# Patient Record
Sex: Male | Born: 1940 | Hispanic: No | Marital: Single | Smoking: Never smoker
Health system: Southern US, Community
[De-identification: ages and names within clinical notes are randomized; demographics above are authoritative.]

## PROBLEM LIST (undated history)

## (undated) DIAGNOSIS — K219 Gastro-esophageal reflux disease without esophagitis: Secondary | ICD-10-CM

## (undated) DIAGNOSIS — D649 Anemia, unspecified: Secondary | ICD-10-CM

## (undated) DIAGNOSIS — N289 Disorder of kidney and ureter, unspecified: Secondary | ICD-10-CM

## (undated) DIAGNOSIS — I499 Cardiac arrhythmia, unspecified: Secondary | ICD-10-CM

## (undated) DIAGNOSIS — C801 Malignant (primary) neoplasm, unspecified: Secondary | ICD-10-CM

## (undated) DIAGNOSIS — N319 Neuromuscular dysfunction of bladder, unspecified: Secondary | ICD-10-CM

## (undated) DIAGNOSIS — N189 Chronic kidney disease, unspecified: Secondary | ICD-10-CM

## (undated) HISTORY — PX: SUPRAPUBIC CATHETER PLACEMENT: SHX2473

## (undated) HISTORY — PX: CYSTOURETHROSCOPY: SHX476

---

## 2008-08-20 ENCOUNTER — Ambulatory Visit (HOSPITAL_BASED_OUTPATIENT_CLINIC_OR_DEPARTMENT_OTHER): Admission: RE | Admit: 2008-08-20 | Discharge: 2008-08-20 | Payer: Self-pay | Admitting: Urology

## 2010-03-28 ENCOUNTER — Ambulatory Visit (INDEPENDENT_AMBULATORY_CARE_PROVIDER_SITE_OTHER): Payer: Medicare Other | Admitting: Urology

## 2010-03-28 DIAGNOSIS — N319 Neuromuscular dysfunction of bladder, unspecified: Secondary | ICD-10-CM

## 2010-04-28 ENCOUNTER — Ambulatory Visit (INDEPENDENT_AMBULATORY_CARE_PROVIDER_SITE_OTHER): Payer: Medicare Other | Admitting: Urology

## 2010-04-28 DIAGNOSIS — N319 Neuromuscular dysfunction of bladder, unspecified: Secondary | ICD-10-CM

## 2010-05-07 LAB — POCT I-STAT, CHEM 8
BUN: 44 mg/dL — ABNORMAL HIGH (ref 6–23)
Calcium, Ion: 1.21 mmol/L (ref 1.12–1.32)
Chloride: 116 mEq/L — ABNORMAL HIGH (ref 96–112)
Creatinine, Ser: 2.8 mg/dL — ABNORMAL HIGH (ref 0.4–1.5)
TCO2: 19 mmol/L (ref 0–100)

## 2010-06-13 NOTE — Op Note (Signed)
NAMESUNAY, CHOCK              ACCOUNT NO.:  [id number]   MEDICAL RECORD NO.:  [id number]          PATIENT TYPE:  AMB   LOCATION:  [facility]                         FACILITY:  [facility]   PHYSICIAN:  Ronald L. Rosana Hoes, M.D.  DATE OF BIRTH:  11/01/1940   DATE OF PROCEDURE:  08/17/2008  DATE OF DISCHARGE:                               OPERATIVE REPORT   PREOPERATIVE DIAGNOSIS:  Neurogenic bladder, chronic renal  insufficiency.   POSTOPERATIVE DIAGNOSIS:  Neurogenic bladder, chronic renal  insufficiency.   PROCEDURE PERFORMED:  1. Cystourethroscopy.  2. Suprapubic tube placement.   SURGEON:  Dr. Tresa Endo.   RESIDENT:  Lia Hopping.   ANESTHESIA:  General endotracheal anesthesia.   FINDINGS:  The patient had evidence of neurogenic bladder with a large  capacity and heavy trabeculations.   SPECIMENS:  None.   DRAINS:  A 22-French suprapubic tube to gravity.   ESTIMATED BLOOD LOSS:  Minimal.   COMPLICATIONS:  None.   INDICATIONS FOR PROCEDURE:  Glenn Olson is a very pleasant 70 year old  Caucasian male who had a history of neurogenic bladder and has been on  intermittent self-catheterization in the past; however, he was found to  have hydronephrosis and a history of renal insufficiency.  His  creatinine was 3.39 and decreased to 2.9 after having a Foley catheter  in place.  He was counseled on all of his different options and elected  to undergo suprapubic tube placement for long-term management of his  neurogenic bladder.   DESCRIPTION OF PROCEDURE IN DETAIL:  Mr. Ringstaff was brought to the  operating room and correctly identified via his wristband.  A  preoperative time-out was called to confirm correct patient, procedure,  and site.  After successful induction of general anesthesia, he was  positioned in the dorsolithotomy position and great efforts were made to  reduce pressure on bony prominences.  IV antibiotics were administered.  He was placed in steep  Trendelenburg position.   A 22-French cystoscope cystoscopic sheath was used to insert a 12-  degrees lens transurethrally into the bladder, and pan cystoscopy was  performed.  The antrum and posterior urethra were normal in appearance.  The posterior urethra did show evidence of trilobar prostatic  enlargement.  The bladder was heavily trabeculated.  There were no  lesions, stones, or foreign bodies.  There were several scattered  cellules and small diverticula noted.  Bilateral ureteral orifices were  noted in the usual anatomic position and seen to be effluxing clear  yellow urine.  There was some debris flowing within the bladder, which  was irrigated to clear.  Patient's bladder was then filled to a good  capacity.  Cystoscope was removed.   The Lowsley retractor was inserted transurethrally into the patient's  bladder.  Then palpated the suprapubic region and cut down directly onto  the tip of the Lowsley retractor.  The Lowsley retractor was then  advanced through the bladder, subcutaneous tissue, and out.  Using 0  silk, a catheter was tied to the Lowsley retractor, which was then  retracted out through the patient's urethra.  The catheter was then  guided back into the bladder under cystoscopic guidance and confirmed  accurate placement within the bladder.  The balloon was then inflated  with 10 mL of sterile water.  The patient's bladder was emptied. A 0  silk was used to secure the catheter to the abdominal wall.  Sterile  dressing was applied, and the procedure was terminated.   Please note Dr. Lawerance Bach was present and available and participated in  all aspects of this patient's care.   DISPOSITION:  Patient tolerated the procedure well.  He was extubated  and transported to the PACU in stable condition.      Magnus Ivan, MD      Duane Lope. Rosana Hoes, M.D.  Electronically Signed    DW/MEDQ  D:  08/20/2008  T:  08/20/2008  Job:  [id number]

## 2011-01-11 ENCOUNTER — Other Ambulatory Visit (HOSPITAL_COMMUNITY): Payer: Self-pay | Admitting: Family Medicine

## 2011-01-11 ENCOUNTER — Ambulatory Visit (HOSPITAL_COMMUNITY)
Admission: RE | Admit: 2011-01-11 | Discharge: 2011-01-11 | Disposition: A | Discharge status: Home / Self Care | Payer: Medicare Other | Source: Ambulatory Visit | Attending: Family Medicine | Admitting: Family Medicine

## 2011-01-11 DIAGNOSIS — M549 Dorsalgia, unspecified: Secondary | ICD-10-CM

## 2011-01-11 DIAGNOSIS — R05 Cough: Secondary | ICD-10-CM

## 2011-01-11 DIAGNOSIS — R059 Cough, unspecified: Secondary | ICD-10-CM | POA: Insufficient documentation

## 2011-01-11 DIAGNOSIS — Z85038 Personal history of other malignant neoplasm of large intestine: Secondary | ICD-10-CM | POA: Insufficient documentation

## 2011-02-09 DIAGNOSIS — N323 Diverticulum of bladder: Secondary | ICD-10-CM | POA: Diagnosis not present

## 2011-02-09 DIAGNOSIS — N319 Neuromuscular dysfunction of bladder, unspecified: Secondary | ICD-10-CM | POA: Diagnosis not present

## 2011-03-28 DIAGNOSIS — N3020 Other chronic cystitis without hematuria: Secondary | ICD-10-CM | POA: Diagnosis not present

## 2011-05-29 DIAGNOSIS — N319 Neuromuscular dysfunction of bladder, unspecified: Secondary | ICD-10-CM | POA: Diagnosis not present

## 2011-06-08 DIAGNOSIS — N183 Chronic kidney disease, stage 3 unspecified: Secondary | ICD-10-CM | POA: Diagnosis not present

## 2011-06-08 DIAGNOSIS — I10 Essential (primary) hypertension: Secondary | ICD-10-CM | POA: Diagnosis not present

## 2011-06-08 DIAGNOSIS — N2581 Secondary hyperparathyroidism of renal origin: Secondary | ICD-10-CM | POA: Diagnosis not present

## 2011-06-08 DIAGNOSIS — D649 Anemia, unspecified: Secondary | ICD-10-CM | POA: Diagnosis not present

## 2011-06-08 DIAGNOSIS — I129 Hypertensive chronic kidney disease with stage 1 through stage 4 chronic kidney disease, or unspecified chronic kidney disease: Secondary | ICD-10-CM | POA: Diagnosis not present

## 2011-07-27 DIAGNOSIS — N319 Neuromuscular dysfunction of bladder, unspecified: Secondary | ICD-10-CM | POA: Diagnosis not present

## 2011-09-28 DIAGNOSIS — N319 Neuromuscular dysfunction of bladder, unspecified: Secondary | ICD-10-CM | POA: Diagnosis not present

## 2011-11-22 DIAGNOSIS — I10 Essential (primary) hypertension: Secondary | ICD-10-CM | POA: Diagnosis not present

## 2011-11-22 DIAGNOSIS — N401 Enlarged prostate with lower urinary tract symptoms: Secondary | ICD-10-CM | POA: Diagnosis not present

## 2011-11-22 DIAGNOSIS — E785 Hyperlipidemia, unspecified: Secondary | ICD-10-CM | POA: Diagnosis not present

## 2011-11-22 DIAGNOSIS — E782 Mixed hyperlipidemia: Secondary | ICD-10-CM | POA: Diagnosis not present

## 2011-11-22 DIAGNOSIS — R7989 Other specified abnormal findings of blood chemistry: Secondary | ICD-10-CM | POA: Diagnosis not present

## 2011-11-28 DIAGNOSIS — N319 Neuromuscular dysfunction of bladder, unspecified: Secondary | ICD-10-CM | POA: Diagnosis not present

## 2011-11-29 ENCOUNTER — Encounter (INDEPENDENT_AMBULATORY_CARE_PROVIDER_SITE_OTHER): Payer: Self-pay | Admitting: *Deleted

## 2011-12-20 DIAGNOSIS — Z23 Encounter for immunization: Secondary | ICD-10-CM | POA: Diagnosis not present

## 2012-01-09 ENCOUNTER — Encounter (INDEPENDENT_AMBULATORY_CARE_PROVIDER_SITE_OTHER): Payer: Self-pay | Admitting: *Deleted

## 2012-01-09 ENCOUNTER — Telehealth (INDEPENDENT_AMBULATORY_CARE_PROVIDER_SITE_OTHER): Payer: Self-pay | Admitting: *Deleted

## 2012-01-09 ENCOUNTER — Other Ambulatory Visit (INDEPENDENT_AMBULATORY_CARE_PROVIDER_SITE_OTHER): Payer: Self-pay | Admitting: *Deleted

## 2012-01-09 DIAGNOSIS — Z1211 Encounter for screening for malignant neoplasm of colon: Secondary | ICD-10-CM

## 2012-01-09 DIAGNOSIS — Z85038 Personal history of other malignant neoplasm of large intestine: Secondary | ICD-10-CM

## 2012-01-09 MED ORDER — PEG-KCL-NACL-NASULF-NA ASC-C 100 G PO SOLR: 1.0000 | Freq: Once | ORAL | Status: DC

## 2012-01-09 NOTE — Telephone Encounter (Signed)
Patient needs movi prep 

## 2012-02-01 DIAGNOSIS — N319 Neuromuscular dysfunction of bladder, unspecified: Secondary | ICD-10-CM | POA: Diagnosis not present

## 2012-02-01 DIAGNOSIS — N323 Diverticulum of bladder: Secondary | ICD-10-CM | POA: Diagnosis not present

## 2012-02-01 DIAGNOSIS — R972 Elevated prostate specific antigen [PSA]: Secondary | ICD-10-CM | POA: Diagnosis not present

## 2012-02-27 DIAGNOSIS — M109 Gout, unspecified: Secondary | ICD-10-CM | POA: Diagnosis not present

## 2012-02-27 DIAGNOSIS — M25569 Pain in unspecified knee: Secondary | ICD-10-CM | POA: Diagnosis not present

## 2012-03-06 ENCOUNTER — Ambulatory Visit: Admit: 2012-03-06 | Payer: Self-pay | Admitting: Internal Medicine

## 2012-03-06 SURGERY — COLONOSCOPY
Anesthesia: Moderate Sedation

## 2012-05-22 DIAGNOSIS — R7989 Other specified abnormal findings of blood chemistry: Secondary | ICD-10-CM | POA: Diagnosis not present

## 2012-05-22 DIAGNOSIS — N401 Enlarged prostate with lower urinary tract symptoms: Secondary | ICD-10-CM | POA: Diagnosis not present

## 2012-05-22 DIAGNOSIS — I10 Essential (primary) hypertension: Secondary | ICD-10-CM | POA: Diagnosis not present

## 2012-05-22 DIAGNOSIS — E785 Hyperlipidemia, unspecified: Secondary | ICD-10-CM | POA: Diagnosis not present

## 2012-05-22 DIAGNOSIS — E782 Mixed hyperlipidemia: Secondary | ICD-10-CM | POA: Diagnosis not present

## 2012-05-23 DIAGNOSIS — N319 Neuromuscular dysfunction of bladder, unspecified: Secondary | ICD-10-CM | POA: Diagnosis not present

## 2012-06-10 DIAGNOSIS — N184 Chronic kidney disease, stage 4 (severe): Secondary | ICD-10-CM | POA: Diagnosis not present

## 2012-06-10 DIAGNOSIS — N2581 Secondary hyperparathyroidism of renal origin: Secondary | ICD-10-CM | POA: Diagnosis not present

## 2012-06-10 DIAGNOSIS — D649 Anemia, unspecified: Secondary | ICD-10-CM | POA: Diagnosis not present

## 2012-07-25 DIAGNOSIS — N319 Neuromuscular dysfunction of bladder, unspecified: Secondary | ICD-10-CM | POA: Diagnosis not present

## 2012-09-26 DIAGNOSIS — N319 Neuromuscular dysfunction of bladder, unspecified: Secondary | ICD-10-CM | POA: Diagnosis not present

## 2012-10-16 DIAGNOSIS — D235 Other benign neoplasm of skin of trunk: Secondary | ICD-10-CM | POA: Diagnosis not present

## 2012-10-16 DIAGNOSIS — D485 Neoplasm of uncertain behavior of skin: Secondary | ICD-10-CM | POA: Diagnosis not present

## 2012-10-16 DIAGNOSIS — C44519 Basal cell carcinoma of skin of other part of trunk: Secondary | ICD-10-CM | POA: Diagnosis not present

## 2012-10-16 DIAGNOSIS — L98499 Non-pressure chronic ulcer of skin of other sites with unspecified severity: Secondary | ICD-10-CM | POA: Diagnosis not present

## 2012-10-16 DIAGNOSIS — L570 Actinic keratosis: Secondary | ICD-10-CM | POA: Diagnosis not present

## 2012-11-12 DIAGNOSIS — Z23 Encounter for immunization: Secondary | ICD-10-CM | POA: Diagnosis not present

## 2012-11-28 DIAGNOSIS — N319 Neuromuscular dysfunction of bladder, unspecified: Secondary | ICD-10-CM | POA: Diagnosis not present

## 2012-12-22 DIAGNOSIS — Z0000 Encounter for general adult medical examination without abnormal findings: Secondary | ICD-10-CM | POA: Diagnosis not present

## 2012-12-22 DIAGNOSIS — I10 Essential (primary) hypertension: Secondary | ICD-10-CM | POA: Diagnosis not present

## 2012-12-22 DIAGNOSIS — M109 Gout, unspecified: Secondary | ICD-10-CM | POA: Diagnosis not present

## 2012-12-22 DIAGNOSIS — E782 Mixed hyperlipidemia: Secondary | ICD-10-CM | POA: Diagnosis not present

## 2012-12-22 DIAGNOSIS — E785 Hyperlipidemia, unspecified: Secondary | ICD-10-CM | POA: Diagnosis not present

## 2013-01-27 DIAGNOSIS — N319 Neuromuscular dysfunction of bladder, unspecified: Secondary | ICD-10-CM | POA: Diagnosis not present

## 2013-03-25 DIAGNOSIS — N319 Neuromuscular dysfunction of bladder, unspecified: Secondary | ICD-10-CM | POA: Diagnosis not present

## 2013-03-25 DIAGNOSIS — R972 Elevated prostate specific antigen [PSA]: Secondary | ICD-10-CM | POA: Diagnosis not present

## 2013-03-25 DIAGNOSIS — N323 Diverticulum of bladder: Secondary | ICD-10-CM | POA: Diagnosis not present

## 2013-05-26 DIAGNOSIS — N319 Neuromuscular dysfunction of bladder, unspecified: Secondary | ICD-10-CM | POA: Diagnosis not present

## 2013-06-04 DIAGNOSIS — D485 Neoplasm of uncertain behavior of skin: Secondary | ICD-10-CM | POA: Diagnosis not present

## 2013-06-04 DIAGNOSIS — L988 Other specified disorders of the skin and subcutaneous tissue: Secondary | ICD-10-CM | POA: Diagnosis not present

## 2013-06-04 DIAGNOSIS — Z85828 Personal history of other malignant neoplasm of skin: Secondary | ICD-10-CM | POA: Diagnosis not present

## 2013-06-04 DIAGNOSIS — C44519 Basal cell carcinoma of skin of other part of trunk: Secondary | ICD-10-CM | POA: Diagnosis not present

## 2013-06-04 DIAGNOSIS — L570 Actinic keratosis: Secondary | ICD-10-CM | POA: Diagnosis not present

## 2013-06-04 DIAGNOSIS — L723 Sebaceous cyst: Secondary | ICD-10-CM | POA: Diagnosis not present

## 2013-06-18 DIAGNOSIS — I10 Essential (primary) hypertension: Secondary | ICD-10-CM | POA: Diagnosis not present

## 2013-06-18 DIAGNOSIS — E782 Mixed hyperlipidemia: Secondary | ICD-10-CM | POA: Diagnosis not present

## 2013-06-18 DIAGNOSIS — R7301 Impaired fasting glucose: Secondary | ICD-10-CM | POA: Diagnosis not present

## 2013-06-23 DIAGNOSIS — E782 Mixed hyperlipidemia: Secondary | ICD-10-CM | POA: Diagnosis not present

## 2013-06-23 DIAGNOSIS — M109 Gout, unspecified: Secondary | ICD-10-CM | POA: Diagnosis not present

## 2013-06-23 DIAGNOSIS — N138 Other obstructive and reflux uropathy: Secondary | ICD-10-CM | POA: Diagnosis not present

## 2013-06-23 DIAGNOSIS — N401 Enlarged prostate with lower urinary tract symptoms: Secondary | ICD-10-CM | POA: Diagnosis not present

## 2013-06-23 DIAGNOSIS — I10 Essential (primary) hypertension: Secondary | ICD-10-CM | POA: Diagnosis not present

## 2013-07-06 DIAGNOSIS — Z85828 Personal history of other malignant neoplasm of skin: Secondary | ICD-10-CM | POA: Diagnosis not present

## 2013-07-06 DIAGNOSIS — L723 Sebaceous cyst: Secondary | ICD-10-CM | POA: Diagnosis not present

## 2013-07-07 DIAGNOSIS — N039 Chronic nephritic syndrome with unspecified morphologic changes: Secondary | ICD-10-CM | POA: Diagnosis not present

## 2013-07-07 DIAGNOSIS — N2581 Secondary hyperparathyroidism of renal origin: Secondary | ICD-10-CM | POA: Diagnosis not present

## 2013-07-07 DIAGNOSIS — D631 Anemia in chronic kidney disease: Secondary | ICD-10-CM | POA: Diagnosis not present

## 2013-07-07 DIAGNOSIS — N183 Chronic kidney disease, stage 3 unspecified: Secondary | ICD-10-CM | POA: Diagnosis not present

## 2013-07-21 DIAGNOSIS — N319 Neuromuscular dysfunction of bladder, unspecified: Secondary | ICD-10-CM | POA: Diagnosis not present

## 2013-09-22 DIAGNOSIS — N319 Neuromuscular dysfunction of bladder, unspecified: Secondary | ICD-10-CM | POA: Diagnosis not present

## 2013-11-03 DIAGNOSIS — Z23 Encounter for immunization: Secondary | ICD-10-CM | POA: Diagnosis not present

## 2013-11-27 DIAGNOSIS — N319 Neuromuscular dysfunction of bladder, unspecified: Secondary | ICD-10-CM | POA: Diagnosis not present

## 2013-12-18 DIAGNOSIS — E785 Hyperlipidemia, unspecified: Secondary | ICD-10-CM | POA: Diagnosis not present

## 2013-12-18 DIAGNOSIS — I10 Essential (primary) hypertension: Secondary | ICD-10-CM | POA: Diagnosis not present

## 2013-12-22 DIAGNOSIS — I10 Essential (primary) hypertension: Secondary | ICD-10-CM | POA: Diagnosis not present

## 2013-12-22 DIAGNOSIS — N184 Chronic kidney disease, stage 4 (severe): Secondary | ICD-10-CM | POA: Diagnosis not present

## 2013-12-22 DIAGNOSIS — E782 Mixed hyperlipidemia: Secondary | ICD-10-CM | POA: Diagnosis not present

## 2014-01-26 DIAGNOSIS — N319 Neuromuscular dysfunction of bladder, unspecified: Secondary | ICD-10-CM | POA: Diagnosis not present

## 2014-02-18 DIAGNOSIS — K5730 Diverticulosis of large intestine without perforation or abscess without bleeding: Secondary | ICD-10-CM | POA: Diagnosis not present

## 2014-02-18 DIAGNOSIS — D124 Benign neoplasm of descending colon: Secondary | ICD-10-CM | POA: Diagnosis not present

## 2014-02-18 DIAGNOSIS — Z85038 Personal history of other malignant neoplasm of large intestine: Secondary | ICD-10-CM | POA: Diagnosis not present

## 2014-04-02 DIAGNOSIS — N319 Neuromuscular dysfunction of bladder, unspecified: Secondary | ICD-10-CM | POA: Diagnosis not present

## 2014-04-02 DIAGNOSIS — N323 Diverticulum of bladder: Secondary | ICD-10-CM | POA: Diagnosis not present

## 2014-05-25 ENCOUNTER — Ambulatory Visit (INDEPENDENT_AMBULATORY_CARE_PROVIDER_SITE_OTHER): Payer: Medicare Other | Admitting: Urology

## 2014-05-25 DIAGNOSIS — N323 Diverticulum of bladder: Secondary | ICD-10-CM

## 2014-05-25 DIAGNOSIS — N319 Neuromuscular dysfunction of bladder, unspecified: Secondary | ICD-10-CM | POA: Diagnosis not present

## 2014-06-24 DIAGNOSIS — E782 Mixed hyperlipidemia: Secondary | ICD-10-CM | POA: Diagnosis not present

## 2014-06-24 DIAGNOSIS — Z125 Encounter for screening for malignant neoplasm of prostate: Secondary | ICD-10-CM | POA: Diagnosis not present

## 2014-07-01 DIAGNOSIS — E782 Mixed hyperlipidemia: Secondary | ICD-10-CM | POA: Diagnosis not present

## 2014-07-01 DIAGNOSIS — N184 Chronic kidney disease, stage 4 (severe): Secondary | ICD-10-CM | POA: Diagnosis not present

## 2014-07-01 DIAGNOSIS — D631 Anemia in chronic kidney disease: Secondary | ICD-10-CM | POA: Diagnosis not present

## 2014-07-01 DIAGNOSIS — M10062 Idiopathic gout, left knee: Secondary | ICD-10-CM | POA: Diagnosis not present

## 2014-07-27 ENCOUNTER — Ambulatory Visit (INDEPENDENT_AMBULATORY_CARE_PROVIDER_SITE_OTHER): Payer: Medicare Other | Admitting: Urology

## 2014-07-27 DIAGNOSIS — N319 Neuromuscular dysfunction of bladder, unspecified: Secondary | ICD-10-CM

## 2014-07-27 DIAGNOSIS — N323 Diverticulum of bladder: Secondary | ICD-10-CM | POA: Diagnosis not present

## 2014-07-30 DIAGNOSIS — N189 Chronic kidney disease, unspecified: Secondary | ICD-10-CM | POA: Diagnosis not present

## 2014-07-30 DIAGNOSIS — D631 Anemia in chronic kidney disease: Secondary | ICD-10-CM | POA: Diagnosis not present

## 2014-07-30 DIAGNOSIS — N183 Chronic kidney disease, stage 3 (moderate): Secondary | ICD-10-CM | POA: Diagnosis not present

## 2014-07-30 DIAGNOSIS — N2581 Secondary hyperparathyroidism of renal origin: Secondary | ICD-10-CM | POA: Diagnosis not present

## 2014-08-11 DIAGNOSIS — D631 Anemia in chronic kidney disease: Secondary | ICD-10-CM | POA: Diagnosis not present

## 2014-08-19 DIAGNOSIS — N183 Chronic kidney disease, stage 3 (moderate): Secondary | ICD-10-CM | POA: Diagnosis not present

## 2014-09-28 ENCOUNTER — Ambulatory Visit (INDEPENDENT_AMBULATORY_CARE_PROVIDER_SITE_OTHER): Payer: Medicare Other | Admitting: Urology

## 2014-09-28 DIAGNOSIS — N323 Diverticulum of bladder: Secondary | ICD-10-CM

## 2014-09-28 DIAGNOSIS — N319 Neuromuscular dysfunction of bladder, unspecified: Secondary | ICD-10-CM

## 2014-09-30 DIAGNOSIS — M1000 Idiopathic gout, unspecified site: Secondary | ICD-10-CM | POA: Diagnosis not present

## 2014-09-30 DIAGNOSIS — R339 Retention of urine, unspecified: Secondary | ICD-10-CM | POA: Diagnosis not present

## 2014-09-30 DIAGNOSIS — D631 Anemia in chronic kidney disease: Secondary | ICD-10-CM | POA: Diagnosis not present

## 2014-09-30 DIAGNOSIS — D649 Anemia, unspecified: Secondary | ICD-10-CM | POA: Diagnosis not present

## 2014-09-30 DIAGNOSIS — N184 Chronic kidney disease, stage 4 (severe): Secondary | ICD-10-CM | POA: Diagnosis not present

## 2014-11-23 ENCOUNTER — Ambulatory Visit (INDEPENDENT_AMBULATORY_CARE_PROVIDER_SITE_OTHER): Payer: Medicare Other | Admitting: Urology

## 2014-11-23 DIAGNOSIS — Z23 Encounter for immunization: Secondary | ICD-10-CM | POA: Diagnosis not present

## 2014-11-23 DIAGNOSIS — R339 Retention of urine, unspecified: Secondary | ICD-10-CM

## 2014-12-07 DIAGNOSIS — D631 Anemia in chronic kidney disease: Secondary | ICD-10-CM | POA: Diagnosis not present

## 2014-12-07 DIAGNOSIS — E782 Mixed hyperlipidemia: Secondary | ICD-10-CM | POA: Diagnosis not present

## 2014-12-07 DIAGNOSIS — N184 Chronic kidney disease, stage 4 (severe): Secondary | ICD-10-CM | POA: Diagnosis not present

## 2014-12-07 DIAGNOSIS — I10 Essential (primary) hypertension: Secondary | ICD-10-CM | POA: Diagnosis not present

## 2014-12-09 DIAGNOSIS — N184 Chronic kidney disease, stage 4 (severe): Secondary | ICD-10-CM | POA: Diagnosis not present

## 2014-12-09 DIAGNOSIS — K219 Gastro-esophageal reflux disease without esophagitis: Secondary | ICD-10-CM | POA: Diagnosis not present

## 2014-12-09 DIAGNOSIS — E790 Hyperuricemia without signs of inflammatory arthritis and tophaceous disease: Secondary | ICD-10-CM | POA: Diagnosis not present

## 2014-12-09 DIAGNOSIS — R339 Retention of urine, unspecified: Secondary | ICD-10-CM | POA: Diagnosis not present

## 2014-12-09 DIAGNOSIS — D509 Iron deficiency anemia, unspecified: Secondary | ICD-10-CM | POA: Diagnosis not present

## 2014-12-09 DIAGNOSIS — D010 Carcinoma in situ of colon: Secondary | ICD-10-CM | POA: Diagnosis not present

## 2014-12-09 DIAGNOSIS — E782 Mixed hyperlipidemia: Secondary | ICD-10-CM | POA: Diagnosis not present

## 2015-01-18 ENCOUNTER — Ambulatory Visit (INDEPENDENT_AMBULATORY_CARE_PROVIDER_SITE_OTHER): Payer: Medicare Other | Admitting: Urology

## 2015-01-18 DIAGNOSIS — N319 Neuromuscular dysfunction of bladder, unspecified: Secondary | ICD-10-CM | POA: Diagnosis not present

## 2015-01-18 DIAGNOSIS — N323 Diverticulum of bladder: Secondary | ICD-10-CM

## 2015-03-15 ENCOUNTER — Ambulatory Visit (INDEPENDENT_AMBULATORY_CARE_PROVIDER_SITE_OTHER): Payer: Medicare Other | Admitting: Urology

## 2015-03-15 DIAGNOSIS — N323 Diverticulum of bladder: Secondary | ICD-10-CM | POA: Diagnosis not present

## 2015-03-15 DIAGNOSIS — N312 Flaccid neuropathic bladder, not elsewhere classified: Secondary | ICD-10-CM | POA: Diagnosis not present

## 2015-05-24 ENCOUNTER — Ambulatory Visit (INDEPENDENT_AMBULATORY_CARE_PROVIDER_SITE_OTHER): Payer: Medicare Other | Admitting: Urology

## 2015-05-24 ENCOUNTER — Ambulatory Visit: Payer: Medicare Other | Admitting: Urology

## 2015-05-24 DIAGNOSIS — N312 Flaccid neuropathic bladder, not elsewhere classified: Secondary | ICD-10-CM | POA: Diagnosis not present

## 2015-05-24 DIAGNOSIS — N323 Diverticulum of bladder: Secondary | ICD-10-CM

## 2015-05-31 DIAGNOSIS — R7301 Impaired fasting glucose: Secondary | ICD-10-CM | POA: Diagnosis not present

## 2015-05-31 DIAGNOSIS — I10 Essential (primary) hypertension: Secondary | ICD-10-CM | POA: Diagnosis not present

## 2015-05-31 DIAGNOSIS — E782 Mixed hyperlipidemia: Secondary | ICD-10-CM | POA: Diagnosis not present

## 2015-06-02 DIAGNOSIS — N184 Chronic kidney disease, stage 4 (severe): Secondary | ICD-10-CM | POA: Diagnosis not present

## 2015-06-02 DIAGNOSIS — E782 Mixed hyperlipidemia: Secondary | ICD-10-CM | POA: Diagnosis not present

## 2015-06-02 DIAGNOSIS — K219 Gastro-esophageal reflux disease without esophagitis: Secondary | ICD-10-CM | POA: Diagnosis not present

## 2015-06-02 DIAGNOSIS — I10 Essential (primary) hypertension: Secondary | ICD-10-CM | POA: Diagnosis not present

## 2015-06-13 DIAGNOSIS — Z1283 Encounter for screening for malignant neoplasm of skin: Secondary | ICD-10-CM | POA: Diagnosis not present

## 2015-06-13 DIAGNOSIS — D225 Melanocytic nevi of trunk: Secondary | ICD-10-CM | POA: Diagnosis not present

## 2015-06-13 DIAGNOSIS — X32XXXD Exposure to sunlight, subsequent encounter: Secondary | ICD-10-CM | POA: Diagnosis not present

## 2015-06-13 DIAGNOSIS — L570 Actinic keratosis: Secondary | ICD-10-CM | POA: Diagnosis not present

## 2015-06-13 DIAGNOSIS — C44212 Basal cell carcinoma of skin of right ear and external auricular canal: Secondary | ICD-10-CM | POA: Diagnosis not present

## 2015-06-13 DIAGNOSIS — C44519 Basal cell carcinoma of skin of other part of trunk: Secondary | ICD-10-CM | POA: Diagnosis not present

## 2015-07-25 DIAGNOSIS — Z08 Encounter for follow-up examination after completed treatment for malignant neoplasm: Secondary | ICD-10-CM | POA: Diagnosis not present

## 2015-07-25 DIAGNOSIS — Z85828 Personal history of other malignant neoplasm of skin: Secondary | ICD-10-CM | POA: Diagnosis not present

## 2015-07-29 ENCOUNTER — Ambulatory Visit (INDEPENDENT_AMBULATORY_CARE_PROVIDER_SITE_OTHER): Payer: Medicare Other | Admitting: Urology

## 2015-07-29 DIAGNOSIS — N323 Diverticulum of bladder: Secondary | ICD-10-CM

## 2015-07-29 DIAGNOSIS — N312 Flaccid neuropathic bladder, not elsewhere classified: Secondary | ICD-10-CM

## 2015-09-23 ENCOUNTER — Ambulatory Visit (INDEPENDENT_AMBULATORY_CARE_PROVIDER_SITE_OTHER): Payer: Medicare Other | Admitting: Urology

## 2015-09-23 ENCOUNTER — Other Ambulatory Visit: Payer: Self-pay

## 2015-09-23 DIAGNOSIS — N323 Diverticulum of bladder: Secondary | ICD-10-CM

## 2015-09-23 DIAGNOSIS — N312 Flaccid neuropathic bladder, not elsewhere classified: Secondary | ICD-10-CM

## 2015-10-17 DIAGNOSIS — E669 Obesity, unspecified: Secondary | ICD-10-CM | POA: Diagnosis not present

## 2015-10-17 DIAGNOSIS — N189 Chronic kidney disease, unspecified: Secondary | ICD-10-CM | POA: Diagnosis not present

## 2015-10-17 DIAGNOSIS — N2581 Secondary hyperparathyroidism of renal origin: Secondary | ICD-10-CM | POA: Diagnosis not present

## 2015-10-17 DIAGNOSIS — N183 Chronic kidney disease, stage 3 (moderate): Secondary | ICD-10-CM | POA: Diagnosis not present

## 2015-10-17 DIAGNOSIS — D631 Anemia in chronic kidney disease: Secondary | ICD-10-CM | POA: Diagnosis not present

## 2015-11-18 ENCOUNTER — Ambulatory Visit (INDEPENDENT_AMBULATORY_CARE_PROVIDER_SITE_OTHER): Payer: Medicare Other | Admitting: Urology

## 2015-11-18 DIAGNOSIS — N312 Flaccid neuropathic bladder, not elsewhere classified: Secondary | ICD-10-CM

## 2015-11-18 DIAGNOSIS — N323 Diverticulum of bladder: Secondary | ICD-10-CM

## 2015-12-19 DIAGNOSIS — E119 Type 2 diabetes mellitus without complications: Secondary | ICD-10-CM | POA: Diagnosis not present

## 2015-12-19 DIAGNOSIS — E039 Hypothyroidism, unspecified: Secondary | ICD-10-CM | POA: Diagnosis not present

## 2015-12-19 DIAGNOSIS — I482 Chronic atrial fibrillation: Secondary | ICD-10-CM | POA: Diagnosis not present

## 2015-12-19 DIAGNOSIS — R7301 Impaired fasting glucose: Secondary | ICD-10-CM | POA: Diagnosis not present

## 2015-12-19 DIAGNOSIS — I10 Essential (primary) hypertension: Secondary | ICD-10-CM | POA: Diagnosis not present

## 2015-12-19 DIAGNOSIS — E782 Mixed hyperlipidemia: Secondary | ICD-10-CM | POA: Diagnosis not present

## 2015-12-19 DIAGNOSIS — E785 Hyperlipidemia, unspecified: Secondary | ICD-10-CM | POA: Diagnosis not present

## 2015-12-19 DIAGNOSIS — N529 Male erectile dysfunction, unspecified: Secondary | ICD-10-CM | POA: Diagnosis not present

## 2015-12-29 DIAGNOSIS — E782 Mixed hyperlipidemia: Secondary | ICD-10-CM | POA: Diagnosis not present

## 2015-12-29 DIAGNOSIS — I10 Essential (primary) hypertension: Secondary | ICD-10-CM | POA: Diagnosis not present

## 2015-12-29 DIAGNOSIS — R7301 Impaired fasting glucose: Secondary | ICD-10-CM | POA: Diagnosis not present

## 2015-12-29 DIAGNOSIS — N184 Chronic kidney disease, stage 4 (severe): Secondary | ICD-10-CM | POA: Diagnosis not present

## 2016-01-13 DIAGNOSIS — Z23 Encounter for immunization: Secondary | ICD-10-CM | POA: Diagnosis not present

## 2016-01-27 ENCOUNTER — Ambulatory Visit (INDEPENDENT_AMBULATORY_CARE_PROVIDER_SITE_OTHER): Payer: Medicare Other | Admitting: Urology

## 2016-01-27 DIAGNOSIS — N323 Diverticulum of bladder: Secondary | ICD-10-CM

## 2016-01-27 DIAGNOSIS — N312 Flaccid neuropathic bladder, not elsewhere classified: Secondary | ICD-10-CM

## 2016-02-20 DIAGNOSIS — D225 Melanocytic nevi of trunk: Secondary | ICD-10-CM | POA: Diagnosis not present

## 2016-02-20 DIAGNOSIS — Z08 Encounter for follow-up examination after completed treatment for malignant neoplasm: Secondary | ICD-10-CM | POA: Diagnosis not present

## 2016-02-20 DIAGNOSIS — X32XXXD Exposure to sunlight, subsequent encounter: Secondary | ICD-10-CM | POA: Diagnosis not present

## 2016-02-20 DIAGNOSIS — Z1283 Encounter for screening for malignant neoplasm of skin: Secondary | ICD-10-CM | POA: Diagnosis not present

## 2016-02-20 DIAGNOSIS — L858 Other specified epidermal thickening: Secondary | ICD-10-CM | POA: Diagnosis not present

## 2016-02-20 DIAGNOSIS — Z85828 Personal history of other malignant neoplasm of skin: Secondary | ICD-10-CM | POA: Diagnosis not present

## 2016-02-20 DIAGNOSIS — D485 Neoplasm of uncertain behavior of skin: Secondary | ICD-10-CM | POA: Diagnosis not present

## 2016-02-20 DIAGNOSIS — L570 Actinic keratosis: Secondary | ICD-10-CM | POA: Diagnosis not present

## 2016-04-03 ENCOUNTER — Ambulatory Visit (INDEPENDENT_AMBULATORY_CARE_PROVIDER_SITE_OTHER): Payer: Medicare Other | Admitting: Urology

## 2016-04-03 DIAGNOSIS — N323 Diverticulum of bladder: Secondary | ICD-10-CM | POA: Diagnosis not present

## 2016-04-03 DIAGNOSIS — N311 Reflex neuropathic bladder, not elsewhere classified: Secondary | ICD-10-CM

## 2016-06-26 DIAGNOSIS — I10 Essential (primary) hypertension: Secondary | ICD-10-CM | POA: Diagnosis not present

## 2016-06-26 DIAGNOSIS — D509 Iron deficiency anemia, unspecified: Secondary | ICD-10-CM | POA: Diagnosis not present

## 2016-06-27 ENCOUNTER — Ambulatory Visit (INDEPENDENT_AMBULATORY_CARE_PROVIDER_SITE_OTHER): Payer: Medicare Other | Admitting: Urology

## 2016-06-27 DIAGNOSIS — N3020 Other chronic cystitis without hematuria: Secondary | ICD-10-CM

## 2016-06-28 DIAGNOSIS — R339 Retention of urine, unspecified: Secondary | ICD-10-CM | POA: Diagnosis not present

## 2016-06-28 DIAGNOSIS — E782 Mixed hyperlipidemia: Secondary | ICD-10-CM | POA: Diagnosis not present

## 2016-06-28 DIAGNOSIS — N184 Chronic kidney disease, stage 4 (severe): Secondary | ICD-10-CM | POA: Diagnosis not present

## 2016-06-28 DIAGNOSIS — R7301 Impaired fasting glucose: Secondary | ICD-10-CM | POA: Diagnosis not present

## 2016-06-28 DIAGNOSIS — D509 Iron deficiency anemia, unspecified: Secondary | ICD-10-CM | POA: Diagnosis not present

## 2016-07-13 DIAGNOSIS — N183 Chronic kidney disease, stage 3 (moderate): Secondary | ICD-10-CM | POA: Diagnosis not present

## 2016-08-28 ENCOUNTER — Ambulatory Visit (INDEPENDENT_AMBULATORY_CARE_PROVIDER_SITE_OTHER): Payer: Medicare Other | Admitting: Urology

## 2016-08-28 DIAGNOSIS — N3020 Other chronic cystitis without hematuria: Secondary | ICD-10-CM

## 2016-10-23 ENCOUNTER — Ambulatory Visit (INDEPENDENT_AMBULATORY_CARE_PROVIDER_SITE_OTHER): Payer: Medicare Other | Admitting: Urology

## 2016-10-23 DIAGNOSIS — N3020 Other chronic cystitis without hematuria: Secondary | ICD-10-CM

## 2016-10-24 DIAGNOSIS — Z23 Encounter for immunization: Secondary | ICD-10-CM | POA: Diagnosis not present

## 2016-10-24 DIAGNOSIS — N183 Chronic kidney disease, stage 3 (moderate): Secondary | ICD-10-CM | POA: Diagnosis not present

## 2016-10-24 DIAGNOSIS — N2581 Secondary hyperparathyroidism of renal origin: Secondary | ICD-10-CM | POA: Diagnosis not present

## 2016-10-24 DIAGNOSIS — D631 Anemia in chronic kidney disease: Secondary | ICD-10-CM | POA: Diagnosis not present

## 2016-10-24 DIAGNOSIS — E669 Obesity, unspecified: Secondary | ICD-10-CM | POA: Diagnosis not present

## 2016-11-27 DIAGNOSIS — E782 Mixed hyperlipidemia: Secondary | ICD-10-CM | POA: Diagnosis not present

## 2016-11-27 DIAGNOSIS — I10 Essential (primary) hypertension: Secondary | ICD-10-CM | POA: Diagnosis not present

## 2016-11-27 DIAGNOSIS — R7301 Impaired fasting glucose: Secondary | ICD-10-CM | POA: Diagnosis not present

## 2016-11-29 ENCOUNTER — Ambulatory Visit (HOSPITAL_COMMUNITY): Payer: Medicare Other

## 2016-11-29 ENCOUNTER — Other Ambulatory Visit (HOSPITAL_COMMUNITY): Payer: Medicare Other

## 2016-11-29 DIAGNOSIS — R7301 Impaired fasting glucose: Secondary | ICD-10-CM | POA: Diagnosis not present

## 2016-11-29 DIAGNOSIS — E782 Mixed hyperlipidemia: Secondary | ICD-10-CM | POA: Diagnosis not present

## 2016-11-29 DIAGNOSIS — D631 Anemia in chronic kidney disease: Secondary | ICD-10-CM | POA: Diagnosis not present

## 2016-11-29 DIAGNOSIS — R3981 Functional urinary incontinence: Secondary | ICD-10-CM | POA: Diagnosis not present

## 2016-11-29 DIAGNOSIS — N184 Chronic kidney disease, stage 4 (severe): Secondary | ICD-10-CM | POA: Diagnosis not present

## 2016-11-29 DIAGNOSIS — K219 Gastro-esophageal reflux disease without esophagitis: Secondary | ICD-10-CM | POA: Diagnosis not present

## 2016-11-30 ENCOUNTER — Encounter (HOSPITAL_COMMUNITY)
Admission: RE | Admit: 2016-11-30 | Discharge: 2016-11-30 | Disposition: A | Discharge status: Home / Self Care | Payer: Medicare Other | Source: Ambulatory Visit | Attending: Nephrology | Admitting: Nephrology

## 2016-11-30 DIAGNOSIS — N183 Chronic kidney disease, stage 3 (moderate): Secondary | ICD-10-CM | POA: Insufficient documentation

## 2016-11-30 DIAGNOSIS — D631 Anemia in chronic kidney disease: Secondary | ICD-10-CM | POA: Diagnosis not present

## 2016-11-30 LAB — IRON AND TIBC
Iron: 36 ug/dL — ABNORMAL LOW (ref 45–182)
Saturation Ratios: 8 % — ABNORMAL LOW (ref 17.9–39.5)
TIBC: 430 ug/dL (ref 250–450)
UIBC: 394 ug/dL

## 2016-11-30 LAB — FERRITIN: Ferritin: 18 ng/mL — ABNORMAL LOW (ref 24–336)

## 2016-11-30 LAB — POCT HEMOGLOBIN-HEMACUE: HEMOGLOBIN: 8.2 g/dL — AB (ref 13.0–17.0)

## 2016-11-30 MED ORDER — EPOETIN ALFA 2000 UNIT/ML IJ SOLN
INTRAMUSCULAR | Status: AC
  Filled 2016-11-30: qty 1

## 2016-11-30 MED ORDER — EPOETIN ALFA 2000 UNIT/ML IJ SOLN
2000.0000 [IU] | Freq: Once | INTRAMUSCULAR | Status: AC
  Administered 2016-11-30: 2000 [IU] via SUBCUTANEOUS

## 2016-11-30 MED ORDER — EPOETIN ALFA 3000 UNIT/ML IJ SOLN
INTRAMUSCULAR | Status: AC
  Filled 2016-11-30: qty 1

## 2016-11-30 MED ORDER — EPOETIN ALFA 3000 UNIT/ML IJ SOLN
3000.0000 [IU] | Freq: Once | INTRAMUSCULAR | Status: AC
  Administered 2016-11-30: 3000 [IU] via SUBCUTANEOUS

## 2016-12-18 ENCOUNTER — Encounter (HOSPITAL_COMMUNITY)
Admission: RE | Admit: 2016-12-18 | Discharge: 2016-12-18 | Disposition: A | Discharge status: Home / Self Care | Payer: Medicare Other | Source: Ambulatory Visit | Attending: Nephrology | Admitting: Nephrology

## 2016-12-18 ENCOUNTER — Ambulatory Visit (INDEPENDENT_AMBULATORY_CARE_PROVIDER_SITE_OTHER): Payer: Medicare Other | Admitting: Urology

## 2016-12-18 DIAGNOSIS — N183 Chronic kidney disease, stage 3 (moderate): Secondary | ICD-10-CM | POA: Diagnosis not present

## 2016-12-18 DIAGNOSIS — D631 Anemia in chronic kidney disease: Secondary | ICD-10-CM | POA: Diagnosis not present

## 2016-12-18 DIAGNOSIS — N3020 Other chronic cystitis without hematuria: Secondary | ICD-10-CM

## 2016-12-18 LAB — POCT HEMOGLOBIN-HEMACUE: Hemoglobin: 8.5 g/dL — ABNORMAL LOW (ref 13.0–17.0)

## 2016-12-18 MED ORDER — EPOETIN ALFA 3000 UNIT/ML IJ SOLN
3000.0000 [IU] | Freq: Once | INTRAMUSCULAR | Status: AC
  Administered 2016-12-18: 3000 [IU] via SUBCUTANEOUS

## 2016-12-18 MED ORDER — EPOETIN ALFA 2000 UNIT/ML IJ SOLN
INTRAMUSCULAR | Status: AC
  Filled 2016-12-18: qty 1

## 2016-12-18 MED ORDER — SODIUM CHLORIDE 0.9 % IV SOLN
INTRAVENOUS | Status: DC
  Administered 2016-12-18: 250 mL via INTRAVENOUS

## 2016-12-18 MED ORDER — EPOETIN ALFA 2000 UNIT/ML IJ SOLN
2000.0000 [IU] | Freq: Once | INTRAMUSCULAR | Status: AC
  Administered 2016-12-18: 2000 [IU] via SUBCUTANEOUS

## 2016-12-18 MED ORDER — EPOETIN ALFA 3000 UNIT/ML IJ SOLN
INTRAMUSCULAR | Status: AC
  Filled 2016-12-18: qty 1

## 2016-12-18 MED ORDER — SODIUM CHLORIDE 0.9 % IV SOLN
510.0000 mg | Freq: Once | INTRAVENOUS | Status: AC
  Administered 2016-12-18: 510 mg via INTRAVENOUS
  Filled 2016-12-18: qty 17

## 2016-12-25 ENCOUNTER — Encounter (HOSPITAL_COMMUNITY): Payer: Self-pay

## 2016-12-25 ENCOUNTER — Encounter (HOSPITAL_COMMUNITY)
Admission: RE | Admit: 2016-12-25 | Discharge: 2016-12-25 | Disposition: A | Discharge status: Home / Self Care | Payer: Medicare Other | Source: Ambulatory Visit | Attending: Nephrology | Admitting: Nephrology

## 2016-12-25 DIAGNOSIS — N183 Chronic kidney disease, stage 3 (moderate): Secondary | ICD-10-CM | POA: Diagnosis not present

## 2016-12-25 DIAGNOSIS — D631 Anemia in chronic kidney disease: Secondary | ICD-10-CM | POA: Diagnosis not present

## 2016-12-25 MED ORDER — SODIUM CHLORIDE 0.9 % IV SOLN
510.0000 mg | Freq: Once | INTRAVENOUS | Status: AC
  Administered 2016-12-25: 510 mg via INTRAVENOUS
  Filled 2016-12-25: qty 17

## 2016-12-25 MED ORDER — SODIUM CHLORIDE 0.9 % IV SOLN
INTRAVENOUS | Status: DC
  Administered 2016-12-25: 13:00:00 via INTRAVENOUS

## 2016-12-31 ENCOUNTER — Ambulatory Visit (HOSPITAL_COMMUNITY): Payer: Medicare Other

## 2016-12-31 ENCOUNTER — Other Ambulatory Visit (HOSPITAL_COMMUNITY): Payer: Medicare Other

## 2017-01-01 ENCOUNTER — Encounter (HOSPITAL_COMMUNITY): Payer: Self-pay

## 2017-01-01 ENCOUNTER — Encounter (HOSPITAL_COMMUNITY)
Admission: RE | Admit: 2017-01-01 | Discharge: 2017-01-01 | Disposition: A | Discharge status: Home / Self Care | Payer: Medicare Other | Source: Ambulatory Visit | Attending: Nephrology | Admitting: Nephrology

## 2017-01-01 DIAGNOSIS — D631 Anemia in chronic kidney disease: Secondary | ICD-10-CM | POA: Insufficient documentation

## 2017-01-01 DIAGNOSIS — N183 Chronic kidney disease, stage 3 (moderate): Secondary | ICD-10-CM | POA: Diagnosis not present

## 2017-01-01 LAB — IRON AND TIBC
IRON: 62 ug/dL (ref 45–182)
Saturation Ratios: 18 % (ref 17.9–39.5)
TIBC: 340 ug/dL (ref 250–450)
UIBC: 278 ug/dL

## 2017-01-01 LAB — FERRITIN: Ferritin: 153 ng/mL (ref 24–336)

## 2017-01-01 LAB — POCT HEMOGLOBIN-HEMACUE: HEMOGLOBIN: 11.3 g/dL — AB (ref 13.0–17.0)

## 2017-01-01 MED ORDER — EPOETIN ALFA 3000 UNIT/ML IJ SOLN
INTRAMUSCULAR | Status: AC
  Filled 2017-01-01: qty 1

## 2017-01-01 MED ORDER — EPOETIN ALFA 2000 UNIT/ML IJ SOLN
5000.0000 [IU] | Freq: Once | INTRAMUSCULAR | Status: AC
  Administered 2017-01-01: 5000 [IU] via SUBCUTANEOUS

## 2017-01-01 MED ORDER — EPOETIN ALFA 2000 UNIT/ML IJ SOLN
INTRAMUSCULAR | Status: AC
  Filled 2017-01-01: qty 1

## 2017-01-15 ENCOUNTER — Encounter (HOSPITAL_COMMUNITY)
Admission: RE | Admit: 2017-01-15 | Discharge: 2017-01-15 | Disposition: A | Discharge status: Home / Self Care | Payer: Medicare Other | Source: Ambulatory Visit | Attending: Nephrology | Admitting: Nephrology

## 2017-01-15 ENCOUNTER — Encounter (HOSPITAL_COMMUNITY): Payer: Self-pay

## 2017-01-15 DIAGNOSIS — D631 Anemia in chronic kidney disease: Secondary | ICD-10-CM | POA: Diagnosis not present

## 2017-01-15 DIAGNOSIS — N183 Chronic kidney disease, stage 3 (moderate): Secondary | ICD-10-CM | POA: Diagnosis not present

## 2017-01-15 LAB — POCT HEMOGLOBIN-HEMACUE: HEMOGLOBIN: 12.5 g/dL — AB (ref 13.0–17.0)

## 2017-01-15 MED ORDER — EPOETIN ALFA 3000 UNIT/ML IJ SOLN: 3000.0000 [IU] | Freq: Once | INTRAMUSCULAR | Status: DC

## 2017-01-15 MED ORDER — EPOETIN ALFA 2000 UNIT/ML IJ SOLN
2000.0000 [IU] | Freq: Once | INTRAMUSCULAR | Status: DC
Start: 2017-01-15 — End: 2017-01-16

## 2017-01-30 ENCOUNTER — Encounter (HOSPITAL_COMMUNITY): Payer: Self-pay

## 2017-01-30 ENCOUNTER — Encounter (HOSPITAL_COMMUNITY)
Admission: RE | Admit: 2017-01-30 | Discharge: 2017-01-30 | Disposition: A | Discharge status: Home / Self Care | Payer: Medicare Other | Source: Ambulatory Visit | Attending: Nephrology | Admitting: Nephrology

## 2017-01-30 DIAGNOSIS — D631 Anemia in chronic kidney disease: Secondary | ICD-10-CM | POA: Insufficient documentation

## 2017-01-30 DIAGNOSIS — N183 Chronic kidney disease, stage 3 (moderate): Secondary | ICD-10-CM | POA: Insufficient documentation

## 2017-01-30 HISTORY — DX: Anemia, unspecified: D64.9

## 2017-01-30 HISTORY — DX: Chronic kidney disease, unspecified: N18.9

## 2017-01-30 HISTORY — DX: Neuromuscular dysfunction of bladder, unspecified: N31.9

## 2017-01-30 HISTORY — DX: Cardiac arrhythmia, unspecified: I49.9

## 2017-01-30 HISTORY — DX: Gastro-esophageal reflux disease without esophagitis: K21.9

## 2017-01-30 LAB — POCT HEMOGLOBIN-HEMACUE: HEMOGLOBIN: 12.6 g/dL — AB (ref 13.0–17.0)

## 2017-01-30 LAB — FERRITIN: Ferritin: 19 ng/mL — ABNORMAL LOW (ref 24–336)

## 2017-01-30 LAB — IRON AND TIBC
Iron: 242 ug/dL — ABNORMAL HIGH (ref 45–182)
SATURATION RATIOS: 69 % — AB (ref 17.9–39.5)
TIBC: 350 ug/dL (ref 250–450)
UIBC: 108 ug/dL

## 2017-02-12 MED ORDER — EPOETIN ALFA 2000 UNIT/ML IJ SOLN: 2000.0000 [IU] | Freq: Once | INTRAMUSCULAR | Status: DC

## 2017-02-12 MED ORDER — EPOETIN ALFA 20000 UNIT/ML IJ SOLN: 3000.0000 [IU] | INTRAMUSCULAR | Status: DC

## 2017-02-13 ENCOUNTER — Encounter (HOSPITAL_COMMUNITY)
Admission: RE | Admit: 2017-02-13 | Discharge: 2017-02-13 | Disposition: A | Discharge status: Home / Self Care | Payer: Medicare Other | Source: Ambulatory Visit | Attending: Nephrology | Admitting: Nephrology

## 2017-02-13 DIAGNOSIS — D631 Anemia in chronic kidney disease: Secondary | ICD-10-CM | POA: Diagnosis not present

## 2017-02-13 DIAGNOSIS — N183 Chronic kidney disease, stage 3 (moderate): Secondary | ICD-10-CM | POA: Diagnosis not present

## 2017-02-13 LAB — POCT HEMOGLOBIN-HEMACUE: Hemoglobin: 12.8 g/dL — ABNORMAL LOW (ref 13.0–17.0)

## 2017-02-13 NOTE — Progress Notes (Signed)
Results for AASHIR, UMHOLTZ (MRN [id number]) as of 02/13/2017 13:16  Ref. Range 02/13/2017 13:03  Hemoglobin Latest Ref Range: 13.0 - 17.0 g/dL 12.8 (L)

## 2017-02-19 ENCOUNTER — Ambulatory Visit (INDEPENDENT_AMBULATORY_CARE_PROVIDER_SITE_OTHER): Payer: Medicare Other | Admitting: Urology

## 2017-02-19 DIAGNOSIS — N3020 Other chronic cystitis without hematuria: Secondary | ICD-10-CM | POA: Diagnosis not present

## 2017-02-21 DIAGNOSIS — Z08 Encounter for follow-up examination after completed treatment for malignant neoplasm: Secondary | ICD-10-CM | POA: Diagnosis not present

## 2017-02-21 DIAGNOSIS — L570 Actinic keratosis: Secondary | ICD-10-CM | POA: Diagnosis not present

## 2017-02-21 DIAGNOSIS — Z85828 Personal history of other malignant neoplasm of skin: Secondary | ICD-10-CM | POA: Diagnosis not present

## 2017-02-21 DIAGNOSIS — X32XXXD Exposure to sunlight, subsequent encounter: Secondary | ICD-10-CM | POA: Diagnosis not present

## 2017-02-21 DIAGNOSIS — D225 Melanocytic nevi of trunk: Secondary | ICD-10-CM | POA: Diagnosis not present

## 2017-02-21 DIAGNOSIS — Z1283 Encounter for screening for malignant neoplasm of skin: Secondary | ICD-10-CM | POA: Diagnosis not present

## 2017-02-27 ENCOUNTER — Encounter (HOSPITAL_COMMUNITY)
Admission: RE | Admit: 2017-02-27 | Discharge: 2017-02-27 | Disposition: A | Discharge status: Home / Self Care | Payer: Medicare Other | Source: Ambulatory Visit | Attending: Nephrology | Admitting: Nephrology

## 2017-02-27 DIAGNOSIS — D631 Anemia in chronic kidney disease: Secondary | ICD-10-CM | POA: Diagnosis not present

## 2017-02-27 DIAGNOSIS — N183 Chronic kidney disease, stage 3 (moderate): Secondary | ICD-10-CM | POA: Diagnosis not present

## 2017-02-27 NOTE — Progress Notes (Signed)
Arrived for hgb and procrit check. Hgb 12.0. No procrit required. Pt states that he does not need an appt. He is done with procrit. Stated he had called and left a message at Dr Beverly Gust office. I called [facility] and left a message with Marzetta Board to make sure that they are aware of this. Pt stated that he was going to [place] and that he would call and make an appt with Dr Justin Mend when he returns.

## 2017-02-28 LAB — POCT HEMOGLOBIN-HEMACUE: HEMOGLOBIN: 12 g/dL — AB (ref 13.0–17.0)

## 2017-03-13 DIAGNOSIS — D122 Benign neoplasm of ascending colon: Secondary | ICD-10-CM | POA: Diagnosis not present

## 2017-03-13 DIAGNOSIS — Z86010 Personal history of colonic polyps: Secondary | ICD-10-CM | POA: Diagnosis not present

## 2017-03-13 DIAGNOSIS — K5730 Diverticulosis of large intestine without perforation or abscess without bleeding: Secondary | ICD-10-CM | POA: Diagnosis not present

## 2017-03-13 DIAGNOSIS — K649 Unspecified hemorrhoids: Secondary | ICD-10-CM | POA: Diagnosis not present

## 2017-04-02 ENCOUNTER — Ambulatory Visit (INDEPENDENT_AMBULATORY_CARE_PROVIDER_SITE_OTHER): Payer: Medicare Other | Admitting: Urology

## 2017-04-02 DIAGNOSIS — N312 Flaccid neuropathic bladder, not elsewhere classified: Secondary | ICD-10-CM | POA: Diagnosis not present

## 2017-04-22 DIAGNOSIS — N183 Chronic kidney disease, stage 3 (moderate): Secondary | ICD-10-CM | POA: Diagnosis not present

## 2017-05-29 DIAGNOSIS — K219 Gastro-esophageal reflux disease without esophagitis: Secondary | ICD-10-CM | POA: Diagnosis not present

## 2017-05-29 DIAGNOSIS — R7301 Impaired fasting glucose: Secondary | ICD-10-CM | POA: Diagnosis not present

## 2017-05-29 DIAGNOSIS — I10 Essential (primary) hypertension: Secondary | ICD-10-CM | POA: Diagnosis not present

## 2017-05-29 DIAGNOSIS — N184 Chronic kidney disease, stage 4 (severe): Secondary | ICD-10-CM | POA: Diagnosis not present

## 2017-05-29 DIAGNOSIS — D631 Anemia in chronic kidney disease: Secondary | ICD-10-CM | POA: Diagnosis not present

## 2017-05-29 DIAGNOSIS — E782 Mixed hyperlipidemia: Secondary | ICD-10-CM | POA: Diagnosis not present

## 2017-05-31 DIAGNOSIS — N184 Chronic kidney disease, stage 4 (severe): Secondary | ICD-10-CM | POA: Diagnosis not present

## 2017-05-31 DIAGNOSIS — D649 Anemia, unspecified: Secondary | ICD-10-CM | POA: Diagnosis not present

## 2017-05-31 DIAGNOSIS — K219 Gastro-esophageal reflux disease without esophagitis: Secondary | ICD-10-CM | POA: Diagnosis not present

## 2017-05-31 DIAGNOSIS — E782 Mixed hyperlipidemia: Secondary | ICD-10-CM | POA: Diagnosis not present

## 2017-05-31 DIAGNOSIS — R7301 Impaired fasting glucose: Secondary | ICD-10-CM | POA: Diagnosis not present

## 2017-05-31 DIAGNOSIS — Z6826 Body mass index (BMI) 26.0-26.9, adult: Secondary | ICD-10-CM | POA: Diagnosis not present

## 2017-05-31 DIAGNOSIS — R339 Retention of urine, unspecified: Secondary | ICD-10-CM | POA: Diagnosis not present

## 2017-06-27 DIAGNOSIS — N2581 Secondary hyperparathyroidism of renal origin: Secondary | ICD-10-CM | POA: Diagnosis not present

## 2017-06-27 DIAGNOSIS — D631 Anemia in chronic kidney disease: Secondary | ICD-10-CM | POA: Diagnosis not present

## 2017-06-27 DIAGNOSIS — C189 Malignant neoplasm of colon, unspecified: Secondary | ICD-10-CM | POA: Diagnosis not present

## 2017-06-27 DIAGNOSIS — N183 Chronic kidney disease, stage 3 (moderate): Secondary | ICD-10-CM | POA: Diagnosis not present

## 2017-06-27 DIAGNOSIS — K219 Gastro-esophageal reflux disease without esophagitis: Secondary | ICD-10-CM | POA: Diagnosis not present

## 2017-06-27 DIAGNOSIS — N319 Neuromuscular dysfunction of bladder, unspecified: Secondary | ICD-10-CM | POA: Diagnosis not present

## 2017-06-28 ENCOUNTER — Ambulatory Visit (INDEPENDENT_AMBULATORY_CARE_PROVIDER_SITE_OTHER): Payer: Medicare Other | Admitting: Urology

## 2017-06-28 DIAGNOSIS — N3020 Other chronic cystitis without hematuria: Secondary | ICD-10-CM | POA: Diagnosis not present

## 2017-08-28 ENCOUNTER — Ambulatory Visit (INDEPENDENT_AMBULATORY_CARE_PROVIDER_SITE_OTHER): Payer: Medicare Other | Admitting: Urology

## 2017-08-28 DIAGNOSIS — N3020 Other chronic cystitis without hematuria: Secondary | ICD-10-CM | POA: Diagnosis not present

## 2017-09-27 DIAGNOSIS — C189 Malignant neoplasm of colon, unspecified: Secondary | ICD-10-CM | POA: Diagnosis not present

## 2017-09-27 DIAGNOSIS — N319 Neuromuscular dysfunction of bladder, unspecified: Secondary | ICD-10-CM | POA: Diagnosis not present

## 2017-09-27 DIAGNOSIS — D631 Anemia in chronic kidney disease: Secondary | ICD-10-CM | POA: Diagnosis not present

## 2017-09-27 DIAGNOSIS — N2581 Secondary hyperparathyroidism of renal origin: Secondary | ICD-10-CM | POA: Diagnosis not present

## 2017-09-27 DIAGNOSIS — N183 Chronic kidney disease, stage 3 (moderate): Secondary | ICD-10-CM | POA: Diagnosis not present

## 2017-09-27 DIAGNOSIS — K219 Gastro-esophageal reflux disease without esophagitis: Secondary | ICD-10-CM | POA: Diagnosis not present

## 2017-10-23 ENCOUNTER — Ambulatory Visit (INDEPENDENT_AMBULATORY_CARE_PROVIDER_SITE_OTHER): Payer: Medicare Other | Admitting: Urology

## 2017-10-23 DIAGNOSIS — N312 Flaccid neuropathic bladder, not elsewhere classified: Secondary | ICD-10-CM | POA: Diagnosis not present

## 2017-10-23 DIAGNOSIS — N3020 Other chronic cystitis without hematuria: Secondary | ICD-10-CM

## 2017-11-25 DIAGNOSIS — Z23 Encounter for immunization: Secondary | ICD-10-CM | POA: Diagnosis not present

## 2017-12-02 DIAGNOSIS — E782 Mixed hyperlipidemia: Secondary | ICD-10-CM | POA: Diagnosis not present

## 2017-12-02 DIAGNOSIS — D649 Anemia, unspecified: Secondary | ICD-10-CM | POA: Diagnosis not present

## 2017-12-02 DIAGNOSIS — K219 Gastro-esophageal reflux disease without esophagitis: Secondary | ICD-10-CM | POA: Diagnosis not present

## 2017-12-02 DIAGNOSIS — D631 Anemia in chronic kidney disease: Secondary | ICD-10-CM | POA: Diagnosis not present

## 2017-12-02 DIAGNOSIS — N184 Chronic kidney disease, stage 4 (severe): Secondary | ICD-10-CM | POA: Diagnosis not present

## 2017-12-02 DIAGNOSIS — R339 Retention of urine, unspecified: Secondary | ICD-10-CM | POA: Diagnosis not present

## 2017-12-02 DIAGNOSIS — Z6826 Body mass index (BMI) 26.0-26.9, adult: Secondary | ICD-10-CM | POA: Diagnosis not present

## 2017-12-02 DIAGNOSIS — R7301 Impaired fasting glucose: Secondary | ICD-10-CM | POA: Diagnosis not present

## 2017-12-05 DIAGNOSIS — R339 Retention of urine, unspecified: Secondary | ICD-10-CM | POA: Diagnosis not present

## 2017-12-05 DIAGNOSIS — Z0000 Encounter for general adult medical examination without abnormal findings: Secondary | ICD-10-CM | POA: Diagnosis not present

## 2017-12-05 DIAGNOSIS — D631 Anemia in chronic kidney disease: Secondary | ICD-10-CM | POA: Diagnosis not present

## 2017-12-05 DIAGNOSIS — N184 Chronic kidney disease, stage 4 (severe): Secondary | ICD-10-CM | POA: Diagnosis not present

## 2017-12-05 DIAGNOSIS — R7301 Impaired fasting glucose: Secondary | ICD-10-CM | POA: Diagnosis not present

## 2017-12-05 DIAGNOSIS — K219 Gastro-esophageal reflux disease without esophagitis: Secondary | ICD-10-CM | POA: Diagnosis not present

## 2017-12-05 DIAGNOSIS — E782 Mixed hyperlipidemia: Secondary | ICD-10-CM | POA: Diagnosis not present

## 2017-12-13 ENCOUNTER — Other Ambulatory Visit: Payer: Self-pay

## 2017-12-25 ENCOUNTER — Ambulatory Visit (INDEPENDENT_AMBULATORY_CARE_PROVIDER_SITE_OTHER): Payer: Medicare Other | Admitting: Urology

## 2017-12-25 DIAGNOSIS — N312 Flaccid neuropathic bladder, not elsewhere classified: Secondary | ICD-10-CM

## 2017-12-25 DIAGNOSIS — N3020 Other chronic cystitis without hematuria: Secondary | ICD-10-CM | POA: Diagnosis not present

## 2018-02-24 DIAGNOSIS — X32XXXD Exposure to sunlight, subsequent encounter: Secondary | ICD-10-CM | POA: Diagnosis not present

## 2018-02-24 DIAGNOSIS — D044 Carcinoma in situ of skin of scalp and neck: Secondary | ICD-10-CM | POA: Diagnosis not present

## 2018-02-24 DIAGNOSIS — Z1283 Encounter for screening for malignant neoplasm of skin: Secondary | ICD-10-CM | POA: Diagnosis not present

## 2018-02-24 DIAGNOSIS — L570 Actinic keratosis: Secondary | ICD-10-CM | POA: Diagnosis not present

## 2018-02-24 DIAGNOSIS — D225 Melanocytic nevi of trunk: Secondary | ICD-10-CM | POA: Diagnosis not present

## 2018-02-26 DIAGNOSIS — N2581 Secondary hyperparathyroidism of renal origin: Secondary | ICD-10-CM | POA: Diagnosis not present

## 2018-02-26 DIAGNOSIS — D631 Anemia in chronic kidney disease: Secondary | ICD-10-CM | POA: Diagnosis not present

## 2018-02-26 DIAGNOSIS — C189 Malignant neoplasm of colon, unspecified: Secondary | ICD-10-CM | POA: Diagnosis not present

## 2018-02-26 DIAGNOSIS — N184 Chronic kidney disease, stage 4 (severe): Secondary | ICD-10-CM | POA: Diagnosis not present

## 2018-02-26 DIAGNOSIS — K219 Gastro-esophageal reflux disease without esophagitis: Secondary | ICD-10-CM | POA: Diagnosis not present

## 2018-02-26 DIAGNOSIS — N319 Neuromuscular dysfunction of bladder, unspecified: Secondary | ICD-10-CM | POA: Diagnosis not present

## 2018-02-28 ENCOUNTER — Ambulatory Visit (INDEPENDENT_AMBULATORY_CARE_PROVIDER_SITE_OTHER): Payer: Medicare Other | Admitting: Urology

## 2018-02-28 DIAGNOSIS — N312 Flaccid neuropathic bladder, not elsewhere classified: Secondary | ICD-10-CM | POA: Diagnosis not present

## 2018-05-27 ENCOUNTER — Ambulatory Visit (INDEPENDENT_AMBULATORY_CARE_PROVIDER_SITE_OTHER): Payer: Medicare Other | Admitting: Urology

## 2018-05-27 DIAGNOSIS — N312 Flaccid neuropathic bladder, not elsewhere classified: Secondary | ICD-10-CM | POA: Diagnosis not present

## 2018-06-13 DIAGNOSIS — R7301 Impaired fasting glucose: Secondary | ICD-10-CM | POA: Diagnosis not present

## 2018-06-13 DIAGNOSIS — D631 Anemia in chronic kidney disease: Secondary | ICD-10-CM | POA: Diagnosis not present

## 2018-06-13 DIAGNOSIS — D649 Anemia, unspecified: Secondary | ICD-10-CM | POA: Diagnosis not present

## 2018-06-13 DIAGNOSIS — N184 Chronic kidney disease, stage 4 (severe): Secondary | ICD-10-CM | POA: Diagnosis not present

## 2018-06-13 DIAGNOSIS — E782 Mixed hyperlipidemia: Secondary | ICD-10-CM | POA: Diagnosis not present

## 2018-06-19 DIAGNOSIS — R7301 Impaired fasting glucose: Secondary | ICD-10-CM | POA: Diagnosis not present

## 2018-06-19 DIAGNOSIS — N184 Chronic kidney disease, stage 4 (severe): Secondary | ICD-10-CM | POA: Diagnosis not present

## 2018-06-19 DIAGNOSIS — E782 Mixed hyperlipidemia: Secondary | ICD-10-CM | POA: Diagnosis not present

## 2018-06-19 DIAGNOSIS — K219 Gastro-esophageal reflux disease without esophagitis: Secondary | ICD-10-CM | POA: Diagnosis not present

## 2018-06-19 DIAGNOSIS — N2581 Secondary hyperparathyroidism of renal origin: Secondary | ICD-10-CM | POA: Diagnosis not present

## 2018-06-19 DIAGNOSIS — D631 Anemia in chronic kidney disease: Secondary | ICD-10-CM | POA: Diagnosis not present

## 2018-06-19 DIAGNOSIS — N319 Neuromuscular dysfunction of bladder, unspecified: Secondary | ICD-10-CM | POA: Diagnosis not present

## 2018-07-09 DIAGNOSIS — C189 Malignant neoplasm of colon, unspecified: Secondary | ICD-10-CM | POA: Diagnosis not present

## 2018-07-09 DIAGNOSIS — N184 Chronic kidney disease, stage 4 (severe): Secondary | ICD-10-CM | POA: Diagnosis not present

## 2018-07-09 DIAGNOSIS — D631 Anemia in chronic kidney disease: Secondary | ICD-10-CM | POA: Diagnosis not present

## 2018-07-09 DIAGNOSIS — K219 Gastro-esophageal reflux disease without esophagitis: Secondary | ICD-10-CM | POA: Diagnosis not present

## 2018-07-09 DIAGNOSIS — N2581 Secondary hyperparathyroidism of renal origin: Secondary | ICD-10-CM | POA: Diagnosis not present

## 2018-07-09 DIAGNOSIS — N319 Neuromuscular dysfunction of bladder, unspecified: Secondary | ICD-10-CM | POA: Diagnosis not present

## 2018-07-25 ENCOUNTER — Ambulatory Visit: Payer: Medicare Other | Admitting: Urology

## 2018-07-29 ENCOUNTER — Ambulatory Visit (INDEPENDENT_AMBULATORY_CARE_PROVIDER_SITE_OTHER): Payer: Medicare Other | Admitting: Urology

## 2018-07-29 DIAGNOSIS — N312 Flaccid neuropathic bladder, not elsewhere classified: Secondary | ICD-10-CM

## 2018-10-03 ENCOUNTER — Ambulatory Visit: Payer: Medicare Other | Admitting: Urology

## 2018-10-03 ENCOUNTER — Ambulatory Visit (INDEPENDENT_AMBULATORY_CARE_PROVIDER_SITE_OTHER): Payer: Medicare Other | Admitting: Urology

## 2018-10-03 DIAGNOSIS — N312 Flaccid neuropathic bladder, not elsewhere classified: Secondary | ICD-10-CM | POA: Diagnosis not present

## 2018-11-28 ENCOUNTER — Ambulatory Visit (INDEPENDENT_AMBULATORY_CARE_PROVIDER_SITE_OTHER): Payer: Medicare Other | Admitting: Urology

## 2018-11-28 ENCOUNTER — Other Ambulatory Visit: Payer: Self-pay

## 2018-11-28 DIAGNOSIS — N312 Flaccid neuropathic bladder, not elsewhere classified: Secondary | ICD-10-CM

## 2018-12-19 DIAGNOSIS — E782 Mixed hyperlipidemia: Secondary | ICD-10-CM | POA: Diagnosis not present

## 2018-12-19 DIAGNOSIS — D649 Anemia, unspecified: Secondary | ICD-10-CM | POA: Diagnosis not present

## 2018-12-19 DIAGNOSIS — D631 Anemia in chronic kidney disease: Secondary | ICD-10-CM | POA: Diagnosis not present

## 2018-12-19 DIAGNOSIS — R7301 Impaired fasting glucose: Secondary | ICD-10-CM | POA: Diagnosis not present

## 2018-12-19 DIAGNOSIS — N184 Chronic kidney disease, stage 4 (severe): Secondary | ICD-10-CM | POA: Diagnosis not present

## 2018-12-24 DIAGNOSIS — N184 Chronic kidney disease, stage 4 (severe): Secondary | ICD-10-CM | POA: Diagnosis not present

## 2018-12-24 DIAGNOSIS — Z0001 Encounter for general adult medical examination with abnormal findings: Secondary | ICD-10-CM | POA: Diagnosis not present

## 2018-12-24 DIAGNOSIS — N319 Neuromuscular dysfunction of bladder, unspecified: Secondary | ICD-10-CM | POA: Diagnosis not present

## 2018-12-24 DIAGNOSIS — R7301 Impaired fasting glucose: Secondary | ICD-10-CM | POA: Diagnosis not present

## 2018-12-24 DIAGNOSIS — D631 Anemia in chronic kidney disease: Secondary | ICD-10-CM | POA: Diagnosis not present

## 2018-12-24 DIAGNOSIS — E782 Mixed hyperlipidemia: Secondary | ICD-10-CM | POA: Diagnosis not present

## 2018-12-24 DIAGNOSIS — N2581 Secondary hyperparathyroidism of renal origin: Secondary | ICD-10-CM | POA: Diagnosis not present

## 2018-12-24 DIAGNOSIS — Z23 Encounter for immunization: Secondary | ICD-10-CM | POA: Diagnosis not present

## 2018-12-24 DIAGNOSIS — K219 Gastro-esophageal reflux disease without esophagitis: Secondary | ICD-10-CM | POA: Diagnosis not present

## 2019-01-20 ENCOUNTER — Ambulatory Visit: Payer: Medicare Other

## 2019-01-20 ENCOUNTER — Ambulatory Visit (INDEPENDENT_AMBULATORY_CARE_PROVIDER_SITE_OTHER): Payer: Medicare Other

## 2019-01-20 DIAGNOSIS — N312 Flaccid neuropathic bladder, not elsewhere classified: Secondary | ICD-10-CM | POA: Diagnosis not present

## 2019-01-20 NOTE — Progress Notes (Signed)
Suprapubic Cath Change  Patient is present today for a suprapubic catheter change due to urinary retention.  30ml of water was drained from the balloon, a 22FR foley cath was removed from the tract with out difficulty.  Site was cleaned and prepped in a sterile fashion with betadine.  A 22FR foley cath was replaced into the tract no complications were noted. Urine return was noted, 10 ml of sterile water was inflated into the balloon.  Patient tolerated well. A night bag was given to patient and proper instruction was given on how to switch bags.    Preformed by: Adleigh Mcmasters LPN  Follow up: 8 wk NV cath change.

## 2019-02-10 DIAGNOSIS — Z23 Encounter for immunization: Secondary | ICD-10-CM | POA: Diagnosis not present

## 2019-03-11 DIAGNOSIS — D631 Anemia in chronic kidney disease: Secondary | ICD-10-CM | POA: Diagnosis not present

## 2019-03-11 DIAGNOSIS — I120 Hypertensive chronic kidney disease with stage 5 chronic kidney disease or end stage renal disease: Secondary | ICD-10-CM | POA: Diagnosis not present

## 2019-03-11 DIAGNOSIS — N185 Chronic kidney disease, stage 5: Secondary | ICD-10-CM | POA: Diagnosis not present

## 2019-03-17 ENCOUNTER — Other Ambulatory Visit: Payer: Self-pay

## 2019-03-17 ENCOUNTER — Ambulatory Visit (INDEPENDENT_AMBULATORY_CARE_PROVIDER_SITE_OTHER): Payer: Medicare Other

## 2019-03-17 DIAGNOSIS — N312 Flaccid neuropathic bladder, not elsewhere classified: Secondary | ICD-10-CM

## 2019-03-17 NOTE — Progress Notes (Signed)
Suprapubic Cath Change  Patient is present today for a suprapubic catheter change due to urinary retention.  10 ml of water was drained from the balloon, a 22 FR foley cath was removed from the tract with out difficulty.  Site was cleaned and prepped in a sterile fashion with betadine.  A 22 FR foley cath was replaced into the tract no complications were noted. Urine return was noted, 10 ml of sterile water was inflated into the balloon and a plug attached.  Patient tolerated well.  Preformed by: H. Hanalei Glace LPN   Follow up: 8 wks NV

## 2019-03-23 DIAGNOSIS — Z23 Encounter for immunization: Secondary | ICD-10-CM | POA: Diagnosis not present

## 2019-05-26 ENCOUNTER — Ambulatory Visit (INDEPENDENT_AMBULATORY_CARE_PROVIDER_SITE_OTHER): Payer: Medicare Other

## 2019-05-26 ENCOUNTER — Other Ambulatory Visit: Payer: Self-pay

## 2019-05-26 DIAGNOSIS — N312 Flaccid neuropathic bladder, not elsewhere classified: Secondary | ICD-10-CM

## 2019-05-26 NOTE — Progress Notes (Signed)
Suprapubic Cath Change  Patient is present today for a suprapubic catheter change due to urinary retention.  43ml of water was drained from the balloon, a 22FR foley cath was removed from the tract with out difficulty.  Site was cleaned and prepped in a sterile fashion with betadine.  A 22FR foley cath was replaced into the tract no complications were noted. Urine return was noted, 10 ml of sterile water was inflated into the balloon and a cap was attached. Patient tolerated well. A night bag was given to patient and proper instruction was given on how to switch bags.    Preformed by: Kemari Narez LPN  Follow up: 8 weeks NV sp cath change

## 2019-06-05 DIAGNOSIS — N2581 Secondary hyperparathyroidism of renal origin: Secondary | ICD-10-CM | POA: Diagnosis not present

## 2019-06-05 DIAGNOSIS — N185 Chronic kidney disease, stage 5: Secondary | ICD-10-CM | POA: Diagnosis not present

## 2019-06-05 DIAGNOSIS — I120 Hypertensive chronic kidney disease with stage 5 chronic kidney disease or end stage renal disease: Secondary | ICD-10-CM | POA: Diagnosis not present

## 2019-06-05 DIAGNOSIS — D631 Anemia in chronic kidney disease: Secondary | ICD-10-CM | POA: Diagnosis not present

## 2019-06-05 DIAGNOSIS — N189 Chronic kidney disease, unspecified: Secondary | ICD-10-CM | POA: Diagnosis not present

## 2019-06-05 DIAGNOSIS — N319 Neuromuscular dysfunction of bladder, unspecified: Secondary | ICD-10-CM | POA: Diagnosis not present

## 2019-07-03 DIAGNOSIS — Z0001 Encounter for general adult medical examination with abnormal findings: Secondary | ICD-10-CM | POA: Diagnosis not present

## 2019-07-03 DIAGNOSIS — Z6826 Body mass index (BMI) 26.0-26.9, adult: Secondary | ICD-10-CM | POA: Diagnosis not present

## 2019-07-03 DIAGNOSIS — R339 Retention of urine, unspecified: Secondary | ICD-10-CM | POA: Diagnosis not present

## 2019-07-03 DIAGNOSIS — E782 Mixed hyperlipidemia: Secondary | ICD-10-CM | POA: Diagnosis not present

## 2019-07-03 DIAGNOSIS — N2581 Secondary hyperparathyroidism of renal origin: Secondary | ICD-10-CM | POA: Diagnosis not present

## 2019-07-03 DIAGNOSIS — D631 Anemia in chronic kidney disease: Secondary | ICD-10-CM | POA: Diagnosis not present

## 2019-07-03 DIAGNOSIS — R7301 Impaired fasting glucose: Secondary | ICD-10-CM | POA: Diagnosis not present

## 2019-07-03 DIAGNOSIS — N184 Chronic kidney disease, stage 4 (severe): Secondary | ICD-10-CM | POA: Diagnosis not present

## 2019-07-03 DIAGNOSIS — Z0000 Encounter for general adult medical examination without abnormal findings: Secondary | ICD-10-CM | POA: Diagnosis not present

## 2019-07-03 DIAGNOSIS — K219 Gastro-esophageal reflux disease without esophagitis: Secondary | ICD-10-CM | POA: Diagnosis not present

## 2019-07-03 DIAGNOSIS — N319 Neuromuscular dysfunction of bladder, unspecified: Secondary | ICD-10-CM | POA: Diagnosis not present

## 2019-07-03 DIAGNOSIS — D649 Anemia, unspecified: Secondary | ICD-10-CM | POA: Diagnosis not present

## 2019-07-09 DIAGNOSIS — N319 Neuromuscular dysfunction of bladder, unspecified: Secondary | ICD-10-CM | POA: Diagnosis not present

## 2019-07-09 DIAGNOSIS — Z23 Encounter for immunization: Secondary | ICD-10-CM | POA: Diagnosis not present

## 2019-07-09 DIAGNOSIS — D631 Anemia in chronic kidney disease: Secondary | ICD-10-CM | POA: Diagnosis not present

## 2019-07-09 DIAGNOSIS — R7301 Impaired fasting glucose: Secondary | ICD-10-CM | POA: Diagnosis not present

## 2019-07-09 DIAGNOSIS — E782 Mixed hyperlipidemia: Secondary | ICD-10-CM | POA: Diagnosis not present

## 2019-07-09 DIAGNOSIS — Z936 Other artificial openings of urinary tract status: Secondary | ICD-10-CM | POA: Diagnosis not present

## 2019-07-09 DIAGNOSIS — N184 Chronic kidney disease, stage 4 (severe): Secondary | ICD-10-CM | POA: Diagnosis not present

## 2019-07-09 DIAGNOSIS — K219 Gastro-esophageal reflux disease without esophagitis: Secondary | ICD-10-CM | POA: Diagnosis not present

## 2019-07-09 DIAGNOSIS — N2581 Secondary hyperparathyroidism of renal origin: Secondary | ICD-10-CM | POA: Diagnosis not present

## 2019-07-09 DIAGNOSIS — E876 Hypokalemia: Secondary | ICD-10-CM | POA: Diagnosis not present

## 2019-08-04 ENCOUNTER — Ambulatory Visit (INDEPENDENT_AMBULATORY_CARE_PROVIDER_SITE_OTHER): Payer: Medicare Other

## 2019-08-04 ENCOUNTER — Other Ambulatory Visit: Payer: Self-pay

## 2019-08-04 DIAGNOSIS — N312 Flaccid neuropathic bladder, not elsewhere classified: Secondary | ICD-10-CM

## 2019-08-04 NOTE — Progress Notes (Signed)
Cath Change/ Replacement  Patient is present today for a catheter change due to urinary retention.  85ml of water was removed from the balloon, a 22FR foley cath was removed with out difficulty.  Patient was cleaned and prepped in a sterile fashion with betadine. A 22 FR foley cath was replaced into the bladder no complications were noted Urine return was noted 76ml and urine was yellow in color. The balloon was filled with 23ml of sterile water.  Patient was given proper instruction on catheter care.    Performed by: Valentina Lucks, LPN  Follow up: 8 wk ov/cath change

## 2019-09-29 ENCOUNTER — Other Ambulatory Visit: Payer: Medicare Other | Admitting: Urology

## 2019-09-29 ENCOUNTER — Ambulatory Visit (INDEPENDENT_AMBULATORY_CARE_PROVIDER_SITE_OTHER): Payer: Medicare Other | Admitting: Urology

## 2019-09-29 ENCOUNTER — Other Ambulatory Visit: Payer: Self-pay

## 2019-09-29 VITALS — BP 160/89 | HR 89 | Temp 98.0°F | Ht 72.0 in | Wt 182.0 lb

## 2019-09-29 DIAGNOSIS — N312 Flaccid neuropathic bladder, not elsewhere classified: Secondary | ICD-10-CM

## 2019-09-29 MED ORDER — CIPROFLOXACIN HCL 500 MG PO TABS
500.0000 mg | ORAL_TABLET | Freq: Once | ORAL | Status: AC
  Administered 2019-09-29: 13:00:00 500 mg via ORAL

## 2019-09-29 NOTE — Progress Notes (Signed)

## 2019-09-29 NOTE — Progress Notes (Signed)
H&P  Chief Complaint: Flaccid Neuropathic Bladder, not elsewhere classified.  History of Present Illness:  8.31.2021: He gets his catheter changed every other month.  He has had no issues with infections with his catheter in place and denies any gross hematuria.  (below copied from [facility] records):  He had previously seen Dr. Nelma Rothman, as well as Alvin Critchley and Jimmey Ralph . He has a suprapubic tube which was placed for a neurogenic bladder in July of 2010. Prior to presentation in 2010, he was seen by urologist in [place]. When he presented here, he did have hydronephrosis of the right kidney, as well as chronic UTIs and bladder diverticulae. His hydronephrosis resolved with adequate bladder drainage.      Past Medical History:  Diagnosis Date  . Anemia   . Chronic kidney disease   . Dysrhythmia   . GERD (gastroesophageal reflux disease)   . Neurogenic bladder     Past Surgical History:  Procedure Laterality Date  . CYSTOURETHROSCOPY    . SUPRAPUBIC CATHETER PLACEMENT      Home Medications:  Allergies as of 09/29/2019   No Known Allergies     Medication List       Accurate as of September 29, 2019 10:44 AM. If you have any questions, ask your nurse or doctor.        alfuzosin 10 MG 24 hr tablet Commonly known as: UROXATRAL Take 10 mg by mouth once.   aspirin EC 81 MG tablet Take 81 mg by mouth daily.   colchicine 0.6 MG tablet Take 0.6 mg by mouth daily.   NIFEdipine 60 MG 24 hr tablet Commonly known as: ADALAT CC Take 60 mg by mouth daily.   omeprazole 20 MG capsule Commonly known as: PRILOSEC Take 20 mg by mouth daily.   peg 3350 powder 100 g Solr Commonly known as: MoviPrep Take 1 kit (100 g total) by mouth once.   simvastatin 40 MG tablet Commonly known as: ZOCOR Take 40 mg by mouth daily.       Allergies: No Known Allergies  No family history on file.  Social History:  reports that he has never smoked. He has never used  smokeless tobacco. He reports that he does not drink alcohol and does not use drugs.  Urological Symptom Review  Patient is experiencing the following symptoms: none   Review of Systems  Gastrointestinal (upper)  : Negative for upper GI symptoms  Gastrointestinal (lower) : Negative for lower GI symptoms  Constitutional : Negative for symptoms  Skin: Negative for skin symptoms  Eyes: Negative for eye symptoms  Ear/Nose/Throat : Negative for Ear/Nose/Throat symptoms  Hematologic/Lymphatic: Negative for Hematologic/Lymphatic symptoms  Cardiovascular : Negative for cardiovascular symptoms  Respiratory : Negative for respiratory symptoms  Endocrine: Negative for endocrine symptoms  Musculoskeletal: Negative for musculoskeletal symptoms  Neurological: Negative for neurological symptoms  Psychologic: Negative for psychiatric symptoms   Physical Exam:  Vital signs in last 24 hours: There were no vitals taken for this visit. Constitutional:  Alert and oriented, No acute distress Cardiovascular: Regular rate  Respiratory: Normal respiratory effort GI: Abdomen is soft, nontender, nondistended, no abdominal masses. No CVAT.  Genitourinary: Normal male phallus, testes are descended bilaterally and non-tender and without masses, scrotum is normal in appearance without lesions or masses, perineum is normal on inspection. Lymphatic: No lymphadenopathy Neurologic: Grossly intact, no focal deficits Psychiatric: Normal mood and affect  Cystoscopy Procedure Note:  Indication: Follow-up of chronic suprapubic tube  After informed consent  and discussion of the procedure and its risks, Glenn Olson was positioned and prepped in the standard fashion.  Cystoscopy was performed with a flexible cystoscope.    Bladder neck: Normal Ureteral orifices: Somewhat difficult to see but normally placed and of normal configuration Bladder: Multiple trabeculations and cellules.  No  urothelial abnormalities  The patient tolerated the procedure well.     Impression/Assessment:  Chronic suprapubic tube, normal cystoscopy today  Plan:  I will see back in 1 year for cystoscopy

## 2019-11-11 DIAGNOSIS — H6123 Impacted cerumen, bilateral: Secondary | ICD-10-CM | POA: Diagnosis not present

## 2019-11-11 DIAGNOSIS — H9193 Unspecified hearing loss, bilateral: Secondary | ICD-10-CM | POA: Diagnosis not present

## 2019-11-16 DIAGNOSIS — L281 Prurigo nodularis: Secondary | ICD-10-CM | POA: Diagnosis not present

## 2019-11-16 DIAGNOSIS — D485 Neoplasm of uncertain behavior of skin: Secondary | ICD-10-CM | POA: Diagnosis not present

## 2019-11-16 DIAGNOSIS — C44629 Squamous cell carcinoma of skin of left upper limb, including shoulder: Secondary | ICD-10-CM | POA: Diagnosis not present

## 2019-11-24 ENCOUNTER — Other Ambulatory Visit: Payer: Self-pay

## 2019-11-24 ENCOUNTER — Ambulatory Visit (INDEPENDENT_AMBULATORY_CARE_PROVIDER_SITE_OTHER): Payer: Medicare Other

## 2019-11-24 DIAGNOSIS — N312 Flaccid neuropathic bladder, not elsewhere classified: Secondary | ICD-10-CM

## 2019-11-24 NOTE — Addendum Note (Signed)
Addended by: Iris Pert on: 11/24/2019 11:01 AM   Modules accepted: Level of Service

## 2019-11-24 NOTE — Progress Notes (Signed)
Suprapubic Cath Change  Patient is present today for a suprapubic catheter change due to urinary retention.  78ml of water was drained from the balloon, a 22 FR foley cath was removed from the tract with out difficulty.  Site was cleaned and prepped in a sterile fashion with betadine.  A 22 FR foley cath was replaced into the tract no complications were noted. Urine return was noted, 10 ml of sterile water was inflated into the balloon and a cath plug was attached.  Patient tolerated well.  Performed by: Kavita Bartl, LPN  Follow up: Keep next scheduled OV

## 2020-01-07 DIAGNOSIS — Z6826 Body mass index (BMI) 26.0-26.9, adult: Secondary | ICD-10-CM | POA: Diagnosis not present

## 2020-01-07 DIAGNOSIS — R339 Retention of urine, unspecified: Secondary | ICD-10-CM | POA: Diagnosis not present

## 2020-01-07 DIAGNOSIS — N319 Neuromuscular dysfunction of bladder, unspecified: Secondary | ICD-10-CM | POA: Diagnosis not present

## 2020-01-07 DIAGNOSIS — D649 Anemia, unspecified: Secondary | ICD-10-CM | POA: Diagnosis not present

## 2020-01-07 DIAGNOSIS — N2581 Secondary hyperparathyroidism of renal origin: Secondary | ICD-10-CM | POA: Diagnosis not present

## 2020-01-07 DIAGNOSIS — N184 Chronic kidney disease, stage 4 (severe): Secondary | ICD-10-CM | POA: Diagnosis not present

## 2020-01-07 DIAGNOSIS — K219 Gastro-esophageal reflux disease without esophagitis: Secondary | ICD-10-CM | POA: Diagnosis not present

## 2020-01-07 DIAGNOSIS — D631 Anemia in chronic kidney disease: Secondary | ICD-10-CM | POA: Diagnosis not present

## 2020-01-07 DIAGNOSIS — E876 Hypokalemia: Secondary | ICD-10-CM | POA: Diagnosis not present

## 2020-01-07 DIAGNOSIS — H6123 Impacted cerumen, bilateral: Secondary | ICD-10-CM | POA: Diagnosis not present

## 2020-01-07 DIAGNOSIS — R7301 Impaired fasting glucose: Secondary | ICD-10-CM | POA: Diagnosis not present

## 2020-01-07 DIAGNOSIS — E782 Mixed hyperlipidemia: Secondary | ICD-10-CM | POA: Diagnosis not present

## 2020-01-14 DIAGNOSIS — E782 Mixed hyperlipidemia: Secondary | ICD-10-CM | POA: Diagnosis not present

## 2020-01-14 DIAGNOSIS — N184 Chronic kidney disease, stage 4 (severe): Secondary | ICD-10-CM | POA: Diagnosis not present

## 2020-01-14 DIAGNOSIS — Z23 Encounter for immunization: Secondary | ICD-10-CM | POA: Diagnosis not present

## 2020-01-14 DIAGNOSIS — N2581 Secondary hyperparathyroidism of renal origin: Secondary | ICD-10-CM | POA: Diagnosis not present

## 2020-01-14 DIAGNOSIS — E876 Hypokalemia: Secondary | ICD-10-CM | POA: Diagnosis not present

## 2020-01-14 DIAGNOSIS — K219 Gastro-esophageal reflux disease without esophagitis: Secondary | ICD-10-CM | POA: Diagnosis not present

## 2020-01-14 DIAGNOSIS — N319 Neuromuscular dysfunction of bladder, unspecified: Secondary | ICD-10-CM | POA: Diagnosis not present

## 2020-01-14 DIAGNOSIS — D631 Anemia in chronic kidney disease: Secondary | ICD-10-CM | POA: Diagnosis not present

## 2020-01-14 DIAGNOSIS — R7301 Impaired fasting glucose: Secondary | ICD-10-CM | POA: Diagnosis not present

## 2020-01-14 DIAGNOSIS — Z936 Other artificial openings of urinary tract status: Secondary | ICD-10-CM | POA: Diagnosis not present

## 2020-01-19 ENCOUNTER — Ambulatory Visit: Payer: Medicare Other

## 2020-01-20 ENCOUNTER — Ambulatory Visit: Payer: Medicare Other

## 2020-01-20 ENCOUNTER — Other Ambulatory Visit: Payer: Self-pay

## 2020-01-20 DIAGNOSIS — N312 Flaccid neuropathic bladder, not elsewhere classified: Secondary | ICD-10-CM

## 2020-02-03 DIAGNOSIS — Z23 Encounter for immunization: Secondary | ICD-10-CM | POA: Diagnosis not present

## 2020-02-25 ENCOUNTER — Ambulatory Visit: Payer: Medicare Other

## 2020-03-24 ENCOUNTER — Ambulatory Visit (INDEPENDENT_AMBULATORY_CARE_PROVIDER_SITE_OTHER): Payer: Medicare Other

## 2020-03-24 ENCOUNTER — Other Ambulatory Visit: Payer: Self-pay

## 2020-03-24 DIAGNOSIS — N312 Flaccid neuropathic bladder, not elsewhere classified: Secondary | ICD-10-CM

## 2020-03-24 NOTE — Progress Notes (Signed)
Suprapubic Cath Change  Patient is present today for a suprapubic catheter change due to urinary retention.  61ml of water was drained from the balloon, a 22FR foley cath was removed from the tract with out difficulty.  Site was cleaned and prepped in a sterile fashion with betadine.  A 22FR foley cath was replaced into the tract no complications were noted. Urine return was noted, 10 ml of sterile water was inflated into the balloon and a cath plug applied.     Performed by: Koty Anctil, lpn  Follow up: Keep next scheduled NV

## 2020-03-24 NOTE — Patient Instructions (Signed)
Suprapubic Catheter Replacement  Suprapubic catheter replacement is a procedure to remove an old catheter and insert a new, clean catheter. A suprapubic catheter is a rubber tube that drains urine from the bladder into a collection bag outside the body. The catheter is inserted into the bladder through a small opening in the lower abdomen, near the center of the body, above the pubic bone (suprapubicarea). There is a tiny balloon filled with germ-free (sterile) water on the end of the catheter that is in the bladder. The balloon helps to keep the catheter in place. If you need to wear a catheter for a long period of time, you may be instructed to replace the catheter yourself. Usually, suprapubic catheters need to be replaced every 4-6 weeks, or as often as told by your health care provider. What are the risks? Generally, this is a safe procedure. However, problems may occur, including failure to get the catheter into the bladder. What happens before the procedure?  You may have an exam or testing, including a blood or urine sample.  Ask your health care provider what steps will be taken to help prevent infection. What happens during the procedure?  You will lie on your back.  The water from the balloon will be removed using a syringe.  The catheter will be slowly removed.  Lubricant will be applied to the end of the new catheter that will go into your bladder.  The new catheter will be inserted through the opening in your abdomen. Your health care provider will slide the catheter into your bladder.  Your health care provider will wait for some urine to start flowing through the catheter. When this happens, a syringe will be used to fill the balloon with sterile water.  A collection bag will be attached to the end of the catheter. The procedure may vary among health care providers and hospitals. What can I expect after procedure? After the procedure, it is common to have:  Some  discomfort around the opening in your abdomen. Follow these instructions at home: Caring for the skin around the catheter Use a clean washcloth and soapy water to clean the skin around your catheter every day. Pat the area dry with a clean towel.  Do not pull on the catheter.  Do not use ointment or lotion on this area unless told by your health care provider.  Check the skin around the catheter every day for signs of infection. Check for: ? Redness, swelling, or pain. ? Fluid or blood. ? Warmth. ? Pus or a bad smell. Caring for the catheter  Clean the catheter with soap and water as often as told by your health care provider.  Always make sure there are no twists or curls (kinks) in the catheter.  As soon as you are able to move, you may use a leg bag to collect the urine. ? Make sure that the tubing is straight and without kinks. ? Wrap an ace bandage gently over the tubing and around your leg to minimize the risk of the bag getting pulled out. Emptying the collection bag Empty the large collection bag every 8 hours. Empty the small collection bag when it is about ? full. To empty your large or small collection bag, take the following steps:  Always keep the bag below the level of the catheter. This keeps urine from flowing backward into the catheter.  Hold the bag over the toilet or another container. Turn the valve (spigot) at the bottom of   the bag to empty the urine. ? Do not touch the opening of the spigot. ? Do not let the opening touch the toilet or container.  Close the spigot tightly when the bag is empty. Cleaning the collection bag  Wash your hands with soap and water. If soap and water are not available, use hand sanitizer.  Disconnect the bag from the catheter and immediately attach a new bag to the catheter.  Empty the used bag completely.  Clean the used bag according to the manufacturer's instructions, or as told by your health care provider.  Let the bag  dry completely, and put it in a clean plastic bag before storing it.   General instructions  Always wash your hands before and after caring for your catheter and collection bag. Use soap and water. If soap and water are not available, use hand sanitizer.  Always make sure there are no leaks in the catheter or collection bag.  Drink enough fluid to keep your urine pale yellow.  If you were prescribed an antibiotic medicine, take it as told by your health care provider. Do not stop taking the antibiotic even if you start to feel better.  Do not take baths, swim, or use a hot tub.  Keep all follow-up visits as told by your health care provider. This is important.   Contact a health care provider if:  You leak urine.  You have redness, swelling, or pain around your catheter opening.  You have fluid or blood coming from your catheter opening.  Your catheter opening feels warm to the touch.  You have pus or a bad smell coming from your catheter opening.  You have a fever or chills.  Your urine flow slows down.  Your urine becomes cloudy or smelly. Get help right away if:  Your catheter comes out.  You feel nauseous.  You have back pain.  You have difficulty changing your catheter.  You have blood in your urine.  You have no urine flow for 1 hour. Summary  Suprapubic catheter replacement is a procedure to remove an old catheter and insert a new, clean catheter.  Make sure that you understand how to care for your catheter, your collection bag, and the opening in your abdomen.  Always wash your hands before and after caring for your catheter and collection bag.  Contact a health care provider if you leak urine or have a fever or any signs of infection around the catheter opening, or if your urine becomes cloudy or smelly.  Get help right away if your catheter comes out or you have nausea, back pain, blood in your urine, no urine flow for 1 hour, or difficulty changing  your catheter. This information is not intended to replace advice given to you by your health care provider. Make sure you discuss any questions you have with your health care provider. Document Revised: 09/04/2017 Document Reviewed: 09/04/2017 Elsevier Patient Education  2021 [employer]  

## 2020-03-29 NOTE — Progress Notes (Signed)
Cath Change/ Replacement  Patient is present today for a catheter change due to urinary retention.  84ml of water was removed from the balloon, a 20FR foley cath was removed with out difficulty.  Patient was cleaned and prepped in a sterile fashion with betadine. A 20 FR foley cath was replaced into the bladder no complications were noted Urine return was noted 55ml and urine was yellow in color. The balloon was filled with 28ml of sterile water and the cath was plugged. Patient was given proper instruction on catheter care.    Performed by: Antionette Char, Debra,Lpn  Follow up: 8 wks

## 2020-05-17 ENCOUNTER — Other Ambulatory Visit: Payer: Self-pay

## 2020-05-17 ENCOUNTER — Ambulatory Visit (INDEPENDENT_AMBULATORY_CARE_PROVIDER_SITE_OTHER): Payer: Medicare Other

## 2020-05-17 DIAGNOSIS — N312 Flaccid neuropathic bladder, not elsewhere classified: Secondary | ICD-10-CM

## 2020-05-17 NOTE — Patient Instructions (Signed)

## 2020-05-17 NOTE — Progress Notes (Signed)
Suprapubic Cath Change  Patient is present today for a suprapubic catheter change due to urinary retention.  50ml of water was drained from the balloon, a 22FR foley cath was removed from the tract with out difficulty.  Site was cleaned and prepped in a sterile fashion with betadine.  A 22FR foley cath was replaced into the tract no complications were noted. Urine return was noted, 10 ml of sterile water was inflated into the balloon and a plug  was attached for drainage.  Patient tolerated well. A night bag was given to patient and proper instruction was given on how to switch bags.    Performed by: Shandelle Borrelli, Lpn   Follow up: Keep next scheduled NV

## 2020-05-19 ENCOUNTER — Ambulatory Visit: Payer: Medicare Other

## 2020-07-04 ENCOUNTER — Ambulatory Visit: Payer: Medicare Other

## 2020-07-07 DIAGNOSIS — R7301 Impaired fasting glucose: Secondary | ICD-10-CM | POA: Diagnosis not present

## 2020-07-07 DIAGNOSIS — E782 Mixed hyperlipidemia: Secondary | ICD-10-CM | POA: Diagnosis not present

## 2020-07-11 ENCOUNTER — Other Ambulatory Visit: Payer: Self-pay

## 2020-07-11 ENCOUNTER — Ambulatory Visit (INDEPENDENT_AMBULATORY_CARE_PROVIDER_SITE_OTHER): Payer: Medicare Other

## 2020-07-11 DIAGNOSIS — N312 Flaccid neuropathic bladder, not elsewhere classified: Secondary | ICD-10-CM | POA: Diagnosis not present

## 2020-07-11 NOTE — Progress Notes (Signed)
Cath Change/ Replacement  Patient is present today for a catheter change due to urinary retention.  75ml of water was removed from the balloon, a 22FR foley cath was removed with out difficulty.  Patient was cleaned and prepped in a sterile fashion with betadine. A 22 FR foley cath was replaced into the bladder no complications were noted Urine return was noted 51ml and urine was yellow in color. The balloon was filled with 60ml of sterile water. A catheter plug was attached.  A extra catheter plug was also given to the patient.  Patient was given proper instruction on catheter care.    Performed by: Damaya Channing, LPN  Follow up: Keep next scheduled OV

## 2020-07-11 NOTE — Patient Instructions (Signed)
Foley Catheter Care and Patient Education  Perform catheter care every day.  You can do this while in the shower, but NOT while taking a tub bath.  You will need the following supplies: -mild soap, such as Dove -water -a clean washcloth (not one already used for bathing) or a 4x4 piece of gauze -1 Cath-Secure -night drainage bag -2 alcohol swabs  Was you hands thoroughly with soap and water Using mild soap and water, clan your genital area Men should retract the foreskin, if needed, and clean the area, including the penis Women should separate the labia, and clean the area from front to back  3. Clean your urinary opening, which is where the catheter enters your body. 4. Clean the catheter from where it enters your body and then down, away from your body.  Hold the catheter at the point it enters your body so that you don't put tension on it. 5. Rinse the area well and dry it gently.  Changing the drainage bag You will change your drainage bag twice a day -in the morning after you shower, change he night bag to the leg bag -at night before you go to bed, change the leg bag to the night bag  Wash your hands thoroughly with soap and water Empty the urine from the drainage bag into the toilet before you change it Pinch off the catheter with your fingers and disconnect the used bag Wipe the end of the catheter using an alcohol pad Wipe the connector on the bag using the second alcohol pad Connect the clean bag to the catheter and release your finger pinch Check all connections.  Straighten any kinks or twits in the tubing  Caring for the Leg bag -always wear the leg bag below your knee.  This will help it drain. -keep the leg bag secure with the velcro straps.  If the straps leave a mark on your leg they are to tight and should be loosened.  Leaving the straps too tight can decrease you circulation and lead to blood clots. -empty the leg bag through the spout at the bottom every  2-4 hours as needed.  Do not let the bag become completely full. -do not lie down for longer than 2 hours while you are wearing the leg bag.  Caring for the Night Bag -always keep the night bag below the level of your bladder . -to hang your night bag while you sleep, place a clean plastic bag inside of a wastebasket.  Hang the night bag on the inside of the wastebasket.  Cleaning the Drainage bag Wash you hands thoroughly with soap and water. Rinse the equipment with cool water.  Do not use hot water it can damage the plastic equipment. Was the equipment with a mild liquid detergent (ivory) and rinse with cool water. To decrease odor, fill the bag halfway with a mixture of 1 part vinegar and 3 parts water. Shake the bag and let it sit for 15 minutes. Rinse the bag with cool water and hang it up to dry.  Preventing infection -keep the drainage bag below the level of your bladder and off the floor at all times. -keep the catheter secured to your thigh to prevent it from moving. -do not lie on or block the flow of urine in the tubing. -shower daily to keep the catheter clean. -clean your hands before and after touching the catheter or bag. -the spout of the drainage bag should never touch the side of   the toilet or any emptying container.  Special Points -You may see some blood or urine around where the catheter enters your body, especially when walking or having a bowel movement.  This is normal, as long as there is urine draining into the drainage bag.  If you experience significant leakage around  catheter tube where it enters your urethra possibly associated with lower abdominal cramping you could be having a bladder spasm.  Please notify your doctor and we can prescribe you a medication to improve your symptoms. -drink 1-2 glasses of liquids every 2 hours while you're awake.  Call your doctor immediately if -your catheter comes out, do not try to replace it yourself -you have  temperature of 101F (38.8C) or higher -you have decrease in the amount of urine you are making -you have foul-smelling urine -you have bright red blood or large blood clots in your urine -you have abdominal pain and no urine in your catheter bag   

## 2020-07-14 DIAGNOSIS — E782 Mixed hyperlipidemia: Secondary | ICD-10-CM | POA: Diagnosis not present

## 2020-07-14 DIAGNOSIS — F5221 Male erectile disorder: Secondary | ICD-10-CM | POA: Diagnosis not present

## 2020-07-14 DIAGNOSIS — N319 Neuromuscular dysfunction of bladder, unspecified: Secondary | ICD-10-CM | POA: Diagnosis not present

## 2020-07-14 DIAGNOSIS — I10 Essential (primary) hypertension: Secondary | ICD-10-CM | POA: Diagnosis not present

## 2020-07-14 DIAGNOSIS — K219 Gastro-esophageal reflux disease without esophagitis: Secondary | ICD-10-CM | POA: Diagnosis not present

## 2020-07-14 DIAGNOSIS — E208 Other hypoparathyroidism: Secondary | ICD-10-CM | POA: Diagnosis not present

## 2020-07-14 DIAGNOSIS — D638 Anemia in other chronic diseases classified elsewhere: Secondary | ICD-10-CM | POA: Diagnosis not present

## 2020-07-14 DIAGNOSIS — R7301 Impaired fasting glucose: Secondary | ICD-10-CM | POA: Diagnosis not present

## 2020-07-14 DIAGNOSIS — N185 Chronic kidney disease, stage 5: Secondary | ICD-10-CM | POA: Diagnosis not present

## 2020-07-14 DIAGNOSIS — E876 Hypokalemia: Secondary | ICD-10-CM | POA: Diagnosis not present

## 2020-08-18 DIAGNOSIS — Z1283 Encounter for screening for malignant neoplasm of skin: Secondary | ICD-10-CM | POA: Diagnosis not present

## 2020-08-18 DIAGNOSIS — Z08 Encounter for follow-up examination after completed treatment for malignant neoplasm: Secondary | ICD-10-CM | POA: Diagnosis not present

## 2020-08-18 DIAGNOSIS — L859 Epidermal thickening, unspecified: Secondary | ICD-10-CM | POA: Diagnosis not present

## 2020-08-18 DIAGNOSIS — X32XXXD Exposure to sunlight, subsequent encounter: Secondary | ICD-10-CM | POA: Diagnosis not present

## 2020-08-18 DIAGNOSIS — Z85828 Personal history of other malignant neoplasm of skin: Secondary | ICD-10-CM | POA: Diagnosis not present

## 2020-08-18 DIAGNOSIS — L570 Actinic keratosis: Secondary | ICD-10-CM | POA: Diagnosis not present

## 2020-08-18 DIAGNOSIS — B078 Other viral warts: Secondary | ICD-10-CM | POA: Diagnosis not present

## 2020-08-25 DIAGNOSIS — D631 Anemia in chronic kidney disease: Secondary | ICD-10-CM | POA: Diagnosis not present

## 2020-08-25 DIAGNOSIS — N2581 Secondary hyperparathyroidism of renal origin: Secondary | ICD-10-CM | POA: Diagnosis not present

## 2020-08-25 DIAGNOSIS — N189 Chronic kidney disease, unspecified: Secondary | ICD-10-CM | POA: Diagnosis not present

## 2020-08-25 DIAGNOSIS — N319 Neuromuscular dysfunction of bladder, unspecified: Secondary | ICD-10-CM | POA: Diagnosis not present

## 2020-08-25 DIAGNOSIS — N185 Chronic kidney disease, stage 5: Secondary | ICD-10-CM | POA: Diagnosis not present

## 2020-08-25 DIAGNOSIS — I120 Hypertensive chronic kidney disease with stage 5 chronic kidney disease or end stage renal disease: Secondary | ICD-10-CM | POA: Diagnosis not present

## 2020-09-06 ENCOUNTER — Ambulatory Visit (INDEPENDENT_AMBULATORY_CARE_PROVIDER_SITE_OTHER): Payer: Medicare Other | Admitting: Urology

## 2020-09-06 ENCOUNTER — Other Ambulatory Visit: Payer: Self-pay

## 2020-09-06 ENCOUNTER — Encounter: Payer: Self-pay | Admitting: Urology

## 2020-09-06 DIAGNOSIS — N312 Flaccid neuropathic bladder, not elsewhere classified: Secondary | ICD-10-CM

## 2020-09-06 NOTE — Progress Notes (Signed)

## 2020-09-06 NOTE — Progress Notes (Signed)
History of Present Illness:    He had previously seen Dr. Nelma Rothman, as well as Alvin Critchley and Jimmey Ralph . He has a suprapubic tube which was placed for a neurogenic bladder in July of 2010. Prior to presentation in 2010, he was seen by urologist in [place]. When he presented here, he did have hydronephrosis of the right kidney, as well as chronic UTIs and bladder diverticulae. His hydronephrosis resolved with adequate bladder drainage.   He has an indwelling suprapubic tube.  He has been managing this with just unplugging to drain.  He has this changed every couple of months.  He will be getting vascular access in the near future.        Past Medical History:  Diagnosis Date   Anemia    Chronic kidney disease    Dysrhythmia    GERD (gastroesophageal reflux disease)    Neurogenic bladder     Past Surgical History:  Procedure Laterality Date   CYSTOURETHROSCOPY     SUPRAPUBIC CATHETER PLACEMENT      Home Medications:  Allergies as of 09/06/2020   No Known Allergies      Medication List        Accurate as of September 06, 2020  8:19 AM. If you have any questions, ask your nurse or doctor.          alfuzosin 10 MG 24 hr tablet Commonly known as: UROXATRAL Take 10 mg by mouth once.   aspirin EC 81 MG tablet Take 81 mg by mouth daily.   colchicine 0.6 MG tablet Take 0.6 mg by mouth daily.   NIFEdipine 60 MG 24 hr tablet Commonly known as: ADALAT CC Take 60 mg by mouth daily.   omeprazole 20 MG capsule Commonly known as: PRILOSEC Take 20 mg by mouth daily.   peg 3350 powder 100 g Solr Commonly known as: MoviPrep Take 1 kit (100 g total) by mouth once.   simvastatin 40 MG tablet Commonly known as: ZOCOR Take 40 mg by mouth daily.        Allergies: No Known Allergies  No family history on file.  Social History:  reports that he has never smoked. He has never used smokeless tobacco. He reports that he does not drink alcohol and does  not use drugs.  ROS: A complete review of systems was performed.  All systems are negative except for pertinent findings as noted.  Physical Exam:  Vital signs in last 24 hours: There were no vitals taken for this visit. Constitutional:  Alert and oriented, No acute distress Cardiovascular: Regular rate  Respiratory: Normal respiratory effort GI: Abdomen is soft, nontender, nondistended, no abdominal masses. No CVAT.  Suprapubic site clean, no granulation tissue. Lymphatic: No lymphadenopathy Neurologic: Grossly intact, no focal deficits Psychiatric: Normal mood and affect  I have reviewed prior pt notes  I have reviewed notes from referring/previous physicians  Cystoscopy: This is done through suprapubic site.  Bladder revealed normal urothelium.  Significant cellule/saccules throughout.  Mild amount of sediment but no stones.  Ureteral orifice ease were not easily seen.  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  Patient has indwelling suprapubic tube which he by draining intermittently.  This is changed every couple of months.  Today cystoscopy basically normal.  Plan:  He will continue every other month catheter changes here  I will see back in 1 year for cystoscopy

## 2020-09-07 ENCOUNTER — Ambulatory Visit (INDEPENDENT_AMBULATORY_CARE_PROVIDER_SITE_OTHER): Payer: Medicare Other | Admitting: Vascular Surgery

## 2020-09-07 ENCOUNTER — Other Ambulatory Visit: Payer: Self-pay

## 2020-09-07 ENCOUNTER — Telehealth: Payer: Self-pay

## 2020-09-07 ENCOUNTER — Encounter: Payer: Self-pay | Admitting: Vascular Surgery

## 2020-09-07 VITALS — BP 107/63 | HR 96 | Temp 97.7°F | Ht 72.0 in | Wt 178.0 lb

## 2020-09-07 DIAGNOSIS — N186 End stage renal disease: Secondary | ICD-10-CM

## 2020-09-07 NOTE — Telephone Encounter (Signed)
Spoke with [facility] at [facility] to confirm timing and surgery for patient. Per Dr. Justin Mend, patient does not need a TDC and is okay to wait until 8/30 to have surgery with TFE in [place]. Will get patient on the schedule for surgery.

## 2020-09-07 NOTE — Progress Notes (Signed)
[facility] [place]  Patient name: Glenn Olson MRN: [id number] DOB: 28-Jun-1940 Sex: male  REASON FOR CONSULT: Discuss access for hemodialysis  Nephrologist: Justin Mend  HPI: Glenn Olson is a 80 y.o. male, who is referred for tunneled catheter placement for immediate hemodialysis and arm access for chronic dialysis.  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  Has a long history of suprapubic tube.  He otherwise is healthy.  He has not on any anticoagulation.  No history of coronary artery disease.  He has not had a pacemaker.  He is right-handed.  He is retired and lives next door to the house he grew up in in [place].  Past Medical History:  Diagnosis Date   Anemia    Chronic kidney disease    Dysrhythmia    GERD (gastroesophageal reflux disease)    Neurogenic bladder     History reviewed. No pertinent family history.  SOCIAL HISTORY: Social History   Socioeconomic History   Marital status: Divorced    Spouse name: Not on file   Number of children: Not on file   Years of education: Not on file   Highest education level: Not on file  Occupational History   Not on file  Tobacco Use   Smoking status: Never   Smokeless tobacco: Never  Vaping Use   Vaping Use: Never used  Substance and Sexual Activity   Alcohol use: No   Drug use: No   Sexual activity: Not on file  Other Topics Concern   Not on file  Social History Narrative   Not on file   Social Determinants of Health   Financial Resource Strain: Not on file  Food Insecurity: Not on file  Transportation Needs: Not on file  Physical Activity: Not on file  Stress: Not on file  Social Connections: Not on file  Intimate Partner Violence: Not on file    No Known Allergies  Current Outpatient Medications  Medication Sig Dispense Refill   alfuzosin (UROXATRAL) 10 MG 24 hr tablet Take 10 mg by mouth once.     aspirin  EC 81 MG tablet Take 81 mg by mouth daily.     colchicine 0.6 MG tablet Take 0.6 mg by mouth daily.     NIFEdipine (PROCARDIA-XL/ADALAT CC) 60 MG 24 hr tablet Take 60 mg by mouth daily.     omeprazole (PRILOSEC) 20 MG capsule Take 20 mg by mouth daily.     peg 3350 powder (MOVIPREP) 100 G SOLR Take 1 kit (100 g total) by mouth once. 1 kit 0   simvastatin (ZOCOR) 40 MG tablet Take 40 mg by mouth daily.     No current facility-administered medications for this visit.    REVIEW OF SYSTEMS:  $RemoveB'[X]'ElbYoKrM$  denotes positive finding, $RemoveBeforeDEI'[ ]'agoRkBWxvZGVGwIz$  denotes negative finding Cardiac  Comments:  Chest pain or chest pressure:    Shortness of breath upon exertion:    Short of breath when lying flat:    Irregular heart rhythm:        Vascular    Pain in calf, thigh, or hip brought on by ambulation:    Pain in feet at night that wakes you up from your sleep:     Blood clot in your veins:    Leg swelling:         Pulmonary    Oxygen at home:    Productive cough:     Wheezing:  Neurologic    Sudden weakness in arms or legs:     Sudden numbness in arms or legs:     Sudden onset of difficulty speaking or slurred speech:    Temporary loss of vision in one eye:     Problems with dizziness:         Gastrointestinal    Blood in stool:     Vomited blood:         Genitourinary    Burning when urinating:     Blood in urine:        Psychiatric    Major depression:         Hematologic    Bleeding problems:    Problems with blood clotting too easily:        Skin    Rashes or ulcers:        Constitutional    Fever or chills:      PHYSICAL EXAM: Vitals:   09/07/20 0852  BP: 107/63  Pulse: 96  Temp: 97.7 F (36.5 C)  TempSrc: Temporal  SpO2: 98%  Weight: 178 lb (80.7 kg)  Height: 6' (1.829 m)    GENERAL: The patient is a well-nourished male, in no acute distress. The vital signs are documented above. CARDIOVASCULAR: 2+ radial pulses bilaterally.  Small surface veins by physical  exam PULMONARY: There is good air exchange  MUSCULOSKELETAL: There are no major deformities or cyanosis. NEUROLOGIC: No focal weakness or paresthesias are detected. SKIN: There are no ulcers or rashes noted. PSYCHIATRIC: The patient has a normal affect.  DATA:  I imaged his surface veins in his right and left arm with SonoSite ultrasound.  He has extremely small cephalic and basilic veins bilaterally  MEDICAL ISSUES: Had a long discussion with the patient regarding access for hemodialysis and options.  I explained the placement of a tunneled hemodialysis catheter for immediate access and also options for arm access.  I discussed AV fistula and AV graft placement.  Discussed the advantages disadvantages of these and also discussed the potential for steal phenomenon.  His veins are very small and I do not feel that he will be a fistula candidate.  I discussed placement of prosthetic Gore-Tex graft and the expectation of thrombosis and maintenance required.  We will check timing with Dr. Justin Mend.  I am not available to do this surgery in [place] until August 30.  If he needs placement before this we will schedule this in [place].  Discussed this with the patient as well.   Rosetta Posner, MD FACS [facility] [phone] Pager [phone]  Note: Portions of this report may have been transcribed using voice recognition software.  Every effort has been made to ensure accuracy; however, inadvertent computerized transcription errors may still be present.

## 2020-09-07 NOTE — H&P (View-Only) (Signed)
[facility] [place]  Patient name: Glenn Olson MRN: [id number] DOB: 1940/01/30 Sex: male  REASON FOR CONSULT: Discuss access for hemodialysis  Nephrologist: Justin Mend  HPI: Glenn Olson is a 80 y.o. male, who is referred for tunneled catheter placement for immediate hemodialysis and arm access for chronic dialysis.  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  Has a long history of suprapubic tube.  He otherwise is healthy.  He has not on any anticoagulation.  No history of coronary artery disease.  He has not had a pacemaker.  He is right-handed.  He is retired and lives next door to the house he grew up in in [place].  Past Medical History:  Diagnosis Date   Anemia    Chronic kidney disease    Dysrhythmia    GERD (gastroesophageal reflux disease)    Neurogenic bladder     History reviewed. No pertinent family history.  SOCIAL HISTORY: Social History   Socioeconomic History   Marital status: Divorced    Spouse name: Not on file   Number of children: Not on file   Years of education: Not on file   Highest education level: Not on file  Occupational History   Not on file  Tobacco Use   Smoking status: Never   Smokeless tobacco: Never  Vaping Use   Vaping Use: Never used  Substance and Sexual Activity   Alcohol use: No   Drug use: No   Sexual activity: Not on file  Other Topics Concern   Not on file  Social History Narrative   Not on file   Social Determinants of Health   Financial Resource Strain: Not on file  Food Insecurity: Not on file  Transportation Needs: Not on file  Physical Activity: Not on file  Stress: Not on file  Social Connections: Not on file  Intimate Partner Violence: Not on file    No Known Allergies  Current Outpatient Medications  Medication Sig Dispense Refill   alfuzosin (UROXATRAL) 10 MG 24 hr tablet Take 10 mg by mouth once.     aspirin  EC 81 MG tablet Take 81 mg by mouth daily.     colchicine 0.6 MG tablet Take 0.6 mg by mouth daily.     NIFEdipine (PROCARDIA-XL/ADALAT CC) 60 MG 24 hr tablet Take 60 mg by mouth daily.     omeprazole (PRILOSEC) 20 MG capsule Take 20 mg by mouth daily.     peg 3350 powder (MOVIPREP) 100 G SOLR Take 1 kit (100 g total) by mouth once. 1 kit 0   simvastatin (ZOCOR) 40 MG tablet Take 40 mg by mouth daily.     No current facility-administered medications for this visit.    REVIEW OF SYSTEMS:  $RemoveB'[X]'vKVSrxCd$  denotes positive finding, $RemoveBeforeDEI'[ ]'[id number]$  denotes negative finding Cardiac  Comments:  Chest pain or chest pressure:    Shortness of breath upon exertion:    Short of breath when lying flat:    Irregular heart rhythm:        Vascular    Pain in calf, thigh, or hip brought on by ambulation:    Pain in feet at night that wakes you up from your sleep:     Blood clot in your veins:    Leg swelling:         Pulmonary    Oxygen at home:    Productive cough:     Wheezing:  Neurologic    Sudden weakness in arms or legs:     Sudden numbness in arms or legs:     Sudden onset of difficulty speaking or slurred speech:    Temporary loss of vision in one eye:     Problems with dizziness:         Gastrointestinal    Blood in stool:     Vomited blood:         Genitourinary    Burning when urinating:     Blood in urine:        Psychiatric    Major depression:         Hematologic    Bleeding problems:    Problems with blood clotting too easily:        Skin    Rashes or ulcers:        Constitutional    Fever or chills:      PHYSICAL EXAM: Vitals:   09/07/20 0852  BP: 107/63  Pulse: 96  Temp: 97.7 F (36.5 C)  TempSrc: Temporal  SpO2: 98%  Weight: 178 lb (80.7 kg)  Height: 6' (1.829 m)    GENERAL: The patient is a well-nourished male, in no acute distress. The vital signs are documented above. CARDIOVASCULAR: 2+ radial pulses bilaterally.  Small surface veins by physical  exam PULMONARY: There is good air exchange  MUSCULOSKELETAL: There are no major deformities or cyanosis. NEUROLOGIC: No focal weakness or paresthesias are detected. SKIN: There are no ulcers or rashes noted. PSYCHIATRIC: The patient has a normal affect.  DATA:  I imaged his surface veins in his right and left arm with SonoSite ultrasound.  He has extremely small cephalic and basilic veins bilaterally  MEDICAL ISSUES: Had a long discussion with the patient regarding access for hemodialysis and options.  I explained the placement of a tunneled hemodialysis catheter for immediate access and also options for arm access.  I discussed AV fistula and AV graft placement.  Discussed the advantages disadvantages of these and also discussed the potential for steal phenomenon.  His veins are very small and I do not feel that he will be a fistula candidate.  I discussed placement of prosthetic Gore-Tex graft and the expectation of thrombosis and maintenance required.  We will check timing with Dr. Justin Mend.  I am not available to do this surgery in [place] until August 30.  If he needs placement before this we will schedule this in [place].  Discussed this with the patient as well.   Rosetta Posner, MD FACS [facility] [phone] Pager [phone]  Note: Portions of this report may have been transcribed using voice recognition software.  Every effort has been made to ensure accuracy; however, inadvertent computerized transcription errors may still be present.

## 2020-09-08 ENCOUNTER — Other Ambulatory Visit: Payer: Self-pay

## 2020-09-23 ENCOUNTER — Other Ambulatory Visit: Payer: Self-pay

## 2020-09-23 ENCOUNTER — Encounter (HOSPITAL_COMMUNITY)
Admission: RE | Admit: 2020-09-23 | Discharge: 2020-09-23 | Disposition: A | Discharge status: Home / Self Care | Payer: Medicare Other | Source: Ambulatory Visit | Attending: Vascular Surgery | Admitting: Vascular Surgery

## 2020-09-26 NOTE — Pre-Procedure Instructions (Signed)
Multiple attempts have been made to reach patient via phone to do preop information. His home number either rings with no voice mail or states party is unavailable. His son's number a woman answered and told us it was a wrong number, Allyne Gee, RN at Dr Luther Parody office messaged to see what we need to do next.

## 2020-09-26 NOTE — Pre-Procedure Instructions (Signed)
RE: Unable to contact Received: Today Al[place], [place], RN  Steffani Dionisio, Antionette Fairy, RN Looks like I spoke to [place] at [employer] on 8/10. From my note - I think I scheduled it with her and not the patient. I can't remember. But I did call the kidney center and left a detailed VM on her extension to see if she's confirmed information with patient or she has a better way to contact him.  Thanks,  Art therapist        Previous Messages   ----- Message -----  From: Encarnacion Chu, RN  Sent: 09/26/2020  10:33 AM EDT  To: Allyne Gee, RN  Subject: RE: Unable to contact                           Thank you!  ----- Message -----  From: Allyne Gee, RN  Sent: 09/26/2020  10:16 AM EDT  To: Encarnacion Chu, RN  Subject: RE: Unable to contact                           I'll see if I can get him and I'll let you know   ----- Message -----  From: Encarnacion Chu, RN  Sent: 09/26/2020   9:35 AM EDT  To: Allyne Gee, RN  Subject: Unable to contact                               Good morning Alex. My name is Delphia Grates and I am one of the Preop testing nurses at [facility]. We have been trying to contact Mr Vanderzee since Friday for his procedure tomorrow. His home number [phone] has been either ringing and has no voicemail or just now states "This party cannot be reached at this time". His son's number when called, a woman answers and says the number we are calling is a "wrong number". Help! How can I get in touch with him to make sure he is aware of arrival time, NPO and meds to take tomorrow before his procedure?

## 2020-09-27 ENCOUNTER — Ambulatory Visit (HOSPITAL_COMMUNITY)
Admission: RE | Admit: 2020-09-27 | Discharge: 2020-09-27 | Disposition: A | Discharge status: Home / Self Care | Payer: Medicare Other | Attending: Vascular Surgery | Admitting: Vascular Surgery

## 2020-09-27 ENCOUNTER — Ambulatory Visit (HOSPITAL_COMMUNITY): Payer: Medicare Other | Admitting: Anesthesiology

## 2020-09-27 ENCOUNTER — Other Ambulatory Visit: Payer: Self-pay

## 2020-09-27 ENCOUNTER — Other Ambulatory Visit: Payer: Medicare Other | Admitting: Urology

## 2020-09-27 ENCOUNTER — Encounter (HOSPITAL_COMMUNITY)
Admission: RE | Disposition: A | Discharge status: Home / Self Care | Payer: Self-pay | Source: Home / Self Care | Attending: Vascular Surgery

## 2020-09-27 ENCOUNTER — Ambulatory Visit (HOSPITAL_COMMUNITY)
Admission: RE | Admit: 2020-09-27 | Discharge: 2020-09-27 | Disposition: A | Discharge status: Home / Self Care | Payer: Medicare Other | Source: Home / Self Care | Attending: Vascular Surgery | Admitting: Vascular Surgery

## 2020-09-27 ENCOUNTER — Ambulatory Visit (HOSPITAL_COMMUNITY): Payer: Medicare Other

## 2020-09-27 DIAGNOSIS — N186 End stage renal disease: Secondary | ICD-10-CM | POA: Insufficient documentation

## 2020-09-27 DIAGNOSIS — Z7982 Long term (current) use of aspirin: Secondary | ICD-10-CM | POA: Diagnosis not present

## 2020-09-27 DIAGNOSIS — Z79899 Other long term (current) drug therapy: Secondary | ICD-10-CM | POA: Diagnosis not present

## 2020-09-27 DIAGNOSIS — J90 Pleural effusion, not elsewhere classified: Secondary | ICD-10-CM | POA: Diagnosis not present

## 2020-09-27 DIAGNOSIS — I4510 Unspecified right bundle-branch block: Secondary | ICD-10-CM | POA: Diagnosis not present

## 2020-09-27 DIAGNOSIS — N319 Neuromuscular dysfunction of bladder, unspecified: Secondary | ICD-10-CM | POA: Insufficient documentation

## 2020-09-27 DIAGNOSIS — Z992 Dependence on renal dialysis: Secondary | ICD-10-CM | POA: Diagnosis not present

## 2020-09-27 HISTORY — PX: INSERTION OF DIALYSIS CATHETER: SHX1324

## 2020-09-27 HISTORY — PX: AV FISTULA PLACEMENT: SHX1204

## 2020-09-27 LAB — POCT I-STAT, CHEM 8
BUN: 47 mg/dL — ABNORMAL HIGH (ref 8–23)
Calcium, Ion: 1.16 mmol/L (ref 1.15–1.40)
Chloride: 113 mmol/L — ABNORMAL HIGH (ref 98–111)
Creatinine, Ser: 5.9 mg/dL — ABNORMAL HIGH (ref 0.61–1.24)
Glucose, Bld: 86 mg/dL (ref 70–99)
HCT: 36 % — ABNORMAL LOW (ref 39.0–52.0)
Hemoglobin: 12.2 g/dL — ABNORMAL LOW (ref 13.0–17.0)
Potassium: 3.2 mmol/L — ABNORMAL LOW (ref 3.5–5.1)
Sodium: 142 mmol/L (ref 135–145)
TCO2: 17 mmol/L — ABNORMAL LOW (ref 22–32)

## 2020-09-27 SURGERY — INSERTION OF ARTERIOVENOUS (AV) GORE-TEX GRAFT ARM
Anesthesia: General | Site: Chest | Laterality: Right

## 2020-09-27 MED ORDER — PROPOFOL 10 MG/ML IV BOLUS
INTRAVENOUS | Status: AC
  Filled 2020-09-27: qty 40

## 2020-09-27 MED ORDER — HEPARIN 6000 UNIT IRRIGATION SOLUTION
Status: DC | PRN
  Administered 2020-09-27: 1

## 2020-09-27 MED ORDER — MIDAZOLAM HCL 5 MG/5ML IJ SOLN
INTRAMUSCULAR | Status: DC | PRN
  Administered 2020-09-27 (×2): 1 mg via INTRAVENOUS

## 2020-09-27 MED ORDER — ORAL CARE MOUTH RINSE: 15.0000 mL | Freq: Once | OROMUCOSAL | Status: AC

## 2020-09-27 MED ORDER — MIDAZOLAM HCL 2 MG/2ML IJ SOLN
INTRAMUSCULAR | Status: AC
  Filled 2020-09-27: qty 2

## 2020-09-27 MED ORDER — HEPARIN SODIUM (PORCINE) 1000 UNIT/ML IJ SOLN
INTRAMUSCULAR | Status: AC
  Filled 2020-09-27: qty 10

## 2020-09-27 MED ORDER — KETAMINE HCL 10 MG/ML IJ SOLN
INTRAMUSCULAR | Status: DC | PRN
  Administered 2020-09-27 (×2): 5 mg via INTRAVENOUS
  Administered 2020-09-27: 10 mg via INTRAVENOUS
  Administered 2020-09-27 (×6): 5 mg via INTRAVENOUS

## 2020-09-27 MED ORDER — SODIUM CHLORIDE 0.9 % IV SOLN: INTRAVENOUS | Status: DC

## 2020-09-27 MED ORDER — CHLORHEXIDINE GLUCONATE 4 % EX LIQD: 60.0000 mL | Freq: Once | CUTANEOUS | Status: DC

## 2020-09-27 MED ORDER — HEPARIN SODIUM (PORCINE) 1000 UNIT/ML IJ SOLN
INTRAMUSCULAR | Status: DC | PRN
  Administered 2020-09-27: 4000 [IU]

## 2020-09-27 MED ORDER — CEFAZOLIN SODIUM-DEXTROSE 2-4 GM/100ML-% IV SOLN: 2.0000 g | INTRAVENOUS | Status: DC

## 2020-09-27 MED ORDER — 0.9 % SODIUM CHLORIDE (POUR BTL) OPTIME
TOPICAL | Status: DC | PRN
  Administered 2020-09-27: 1000 mL

## 2020-09-27 MED ORDER — OXYCODONE-ACETAMINOPHEN 5-325 MG PO TABS: 1.0000 | ORAL_TABLET | Freq: Four times a day (QID) | ORAL | 0 refills | Status: DC | PRN

## 2020-09-27 MED ORDER — ONDANSETRON HCL 4 MG/2ML IJ SOLN: 4.0000 mg | Freq: Once | INTRAMUSCULAR | Status: DC | PRN

## 2020-09-27 MED ORDER — FENTANYL CITRATE PF 50 MCG/ML IJ SOSY: 25.0000 ug | PREFILLED_SYRINGE | INTRAMUSCULAR | Status: DC | PRN

## 2020-09-27 MED ORDER — PROPOFOL 10 MG/ML IV BOLUS
INTRAVENOUS | Status: DC | PRN
  Administered 2020-09-27: 30 mg via INTRAVENOUS
  Administered 2020-09-27: 35 ug/kg/min via INTRAVENOUS
  Administered 2020-09-27: 40 mg via INTRAVENOUS
  Administered 2020-09-27: 25 ug/kg/min via INTRAVENOUS
  Administered 2020-09-27: 20 mg via INTRAVENOUS

## 2020-09-27 MED ORDER — CHLORHEXIDINE GLUCONATE 0.12 % MT SOLN
OROMUCOSAL | Status: AC
  Administered 2020-09-27: 15 mL via OROMUCOSAL
  Filled 2020-09-27: qty 15

## 2020-09-27 MED ORDER — LIDOCAINE-EPINEPHRINE 0.5 %-1:200000 IJ SOLN
INTRAMUSCULAR | Status: DC | PRN
  Administered 2020-09-27: 20 mL

## 2020-09-27 MED ORDER — FENTANYL CITRATE (PF) 100 MCG/2ML IJ SOLN
INTRAMUSCULAR | Status: AC
  Filled 2020-09-27: qty 2

## 2020-09-27 MED ORDER — CEFAZOLIN SODIUM-DEXTROSE 2-4 GM/100ML-% IV SOLN
INTRAVENOUS | Status: AC
  Filled 2020-09-27: qty 100

## 2020-09-27 MED ORDER — LIDOCAINE-EPINEPHRINE 0.5 %-1:200000 IJ SOLN
INTRAMUSCULAR | Status: AC
  Filled 2020-09-27: qty 1

## 2020-09-27 MED ORDER — CHLORHEXIDINE GLUCONATE 0.12 % MT SOLN: 15.0000 mL | Freq: Once | OROMUCOSAL | Status: AC

## 2020-09-27 MED ORDER — KETAMINE HCL 10 MG/ML IJ SOLN
INTRAMUSCULAR | Status: AC
  Filled 2020-09-27: qty 1

## 2020-09-27 MED ORDER — FENTANYL CITRATE (PF) 100 MCG/2ML IJ SOLN
INTRAMUSCULAR | Status: DC | PRN
  Administered 2020-09-27 (×2): 25 ug via INTRAVENOUS

## 2020-09-27 SURGICAL SUPPLY — 58 items
ADH SKN CLS APL DERMABOND .7 (GAUZE/BANDAGES/DRESSINGS) ×4
ARMBAND PINK RESTRICT EXTREMIT (MISCELLANEOUS) ×3 IMPLANT
BAG HAMPER (MISCELLANEOUS) ×3 IMPLANT
BIOPATCH RED 1 DISK 7.0 (GAUZE/BANDAGES/DRESSINGS) ×3 IMPLANT
CANNULA VESSEL 3MM 2 BLNT TIP (CANNULA) ×3 IMPLANT
CATH PALINDROME-P 23CM W/VT (CATHETERS) ×3 IMPLANT
CLIP LIGATING EXTRA MED SLVR (CLIP) ×3 IMPLANT
CLIP LIGATING EXTRA SM BLUE (MISCELLANEOUS) ×3 IMPLANT
COVER LIGHT HANDLE STERIS (MISCELLANEOUS) ×6 IMPLANT
COVER MAYO STAND XLG (MISCELLANEOUS) ×3 IMPLANT
COVER PROBE U/S 5X48 (MISCELLANEOUS) ×3 IMPLANT
COVER SURGICAL LIGHT HANDLE (MISCELLANEOUS) ×3 IMPLANT
DECANTER SPIKE VIAL GLASS SM (MISCELLANEOUS) ×3 IMPLANT
DERMABOND ADVANCED (GAUZE/BANDAGES/DRESSINGS) ×2
DERMABOND ADVANCED .7 DNX12 (GAUZE/BANDAGES/DRESSINGS) ×4 IMPLANT
DRAPE C-ARM FOLDED MOBILE STRL (DRAPES) ×3 IMPLANT
DRAPE CHEST BREAST 15X10 FENES (DRAPES) ×3 IMPLANT
ELECT REM PT RETURN 9FT ADLT (ELECTROSURGICAL) ×3
ELECTRODE REM PT RTRN 9FT ADLT (ELECTROSURGICAL) ×2 IMPLANT
GAUZE 4X4 16PLY ~~LOC~~+RFID DBL (SPONGE) ×3 IMPLANT
GAUZE SPONGE 4X4 12PLY STRL (GAUZE/BANDAGES/DRESSINGS) ×6 IMPLANT
GEL ULTRASOUND 20GR AQUASONIC (MISCELLANEOUS) ×3 IMPLANT
GLOVE SS BIOGEL STRL SZ 7.5 (GLOVE) ×2 IMPLANT
GLOVE SUPERSENSE BIOGEL SZ 7.5 (GLOVE) ×1
GLOVE SURG UNDER POLY LF SZ7 (GLOVE) ×9 IMPLANT
GOWN STRL REUS W/TWL LRG LVL3 (GOWN DISPOSABLE) ×12 IMPLANT
GRAFT GORETEX STRT 4-7X45 (Vascular Products) ×3 IMPLANT
IV NS 500ML (IV SOLUTION) ×6
IV NS 500ML BAXH (IV SOLUTION) ×4 IMPLANT
KIT BLADEGUARD II DBL (SET/KITS/TRAYS/PACK) ×3 IMPLANT
KIT TURNOVER KIT A (KITS) ×3 IMPLANT
MANIFOLD NEPTUNE II (INSTRUMENTS) ×3 IMPLANT
MARKER SKIN DUAL TIP RULER LAB (MISCELLANEOUS) ×6 IMPLANT
NEEDLE 18GX1X1/2 (RX/OR ONLY) (NEEDLE) ×3 IMPLANT
NEEDLE 22X1 1/2 (OR ONLY) (NEEDLE) IMPLANT
NEEDLE HYPO 18GX1.5 BLUNT FILL (NEEDLE) ×6 IMPLANT
NEEDLE HYPO 25GX1X1/2 BEV (NEEDLE) ×3 IMPLANT
NS IRRIG 1000ML POUR BTL (IV SOLUTION) ×3 IMPLANT
PACK CV ACCESS (CUSTOM PROCEDURE TRAY) ×3 IMPLANT
PACK SURGICAL SETUP 50X90 (CUSTOM PROCEDURE TRAY) ×3 IMPLANT
PAD ARMBOARD 7.5X6 YLW CONV (MISCELLANEOUS) ×6 IMPLANT
SET BASIN LINEN APH (SET/KITS/TRAYS/PACK) ×3 IMPLANT
SOAP 2 % CHG 4 OZ (WOUND CARE) ×3 IMPLANT
SOL PREP POV-IOD 4OZ 10% (MISCELLANEOUS) ×3 IMPLANT
SOL PREP PROV IODINE SCRUB 4OZ (MISCELLANEOUS) ×3 IMPLANT
SUT ETHILON 3 0 PS 1 (SUTURE) ×3 IMPLANT
SUT PROLENE 6 0 CC (SUTURE) ×6 IMPLANT
SUT SILK 2 0 PERMA HAND 18 BK (SUTURE) ×3 IMPLANT
SUT VIC AB 3-0 SH 27 (SUTURE) ×3
SUT VIC AB 3-0 SH 27X BRD (SUTURE) ×2 IMPLANT
SUT VICRYL 4-0 PS2 18IN ABS (SUTURE) ×3 IMPLANT
SYR 10ML LL (SYRINGE) ×3 IMPLANT
SYR 5ML LL (SYRINGE) ×6 IMPLANT
SYR BULB IRRIG 60ML STRL (SYRINGE) ×3 IMPLANT
SYR CONTROL 10ML LL (SYRINGE) ×3 IMPLANT
TOWEL GREEN STERILE (TOWEL DISPOSABLE) ×3 IMPLANT
UNDERPAD 30X36 HEAVY ABSORB (UNDERPADS AND DIAPERS) ×3 IMPLANT
WATER STERILE IRR 1000ML POUR (IV SOLUTION) ×3 IMPLANT

## 2020-09-27 NOTE — Transfer of Care (Signed)
Immediate Anesthesia Transfer of Care Note  Patient: Glenn Olson  Procedure(s) Performed: LEFT ARM ARTERIOVENOUS GRAFT PLACEMENT USING GORE-TEX 4-7MM X 45CM  VASCULAR GRAFT (Left: Arm Lower) INSERTION OF PALINDROME TUNNELED DIALYSIS CATHETER 14.29f X 23CM (Right: Chest)  Patient Location: PACU  Anesthesia Type:General  Level of Consciousness: awake, alert , patient cooperative and responds to stimulation  Airway & Oxygen Therapy: Patient Spontanous Breathing  Post-op Assessment: Report given to RN, Post -op Vital signs reviewed and stable and Patient moving all extremities X 4  Post vital signs: Reviewed and stable  Last Vitals:  Vitals Value Taken Time  BP 116/66 09/27/20 1140  Temp    Pulse 70 09/27/20 1141  Resp 24 09/27/20 1141  SpO2 95 % 09/27/20 1141  Vitals shown include unvalidated device data.  Last Pain:  Vitals:   09/27/20 0809  TempSrc: Oral  PainSc: 0-No pain         Complications: No notable events documented.

## 2020-09-27 NOTE — Anesthesia Preprocedure Evaluation (Signed)
Anesthesia Evaluation  Patient identified by MRN, date of birth, ID band Patient awake    Reviewed: Allergy & Precautions, NPO status , Patient's Chart, lab work & pertinent test results  Airway Mallampati: II  TM Distance: >3 FB Neck ROM: Full    Dental  (+) Edentulous Upper, Edentulous Lower   Pulmonary neg pulmonary ROS,    Pulmonary exam normal breath sounds clear to auscultation       Cardiovascular Exercise Tolerance: Good Normal cardiovascular exam+ dysrhythmias  Rhythm:Regular Rate:Normal  27-Sep-2020 08:01:33 [facility]-AP-ICU ROUTINE RECORD Mar 14, 1940 (15 yr) Male Vent. rate 82 BPM PR interval 186 ms QRS duration 136 ms QT/QTcB 432/504 ms P-R-T axes 33 -74 54 Normal sinus rhythm Right bundle branch block Left anterior fascicular block Bifascicular block  Abnormal ECG   Neuro/Psych negative neurological ROS  negative psych ROS   GI/Hepatic GERD  Medicated and Controlled,  Endo/Other  negative endocrine ROS  Renal/GU Renal Insufficiency and ESRFRenal disease     Musculoskeletal negative musculoskeletal ROS (+)   Abdominal   Peds  Hematology  (+) anemia ,   Anesthesia Other Findings   Reproductive/Obstetrics negative OB ROS                            Anesthesia Physical Anesthesia Plan  ASA: 4  Anesthesia Plan: General   Post-op Pain Management:    Induction: Intravenous  PONV Risk Score and Plan: 3 and TIVA  Airway Management Planned: Nasal Cannula, Natural Airway and Simple Face Mask  Additional Equipment:   Intra-op Plan:   Post-operative Plan:   Informed Consent: I have reviewed the patients History and Physical, chart, labs and discussed the procedure including the risks, benefits and alternatives for the proposed anesthesia with the patient or authorized representative who has indicated his/her understanding and acceptance.        Plan Discussed with: CRNA and Surgeon  Anesthesia Plan Comments: (Possible GA with LMA/ETT)        Anesthesia Quick Evaluation

## 2020-09-27 NOTE — Discharge Instructions (Signed)
[facility]  Discharge Instructions  AV Fistula or Graft Surgery for Dialysis Access  Please refer to the following instructions for your post-procedure care. Your surgeon or physician assistant will discuss any changes with you.  Activity  You may drive the day following your surgery, if you are comfortable and no longer taking prescription pain medication. Resume full activity as the soreness in your incision resolves.  Bathing/Showering  You may shower after you go home. Keep your incision dry for 48 hours. Do not soak in a bathtub, hot tub, or swim until the incision heals completely. You may not shower if you have a hemodialysis catheter.  Incision Care  Clean your incision with mild soap and water after 48 hours. Pat the area dry with a clean towel. You do not need a bandage unless otherwise instructed. Do not apply any ointments or creams to your incision. You may have skin glue on your incision. Do not peel it off. It will come off on its own in about one week. Your arm may swell a bit after surgery. To reduce swelling use pillows to elevate your arm so it is above your heart. Your doctor will tell you if you need to lightly wrap your arm with an ACE bandage.  Diet  Resume your normal diet. There are not special food restrictions following this procedure. In order to heal from your surgery, it is CRITICAL to get adequate nutrition. Your body requires vitamins, minerals, and protein. Vegetables are the best source of vitamins and minerals. Vegetables also provide the perfect balance of protein. Processed food has little nutritional value, so try to avoid this.  Medications  Resume taking all of your medications. If your incision is causing pain, you may take over-the counter pain relievers such as acetaminophen (Tylenol). If you were prescribed a stronger pain medication, please be aware these medications can cause nausea and constipation. Prevent  nausea by taking the medication with a snack or meal. Avoid constipation by drinking plenty of fluids and eating foods with high amount of fiber, such as fruits, vegetables, and grains.  Do not take Tylenol if you are taking prescription pain medications.  Follow up Your surgeon may want to see you in the office following your access surgery. If so, this will be arranged at the time of your surgery.  Please call us immediately for any of the following conditions:  Increased pain, redness, drainage (pus) from your incision site Fever of 101 degrees or higher Severe or worsening pain at your incision site Hand pain or numbness.  Reduce your risk of vascular disease:  Stop smoking. If you would like help, call [facility] at 1-800-QUIT-NOW 681-268-9290) or [place] at [place] your cholesterol Maintain a desired weight Control your diabetes Keep your blood pressure down  Dialysis  It will take several weeks to several months for your new dialysis access to be ready for use. Your surgeon will determine when it is okay to use it. Your nephrologist will continue to direct your dialysis. You can continue to use your Permcath until your new access is ready for use.   09/27/2020 Glenn Olson [id number] Oct 08, 1940  Surgeon(s): Franke Menter, Arvilla Meres, MD  Procedure(s): LEFT ARM ARTERIOVENOUS GRAFT PLACEMENT USING GORE-TEX 4-7MM X 45CM  VASCULAR GRAFT INSERTION OF PALINDROME TUNNELED DIALYSIS CATHETER 14.67f X 23CM   May stick graft immediately   May stick graft on designated area only:    Do not stick graft for 4  weeks    If you have any questions, please call the office at [phone].

## 2020-09-27 NOTE — Op Note (Signed)
OPERATIVE REPORT  DATE OF SURGERY: 09/27/2020  PATIENT: Glenn Olson, 80 y.o. male MRN: [id number]  DOB: 08/24/1940  PRE-OPERATIVE DIAGNOSIS: End-stage renal disease  POST-OPERATIVE DIAGNOSIS:  Same  PROCEDURE: #1 left forearm loop AV Gore-Tex graft, #2 right IJ tunneled hemodialysis catheter  SURGEON:  Curt Jews, M.D.  PHYSICIAN ASSISTANT: Fulton Mole, RNFA  The assistant was needed for exposure and to expedite the case  ANESTHESIA: Local with sedation  EBL: per anesthesia record  Total I/O In: 600 [I.V.:600] Out: -   BLOOD ADMINISTERED: none  DRAINS: none  SPECIMEN: none  COUNTS CORRECT:  YES  PATIENT DISPOSITION:  PACU - hemodynamically stable  PROCEDURE DETAILS: Patient was taken operating placed supine position with area of the left arm was prepped draped you sterile fashion.  SonoSite was used to visualize the veins showing very small cephalic and basilic vein.  Using local anesthesia incision made over the antecubital space and carried down to isolate the brachial artery which was of very good caliber in the brachial vein which was also of good caliber.  Decision was made to place a forearm loop graft.  Separate incision was made over the distal forearm and a loop configuration tunnel was created.  A 4 x 7 mm Gore-Tex graft was brought through the tunnel.  The arterial portion of the graft was on the radial aspect of the arm.  The radial artery was occluded proximally and distally and was opened with an 11 blade and extended longitudinally with Potts scissors.  The 4 mm portion of the graft was resected to approximately 5 mm and this was sewn end-to-side to the artery with a running 6-0 Prolene suture.  This anastomosis was tested and found to be adequate.  The graft was flushed with heparinized saline and reoccluded.  The brachial vein was occluded proximally and distally and was opened with 11 blade and sent longstanding Pott scissors.  A 7 mm portion of the  graft was spatulated after cutting to the appropriate length and was sewn end-to-side to the vein with a running 6-0 Prolene suture.  Clamps were removed and excellent thrill was noted.  The patient maintained a radial pulse.  The wounds were irrigated with saline.  Hemostasis obtained with cautery.  The wounds were closed with 3-0 Vicryl in the subcutaneous and subcuticular tissue.  Sterile dressing was applied.  Attention was then turned to the catheter.  The right neck was imaged with SonoSite ultrasound the patient did have a patent right internal jugular vein.  The right and left neck and chest were prepped and draped in usual sterile fashion.  Using SonoSite visualization and local anesthesia the right internal jugular vein was accessed with an 18-gauge needle and a guidewire was passed centrally.  A separate incision was made in the end of [place] infraclavicular region and 23 cm catheter was passed through the subcutaneous tunnel.  The dilator and peel-away sheath was passed over the guidewire and the dilator and guidewire removed.  The catheter was passed down the peel-away sheath and the peel-away sheath was removed.  The catheter tips were positioned the level of the distal right atrium.  The catheter was secured to the skin with a 3-0 nylon suture and the entry site was closed with 4 subcuticular Vicryl stitch.  Sterile dressing was applied and the patient was transferred to the recovery room where chest x-ray is pending   Glenn Olson, M.D., [facility] 09/27/2020 11:51 AM  Note: Portions of this report may  have been transcribed using voice recognition software.  Every effort has been made to ensure accuracy; however, inadvertent computerized transcription errors may still be present.

## 2020-09-27 NOTE — Interval H&P Note (Signed)
History and Physical Interval Note:  09/27/2020 8:49 AM  Glenn Olson  has presented today for surgery, with the diagnosis of End Stage Renal Disease.  The various methods of treatment have been discussed with the patient and family. After consideration of risks, benefits and other options for treatment, the patient has consented to  Procedure(s): LEFT ARM ARTERIOVENOUS GRAFT PLACEMENT VS AVF (Left) INSERTION OF TUNNELED DIALYSIS CATHETER (Left) as a surgical intervention.  The patient's history has been reviewed, patient examined, no change in status, stable for surgery.  I have reviewed the patient's chart and labs.  Questions were answered to the patient's satisfaction.     Curt Jews

## 2020-09-27 NOTE — Anesthesia Postprocedure Evaluation (Signed)
Anesthesia Post Note  Patient: Glenn Olson  Procedure(s) Performed: LEFT ARM ARTERIOVENOUS GRAFT PLACEMENT USING GORE-TEX 4-7MM X 45CM  VASCULAR GRAFT (Left: Arm Lower) INSERTION OF PALINDROME TUNNELED DIALYSIS CATHETER 14.48f X 23CM (Right: Chest)  Patient location during evaluation: PACU Anesthesia Type: General Level of consciousness: awake and alert and oriented Pain management: pain level controlled Vital Signs Assessment: post-procedure vital signs reviewed and stable Respiratory status: spontaneous breathing and respiratory function stable Cardiovascular status: blood pressure returned to baseline and stable Postop Assessment: no apparent nausea or vomiting Anesthetic complications: no   No notable events documented.   Last Vitals:  Vitals:   09/27/20 1230 09/27/20 1239  BP: 123/78 127/72  Pulse: 72 79  Resp: 16 16  Temp:  (!) 36.4 C  SpO2: 98% 98%    Last Pain:  Vitals:   09/27/20 1239  TempSrc: Oral  PainSc: 0-No pain                 Rochele Lueck C Caron Ode

## 2020-09-28 ENCOUNTER — Encounter (HOSPITAL_COMMUNITY): Payer: Self-pay | Admitting: Vascular Surgery

## 2020-10-26 ENCOUNTER — Ambulatory Visit (INDEPENDENT_AMBULATORY_CARE_PROVIDER_SITE_OTHER): Payer: Medicare Other | Admitting: Vascular Surgery

## 2020-10-26 ENCOUNTER — Other Ambulatory Visit: Payer: Self-pay

## 2020-10-26 ENCOUNTER — Encounter: Payer: Self-pay | Admitting: Vascular Surgery

## 2020-10-26 VITALS — BP 127/74 | HR 95 | Temp 97.3°F | Resp 16 | Ht 72.0 in | Wt 182.0 lb

## 2020-10-26 DIAGNOSIS — N186 End stage renal disease: Secondary | ICD-10-CM

## 2020-10-26 NOTE — Progress Notes (Signed)
   [facility] [place]  Patient name: Glenn Olson MRN: [id number] DOB: [birth year] Sex: male  REASON FOR VISIT: Follow-up left arm AV Gore-Tex graft placement  HPI: Glenn Olson is a 80 y.o. male here today for follow-up.  He had placement of a left forearm loop AV Gore-Tex graft and right IJ tunneled hemodialysis catheter on 09/27/2020.  Was felt to be an imminent need for dialysis.  He reports that he has actually not initiated dialysis yet.  He has an upcoming appointment with Dr. Justin Mend on 11/02/2020 for further discussion.  He has no steal symptoms.  He has had no difficulty with healing  Current Outpatient Medications  Medication Sig Dispense Refill   aspirin EC 81 MG tablet Take 81 mg by mouth daily.     omeprazole (PRILOSEC) 20 MG capsule Take 20 mg by mouth daily.     simvastatin (ZOCOR) 40 MG tablet Take 40 mg by mouth in the morning.     colchicine 0.6 MG tablet Take 0.6 mg by mouth daily as needed (gout/knee flare). (Patient not taking: Reported on 10/26/2020)     oxyCODONE-acetaminophen (PERCOCET) 5-325 MG tablet Take 1 tablet by mouth every 6 (six) hours as needed for severe pain. (Patient not taking: Reported on 10/26/2020) 8 tablet 0   No current facility-administered medications for this visit.     PHYSICAL EXAM: Vitals:   10/26/20 1040  BP: 127/74  Pulse: 95  Resp: 16  Temp: (!) 97.3 F (36.3 C)  TempSrc: Temporal  SpO2: 98%  Weight: 182 lb (82.6 kg)  Height: 6' (1.829 m)    GENERAL: The patient is a well-nourished male, in no acute distress. The vital signs are documented above. Right IJ catheter with no evidence of surrounding erythema. Left forearm loop graft well-healed with excellent thrill and bruit.  2+ left radial pulse  MEDICAL ISSUES: 1 month out from left forearm loop AV Gore-Tex graft placement.  He should be able to use his graft at any time.  I do not see any need to continue to keep his  right IJ catheter.  He will discuss this with Dr. Justin Mend at their upcoming visit next week.  If the catheter is no longer needed, we are available to remove this at [facility].  Would request that Dr. Justin Mend let us know his preference.   Rosetta Posner, MD FACS [facility] [phone]  Note: Portions of this report may have been transcribed using voice recognition software.  Every effort has been made to ensure accuracy; however, inadvertent computerized transcription errors may still be present.

## 2020-11-02 DIAGNOSIS — D631 Anemia in chronic kidney disease: Secondary | ICD-10-CM | POA: Diagnosis not present

## 2020-11-02 DIAGNOSIS — N2581 Secondary hyperparathyroidism of renal origin: Secondary | ICD-10-CM | POA: Diagnosis not present

## 2020-11-02 DIAGNOSIS — N189 Chronic kidney disease, unspecified: Secondary | ICD-10-CM | POA: Diagnosis not present

## 2020-11-02 DIAGNOSIS — N319 Neuromuscular dysfunction of bladder, unspecified: Secondary | ICD-10-CM | POA: Diagnosis not present

## 2020-11-02 DIAGNOSIS — N185 Chronic kidney disease, stage 5: Secondary | ICD-10-CM | POA: Diagnosis not present

## 2020-11-02 DIAGNOSIS — I120 Hypertensive chronic kidney disease with stage 5 chronic kidney disease or end stage renal disease: Secondary | ICD-10-CM | POA: Diagnosis not present

## 2020-11-08 DIAGNOSIS — N186 End stage renal disease: Secondary | ICD-10-CM | POA: Diagnosis not present

## 2020-11-08 DIAGNOSIS — N2581 Secondary hyperparathyroidism of renal origin: Secondary | ICD-10-CM | POA: Diagnosis not present

## 2020-11-08 DIAGNOSIS — D631 Anemia in chronic kidney disease: Secondary | ICD-10-CM | POA: Diagnosis not present

## 2020-11-08 DIAGNOSIS — Z23 Encounter for immunization: Secondary | ICD-10-CM | POA: Diagnosis not present

## 2020-11-08 DIAGNOSIS — Z992 Dependence on renal dialysis: Secondary | ICD-10-CM | POA: Diagnosis not present

## 2020-11-09 ENCOUNTER — Ambulatory Visit: Payer: Medicare Other | Admitting: Vascular Surgery

## 2020-11-10 ENCOUNTER — Other Ambulatory Visit: Payer: Self-pay

## 2020-11-10 DIAGNOSIS — N2581 Secondary hyperparathyroidism of renal origin: Secondary | ICD-10-CM | POA: Diagnosis not present

## 2020-11-10 DIAGNOSIS — N186 End stage renal disease: Secondary | ICD-10-CM | POA: Diagnosis not present

## 2020-11-10 DIAGNOSIS — Z23 Encounter for immunization: Secondary | ICD-10-CM | POA: Diagnosis not present

## 2020-11-10 DIAGNOSIS — I129 Hypertensive chronic kidney disease with stage 1 through stage 4 chronic kidney disease, or unspecified chronic kidney disease: Secondary | ICD-10-CM | POA: Diagnosis not present

## 2020-11-10 DIAGNOSIS — D631 Anemia in chronic kidney disease: Secondary | ICD-10-CM | POA: Diagnosis not present

## 2020-11-10 DIAGNOSIS — Z992 Dependence on renal dialysis: Secondary | ICD-10-CM | POA: Diagnosis not present

## 2020-11-12 DIAGNOSIS — Z23 Encounter for immunization: Secondary | ICD-10-CM | POA: Diagnosis not present

## 2020-11-12 DIAGNOSIS — N186 End stage renal disease: Secondary | ICD-10-CM | POA: Diagnosis not present

## 2020-11-12 DIAGNOSIS — N2581 Secondary hyperparathyroidism of renal origin: Secondary | ICD-10-CM | POA: Diagnosis not present

## 2020-11-12 DIAGNOSIS — Z992 Dependence on renal dialysis: Secondary | ICD-10-CM | POA: Diagnosis not present

## 2020-11-12 DIAGNOSIS — D631 Anemia in chronic kidney disease: Secondary | ICD-10-CM | POA: Diagnosis not present

## 2020-11-15 DIAGNOSIS — N186 End stage renal disease: Secondary | ICD-10-CM | POA: Diagnosis not present

## 2020-11-15 DIAGNOSIS — Z23 Encounter for immunization: Secondary | ICD-10-CM | POA: Diagnosis not present

## 2020-11-15 DIAGNOSIS — D631 Anemia in chronic kidney disease: Secondary | ICD-10-CM | POA: Diagnosis not present

## 2020-11-15 DIAGNOSIS — Z992 Dependence on renal dialysis: Secondary | ICD-10-CM | POA: Diagnosis not present

## 2020-11-15 DIAGNOSIS — N2581 Secondary hyperparathyroidism of renal origin: Secondary | ICD-10-CM | POA: Diagnosis not present

## 2020-11-17 DIAGNOSIS — D631 Anemia in chronic kidney disease: Secondary | ICD-10-CM | POA: Diagnosis not present

## 2020-11-17 DIAGNOSIS — Z992 Dependence on renal dialysis: Secondary | ICD-10-CM | POA: Diagnosis not present

## 2020-11-17 DIAGNOSIS — Z23 Encounter for immunization: Secondary | ICD-10-CM | POA: Diagnosis not present

## 2020-11-17 DIAGNOSIS — N186 End stage renal disease: Secondary | ICD-10-CM | POA: Diagnosis not present

## 2020-11-17 DIAGNOSIS — N2581 Secondary hyperparathyroidism of renal origin: Secondary | ICD-10-CM | POA: Diagnosis not present

## 2020-11-19 DIAGNOSIS — Z992 Dependence on renal dialysis: Secondary | ICD-10-CM | POA: Diagnosis not present

## 2020-11-19 DIAGNOSIS — N186 End stage renal disease: Secondary | ICD-10-CM | POA: Diagnosis not present

## 2020-11-19 DIAGNOSIS — N2581 Secondary hyperparathyroidism of renal origin: Secondary | ICD-10-CM | POA: Diagnosis not present

## 2020-11-19 DIAGNOSIS — Z23 Encounter for immunization: Secondary | ICD-10-CM | POA: Diagnosis not present

## 2020-11-19 DIAGNOSIS — D631 Anemia in chronic kidney disease: Secondary | ICD-10-CM | POA: Diagnosis not present

## 2020-11-22 ENCOUNTER — Encounter (HOSPITAL_COMMUNITY): Admission: RE | Payer: Self-pay | Source: Home / Self Care

## 2020-11-22 ENCOUNTER — Ambulatory Visit (HOSPITAL_COMMUNITY): Admission: RE | Admit: 2020-11-22 | Payer: Medicare Other | Source: Home / Self Care | Admitting: Vascular Surgery

## 2020-11-22 ENCOUNTER — Telehealth: Payer: Self-pay

## 2020-11-22 DIAGNOSIS — D631 Anemia in chronic kidney disease: Secondary | ICD-10-CM | POA: Diagnosis not present

## 2020-11-22 DIAGNOSIS — N2581 Secondary hyperparathyroidism of renal origin: Secondary | ICD-10-CM | POA: Diagnosis not present

## 2020-11-22 DIAGNOSIS — N186 End stage renal disease: Secondary | ICD-10-CM | POA: Diagnosis not present

## 2020-11-22 DIAGNOSIS — Z992 Dependence on renal dialysis: Secondary | ICD-10-CM | POA: Diagnosis not present

## 2020-11-22 DIAGNOSIS — Z23 Encounter for immunization: Secondary | ICD-10-CM | POA: Diagnosis not present

## 2020-11-22 SURGERY — REMOVAL, DIALYSIS CATHETER
Anesthesia: LOCAL

## 2020-11-22 MED ORDER — LIDOCAINE HCL (PF) 1 % IJ SOLN
INTRAMUSCULAR | Status: AC
  Filled 2020-11-22: qty 30

## 2020-11-22 NOTE — Telephone Encounter (Signed)
Patient missed scheduled TDC removal at [facility] today. He is scheduled for an office visit in [place] tomorrow which he does not need - we can just rescheduled removal. Have attempted multiple times to call both numbers on file - utr or leave VM - updated MD.

## 2020-11-23 ENCOUNTER — Other Ambulatory Visit: Payer: Self-pay

## 2020-11-23 ENCOUNTER — Encounter: Payer: Self-pay | Admitting: Vascular Surgery

## 2020-11-23 ENCOUNTER — Ambulatory Visit (INDEPENDENT_AMBULATORY_CARE_PROVIDER_SITE_OTHER): Payer: Medicare Other | Admitting: Vascular Surgery

## 2020-11-23 ENCOUNTER — Ambulatory Visit: Payer: Medicare Other | Admitting: Vascular Surgery

## 2020-11-23 VITALS — Ht 72.0 in | Wt 182.0 lb

## 2020-11-23 DIAGNOSIS — N186 End stage renal disease: Secondary | ICD-10-CM

## 2020-11-23 NOTE — H&P (View-Only) (Signed)
   [facility] [place]  Patient name: Glenn Olson MRN: [id number] DOB: December 18, 1940 Sex: male  REASON FOR VISIT: Discuss removal of tunneled hemodialysis catheter  HPI: Glenn Olson is a 80 y.o. male here for discussion of removal of catheter.  Current Outpatient Medications  Medication Sig Dispense Refill   aspirin EC 81 MG tablet Take 81 mg by mouth daily.     colchicine 0.6 MG tablet Take 0.6 mg by mouth daily as needed (gout/knee flare).     NIFEdipine (PROCARDIA XL/NIFEDICAL XL) 60 MG 24 hr tablet Take 60 mg by mouth daily.     omeprazole (PRILOSEC) 20 MG capsule Take 20 mg by mouth daily.     simvastatin (ZOCOR) 40 MG tablet Take 40 mg by mouth in the morning.     No current facility-administered medications for this visit.     PHYSICAL EXAM: Vitals:   11/23/20 1003  Weight: 182 lb (82.6 kg)  Height: 6' (1.829 m)    GENERAL: The patient is a well-nourished male, in no acute distress. The vital signs are documented above. Left forearm loop AV Gore-Tex graft with good thrill and well-healed.  Right IJ tunneled catheter intact  MEDICAL ISSUES: There is been some scheduling confusion with this patient.  He was to have his catheter removed at the hospital yesterday.  Apparently he did not understand this and presented today for a visit in my office.  I did explain that we would need to remove his catheter in the hospital and coordinate this for this coming Tuesday, my next day at the hospital.  This will be 11/29/2020 prior to his dialysis later in the day   Rosetta Posner, MD FACS [facility] [place] Office Tel [phone]  Note: Portions of this report may have been transcribed using voice recognition software.  Every effort has been made to ensure accuracy; however, inadvertent computerized transcription errors may still be present.

## 2020-11-23 NOTE — Progress Notes (Signed)
   [facility] of [place]  Patient name: Glenn Olson MRN: [id number] DOB: Oct 16, 1940 Sex: male  REASON FOR VISIT: Discuss removal of tunneled hemodialysis catheter  HPI: Glenn Olson is a 80 y.o. male here for discussion of removal of catheter.  Current Outpatient Medications  Medication Sig Dispense Refill   aspirin EC 81 MG tablet Take 81 mg by mouth daily.     colchicine 0.6 MG tablet Take 0.6 mg by mouth daily as needed (gout/knee flare).     NIFEdipine (PROCARDIA XL/NIFEDICAL XL) 60 MG 24 hr tablet Take 60 mg by mouth daily.     omeprazole (PRILOSEC) 20 MG capsule Take 20 mg by mouth daily.     simvastatin (ZOCOR) 40 MG tablet Take 40 mg by mouth in the morning.     No current facility-administered medications for this visit.     PHYSICAL EXAM: Vitals:   11/23/20 1003  Weight: 182 lb (82.6 kg)  Height: 6' (1.829 m)    GENERAL: The patient is a well-nourished male, in no acute distress. The vital signs are documented above. Left forearm loop AV Gore-Tex graft with good thrill and well-healed.  Right IJ tunneled catheter intact  MEDICAL ISSUES: There is been some scheduling confusion with this patient.  He was to have his catheter removed at the hospital yesterday.  Apparently he did not understand this and presented today for a visit in my office.  I did explain that we would need to remove his catheter in the hospital and coordinate this for this coming Tuesday, my next day at the hospital.  This will be 11/29/2020 prior to his dialysis later in the day   Rosetta Posner, MD FACS [facility] [place] Office Tel [phone]  Note: Portions of this report may have been transcribed using voice recognition software.  Every effort has been made to ensure accuracy; however, inadvertent computerized transcription errors may still be present.

## 2020-11-24 DIAGNOSIS — N186 End stage renal disease: Secondary | ICD-10-CM | POA: Diagnosis not present

## 2020-11-24 DIAGNOSIS — N2581 Secondary hyperparathyroidism of renal origin: Secondary | ICD-10-CM | POA: Diagnosis not present

## 2020-11-24 DIAGNOSIS — D631 Anemia in chronic kidney disease: Secondary | ICD-10-CM | POA: Diagnosis not present

## 2020-11-24 DIAGNOSIS — Z23 Encounter for immunization: Secondary | ICD-10-CM | POA: Diagnosis not present

## 2020-11-24 DIAGNOSIS — Z992 Dependence on renal dialysis: Secondary | ICD-10-CM | POA: Diagnosis not present

## 2020-11-26 DIAGNOSIS — N2581 Secondary hyperparathyroidism of renal origin: Secondary | ICD-10-CM | POA: Diagnosis not present

## 2020-11-26 DIAGNOSIS — Z992 Dependence on renal dialysis: Secondary | ICD-10-CM | POA: Diagnosis not present

## 2020-11-26 DIAGNOSIS — N186 End stage renal disease: Secondary | ICD-10-CM | POA: Diagnosis not present

## 2020-11-26 DIAGNOSIS — D631 Anemia in chronic kidney disease: Secondary | ICD-10-CM | POA: Diagnosis not present

## 2020-11-26 DIAGNOSIS — Z23 Encounter for immunization: Secondary | ICD-10-CM | POA: Diagnosis not present

## 2020-11-28 ENCOUNTER — Other Ambulatory Visit: Payer: Self-pay

## 2020-11-29 ENCOUNTER — Inpatient Hospital Stay (HOSPITAL_COMMUNITY)
Admission: EM | Admit: 2020-11-29 | Discharge: 2020-12-02 | DRG: 537 | Disposition: A | Discharge status: Home / Self Care | Payer: Medicare Other | Attending: Internal Medicine | Admitting: Internal Medicine

## 2020-11-29 ENCOUNTER — Emergency Department (HOSPITAL_COMMUNITY): Payer: Medicare Other

## 2020-11-29 ENCOUNTER — Encounter (HOSPITAL_COMMUNITY): Payer: Self-pay | Admitting: *Deleted

## 2020-11-29 ENCOUNTER — Ambulatory Visit (HOSPITAL_COMMUNITY): Admission: RE | Admit: 2020-11-29 | Payer: Medicare Other | Source: Home / Self Care | Admitting: Vascular Surgery

## 2020-11-29 ENCOUNTER — Encounter (HOSPITAL_COMMUNITY): Admission: RE | Payer: Self-pay | Source: Home / Self Care

## 2020-11-29 ENCOUNTER — Other Ambulatory Visit: Payer: Self-pay

## 2020-11-29 DIAGNOSIS — M109 Gout, unspecified: Secondary | ICD-10-CM | POA: Diagnosis present

## 2020-11-29 DIAGNOSIS — R338 Other retention of urine: Secondary | ICD-10-CM | POA: Diagnosis present

## 2020-11-29 DIAGNOSIS — S76211A Strain of adductor muscle, fascia and tendon of right thigh, initial encounter: Secondary | ICD-10-CM | POA: Diagnosis not present

## 2020-11-29 DIAGNOSIS — N2581 Secondary hyperparathyroidism of renal origin: Secondary | ICD-10-CM | POA: Diagnosis present

## 2020-11-29 DIAGNOSIS — M79604 Pain in right leg: Secondary | ICD-10-CM | POA: Diagnosis not present

## 2020-11-29 DIAGNOSIS — Z7982 Long term (current) use of aspirin: Secondary | ICD-10-CM

## 2020-11-29 DIAGNOSIS — N312 Flaccid neuropathic bladder, not elsewhere classified: Secondary | ICD-10-CM | POA: Diagnosis present

## 2020-11-29 DIAGNOSIS — W19XXXA Unspecified fall, initial encounter: Secondary | ICD-10-CM | POA: Diagnosis not present

## 2020-11-29 DIAGNOSIS — Z8249 Family history of ischemic heart disease and other diseases of the circulatory system: Secondary | ICD-10-CM

## 2020-11-29 DIAGNOSIS — D631 Anemia in chronic kidney disease: Secondary | ICD-10-CM | POA: Diagnosis present

## 2020-11-29 DIAGNOSIS — Z95828 Presence of other vascular implants and grafts: Secondary | ICD-10-CM

## 2020-11-29 DIAGNOSIS — K219 Gastro-esophageal reflux disease without esophagitis: Secondary | ICD-10-CM | POA: Diagnosis present

## 2020-11-29 DIAGNOSIS — I953 Hypotension of hemodialysis: Secondary | ICD-10-CM | POA: Diagnosis not present

## 2020-11-29 DIAGNOSIS — M79673 Pain in unspecified foot: Secondary | ICD-10-CM | POA: Diagnosis not present

## 2020-11-29 DIAGNOSIS — M170 Bilateral primary osteoarthritis of knee: Secondary | ICD-10-CM | POA: Diagnosis present

## 2020-11-29 DIAGNOSIS — I10 Essential (primary) hypertension: Secondary | ICD-10-CM | POA: Diagnosis not present

## 2020-11-29 DIAGNOSIS — Z992 Dependence on renal dialysis: Secondary | ICD-10-CM | POA: Diagnosis not present

## 2020-11-29 DIAGNOSIS — Z978 Presence of other specified devices: Secondary | ICD-10-CM | POA: Diagnosis not present

## 2020-11-29 DIAGNOSIS — Z20822 Contact with and (suspected) exposure to covid-19: Secondary | ICD-10-CM | POA: Diagnosis not present

## 2020-11-29 DIAGNOSIS — N186 End stage renal disease: Secondary | ICD-10-CM | POA: Diagnosis not present

## 2020-11-29 DIAGNOSIS — I120 Hypertensive chronic kidney disease with stage 5 chronic kidney disease or end stage renal disease: Secondary | ICD-10-CM | POA: Diagnosis present

## 2020-11-29 DIAGNOSIS — Z960 Presence of urogenital implants: Secondary | ICD-10-CM | POA: Diagnosis present

## 2020-11-29 DIAGNOSIS — R8281 Pyuria: Secondary | ICD-10-CM | POA: Diagnosis present

## 2020-11-29 DIAGNOSIS — M25461 Effusion, right knee: Secondary | ICD-10-CM | POA: Diagnosis present

## 2020-11-29 DIAGNOSIS — M25551 Pain in right hip: Secondary | ICD-10-CM | POA: Diagnosis not present

## 2020-11-29 DIAGNOSIS — R52 Pain, unspecified: Secondary | ICD-10-CM | POA: Diagnosis not present

## 2020-11-29 DIAGNOSIS — E785 Hyperlipidemia, unspecified: Secondary | ICD-10-CM | POA: Diagnosis present

## 2020-11-29 DIAGNOSIS — X58XXXA Exposure to other specified factors, initial encounter: Secondary | ICD-10-CM | POA: Diagnosis present

## 2020-11-29 DIAGNOSIS — M79605 Pain in left leg: Secondary | ICD-10-CM | POA: Diagnosis not present

## 2020-11-29 DIAGNOSIS — Z79899 Other long term (current) drug therapy: Secondary | ICD-10-CM

## 2020-11-29 DIAGNOSIS — N3080 Other cystitis without hematuria: Secondary | ICD-10-CM | POA: Diagnosis not present

## 2020-11-29 LAB — URINALYSIS, ROUTINE W REFLEX MICROSCOPIC
Bilirubin Urine: NEGATIVE
Glucose, UA: NEGATIVE mg/dL
Ketones, ur: NEGATIVE mg/dL
Nitrite: NEGATIVE
Protein, ur: 100 mg/dL — AB
Specific Gravity, Urine: 1.006 (ref 1.005–1.030)
WBC, UA: >50 WBC/hpf — ABNORMAL HIGH (ref 0–5)
pH: 8 (ref 5.0–8.0)

## 2020-11-29 LAB — CBC WITH DIFFERENTIAL/PLATELET
Abs Immature Granulocytes: 0.03 10*3/uL (ref 0.00–0.07)
Basophils Absolute: 0.1 10*3/uL (ref 0.0–0.1)
Basophils Relative: 1 %
Eosinophils Absolute: 0.2 10*3/uL (ref 0.0–0.5)
Eosinophils Relative: 4 %
HCT: 31.8 % — ABNORMAL LOW (ref 39.0–52.0)
Hemoglobin: 10.4 g/dL — ABNORMAL LOW (ref 13.0–17.0)
Immature Granulocytes: 1 %
Lymphocytes Relative: 19 %
Lymphs Abs: 1.2 10*3/uL (ref 0.7–4.0)
MCH: 32.2 pg (ref 26.0–34.0)
MCHC: 32.7 g/dL (ref 30.0–36.0)
MCV: 98.5 fL (ref 80.0–100.0)
Monocytes Absolute: 0.6 10*3/uL (ref 0.1–1.0)
Monocytes Relative: 9 %
Neutro Abs: 4.3 10*3/uL (ref 1.7–7.7)
Neutrophils Relative %: 66 %
Platelets: 238 10*3/uL (ref 150–400)
RBC: 3.23 MIL/uL — ABNORMAL LOW (ref 4.22–5.81)
RDW: 12.4 % (ref 11.5–15.5)
WBC: 6.4 10*3/uL (ref 4.0–10.5)
nRBC: 0 % (ref 0.0–0.2)

## 2020-11-29 LAB — BASIC METABOLIC PANEL
Anion gap: 11 (ref 5–15)
BUN: 31 mg/dL — ABNORMAL HIGH (ref 8–23)
CO2: 25 mmol/L (ref 22–32)
Calcium: 8.3 mg/dL — ABNORMAL LOW (ref 8.9–10.3)
Chloride: 101 mmol/L (ref 98–111)
Creatinine, Ser: 5.77 mg/dL — ABNORMAL HIGH (ref 0.61–1.24)
GFR, Estimated: 9 mL/min — ABNORMAL LOW (ref >60)
Glucose, Bld: 92 mg/dL (ref 70–99)
Potassium: 3.4 mmol/L — ABNORMAL LOW (ref 3.5–5.1)
Sodium: 137 mmol/L (ref 135–145)

## 2020-11-29 LAB — RESP PANEL BY RT-PCR (FLU A&B, COVID) ARPGX2
Influenza A by PCR: NEGATIVE
Influenza B by PCR: NEGATIVE
SARS Coronavirus 2 by RT PCR: NEGATIVE

## 2020-11-29 LAB — MAGNESIUM: Magnesium: 1.8 mg/dL (ref 1.7–2.4)

## 2020-11-29 SURGERY — REMOVAL, DIALYSIS CATHETER
Anesthesia: LOCAL

## 2020-11-29 MED ORDER — PANTOPRAZOLE SODIUM 40 MG PO TBEC
40.0000 mg | DELAYED_RELEASE_TABLET | Freq: Every day | ORAL | Status: DC
  Administered 2020-11-30 – 2020-12-02 (×3): 40 mg via ORAL
  Filled 2020-11-29 (×3): qty 1

## 2020-11-29 MED ORDER — POLYETHYLENE GLYCOL 3350 17 G PO PACK: 17.0000 g | PACK | Freq: Every day | ORAL | Status: DC | PRN

## 2020-11-29 MED ORDER — ACETAMINOPHEN 650 MG RE SUPP: 650.0000 mg | Freq: Four times a day (QID) | RECTAL | Status: DC | PRN

## 2020-11-29 MED ORDER — HEPARIN SODIUM (PORCINE) 5000 UNIT/ML IJ SOLN
5000.0000 [IU] | Freq: Three times a day (TID) | INTRAMUSCULAR | Status: DC
  Administered 2020-11-30 – 2020-12-02 (×6): 5000 [IU] via SUBCUTANEOUS
  Filled 2020-11-29 (×6): qty 1

## 2020-11-29 MED ORDER — LORAZEPAM 2 MG/ML IJ SOLN
0.5000 mg | Freq: Once | INTRAMUSCULAR | Status: DC
  Filled 2020-11-29: qty 1

## 2020-11-29 MED ORDER — NIFEDIPINE ER OSMOTIC RELEASE 30 MG PO TB24
60.0000 mg | ORAL_TABLET | Freq: Every day | ORAL | Status: DC
  Administered 2020-11-30 – 2020-12-02 (×3): 60 mg via ORAL
  Filled 2020-11-29 (×3): qty 2

## 2020-11-29 MED ORDER — SIMVASTATIN 20 MG PO TABS
40.0000 mg | ORAL_TABLET | Freq: Every day | ORAL | Status: DC
  Administered 2020-11-30 – 2020-12-02 (×3): 40 mg via ORAL
  Filled 2020-11-29 (×3): qty 2

## 2020-11-29 MED ORDER — ONDANSETRON HCL 4 MG/2ML IJ SOLN: 4.0000 mg | Freq: Four times a day (QID) | INTRAMUSCULAR | Status: DC | PRN

## 2020-11-29 MED ORDER — ACETAMINOPHEN 500 MG PO TABS
1000.0000 mg | ORAL_TABLET | Freq: Once | ORAL | Status: AC
  Administered 2020-11-29: 1000 mg via ORAL
  Filled 2020-11-29: qty 2

## 2020-11-29 MED ORDER — ONDANSETRON HCL 4 MG PO TABS: 4.0000 mg | ORAL_TABLET | Freq: Four times a day (QID) | ORAL | Status: DC | PRN

## 2020-11-29 MED ORDER — ACETAMINOPHEN 325 MG PO TABS: 650.0000 mg | ORAL_TABLET | Freq: Four times a day (QID) | ORAL | Status: DC | PRN

## 2020-11-29 MED ORDER — SODIUM CHLORIDE 0.9 % IV SOLN
1.0000 g | INTRAVENOUS | Status: DC
  Administered 2020-11-29 – 2020-11-30 (×2): 1 g via INTRAVENOUS
  Filled 2020-11-29 (×2): qty 10

## 2020-11-29 NOTE — H&P (Signed)
History and Physical    Glenn Olson XBM:[id number] DOB: 06-Oct-1940 DOA: 11/29/2020  PCP: Celene Squibb, MD   Patient coming from: Home  I have personally briefly reviewed patient's old medical records in [place]  Chief Complaint: Leg pain  HPI: Glenn Olson is a 80 y.o. male with medical history significant for atonic neurogenic bladder. Patient presented to the ED with complaints of right thigh pain that started this morning.  He could not bear weight on his right leg, so he was relying more on his left leg to assist with ambulation.  He reports his left leg got tired and was unable to ambulate.  He called his neighbor who could not help him and suggested he call 911.  He denies any fall or unusual activity or exercise.  He initially thought it was his gout, so he took colchicine but this did not help.  He denies back pain. Patient lives with his son and daughter-in-law, but they have to work. Patient has a chronic indwelling Foley catheter, which gets changed once a month.  He has not had any problems with his Foley.  No fevers no chills.  No vomiting no loose stools.  No melena.  He was started on dialysis about a month ago.  He still makes a little amount of urine.  ED Course: Temp 97.9.  Pulse 89-96.  Respiratory rate 11-30.  Blood pressure systolic 027O to 536U.  Sats greater than 94% on room air.  WBC 6.4.  UA with positive leukocytes greater than 50 WBCs.  X-ray of the hip is unremarkable.   - CT right hip-without fracture or acute skeletal abnormality, no evidence of avascular necrosis still necrosis.  Shows abnormal appearance of bladder including air projecting within the anterior wall findings supportive of emphysematous cystitis. Hospitalist to admit for inability to ambulate and possible urinary tract infection  Review of Systems: As per HPI all other systems reviewed and negative.  Past Medical History:  Diagnosis Date   Anemia    Chronic kidney disease     Dysrhythmia    GERD (gastroesophageal reflux disease)    Neurogenic bladder     Past Surgical History:  Procedure Laterality Date   AV FISTULA PLACEMENT Left 09/27/2020   Procedure: LEFT ARM ARTERIOVENOUS GRAFT PLACEMENT USING GORE-TEX 4-7MM X 45CM  VASCULAR GRAFT;  Surgeon: Rosetta Posner, MD;  Location: [facility];  Service: Vascular;  Laterality: Left;   CYSTOURETHROSCOPY     INSERTION OF DIALYSIS CATHETER Right 09/27/2020   Procedure: INSERTION OF PALINDROME TUNNELED DIALYSIS CATHETER 14.16f X 23CM;  Surgeon: Rosetta Posner, MD;  Location: [facility];  Service: Vascular;  Laterality: Right;   [place]       reports that he has never smoked. He has never used smokeless tobacco. He reports that he does not drink alcohol and does not use drugs.  No Known Allergies  Family history of hypertension.  Prior to Admission medications   Medication Sig Start Date End Date Taking? Authorizing Provider  aspirin EC 81 MG tablet Take 81 mg by mouth daily.   Yes [provider]  colchicine 0.6 MG tablet Take 0.6 mg by mouth daily as needed (gout/knee flare).   Yes [provider]  lidocaine-prilocaine (EMLA) cream Apply 1 application topically. Apply small amount to access sit (AVT) 1 to 2 hours before Dialysis. Cover with occlusive dressing 11/07/20  Yes [provider]  NIFEdipine (PROCARDIA XL/NIFEDICAL XL) 60 MG 24 hr tablet Take  60 mg by mouth daily. 11/05/20  Yes [provider]  omeprazole (PRILOSEC) 20 MG capsule Take 20 mg by mouth daily.   Yes [provider]  simvastatin (ZOCOR) 40 MG tablet Take 40 mg by mouth in the morning.   Yes [provider]    Physical Exam: Vitals:   11/29/20 1430 11/29/20 1445 11/29/20 1500 11/29/20 1709  BP: 138/73  137/78 (!) 143/85  Pulse: 90 89 90 96  Resp: (!) 26 13 (!) 23 16  Temp:      TempSrc:      SpO2: 92% 97% 94% 97%    Constitutional: NAD, calm, comfortable Vitals:   11/29/20  1430 11/29/20 1445 11/29/20 1500 11/29/20 1709  BP: 138/73  137/78 (!) 143/85  Pulse: 90 89 90 96  Resp: (!) 26 13 (!) 23 16  Temp:      TempSrc:      SpO2: 92% 97% 94% 97%   Eyes: PERRL, lids and conjunctivae normal ENMT: Mucous membranes are dry, Neck: normal, supple, no masses, no thyromegaly Respiratory: clear to auscultation bilaterally, no wheezing, no crackles. Normal respiratory effort. No accessory muscle use.  Cardiovascular: temporary HD access right upper chest, patient reports this was to be removed today.  Regular rate and rhythm, no murmurs / rubs / gallops. No extremity edema. 2+ pedal pulses.  HD access to left forearm bruit present. Abdomen: no tenderness, no masses palpated. No hepatosplenomegaly. Bowel sounds positive.  Musculoskeletal: At rest, patient has no pain, but on attempting to flex the hip and knee joint, patient experienced severe pain in the anterior medial aspect of his right thigh.,  About the middle third of his thigh.  He is able to move his hip joint and his knee without difficulty or pain.  Pain in his thighs limits range of motion of his hip and knee.  No clubbing / cyanosis. No joint deformity upper and lower extremities. Good ROM, no contractures. Normal muscle tone.  Skin: no rashes, lesions, ulcers. No induration Neurologic: No apparent cranial nerve abnormality, moving all extremities spontaneously. Psychiatric: Normal judgment and insight. Alert and oriented x 3. Normal mood.   Labs on Admission: I have personally reviewed following labs and imaging studies  CBC: Recent Labs  Lab 11/29/20 1721  WBC 6.4  NEUTROABS 4.3  HGB 10.4*  HCT 31.8*  MCV 98.5  PLT 782   Basic Metabolic Panel: Recent Labs  Lab 11/29/20 1721  NA 137  K 3.4*  CL 101  CO2 25  GLUCOSE 92  BUN 31*  CREATININE 5.77*  CALCIUM 8.3*   Urine analysis:    Component Value Date/Time   COLORURINE YELLOW 11/29/2020 1705   APPEARANCEUR CLOUDY (A) 11/29/2020 1705    LABSPEC 1.006 11/29/2020 1705   PHURINE 8.0 11/29/2020 1705   GLUCOSEU NEGATIVE 11/29/2020 1705   HGBUR SMALL (A) 11/29/2020 1705   BILIRUBINUR NEGATIVE 11/29/2020 [place] 11/29/2020 1705   PROTEINUR 100 (A) 11/29/2020 1705   NITRITE NEGATIVE 11/29/2020 1705   LEUKOCYTESUR LARGE (A) 11/29/2020 1705    Radiological Exams on Admission: CT Hip Right Wo Contrast  Result Date: 11/29/2020 CLINICAL DATA:  Right hip pain. Osteo necrosis suspected. Pain began this morning. EXAM: CT OF THE RIGHT HIP WITHOUT CONTRAST TECHNIQUE: Multidetector CT imaging of the right hip was performed according to the standard protocol. Multiplanar CT image reconstructions were also generated. COMPARISON:  Current right hip radiographs. FINDINGS: Bones/Joint/Cartilage No fracture. No bone lesion. There is no evidence  of right femoral head osteonecrosis/avascular necrosis. Right hip joint is normally spaced and aligned. No significant degenerative/arthropathic change. No joint effusion. Ligaments Suboptimally assessed by CT. Muscles and Tendons Unremarkable. Soft tissues Bladder wall is thickened. There is a right posterior bladder diverticulum. Small bubbles of air projects within the bladder wall anteriorly. Patient has a suprapubic catheter and an enlarged prostate, both of these findings incompletely imaged. IMPRESSION: 1. No fracture or acute skeletal abnormality. No evidence of avascular necrosis/osteonecrosis. 2. Normally spaced and aligned right hip joint. No significant degenerative/arthropathic change. 3. Abnormal appearance of bladder including air projecting within the anterior wall. 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 The air within wall potentially be from the indwelling suprapubic catheter. Enlarged prostate. Electronically Signed   By: Lajean Manes M.D.   On: 11/29/2020 18:22   DG Hip Unilat W or Wo Pelvis 2-3 Views Right  Result Date: 11/29/2020 CLINICAL DATA:   Acute right hip pain without known injury. EXAM: DG HIP (WITH OR WITHOUT PELVIS) 2-3V RIGHT COMPARISON:  None. FINDINGS: There is no evidence of hip fracture or dislocation. There is no evidence of arthropathy or other focal bone abnormality. IMPRESSION: Negative. Electronically Signed   By: Marijo Conception M.D.   On: 11/29/2020 15:45    EKG: Independently reviewed. None  Assessment/Plan Principal Problem:   Lower extremity pain, right Active Problems:   Atonic neurogenic bladder   ESRD on hemodialysis (HCC)   Chronic indwelling Foley catheter   Right lower extremity pain-pain involving right thigh present with movement.  Pelvic x-ray and CT without acute abnormality to explain pain.  Patient unable to ambulate. -MRI right thigh in a.m, rule out tendinitis, muscle or ligament rupture. -PT evaluation -Pain improvement with Tylenol will continue  Chronic indwelling Foley catheter-for atonic neurogenic bladder.  CT suggesting emphysematous cystitis.  He is afebrile without leukocytosis.  Leukocytes, greater than 50 WBC and few bacteria.  -Patient is likely colonized, but with presence of emphysematous cystitis, will treat with IV ceftriaxone 1 g daily -Follow-up urine culture  ESRD on HD schedule Tuesday Thursday Saturday.  Did not get HD today.  Started HD 1 month ago.  Still makes some urine.  No sign of volume overload.  Vitals stable.  Potassium 3.4. - Pls consult nephrology in the morning for HD. -Check magnesium  HTN-  - resume nifedipine.  DVT prophylaxis: Heparin Code Status: Full code Family Communication: None at bedside Disposition Plan: ~ 1-2 days. Pending results of MRI, urine cultures. Likely d/c to home, pending PT eval. Consults called: nephrology Admission status: Obs tele    Bethena Roys MD [facility]  11/29/2020, 7:19 PM

## 2020-11-29 NOTE — ED Notes (Signed)
Pt given new urinal

## 2020-11-29 NOTE — ED Provider Notes (Signed)
[facility] EMERGENCY DEPARTMENT Provider Note   CSN: [id number] Arrival date & time: 11/29/20  1202     History Chief Complaint  Patient presents with   Leg Pain    Glenn Olson is a 80 y.o. male with a past medical history of CKD on hemodialysis and gout presenting today with a complaint of right leg pain.  He reports that this morning he was unable to stand due to the pain in his right leg.  He called his neighbor to help him home decided that they needed to call an ambulance because he was unable to stand up.  Denies numbness or tingling.  Locates the majority of his pain to the anterior lower extremity.  No falls or other injuries.   Past Medical History:  Diagnosis Date   Anemia    Chronic kidney disease    Dysrhythmia    GERD (gastroesophageal reflux disease)    Neurogenic bladder     Patient Active Problem List   Diagnosis Date Noted   Atonic neurogenic bladder 01/20/2019    Past Surgical History:  Procedure Laterality Date   AV FISTULA PLACEMENT Left 09/27/2020   Procedure: LEFT ARM ARTERIOVENOUS GRAFT PLACEMENT USING GORE-TEX 4-7MM X 45CM  VASCULAR GRAFT;  Surgeon: Rosetta Posner, MD;  Location: [facility];  Service: Vascular;  Laterality: Left;   CYSTOURETHROSCOPY     INSERTION OF DIALYSIS CATHETER Right 09/27/2020   Procedure: INSERTION OF PALINDROME TUNNELED DIALYSIS CATHETER 14.5f X 23CM;  Surgeon: Rosetta Posner, MD;  Location: [facility];  Service: Vascular;  Laterality: Right;   SUPRAPUBIC CATHETER PLACEMENT         No family history on file.  Social History   Tobacco Use   Smoking status: Never   Smokeless tobacco: Never  Vaping Use   Vaping Use: Never used  Substance Use Topics   Alcohol use: No   Drug use: No    Home Medications Prior to Admission medications   Medication Sig Start Date End Date Taking? Authorizing Provider  aspirin EC 81 MG tablet Take 81 mg by mouth daily.    [provider]  colchicine 0.6 MG tablet Take 0.6 mg by  mouth daily as needed (gout/knee flare).    [provider]  NIFEdipine (PROCARDIA XL/NIFEDICAL XL) 60 MG 24 hr tablet Take 60 mg by mouth daily. 11/05/20   [provider]  omeprazole (PRILOSEC) 20 MG capsule Take 20 mg by mouth daily.    [provider]  simvastatin (ZOCOR) 40 MG tablet Take 40 mg by mouth in the morning.    [provider]    Allergies    Patient has no known allergies.  Review of Systems   Review of Systems  Constitutional:  Negative for chills and fever.  Gastrointestinal:  Negative for nausea and vomiting.  Genitourinary:  Negative for dysuria, hematuria and penile pain.  Musculoskeletal:  Positive for gait problem and myalgias.  Skin:  Negative for wound.  Neurological:  Positive for weakness. Negative for numbness.  All other systems reviewed and are negative.  Physical Exam Updated Vital Signs BP 138/73   Pulse 89   Temp 97.9 F (36.6 C) (Oral)   Resp 13   SpO2 97%   Physical Exam Vitals and nursing note reviewed.  Constitutional:      Appearance: Normal appearance.  HENT:     Head: Normocephalic and atraumatic.  Eyes:     General: No scleral icterus.    Conjunctiva/sclera: Conjunctivae normal.  Pulmonary:     Effort: Pulmonary effort is normal. No respiratory distress.  Musculoskeletal:        General: Tenderness present. No deformity or signs of injury.     Comments: With pain to his anterior lower extremity with movement.  This pain worsens with adduction, but present with any movement.  Hip flexion results in pain "all over."  Skin:    General: Skin is warm and dry.     Findings: No bruising or rash.  Neurological:     Mental Status: He is alert.  Psychiatric:        Mood and Affect: Mood normal.        Behavior: Behavior normal.    ED Results / Procedures / Treatments   Labs (all labs ordered are listed, but only abnormal results are displayed) Labs Reviewed - No data to  display  EKG None  Radiology CT Hip Right Wo Contrast  Result Date: 11/29/2020 CLINICAL DATA:  Right hip pain. Osteo necrosis suspected. Pain began this morning. EXAM: CT OF THE RIGHT HIP WITHOUT CONTRAST TECHNIQUE: Multidetector CT imaging of the right hip was performed according to the standard protocol. Multiplanar CT image reconstructions were also generated. COMPARISON:  Current right hip radiographs. FINDINGS: Bones/Joint/Cartilage No fracture. No bone lesion. There is no evidence of right femoral head osteonecrosis/avascular necrosis. Right hip joint is normally spaced and aligned. No significant degenerative/arthropathic change. No joint effusion. Ligaments Suboptimally assessed by CT. Muscles and Tendons Unremarkable. Soft tissues Bladder wall is thickened. There is a right posterior bladder diverticulum. Small bubbles of air projects within the bladder wall anteriorly. Patient has a suprapubic catheter and an enlarged prostate, both of these findings incompletely imaged. IMPRESSION: 1. No fracture or acute skeletal abnormality. No evidence of avascular necrosis/osteonecrosis. 2. Normally spaced and aligned right hip joint. No significant degenerative/arthropathic change. 3. Abnormal appearance of bladder including air projecting within the anterior wall. 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 The air within wall potentially be from the indwelling suprapubic catheter. Enlarged prostate. Electronically Signed   By: Lajean Manes M.D.   On: 11/29/2020 18:22   DG Hip Unilat W or Wo Pelvis 2-3 Views Right  Result Date: 11/29/2020 CLINICAL DATA:  Acute right hip pain without known injury. EXAM: DG HIP (WITH OR WITHOUT PELVIS) 2-3V RIGHT COMPARISON:  None. FINDINGS: There is no evidence of hip fracture or dislocation. There is no evidence of arthropathy or other focal bone abnormality. IMPRESSION: Negative. Electronically Signed   By: Marijo Conception M.D.   On: 11/29/2020  15:45    Procedures Procedures   Medications Ordered in ED Medications  acetaminophen (TYLENOL) tablet 1,000 mg (1,000 mg Oral Given 11/29/20 1708)    ED Course  I have reviewed the triage vital signs and the nursing notes.  Pertinent labs & imaging results that were available during my care of the patient were reviewed by me and considered in my medical decision making (see chart for details).    MDM Rules/Calculators/A&P Patient was evaluated by me.  He was in no acute distress and resting comfortably on the stretcher.  Leg pain appeared to be aggravated with any type of motion.  There were no wounds or indications for hip fracture.  X-ray was obtained which was negative.  CT hip was also obtained which is negative for abnormalities of the hip however did call for a potential emphysematous cystitis.  This correlates with the urinary tract infection noted in patient's urine.  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 He has not noticed any abnormalities in the urine and denies pain.  No fevers or chills.  When nursing staff tried to ambulate the patient he was unable to walk.  Patient was given pain medication and attempted to ambulate again with no success.  At this point patient will be admitted to the hospital for his cystitis as well as inability to ambulate.  Final Clinical Impression(s) / ED Diagnoses Final diagnoses:  Left leg pain  Emphysematous cystitis    Rx / DC Orders Admitted to hospitalist service with MD. Denton Brick.     Darliss Ridgel 11/29/20 1913    Fredia Sorrow, MD 11/30/20 1606

## 2020-11-29 NOTE — ED Triage Notes (Signed)
Right leg pain, states leg has given out on him

## 2020-11-30 ENCOUNTER — Encounter (HOSPITAL_COMMUNITY): Payer: Self-pay | Admitting: Internal Medicine

## 2020-11-30 ENCOUNTER — Observation Stay (HOSPITAL_COMMUNITY): Payer: Medicare Other

## 2020-11-30 DIAGNOSIS — N312 Flaccid neuropathic bladder, not elsewhere classified: Secondary | ICD-10-CM | POA: Diagnosis present

## 2020-11-30 DIAGNOSIS — M79604 Pain in right leg: Secondary | ICD-10-CM | POA: Diagnosis not present

## 2020-11-30 DIAGNOSIS — M25461 Effusion, right knee: Secondary | ICD-10-CM | POA: Diagnosis present

## 2020-11-30 DIAGNOSIS — Z20822 Contact with and (suspected) exposure to covid-19: Secondary | ICD-10-CM | POA: Diagnosis present

## 2020-11-30 DIAGNOSIS — D179 Benign lipomatous neoplasm, unspecified: Secondary | ICD-10-CM | POA: Diagnosis not present

## 2020-11-30 DIAGNOSIS — Z95828 Presence of other vascular implants and grafts: Secondary | ICD-10-CM | POA: Diagnosis not present

## 2020-11-30 DIAGNOSIS — Z79899 Other long term (current) drug therapy: Secondary | ICD-10-CM | POA: Diagnosis not present

## 2020-11-30 DIAGNOSIS — Z8249 Family history of ischemic heart disease and other diseases of the circulatory system: Secondary | ICD-10-CM | POA: Diagnosis not present

## 2020-11-30 DIAGNOSIS — E785 Hyperlipidemia, unspecified: Secondary | ICD-10-CM | POA: Diagnosis present

## 2020-11-30 DIAGNOSIS — Z992 Dependence on renal dialysis: Secondary | ICD-10-CM | POA: Diagnosis not present

## 2020-11-30 DIAGNOSIS — D631 Anemia in chronic kidney disease: Secondary | ICD-10-CM | POA: Diagnosis present

## 2020-11-30 DIAGNOSIS — I953 Hypotension of hemodialysis: Secondary | ICD-10-CM | POA: Diagnosis not present

## 2020-11-30 DIAGNOSIS — N186 End stage renal disease: Secondary | ICD-10-CM | POA: Diagnosis present

## 2020-11-30 DIAGNOSIS — R8281 Pyuria: Secondary | ICD-10-CM | POA: Diagnosis present

## 2020-11-30 DIAGNOSIS — M1711 Unilateral primary osteoarthritis, right knee: Secondary | ICD-10-CM | POA: Diagnosis not present

## 2020-11-30 DIAGNOSIS — I120 Hypertensive chronic kidney disease with stage 5 chronic kidney disease or end stage renal disease: Secondary | ICD-10-CM | POA: Diagnosis present

## 2020-11-30 DIAGNOSIS — M79651 Pain in right thigh: Secondary | ICD-10-CM | POA: Diagnosis not present

## 2020-11-30 DIAGNOSIS — X58XXXA Exposure to other specified factors, initial encounter: Secondary | ICD-10-CM | POA: Diagnosis present

## 2020-11-30 DIAGNOSIS — K219 Gastro-esophageal reflux disease without esophagitis: Secondary | ICD-10-CM | POA: Diagnosis present

## 2020-11-30 DIAGNOSIS — N390 Urinary tract infection, site not specified: Secondary | ICD-10-CM | POA: Diagnosis not present

## 2020-11-30 DIAGNOSIS — M109 Gout, unspecified: Secondary | ICD-10-CM | POA: Diagnosis present

## 2020-11-30 DIAGNOSIS — Z960 Presence of urogenital implants: Secondary | ICD-10-CM | POA: Diagnosis present

## 2020-11-30 DIAGNOSIS — R531 Weakness: Secondary | ICD-10-CM | POA: Diagnosis not present

## 2020-11-30 DIAGNOSIS — N2581 Secondary hyperparathyroidism of renal origin: Secondary | ICD-10-CM | POA: Diagnosis present

## 2020-11-30 DIAGNOSIS — R262 Difficulty in walking, not elsewhere classified: Secondary | ICD-10-CM | POA: Diagnosis not present

## 2020-11-30 DIAGNOSIS — R338 Other retention of urine: Secondary | ICD-10-CM | POA: Diagnosis present

## 2020-11-30 DIAGNOSIS — S76211A Strain of adductor muscle, fascia and tendon of right thigh, initial encounter: Secondary | ICD-10-CM | POA: Diagnosis present

## 2020-11-30 DIAGNOSIS — M170 Bilateral primary osteoarthritis of knee: Secondary | ICD-10-CM | POA: Diagnosis present

## 2020-11-30 DIAGNOSIS — Z7982 Long term (current) use of aspirin: Secondary | ICD-10-CM | POA: Diagnosis not present

## 2020-11-30 MED ORDER — CHLORHEXIDINE GLUCONATE CLOTH 2 % EX PADS
6.0000 | MEDICATED_PAD | Freq: Every day | CUTANEOUS | Status: DC
  Administered 2020-11-30 – 2020-12-02 (×2): 6 via TOPICAL

## 2020-11-30 NOTE — Progress Notes (Signed)
PROGRESS NOTE  Fields Oros NUU:[id number] DOB: 06/18/1940 DOA: 11/29/2020 PCP: Celene Squibb, MD  HPI/Recap of past 24 hours:  Glenn Olson is a 80 y.o. male with medical history significant for atonic neurogenic bladder. Patient presented to the ED with complaints of right thigh pain that started this morning.  He could not bear weight on his right leg, so he was relying more on his left leg to assist with ambulation.  He reports his left leg got tired and was unable to ambulate.  He called his neighbor who could not help him and suggested he call 911.  He denies any fall or unusual activity or exercise.  He initially thought it was his gout, so he took colchicine but this did not help.  He denies back pain. Patient lives with his son and daughter-in-law, but they have to work. Patient has a chronic indwelling Foley catheter, which gets changed once a month.  He has not had any problems with his Foley.  Work-up revealed UTI.  MRI ordered to rule out tendinitis, pending.  11/30/2020: Patient was seen and examined bedside.  Reports his right lower extremity pain is improving.  States he has been shuffling, avoiding to put too much pressure on his right lower extremity.  Assessment/Plan: Principal Problem:   Lower extremity pain, right Active Problems:   Atonic neurogenic bladder   ESRD on hemodialysis (HCC)   Chronic indwelling Foley catheter  Right lower extremity pain-pain involving right thigh present with movement.  Pelvic x-ray and CT without acute abnormality to explain pain.  Patient unable to ambulate. -MRI right thigh in a.m, rule out tendinitis, muscle or ligament rupture, follow results. -PT evaluation -Pain improvement with Tylenol will continue  Presumptive complicated UTI in the setting of chronic indwelling Foley catheter UA positive for pyuria Continue Rocephin empirically Follow-up urine culture for ID and sensitivities.   Chronic indwelling Foley catheter-for  atonic neurogenic bladder.  CT suggesting emphysematous cystitis.  He is afebrile without leukocytosis.  Leukocytes, greater than 50 WBC and few bacteria.  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  Did not get HD today.  Started HD 1 month ago.  Still makes some urine.  No sign of volume overload.  Vitals stable.  Potassium 3.4. Management per nephrology.   HTN-  Stable - resume nifedipine.   DVT prophylaxis: Heparin subcu 5000 units 3 times daily Code Status: Full code Family Communication: None at bedside Disposition Plan: ~ 1-2 days. Pending results of MRI, urine cultures. Likely d/c to home, pending PT eval. Consults called: nephrology Admission status: Inpatient Patient will require at least 2 midnights for further evaluation and treatment of present condition.     Objective: Vitals:   11/30/20 0546 11/30/20 0935 11/30/20 1306 11/30/20 1558  BP: (!) 146/81 134/70 137/79 (!) 149/77  Pulse: 82 93 90 84  Resp: 20 18 20 18   Temp: 98.2 F (36.8 C) 98.1 F (36.7 C) 97.9 F (36.6 C) 98.2 F (36.8 C)  TempSrc: Oral Oral Oral Oral  SpO2: 98% 98% 99% 97%  Weight:      Height:        Intake/Output Summary (Last 24 hours) at 11/30/2020 1811 Last data filed at 11/30/2020 1807 Gross per 24 hour  Intake 1056 ml  Output 1450 ml  Net -394 ml   Filed Weights   11/29/20 2100  Weight: 78.2 kg    Exam:  General: 80 y.o. year-old male well developed well nourished in no acute distress.  Alert and oriented x3. Cardiovascular: Regular rate and rhythm with no rubs or gallops.  No thyromegaly or JVD noted.   Respiratory: Clear to auscultation with no wheezes or rales. Good inspiratory effort. Abdomen: Soft nontender nondistended with normal bowel sounds x4 quadrants.  Indwelling Foley catheter in place. Musculoskeletal: No lower  extremity edema. 2/4 pulses in all 4 extremities. Skin: No ulcerative lesions noted or rashes, Psychiatry: Mood is appropriate for condition and setting   Data Reviewed: CBC: Recent Labs  Lab 11/29/20 1721  WBC 6.4  NEUTROABS 4.3  HGB 10.4*  HCT 31.8*  MCV 98.5  PLT 798   Basic Metabolic Panel: Recent Labs  Lab 11/29/20 1721 11/29/20 1723  NA 137  --   K 3.4*  --   CL 101  --   CO2 25  --   GLUCOSE 92  --   BUN 31*  --   CREATININE 5.77*  --   CALCIUM 8.3*  --   MG  --  1.8   GFR: Estimated Creatinine Clearance: 11.4 mL/min (A) (by C-G formula based on SCr of 5.77 mg/dL (H)). Liver Function Tests: No results for input(s): AST, ALT, ALKPHOS, BILITOT, PROT, ALBUMIN in the last 168 hours. No results for input(s): LIPASE, AMYLASE in the last 168 hours. No results for input(s): AMMONIA in the last 168 hours. Coagulation Profile: No results for input(s): INR, PROTIME in the last 168 hours. Cardiac Enzymes: No results for input(s): CKTOTAL, CKMB, CKMBINDEX, TROPONINI in the last 168 hours. BNP (last 3 results) No results for input(s): PROBNP in the last 8760 hours. HbA1C: No results for input(s): HGBA1C in the last 72 hours. CBG: No results for input(s): GLUCAP in the last 168 hours. Lipid Profile: No results for input(s): CHOL, HDL, LDLCALC, TRIG, CHOLHDL, LDLDIRECT in the last 72 hours. Thyroid Function Tests: No results for input(s): TSH, T4TOTAL, FREET4, T3FREE, THYROIDAB in the last 72 hours. Anemia Panel: No results for input(s): VITAMINB12, FOLATE, FERRITIN, TIBC, IRON, RETICCTPCT in the last 72 hours. Urine analysis:    Component Value Date/Time   COLORURINE YELLOW 11/29/2020 1705   APPEARANCEUR CLOUDY (A) 11/29/2020 1705   LABSPEC 1.006 11/29/2020 1705   PHURINE 8.0 11/29/2020 1705   GLUCOSEU NEGATIVE 11/29/2020 1705   HGBUR SMALL (A) 11/29/2020 1705   BILIRUBINUR NEGATIVE 11/29/2020 1705   KETONESUR NEGATIVE 11/29/2020 1705   PROTEINUR 100 (A)  11/29/2020 1705   NITRITE NEGATIVE 11/29/2020 1705   LEUKOCYTESUR LARGE (A) 11/29/2020 1705   Sepsis Labs: @LABRCNTIP (procalcitonin:4,lacticidven:4)  ) Recent Results (from the past 240 hour(s))  Resp Panel by RT-PCR (Flu A&B, Covid) Nasopharyngeal Swab     Status: None   Collection Time: 11/29/20  7:52 PM   Specimen: Nasopharyngeal Swab; Nasopharyngeal(NP) swabs in vial transport medium  Result Value Ref Range Status   SARS Coronavirus 2 by RT PCR NEGATIVE NEGATIVE Final    Comment: (NOTE) SARS-CoV-2 target nucleic acids are NOT DETECTED.  The SARS-CoV-2 RNA is generally detectable in upper respiratory specimens during the acute phase of infection. The lowest concentration of SARS-CoV-2 viral copies this assay can detect is 138 copies/mL. A negative result does not preclude SARS-Cov-2 infection and should not be used as the sole basis for treatment or other patient management decisions. A negative result may occur with  improper specimen collection/handling, submission of specimen other than nasopharyngeal swab, presence of viral  mutation(s) within the areas targeted by this assay, and inadequate number of viral copies(<138 copies/mL). A negative result must be combined with clinical observations, patient history, and epidemiological information. The expected result is Negative.  Fact Sheet for Patients:  EntrepreneurPulse.com.au  Fact Sheet for Healthcare Providers:  IncredibleEmployment.be  This test is no t yet approved or cleared by the [place] FDA and  has been authorized for detection and/or diagnosis of SARS-CoV-2 by FDA under an Emergency Use Authorization (EUA). This EUA will remain  in effect (meaning this test can be used) for the duration of the COVID-19 declaration under Section 564(b)(1) of the Act, 21 U.S.C.section 360bbb-3(b)(1), unless the authorization is terminated  or revoked sooner.       Influenza A by PCR  NEGATIVE NEGATIVE Final   Influenza B by PCR NEGATIVE NEGATIVE Final    Comment: (NOTE) The Xpert Xpress SARS-CoV-2/FLU/RSV plus assay is intended as an aid in the diagnosis of influenza from Nasopharyngeal swab specimens and should not be used as a sole basis for treatment. Nasal washings and aspirates are unacceptable for Xpert Xpress SARS-CoV-2/FLU/RSV testing.  Fact Sheet for Patients: EntrepreneurPulse.com.au  Fact Sheet for Healthcare Providers: IncredibleEmployment.be  This test is not yet approved or cleared by the [place] FDA and has been authorized for detection and/or diagnosis of SARS-CoV-2 by FDA under an Emergency Use Authorization (EUA). This EUA will remain in effect (meaning this test can be used) for the duration of the COVID-19 declaration under Section 564(b)(1) of the Act, 21 U.S.C. section 360bbb-3(b)(1), unless the authorization is terminated or revoked.  Performed at [facility], [address] [place], [place] [address]       Studies: MR [facility] RIGHT WO CONTRAST  Result Date: 11/30/2020 CLINICAL DATA:  Sudden right thigh pain.  Difficulty ambulating. EXAM: MRI OF THE RIGHT FEMUR WITHOUT CONTRAST TECHNIQUE: Multiplanar, multisequence MR imaging of the right femur was performed. No intravenous contrast was administered. COMPARISON:  CT scan 11/29/2020 FINDINGS: Both hips are normally located. Mild age related degenerative changes but no stress fracture or AVN. Both femurs are intact. No bone lesions or stress fracture. Moderate degenerative changes are noted at both knee joints. There are also changes of synovitis bilaterally but much more pronounced on the left side. These could be rice bodies or PVNS. 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 The quadriceps and hamstring tendons are intact. Mild  edema like signal changes noted in medial adductor muscles possibly reflecting a muscle strain or partial tear. The quadriceps and hamstring musculature are unremarkable. No acute tear or intramuscular hematoma. Small intramuscular lipoma is noted in the quadriceps femoris muscle. Small right inguinal hernia containing fat. No significant vascular abnormalities or adenopathy. IMPRESSION: 1. Moderate degenerative changes at both knee joints and bilateral joint effusions and synovitis. 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 2. Both hips are normally located. Mild degenerative changes but no stress fracture or AVN. 3. Mild edema like signal changes in the medial adductor muscles possibly reflecting a muscle strain or partial tear. 4. No significant intrapelvic abnormalities are identified. Electronically Signed   By: Marijo Sanes M.D.   On: 11/30/2020 12:16    Scheduled Meds:  Chlorhexidine Gluconate Cloth  6 each Topical Daily   heparin  5,000 Units Subcutaneous Q8H   LORazepam  0.5 mg Intravenous Once   NIFEdipine  60 mg Oral Daily  pantoprazole  40 mg Oral Daily   simvastatin  40 mg Oral Daily    Continuous Infusions:  cefTRIAXone (ROCEPHIN)  IV Stopped (11/29/20 2244)     LOS: 0 days     Kayleen Memos, MD [facility] Pager [phone]  If 7PM-7AM, please contact night-coverage [email or web address] Password [facility] 11/30/2020, 6:11 PM

## 2020-11-30 NOTE — Progress Notes (Signed)
Pt c/o diarrhea x 2 this shift, x1 at home prior to admit. Denies N/V, abd cramping, stool liquid consistency, odorous, but non-mucoid, will continue to monitor closely

## 2020-12-01 LAB — RENAL FUNCTION PANEL
Albumin: 3.3 g/dL — ABNORMAL LOW (ref 3.5–5.0)
Anion gap: 11 (ref 5–15)
BUN: 40 mg/dL — ABNORMAL HIGH (ref 8–23)
CO2: 23 mmol/L (ref 22–32)
Calcium: 8.4 mg/dL — ABNORMAL LOW (ref 8.9–10.3)
Chloride: 105 mmol/L (ref 98–111)
Creatinine, Ser: 6.37 mg/dL — ABNORMAL HIGH (ref 0.61–1.24)
GFR, Estimated: 8 mL/min — ABNORMAL LOW (ref >60)
Glucose, Bld: 137 mg/dL — ABNORMAL HIGH (ref 70–99)
Phosphorus: 3.9 mg/dL (ref 2.5–4.6)
Potassium: 3.1 mmol/L — ABNORMAL LOW (ref 3.5–5.1)
Sodium: 139 mmol/L (ref 135–145)

## 2020-12-01 LAB — CBC
HCT: 33.4 % — ABNORMAL LOW (ref 39.0–52.0)
Hemoglobin: 10.5 g/dL — ABNORMAL LOW (ref 13.0–17.0)
MCH: 31 pg (ref 26.0–34.0)
MCHC: 31.4 g/dL (ref 30.0–36.0)
MCV: 98.5 fL (ref 80.0–100.0)
Platelets: 228 10*3/uL (ref 150–400)
RBC: 3.39 MIL/uL — ABNORMAL LOW (ref 4.22–5.81)
RDW: 12.4 % (ref 11.5–15.5)
WBC: 5.5 10*3/uL (ref 4.0–10.5)
nRBC: 0 % (ref 0.0–0.2)

## 2020-12-01 LAB — URINE CULTURE

## 2020-12-01 LAB — HEPATITIS B SURFACE ANTIGEN: Hepatitis B Surface Ag: NONREACTIVE

## 2020-12-01 LAB — HEPATITIS B SURFACE ANTIBODY,QUALITATIVE: Hep B S Ab: NONREACTIVE

## 2020-12-01 MED ORDER — ALTEPLASE 2 MG IJ SOLR: 2.0000 mg | Freq: Once | INTRAMUSCULAR | Status: DC | PRN

## 2020-12-01 MED ORDER — LIDOCAINE-PRILOCAINE 2.5-2.5 % EX CREA: 1.0000 "application " | TOPICAL_CREAM | CUTANEOUS | Status: DC | PRN

## 2020-12-01 MED ORDER — LIDOCAINE HCL (PF) 1 % IJ SOLN: 5.0000 mL | INTRAMUSCULAR | Status: DC | PRN

## 2020-12-01 MED ORDER — SODIUM CHLORIDE 0.9 % IV SOLN: 100.0000 mL | INTRAVENOUS | Status: DC | PRN

## 2020-12-01 MED ORDER — HEPARIN SODIUM (PORCINE) 1000 UNIT/ML DIALYSIS: 1000.0000 [IU] | INTRAMUSCULAR | Status: DC | PRN

## 2020-12-01 MED ORDER — PENTAFLUOROPROP-TETRAFLUOROETH EX AERO: 1.0000 "application " | INHALATION_SPRAY | CUTANEOUS | Status: DC | PRN

## 2020-12-01 NOTE — Consult Note (Signed)
ESRD Consult Note  Assessment/Recommendations:   # ESRD: -Outpatient orders: [facility], TTS, 3.5 hours, F180, 400/800, EDW 80 kg, 2K, 2.5 Cal, AVG, heparin 2K bolus -HD on TTS schedule  #RLE pain -per primary  #Presumptive UTI, chronic indwelling foley -on rocephin, per primary  # Volume/ hypertension: EDW 80kg. Has been under EDW, UF as tolerated. New pre and post weight  # Anemia of Chronic Kidney Disease: Not on ESA's, monitor for now, transfuse for hgb <7  # Secondary Hyperparathyroidism/Hyperphosphatemia: Not on binders or VDRAs, check phos  # Vascular access: Has AVG that is being used. Needs TDC to be removed but patient as had issues coordinating this. Will see if it can be removed while he's here  # Additional recommendations: - Dose all meds for creatinine clearance < 10 ml/min  - Unless absolutely necessary, no MRIs with gadolinium.  - Implement save arm precautions.  Prefer needle sticks in the dorsum of the hands or wrists.  No blood pressure measurements in arm. - If blood transfusion is requested during hemodialysis sessions, please alert [place] prior to the session.  - If a hemodialysis catheter line culture is requested, please alert [place] as only hemodialysis nurses are able to collect those specimens.   Recommendations were discussed with the primary team.  Gean Quint, MD [facility]  History of Present Illness: Glenn Olson is a/an 80 y.o. male with a past medical history of ESRD, neurogenic bladder with chronic Foley who presents with right lower extremity pain.  Also has presumptive UTI given his chronic indwelling Foley catheter and that his CT is suggestive of an emphysematous cystitis, therefore started on Rocephin. Was supposed to have his tunneled dialysis catheter removed by Dr. Donnetta Hutching the patient has had some issues getting the scheduled.  Was due to get this removed on 11/1, however patient was in the ER. Patient currently does not have any  complaints. Hoping to go home soon. Denies any fevers, chills, chest pain, abd pain, issues with AVG. Patient reports that his RIJ [facility] was never used.  Medications:  Current Facility-Administered Medications  Medication Dose Route Frequency Provider Last Rate Last Admin   acetaminophen (TYLENOL) tablet 650 mg  650 mg Oral Q6H PRN Emokpae, Ejiroghene E, MD       Or   acetaminophen (TYLENOL) suppository 650 mg  650 mg Rectal Q6H PRN Emokpae, Ejiroghene E, MD       cefTRIAXone (ROCEPHIN) 1 g in sodium chloride 0.9 % 100 mL IVPB  1 g Intravenous Q24H Emokpae, Ejiroghene E, MD 200 mL/hr at 11/30/20 2043 1 g at 11/30/20 2043   Chlorhexidine Gluconate Cloth 2 % PADS 6 each  6 each Topical Daily Irene Pap N, DO   6 each at 11/30/20 1036   heparin injection 5,000 Units  5,000 Units Subcutaneous Q8H Emokpae, Ejiroghene E, MD   5,000 Units at 12/01/20 0549   LORazepam (ATIVAN) injection 0.5 mg  0.5 mg Intravenous Once Emokpae, Ejiroghene E, MD       NIFEdipine (PROCARDIA-XL/NIFEDICAL-XL) 24 hr tablet 60 mg  60 mg Oral Daily Emokpae, Ejiroghene E, MD   60 mg at 11/30/20 0944   ondansetron (ZOFRAN) tablet 4 mg  4 mg Oral Q6H PRN Emokpae, Ejiroghene E, MD       Or   ondansetron (ZOFRAN) injection 4 mg  4 mg Intravenous Q6H PRN Emokpae, Ejiroghene E, MD       pantoprazole (PROTONIX) EC tablet 40 mg  40 mg Oral Daily Emokpae, Ejiroghene E,  MD   40 mg at 11/30/20 0944   polyethylene glycol (MIRALAX / GLYCOLAX) packet 17 g  17 g Oral Daily PRN Emokpae, Ejiroghene E, MD       simvastatin (ZOCOR) tablet 40 mg  40 mg Oral Daily Emokpae, Ejiroghene E, MD   40 mg at 11/30/20 0944     ALLERGIES Patient has no known allergies.  MEDICAL HISTORY Past Medical History:  Diagnosis Date   Anemia    Chronic kidney disease    Dysrhythmia    GERD (gastroesophageal reflux disease)    Neurogenic bladder      SOCIAL HISTORY Social History   Socioeconomic History   Marital status: Divorced    Spouse name: Not on  file   Number of children: Not on file   Years of education: Not on file   Highest education level: Not on file  Occupational History   Not on file  Tobacco Use   Smoking status: Never   Smokeless tobacco: Never  Vaping Use   Vaping Use: Never used  Substance and Sexual Activity   Alcohol use: Yes    Comment: patient states he is a social drinker   Drug use: No   Sexual activity: Not on file  Other Topics Concern   Not on file  Social History Narrative   Not on file   Social Determinants of Health   Financial Resource Strain: Not on file  Food Insecurity: Not on file  Transportation Needs: Not on file  Physical Activity: Not on file  Stress: Not on file  Social Connections: Not on file  Intimate Partner Violence: Not on file     FAMILY HISTORY No family history on file.   Review of Systems: 12 systems were reviewed and negative except per HPI  Physical Exam: Vitals:   11/30/20 2120 12/01/20 0525  BP: (!) 141/72 130/77  Pulse: 88 84  Resp: 19 19  Temp: 98.1 F (36.7 C) 98 F (36.7 C)  SpO2: 96% 96%   No intake/output data recorded.  Intake/Output Summary (Last 24 hours) at 12/01/2020 0803 Last data filed at 12/01/2020 6314 Gross per 24 hour  Intake 1056 ml  Output 2075 ml  Net -1019 ml   General: well-appearing, no acute distress HEENT: anicteric sclera, MMM CV: normal rate, no murmurs, no edema Lungs: bilateral chest rise, normal wob Abd: soft, non-tender, non-distended Skin: no visible lesions or rashes Psych: alert, engaged, appropriate mood and affect Neuro: normal speech, no gross focal deficits  HD access: RIJ TDC c/d/I, LUE AVG +b/t  Test Results Reviewed Lab Results  Component Value Date   NA 137 11/29/2020   K 3.4 (L) 11/29/2020   CL 101 11/29/2020   CO2 25 11/29/2020   BUN 31 (H) 11/29/2020   CREATININE 5.77 (H) 11/29/2020   CALCIUM 8.3 (L) 11/29/2020    I have reviewed relevant outside healthcare records

## 2020-12-01 NOTE — Progress Notes (Addendum)
Patient off floor to dialysis

## 2020-12-01 NOTE — Progress Notes (Signed)
Pt. Back in room 329 from dialysis.

## 2020-12-01 NOTE — Progress Notes (Signed)
   Nephrology Nursing Note:   HBV serologies obtained from Care Everywhere:  Infectious Diseases Result Type Result Value Relevant Reference Range Interpretation Date  Hep B Surface Ab (anti-HBs) < 10 mIU/mL < 10 mIU/mL, Non- Immune  - November 08, 2020  Hep B Surface Ag (HBsAg) Negative Negative - November 08, 2020  Hep B core Ab Total (anti-HBc) Negative Negative - November 08, 2020     Pt is not immune / susceptible to Hep B virus.    Rockwell Alexandria, RN

## 2020-12-01 NOTE — Progress Notes (Addendum)
PROGRESS NOTE  Glenn Olson QIO:[id number] DOB: [birth year] DOA: 11/29/2020 PCP: Celene Squibb, MD  HPI/Recap of past 24 hours:  Glenn Olson is a 80 y.o. male with medical history significant for atonic neurogenic bladder, gout, essential hypertension, GERD, hyperlipidemia, chronic urinary retention with indwelling Foley catheter, who presented to [facility] ED with complaints of right thigh pain.  MRI right femur without contrast showed moderate degenerative changes of both knees and bilateral joint effusion and synovitis possible PVNS on the left.  Mild edema like signal changes in the medial abductor muscle possibly reflecting a muscle strain or partial tear.  12/01/2020: HD today.  He was seen at his bedside.  He states his right lower extremity pain is improving.  Assessment/Plan: Principal Problem:   Lower extremity pain, right Active Problems:   Atonic neurogenic bladder   ESRD on hemodialysis (HCC)   Chronic indwelling Foley catheter  Right lower extremity pain- Pain involving right thigh present with movement.  Pelvic x-ray and CT without acute abnormality to explain pain.  Patient unable to ambulate. -MRI right thigh showing results mentioned above. PT OT assessment pending -Pain improvement with Tylenol will continue  UTI ruled out Chronic indwelling Foley catheter UA positive for pyuria, urine culture multiple species present. Rocephin discontinued on 12/01/2020 Continue indwelling Foley catheter Will need to follow-up with his urologist outpatient.   ESRD on HD schedule Tuesday Thursday Saturday.   HD on 12/01/2020.  Rest of management per nephrology   HTN-  BP is at goal. Continue home nifedipine.   DVT prophylaxis: Heparin subcu 5000 units 3 times daily Code Status: Full code Family Communication: None at bedside Disposition Plan: Likely will discharge home on 12/02/2020 to home with home health services. Consults called: nephrology Admission status:  Inpatient Patient will require at least 2 midnights for further evaluation and treatment of present condition.     Objective: Vitals:   12/01/20 1230 12/01/20 1300 12/01/20 1315 12/01/20 1502  BP: 118/68 100/75 110/75 (!) 114/59  Pulse: 92 90 88 (!) 102  Resp:    16  Temp:    98.1 F (36.7 C)  TempSrc:    Oral  SpO2:    96%  Weight:      Height:        Intake/Output Summary (Last 24 hours) at 12/01/2020 1619 Last data filed at 12/01/2020 1547 Gross per 24 hour  Intake 340 ml  Output 2318 ml  Net -1978 ml   Filed Weights   11/29/20 2100 12/01/20 0940  Weight: 78.2 kg 77.8 kg    Exam:  General: 80 y.o. year-old male well-developed well-nourished in no acute distress.  He is alert oriented x3. Cardiovascular: Regular rate and rhythm no rubs or gallops.   Respiratory: Clear auscultation no wheezes or rales.  Abdomen: Soft nontender normal bowel sounds present.  Indwelling Foley catheter in place.  Musculoskeletal: No lower extremity edema bilaterally.   Skin: No ulcerative lesions noted. Psychiatry: Mood is appropriate for condition and setting.   Data Reviewed: CBC: Recent Labs  Lab 11/29/20 1721 12/01/20 0939  WBC 6.4 5.5  NEUTROABS 4.3  --   HGB 10.4* 10.5*  HCT 31.8* 33.4*  MCV 98.5 98.5  PLT 238 324   Basic Metabolic Panel: Recent Labs  Lab 11/29/20 1721 11/29/20 1723 12/01/20 0939  NA 137  --  139  K 3.4*  --  3.1*  CL 101  --  105  CO2 25  --  23  GLUCOSE 92  --  137*  BUN 31*  --  40*  CREATININE 5.77*  --  6.37*  CALCIUM 8.3*  --  8.4*  MG  --  1.8  --   PHOS  --   --  3.9   GFR: Estimated Creatinine Clearance: 10.3 mL/min (A) (by C-G formula based on SCr of 6.37 mg/dL (H)). Liver Function Tests: Recent Labs  Lab 12/01/20 0939  ALBUMIN 3.3*   No results for input(s): LIPASE, AMYLASE in the last 168 hours. No results for input(s): AMMONIA in the last 168 hours. Coagulation Profile: No results for input(s): INR, PROTIME in the last  168 hours. Cardiac Enzymes: No results for input(s): CKTOTAL, CKMB, CKMBINDEX, TROPONINI in the last 168 hours. BNP (last 3 results) No results for input(s): PROBNP in the last 8760 hours. HbA1C: No results for input(s): HGBA1C in the last 72 hours. CBG: No results for input(s): GLUCAP in the last 168 hours. Lipid Profile: No results for input(s): CHOL, HDL, LDLCALC, TRIG, CHOLHDL, LDLDIRECT in the last 72 hours. Thyroid Function Tests: No results for input(s): TSH, T4TOTAL, FREET4, T3FREE, THYROIDAB in the last 72 hours. Anemia Panel: No results for input(s): VITAMINB12, FOLATE, FERRITIN, TIBC, IRON, RETICCTPCT in the last 72 hours. Urine analysis:    Component Value Date/Time   COLORURINE YELLOW 11/29/2020 1705   APPEARANCEUR CLOUDY (A) 11/29/2020 1705   LABSPEC 1.006 11/29/2020 1705   PHURINE 8.0 11/29/2020 1705   GLUCOSEU NEGATIVE 11/29/2020 1705   HGBUR SMALL (A) 11/29/2020 1705   BILIRUBINUR NEGATIVE 11/29/2020 [place] 11/29/2020 1705   PROTEINUR 100 (A) 11/29/2020 1705   NITRITE NEGATIVE 11/29/2020 1705   LEUKOCYTESUR LARGE (A) 11/29/2020 1705   Sepsis Labs: @LABRCNTIP (procalcitonin:4,lacticidven:4)  ) Recent Results (from the past 240 hour(s))  Urine Culture     Status: Abnormal   Collection Time: 11/29/20  5:05 PM   Specimen: Urine, Clean Catch  Result Value Ref Range Status   Specimen Description   Final    URINE, CLEAN CATCH Performed at [facility], [address] [place], [place] [address]    Special Requests   Final    NONE Performed at [facility], [address], [place], [place] [address]    Culture MULTIPLE SPECIES PRESENT, SUGGEST RECOLLECTION (A)  Final   Report Status 12/01/2020 FINAL  Final  Resp Panel by RT-PCR (Flu A&B, Covid) Nasopharyngeal Swab     Status: None   Collection Time: 11/29/20  7:52 PM   Specimen: Nasopharyngeal Swab; Nasopharyngeal(NP) swabs in vial transport medium  Result Value Ref Range Status   SARS  Coronavirus 2 by RT PCR NEGATIVE NEGATIVE Final    Comment: (NOTE) SARS-CoV-2 target nucleic acids are NOT DETECTED.  The SARS-CoV-2 RNA is generally detectable in upper respiratory specimens during the acute phase of infection. The lowest concentration of SARS-CoV-2 viral copies this assay can detect is 138 copies/mL. A negative result does not preclude SARS-Cov-2 infection and should not be used as the sole basis for treatment or other patient management decisions. A negative result may occur with  improper specimen collection/handling, submission of specimen other than nasopharyngeal swab, presence of viral mutation(s) within the areas targeted by this assay, and inadequate number of viral copies(<138 copies/mL). A negative result must be combined with clinical observations, patient history, and epidemiological information. The expected result is Negative.  Fact Sheet for Patients:  EntrepreneurPulse.com.au  Fact Sheet for Healthcare Providers:  IncredibleEmployment.be  This test is no t yet approved or cleared by the [place]  States FDA and  has been authorized for detection and/or diagnosis of SARS-CoV-2 by FDA under an Emergency Use Authorization (EUA). This EUA will remain  in effect (meaning this test can be used) for the duration of the COVID-19 declaration under Section 564(b)(1) of the Act, 21 U.S.C.section 360bbb-3(b)(1), unless the authorization is terminated  or revoked sooner.       Influenza A by PCR NEGATIVE NEGATIVE Final   Influenza B by PCR NEGATIVE NEGATIVE Final    Comment: (NOTE) The Xpert Xpress SARS-CoV-2/FLU/RSV plus assay is intended as an aid in the diagnosis of influenza from Nasopharyngeal swab specimens and should not be used as a sole basis for treatment. Nasal washings and aspirates are unacceptable for Xpert Xpress SARS-CoV-2/FLU/RSV testing.  Fact Sheet for  Patients: EntrepreneurPulse.com.au  Fact Sheet for Healthcare Providers: IncredibleEmployment.be  This test is not yet approved or cleared by the [place] FDA and has been authorized for detection and/or diagnosis of SARS-CoV-2 by FDA under an Emergency Use Authorization (EUA). This EUA will remain in effect (meaning this test can be used) for the duration of the COVID-19 declaration under Section 564(b)(1) of the Act, 21 U.S.C. section 360bbb-3(b)(1), unless the authorization is terminated or revoked.  Performed at [facility], [address], [place], [place] [address]       Studies: No results found.  Scheduled Meds:  Chlorhexidine Gluconate Cloth  6 each Topical Daily   heparin  5,000 Units Subcutaneous Q8H   LORazepam  0.5 mg Intravenous Once   NIFEdipine  60 mg Oral Daily   pantoprazole  40 mg Oral Daily   simvastatin  40 mg Oral Daily    Continuous Infusions:  sodium chloride     sodium chloride       LOS: 1 day     Kayleen Memos, MD [facility] Pager [phone]  If 7PM-7AM, please contact night-coverage [email or web address] Password [facility] 12/01/2020, 4:19 PM

## 2020-12-02 ENCOUNTER — Other Ambulatory Visit: Payer: Self-pay

## 2020-12-02 LAB — CBC
HCT: 33.6 % — ABNORMAL LOW (ref 39.0–52.0)
Hemoglobin: 10.7 g/dL — ABNORMAL LOW (ref 13.0–17.0)
MCH: 31.3 pg (ref 26.0–34.0)
MCHC: 31.8 g/dL (ref 30.0–36.0)
MCV: 98.2 fL (ref 80.0–100.0)
Platelets: 226 10*3/uL (ref 150–400)
RBC: 3.42 MIL/uL — ABNORMAL LOW (ref 4.22–5.81)
RDW: 12.5 % (ref 11.5–15.5)
WBC: 6.7 10*3/uL (ref 4.0–10.5)
nRBC: 0 % (ref 0.0–0.2)

## 2020-12-02 LAB — RENAL FUNCTION PANEL
Albumin: 3.2 g/dL — ABNORMAL LOW (ref 3.5–5.0)
Anion gap: 9 (ref 5–15)
BUN: 18 mg/dL (ref 8–23)
CO2: 29 mmol/L (ref 22–32)
Calcium: 8.1 mg/dL — ABNORMAL LOW (ref 8.9–10.3)
Chloride: 98 mmol/L (ref 98–111)
Creatinine, Ser: 4.2 mg/dL — ABNORMAL HIGH (ref 0.61–1.24)
GFR, Estimated: 14 mL/min — ABNORMAL LOW (ref >60)
Glucose, Bld: 109 mg/dL — ABNORMAL HIGH (ref 70–99)
Phosphorus: 3.1 mg/dL (ref 2.5–4.6)
Potassium: 2.8 mmol/L — ABNORMAL LOW (ref 3.5–5.1)
Sodium: 136 mmol/L (ref 135–145)

## 2020-12-02 LAB — HEPATITIS B SURFACE ANTIBODY, QUANTITATIVE: Hep B S AB Quant (Post): <3.1 m[IU]/mL — ABNORMAL LOW (ref >9.9)

## 2020-12-02 MED ORDER — POTASSIUM CHLORIDE CRYS ER 20 MEQ PO TBCR
40.0000 meq | EXTENDED_RELEASE_TABLET | Freq: Once | ORAL | Status: AC
  Administered 2020-12-02: 40 meq via ORAL
  Filled 2020-12-02: qty 2

## 2020-12-02 NOTE — Care Management Important Message (Signed)
Important Message  Patient Details  Name: Glenn Olson MRN: [id number] Date of Birth: Sep 14, 1940   Medicare Important Message Given:  Yes     Tommy Medal 12/02/2020, 10:44 AM

## 2020-12-02 NOTE — Evaluation (Signed)
Occupational Therapy Evaluation Patient Details Name: Glenn Olson MRN: [id number] DOB: Jul 13, 1940 Today's Date: 12/02/2020   History of Present Illness Glenn Olson is a 80 y.o. male with medical history significant for atonic neurogenic bladder.  Patient presented to the ED with complaints of right thigh pain that started this morning.  He could not bear weight on his right leg, so he was relying more on his left leg to assist with ambulation.  He reports his left leg got tired and was unable to ambulate.  He called his neighbor who could not help him and suggested he call 911.  He denies any fall or unusual activity or exercise.  He initially thought it was his gout, so he took colchicine but this did not help.  He denies back pain.  Patient lives with his son and daughter-in-law, but they have to work.  Patient has a chronic indwelling Foley catheter, which gets changed once a month.  He has not had any problems with his Foley.  No fevers no chills.  No vomiting no loose stools.  No melena.  He was started on dialysis about a month ago.  He still makes a little amount of urine.   Clinical Impression   Pt agreeable to OT/PT co-evaluation. Pt demonstrates independent bed mobility with good sitting balance. Pt demonstrates mild limping with reports of R knee pain being primary limiting factor. Pt able to doff and don socks seated at EOB without difficulty and demonstrates WFL B UE strength and functional use. Pt is not recommended for acute OT services and will be discharged to care of nursing staff for remaining length of stay.       Recommendations for follow up therapy are one component of a multi-disciplinary discharge planning process, led by the attending physician.  Recommendations may be updated based on patient status, additional functional criteria and insurance authorization.   Follow Up Recommendations  No OT follow up    Assistance Recommended at Discharge None  Functional Status  Assessment  Patient has had a recent decline in their functional status and demonstrates the ability to make significant improvements in function in a reasonable and predictable amount of time.  Equipment Recommendations  None recommended by OT    Recommendations for Other Services       Precautions / Restrictions Precautions Precautions: Fall Restrictions Weight Bearing Restrictions: No      Mobility Bed Mobility Overal bed mobility: Independent                  Transfers Overall transfer level: Independent                 General transfer comment: Mild limping due to R knee pain.      Balance Overall balance assessment: Mild deficits observed, not formally tested (Mild limping due to R knee pain during funtional ambulation.)                                         ADL either performed or assessed with clinical judgement   ADL Overall ADL's : Independent                                       General ADL Comments: Pt able to doff and don socks without difficulty. Pt able to functionally  ambulate in hall and return to bed with limping but fair balance.     Vision Baseline Vision/History: 1 Wears glasses Ability to See in Adequate Light: 0 Adequate Patient Visual Report: No change from baseline Vision Assessment?: No apparent visual deficits                Pertinent Vitals/Pain Pain Assessment: Faces Faces Pain Scale: Hurts a little bit Pain Location: R knee Pain Descriptors / Indicators: Tender Pain Intervention(s): Limited activity within patient's tolerance;Monitored during session     Hand Dominance Right   Extremity/Trunk Assessment Upper Extremity Assessment Upper Extremity Assessment: Overall WFL for tasks assessed   Lower Extremity Assessment Lower Extremity Assessment: Defer to PT evaluation   Cervical / Trunk Assessment Cervical / Trunk Assessment: Normal   Communication  Communication Communication: No difficulties   Cognition Arousal/Alertness: Awake/alert Behavior During Therapy: WFL for tasks assessed/performed Overall Cognitive Status: Within Functional Limits for tasks assessed                                                        Home Living Family/patient expects to be discharged to:: Private residence Living Arrangements: Children Available Help at Discharge: Family;Available PRN/intermittently Type of Home: House Home Access: Level entry     Home Layout: One level     Bathroom Shower/Tub: Teacher, early years/pre: Handicapped height Bathroom Accessibility: Yes   Home Equipment: Conservation officer, nature (2 wheels)          Prior Functioning/Environment Prior Level of Function : Independent/Modified Independent             Mobility Comments: Ambulate without AD ADLs Comments: independent ADL; family completes cooking and grocery shopping.                      OT Goals(Current goals can be found in the care plan section) Acute Rehab OT Goals Patient Stated Goal: return home                   Co-evaluation PT/OT/SLP Co-Evaluation/Treatment: Yes Reason for Co-Treatment: To address functional/ADL transfers   OT goals addressed during session: ADL's and self-care                       End of Session    Activity Tolerance: Patient tolerated treatment well Patient left: in bed;with call bell/phone within reach  OT Visit Diagnosis: Unsteadiness on feet (R26.81);Other abnormalities of gait and mobility (R26.89)                Time: 7121-9758 OT Time Calculation (min): 10 min Charges:  OT General Charges $OT Visit: 1 Visit OT Evaluation $OT Eval Low Complexity: 1 Low  Aslan Himes OT, MOT  Larey Seat 12/02/2020, 9:15 AM

## 2020-12-02 NOTE — Evaluation (Addendum)
Physical Therapy Evaluation Patient Details Name: Glenn Olson MRN: [id number] DOB: 1940-04-10 Today's Date: 12/02/2020  History of Present Illness  Glenn Olson is a 80 y.o. male with medical history significant for atonic neurogenic bladder.  Patient presented to the ED with complaints of right thigh pain that started this morning.  He could not bear weight on his right leg, so he was relying more on his left leg to assist with ambulation.  He reports his left leg got tired and was unable to ambulate.  He called his neighbor who could not help him and suggested he call 911.  He denies any fall or unusual activity or exercise.  He initially thought it was his gout, so he took colchicine but this did not help.  He denies back pain.  Patient lives with his son and daughter-in-law, but they have to work.  Patient has a chronic indwelling Foley catheter, which gets changed once a month.  He has not had any problems with his Foley.  No fevers no chills.  No vomiting no loose stools.  No melena.  He was started on dialysis about a month ago.  He still makes a little amount of urine.   Clinical Impression  Patient functioning at baseline for functional mobility and gait other than demonstrating decreased cadence and unsteadiness, no loss of balance w/out AD d/t R knee pain. Patient ambulated in hallway using RW, demonstrated improve cadence and steadiness, no c/o fatigue. Recommend the use of RW to improve stability until R knee pain subsides. Patient discharged to care of nursing for ambulation daily as tolerated for length of stay.       Recommendations for follow up therapy are one component of a multi-disciplinary discharge planning process, led by the attending physician.  Recommendations may be updated based on patient status, additional functional criteria and insurance authorization.  Follow Up Recommendations No PT follow up    Assistance Recommended at Discharge None  Functional Status  Assessment    Equipment Recommendations  Rolling walker (2 wheels)    Recommendations for Other Services       Precautions / Restrictions Precautions Precautions: Fall Restrictions Weight Bearing Restrictions: No      Mobility  Bed Mobility Overal bed mobility: Independent                  Transfers Overall transfer level: Independent                 General transfer comment: Mild limping due to R knee pain.    Ambulation/Gait Ambulation/Gait assistance: Modified independent (Device/Increase time) Gait Distance (Feet): 120 Feet Assistive device: Rolling walker (2 wheels) Gait Pattern/deviations: Antalgic Gait velocity: decreased   General Gait Details: slow cadence w/ antalgic gait due to R knee pain, slightly unsteady w/out use of AD. Pt demonstrated increased steadiness and cadence w/ use of RW in hallway.  Stairs            Wheelchair Mobility    Modified Rankin (Stroke Patients Only)       Balance Overall balance assessment: Modified Independent;Mild deficits observed, not formally tested                                           Pertinent Vitals/Pain Pain Assessment: Faces Faces Pain Scale: Hurts a little bit Pain Location: R knee Pain Descriptors / Indicators: Tender Pain Intervention(s): Limited  activity within patient's tolerance;Monitored during session    [place] expects to be discharged to:: Private residence Living Arrangements: Children Available Help at Discharge: Family;Available PRN/intermittently Type of Home: House Home Access: Level entry       Home Layout: One level Home Equipment: Conservation officer, nature (2 wheels)      Prior Function Prior Level of Function : Independent/Modified Independent             Mobility Comments: Independent community ambulator, drives ADLs Comments: Indepentent w/ ADL's     Hand Dominance   Dominant Hand: Right    Extremity/Trunk  Assessment   Upper Extremity Assessment Upper Extremity Assessment: Defer to OT evaluation    Lower Extremity Assessment Lower Extremity Assessment: Overall WFL for tasks assessed    Cervical / Trunk Assessment Cervical / Trunk Assessment: Normal  Communication   Communication: No difficulties  Cognition Arousal/Alertness: Awake/alert Behavior During Therapy: WFL for tasks assessed/performed Overall Cognitive Status: Within Functional Limits for tasks assessed                                          General Comments      Exercises     Assessment/Plan    PT Assessment Patient does not need any further PT services  PT Problem List         PT Treatment Interventions      PT Goals (Current goals can be found in the Care Plan section)  Acute Rehab PT Goals Patient Stated Goal: Return home PT Goal Formulation: With patient Time For Goal Achievement: 12/02/20 Potential to Achieve Goals: Good    Frequency     Barriers to discharge        Co-evaluation PT/OT/SLP Co-Evaluation/Treatment: Yes Reason for Co-Treatment: To address functional/ADL transfers PT goals addressed during session: Mobility/safety with mobility OT goals addressed during session: ADL's and self-care       AM-PAC PT "6 Clicks" Mobility  Outcome Measure Help needed turning from your back to your side while in a flat bed without using bedrails?: None Help needed moving from lying on your back to sitting on the side of a flat bed without using bedrails?: None Help needed moving to and from a bed to a chair (including a wheelchair)?: None Help needed standing up from a chair using your arms (e.g., wheelchair or bedside chair)?: None Help needed to walk in hospital room?: A Little Help needed climbing 3-5 steps with a railing? : A Little 6 Click Score: 22    End of Session Equipment Utilized During Treatment: Gait belt Activity Tolerance: Patient tolerated treatment  well Patient left: in bed;with call bell/phone within reach Nurse Communication: Mobility status PT Visit Diagnosis: Unsteadiness on feet (R26.81);Other abnormalities of gait and mobility (R26.89);Muscle weakness (generalized) (M62.81)    Time: 0388-8280 PT Time Calculation (min) (ACUTE ONLY): 11 min   Charges:   PT Evaluation $PT Eval Low Complexity: 1 Low PT Treatments $Gait Training: 8-22 mins        Cassie Jones, SPT  During this treatment session, the therapist was present, participating in and directing the treatment.  11:18 AM, 12/02/20 Lonell Grandchild, MPT Physical Therapist with [facility] [phone] office [phone] mobile phone

## 2020-12-02 NOTE — Progress Notes (Addendum)
[facility] Progress Note    Assessment/ Plan:   ESRD: -Outpatient orders: [facility], TTS, 3.5 hours, F180, 400/800, EDW 80 kg, 2K, 2.5 Cal, AVG, heparin 2K bolus -HD on TTS schedule   #RLE pain -per primary   #Presumptive UTI, chronic indwelling foley -s/p rocephin, per primary   # Volume/ hypertension: EDW 80kg. Has been under EDW, UF as tolerated. New pre and post weight   # Anemia of Chronic Kidney Disease: Not on ESA's, monitor for now, transfuse for hgb <7   # Secondary Hyperparathyroidism/Hyperphosphatemia: Not on binders or VDRAs, check phos   # Vascular access: Has AVG that is being actively used. Needs TDC to be removed but patient as had issues coordinating this as an outpatient. Unfortunately, not able to be removed while he was admitted, will need to coordinate as an outpatient---discussed with [facility] staff   # Additional recommendations: - Dose all meds for creatinine clearance < 10 ml/min  - Unless absolutely necessary, no MRIs with gadolinium.  - Implement save arm precautions.  Prefer needle sticks in the dorsum of the hands or wrists.  No blood pressure measurements in arm. - If blood transfusion is requested during hemodialysis sessions, please alert [place] prior to the session.  - If a hemodialysis catheter line culture is requested, please alert [place] as only hemodialysis nurses are able to collect those specimens.     Gean Quint, MD [facility]  Subjective:   Intermittent hypotension with HD yesterday, net UF 1.5L. TDC was utilized. No complaints, pending discharge.   Objective:   BP 116/71 (BP Location: Right Arm)   Pulse 88   Temp 97.9 F (36.6 C) (Oral)   Resp 15   Ht 6' (1.829 m)   Wt 76.2 kg   SpO2 97%   BMI 22.78 kg/m   Intake/Output Summary (Last 24 hours) at 12/02/2020 0945 Last data filed at 12/02/2020 0920 Gross per 24 hour  Intake 680 ml  Output 1843 ml  Net -1163 ml   Weight change:   Physical  Exam: Gen:nad CVS:s1s2 Resp:normal wob VOZ:DGUY Ext:no edema Neuro: awake, alert Dialysis access: RIJ TDC, LUE AVG (+b/t)  Imaging: MR [facility] RIGHT WO CONTRAST  Result Date: 11/30/2020 CLINICAL DATA:  Sudden right thigh pain.  Difficulty ambulating. EXAM: MRI OF THE RIGHT FEMUR WITHOUT CONTRAST TECHNIQUE: Multiplanar, multisequence MR imaging of the right femur was performed. No intravenous contrast was administered. COMPARISON:  CT scan 11/29/2020 FINDINGS: Both hips are normally located. Mild age related degenerative changes but no stress fracture or AVN. Both femurs are intact. No bone lesions or stress fracture. Moderate degenerative changes are noted at both knee joints. There are also changes of synovitis bilaterally but much more pronounced on the left side. These could be rice bodies or PVNS. 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 The quadriceps and hamstring tendons are intact. Mild edema like signal changes noted in medial adductor muscles possibly reflecting a muscle strain or partial tear. The quadriceps and hamstring musculature are unremarkable. No acute tear or intramuscular hematoma. Small intramuscular lipoma is noted in the quadriceps femoris muscle. Small right inguinal hernia containing fat. No significant vascular abnormalities or adenopathy. IMPRESSION: 1. Moderate degenerative changes at both knee joints and bilateral joint effusions and synovitis. 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 2. Both hips are normally located. Mild degenerative changes but no stress fracture or  AVN. 3. Mild edema like signal changes in the medial adductor muscles possibly reflecting a muscle strain or partial tear. 4. No significant intrapelvic abnormalities are identified. Electronically Signed   By: Marijo Sanes M.D.   On: 11/30/2020 12:16    Labs: BMET Recent Labs  Lab  11/29/20 1721 12/01/20 0939 12/02/20 0502  NA 137 139 136  K 3.4* 3.1* 2.8*  CL 101 105 98  CO2 25 23 29   GLUCOSE 92 137* 109*  BUN 31* 40* 18  CREATININE 5.77* 6.37* 4.20*  CALCIUM 8.3* 8.4* 8.1*  PHOS  --  3.9 3.1   CBC Recent Labs  Lab 11/29/20 1721 12/01/20 0939 12/02/20 0502  WBC 6.4 5.5 6.7  NEUTROABS 4.3  --   --   HGB 10.4* 10.5* 10.7*  HCT 31.8* 33.4* 33.6*  MCV 98.5 98.5 98.2  PLT 238 228 226    Medications:     Chlorhexidine Gluconate Cloth  6 each Topical Daily   heparin  5,000 Units Subcutaneous Q8H   LORazepam  0.5 mg Intravenous Once   NIFEdipine  60 mg Oral Daily   pantoprazole  40 mg Oral Daily   simvastatin  40 mg Oral Daily      Gean Quint, MD [facility] 12/02/2020, 9:45 AM

## 2020-12-02 NOTE — Discharge Summary (Signed)
Discharge Summary  Glenn Olson PJK:[id number] DOB: 05-07-1940  PCP: Celene Squibb, MD  Admit date: 11/29/2020 Discharge date: 12/02/2020  Time spent: 35 minutes.    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    Discharge Condition: Stable  Diet recommendation: Resume previous diet  Vitals:   12/02/20 0549 12/02/20 1231  BP: 116/71 126/81  Pulse: 88 95  Resp: 15 18  Temp: 97.9 F (36.6 C) 98 F (36.7 C)  SpO2: 97% 98%    History of present illness:    Glenn Olson is a 80 y.o. male with medical history significant for atonic neurogenic bladder, gout, essential hypertension, GERD, hyperlipidemia, chronic urinary retention with indwelling Foley catheter, who presented to [facility] ED with complaints of right thigh pain.  MRI right femur without contrast showed moderate degenerative changes of both knees and bilateral joint effusion and synovitis possible PVNS on the left.  Mild edema like signal changes in the medial abductor muscle possibly reflecting a muscle strain or partial tear.  12/02/2020: Patient had hemodialysis on 12/01/2020.  He has no new complaints today.  He is eager to go home.  Hospital Course:  Principal Problem:   Lower extremity pain, right Active Problems:   Atonic neurogenic bladder   ESRD on hemodialysis ([facility])   Chronic indwelling Foley catheter  Right lower extremity pain- Pelvic x-ray and CT without acute abnormality to explain pain.  Patient presented unable to ambulate. -MRI right thigh showing results mentioned above. Pain gradually improved throughout his  stay. PT OT assessed and had no further recommendations.   UTI ruled out Chronic indwelling Foley catheter UA positive for pyuria, urine culture multiple species present. Rocephin discontinued on 12/01/2020 Continue indwelling Foley catheter Will need to follow-up with his urologist outpatient.   ESRD on HD schedule Tuesday Thursday Saturday.   HD on 12/01/2020.  Rest of management per nephrology   HTN-  BP is at goal. Continue home nifedipine.    Code Status: Full code  Consults called: nephrology   Discharge Exam: BP 126/81 (BP Location: Left Arm)   Pulse 95   Temp 98 F (36.7 C) (Oral)   Resp 18   Ht 6' (1.829 m)   Wt 76.2 kg   SpO2 98%   BMI 22.78 kg/m  General: 80 y.o. year-old male well developed well nourished in no acute distress.  Alert and oriented x3. Cardiovascular: Regular rate and rhythm with no rubs or gallops.  No thyromegaly or JVD noted.   Respiratory: Clear to auscultation with no wheezes or rales. Good inspiratory effort. Abdomen: Soft nontender nondistended with normal bowel sounds x4 quadrants. Musculoskeletal: No lower extremity edema. 2/4 pulses in all 4 extremities. Skin: No ulcerative lesions noted or rashes, Psychiatry: Mood is appropriate for condition and setting  Discharge Instructions You were cared for by a hospitalist during your hospital stay. If you have any questions about your discharge medications or the care you received while you were in the hospital after you are discharged, you can call the unit and asked to speak with the hospitalist on call if the hospitalist that took care of you is not available. Once you  are discharged, your primary care physician will handle any further medical issues. Please note that NO REFILLS for any discharge medications will be authorized once you are discharged, as it is imperative that you return to your primary care physician (or establish a relationship with a primary care physician if you do not have  one) for your aftercare needs so that they can reassess your need for medications and monitor your lab values.   Allergies as of 12/02/2020   No Known Allergies      Medication List     TAKE these medications    aspirin EC 81 MG tablet Take 81 mg by mouth daily.   colchicine 0.6 MG tablet Take 0.6 mg by mouth daily as needed (gout/knee flare).   lidocaine-prilocaine cream Commonly known as: EMLA Apply 1 application topically. Apply small amount to access sit (AVT) 1 to 2 hours before Dialysis. Cover with occlusive dressing   NIFEdipine 60 MG 24 hr tablet Commonly known as: PROCARDIA XL/NIFEDICAL XL Take 60 mg by mouth daily.   omeprazole 20 MG capsule Commonly known as: PRILOSEC Take 20 mg by mouth daily.   simvastatin 40 MG tablet Commonly known as: ZOCOR Take 40 mg by mouth in the morning.       No Known Allergies  Follow-up Information     Mordecai Rasmussen, MD. Schedule an appointment as soon as possible for a visit .   Specialties: Orthopedic Surgery, Sports Medicine Contact information: [place]. [address] [place] [address] [phone]         Celene Squibb, MD .   Specialty: Internal Medicine Why: As needed Contact information: [place] [address] [phone]                  The results of significant diagnostics from this hospitalization (including imaging, microbiology, ancillary and laboratory) are listed below for reference.    Significant Diagnostic Studies: CT Hip Right Wo Contrast  Result Date: 11/29/2020 CLINICAL DATA:  Right hip pain. Osteo necrosis suspected. Pain began this morning. EXAM: CT OF THE RIGHT HIP WITHOUT CONTRAST TECHNIQUE: Multidetector CT imaging of the right hip was performed according to the standard protocol. Multiplanar CT image reconstructions were also generated. COMPARISON:  Current right hip radiographs. FINDINGS: Bones/Joint/Cartilage No fracture. No bone lesion. There is no evidence  of right femoral head osteonecrosis/avascular necrosis. Right hip joint is normally spaced and aligned. No significant degenerative/arthropathic change. No joint effusion. Ligaments Suboptimally assessed by CT. Muscles and Tendons Unremarkable. Soft tissues Bladder wall is thickened. There is a right posterior bladder diverticulum. Small bubbles of air projects within the bladder wall anteriorly. Patient has a suprapubic catheter and an enlarged prostate, both of these findings incompletely imaged. IMPRESSION: 1. No fracture or acute skeletal abnormality. No evidence of avascular necrosis/osteonecrosis. 2. Normally spaced and aligned right hip joint. No significant degenerative/arthropathic change. 3. Abnormal appearance of bladder including air projecting within the anterior wall. 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 The air within wall potentially be from the indwelling suprapubic catheter. Enlarged prostate. Electronically Signed   By: Lajean Manes M.D.   On: 11/29/2020 18:22   MR OIBBC RIGHT WO CONTRAST  Result Date: 11/30/2020 CLINICAL DATA:  Sudden right thigh pain.  Difficulty ambulating. EXAM: MRI OF THE RIGHT FEMUR WITHOUT CONTRAST TECHNIQUE: Multiplanar, multisequence MR imaging of the right femur was performed. No intravenous contrast was administered. COMPARISON:  CT scan 11/29/2020 FINDINGS: Both hips are normally located.  Mild age related degenerative changes but no stress fracture or AVN. Both femurs are intact. No bone lesions or stress fracture. Moderate degenerative changes are noted at both knee joints. There are also changes of synovitis bilaterally but much more pronounced on the left side. These could be rice bodies or PVNS. 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 The quadriceps and hamstring tendons are intact. Mild edema like signal  changes noted in medial adductor muscles possibly reflecting a muscle strain or partial tear. The quadriceps and hamstring musculature are unremarkable. No acute tear or intramuscular hematoma. Small intramuscular lipoma is noted in the quadriceps femoris muscle. Small right inguinal hernia containing fat. No significant vascular abnormalities or adenopathy. IMPRESSION: 1. Moderate degenerative changes at both knee joints and bilateral joint effusions and synovitis. 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 2. Both hips are normally located. Mild degenerative changes but no stress fracture or AVN. 3. Mild edema like signal changes in the medial adductor muscles possibly reflecting a muscle strain or partial tear. 4. No significant intrapelvic abnormalities are identified. Electronically Signed   By: Marijo Sanes M.D.   On: 11/30/2020 12:16   DG Hip Unilat W or Wo Pelvis 2-3 Views Right  Result Date: 11/29/2020 CLINICAL DATA:  Acute right hip pain without known injury. EXAM: DG HIP (WITH OR WITHOUT PELVIS) 2-3V RIGHT COMPARISON:  None. FINDINGS: There is no evidence of hip fracture or dislocation. There is no evidence of arthropathy or other focal bone abnormality. IMPRESSION: Negative. Electronically Signed   By: Marijo Conception M.D.   On: 11/29/2020 15:45    Microbiology: Recent Results (from the past 240 hour(s))  Urine Culture     Status: Abnormal   Collection Time: 11/29/20  5:05 PM   Specimen: Urine, Clean Catch  Result Value Ref Range Status   Specimen Description   Final    URINE, CLEAN CATCH Performed at [facility], [address], [place], [place] [address]    Special Requests   Final    NONE Performed at [facility], [address], [place], [place] [address]    Culture MULTIPLE SPECIES PRESENT, SUGGEST RECOLLECTION (A)  Final   Report Status 12/01/2020 FINAL  Final  Resp Panel by RT-PCR (Flu A&B, Covid) Nasopharyngeal Swab     Status: None   Collection Time: 11/29/20  7:52 PM   Specimen:  Nasopharyngeal Swab; Nasopharyngeal(NP) swabs in vial transport medium  Result Value Ref Range Status   SARS Coronavirus 2 by RT PCR NEGATIVE NEGATIVE Final    Comment: (NOTE) SARS-CoV-2 target nucleic acids are NOT DETECTED.  The SARS-CoV-2 RNA is generally detectable in upper respiratory specimens during the acute phase of infection. The lowest concentration of SARS-CoV-2 viral copies this assay can detect is 138 copies/mL. A negative result does not preclude SARS-Cov-2 infection and should not be used as the sole basis for treatment or other patient management decisions. A negative result may occur with  improper specimen collection/handling, submission of specimen other than nasopharyngeal swab, presence of viral mutation(s) within the areas targeted by this assay, and inadequate number of viral copies(<138 copies/mL). A negative result must be combined with clinical observations, patient history, and epidemiological information. The expected result is Negative.  Fact Sheet for Patients:  EntrepreneurPulse.com.au  Fact Sheet for Healthcare Providers:  IncredibleEmployment.be  This test is no t yet approved or cleared by the  [place] Architectural technologist and  has been authorized for detection and/or diagnosis of SARS-CoV-2 by FDA under an Print production planner (EUA). This EUA will remain  in effect (meaning this test can be used) for the duration of the COVID-19 declaration under Section 564(b)(1) of the Act, 21 U.S.C.section 360bbb-3(b)(1), unless the authorization is terminated  or revoked sooner.       Influenza A by PCR NEGATIVE NEGATIVE Final   Influenza B by PCR NEGATIVE NEGATIVE Final    Comment: (NOTE) The Xpert Xpress SARS-CoV-2/FLU/RSV plus assay is intended as an aid in the diagnosis of influenza from Nasopharyngeal swab specimens and should not be used as a sole basis for treatment. Nasal washings and aspirates are unacceptable for  Xpert Xpress SARS-CoV-2/FLU/RSV testing.  Fact Sheet for Patients: EntrepreneurPulse.com.au  Fact Sheet for Healthcare Providers: IncredibleEmployment.be  This test is not yet approved or cleared by the [place] FDA and has been authorized for detection and/or diagnosis of SARS-CoV-2 by FDA under an Emergency Use Authorization (EUA). This EUA will remain in effect (meaning this test can be used) for the duration of the COVID-19 declaration under Section 564(b)(1) of the Act, 21 U.S.C. section 360bbb-3(b)(1), unless the authorization is terminated or revoked.  Performed at [facility], [address] [place], [place] [address]      Labs: Basic Metabolic Panel: Recent Labs  Lab 11/29/20 1721 11/29/20 1723 12/01/20 0939 12/02/20 0502  NA 137  --  139 136  K 3.4*  --  3.1* 2.8*  CL 101  --  105 98  CO2 25  --  23 29  GLUCOSE 92  --  137* 109*  BUN 31*  --  40* 18  CREATININE 5.77*  --  6.37* 4.20*  CALCIUM 8.3*  --  8.4* 8.1*  MG  --  1.8  --   --   PHOS  --   --  3.9 3.1   Liver Function Tests: Recent Labs  Lab 12/01/20 0939 12/02/20 0502  ALBUMIN 3.3* 3.2*   No results for input(s): LIPASE, AMYLASE in the last 168 hours. No results for input(s): AMMONIA in the last 168 hours. CBC: Recent Labs  Lab 11/29/20 1721 12/01/20 0939 12/02/20 0502  WBC 6.4 5.5 6.7  NEUTROABS 4.3  --   --   HGB 10.4* 10.5* 10.7*  HCT 31.8* 33.4* 33.6*  MCV 98.5 98.5 98.2  PLT 238 228 226   Cardiac Enzymes: No results for input(s): CKTOTAL, CKMB, CKMBINDEX, TROPONINI in the last 168 hours. BNP: BNP (last 3 results) No results for input(s): BNP in the last 8760 hours.  ProBNP (last 3 results) No results for input(s): PROBNP in the last 8760 hours.  CBG: No results for input(s): GLUCAP in the last 168 hours.     Signed:  Kayleen Memos, MD [facility] 12/02/2020, 6:41 PM

## 2020-12-02 NOTE — TOC Transition Note (Signed)
Transition of Care [facility]) - CM/SW Discharge Note   Patient Details  Name: Glenn Olson MRN: [id number] Date of Birth: 1940/07/02  Transition of Care [facility]) CM/SW Contact:  Iona Beard, Marty Phone Number: 12/02/2020, 9:53 AM   Clinical Narrative:    [facility] consulted for HH/DME needs. CSW spoke with pt who states that he worked with PT and OT and did well. Pt states that he feels comfortable returning home without services. Pt states he is not interested in [facility]. TOC signing off.   Final next level of care: Home/Self Care Barriers to Discharge: No Barriers Identified   Patient Goals and CMS Choice Patient states their goals for this hospitalization and ongoing recovery are:: Return home CMS Medicare.gov Compare Post Acute Care list provided to:: Patient Choice offered to / list presented to : Patient  Discharge Placement                       Discharge Plan and Services In-house Referral: Clinical Social Work Discharge Planning Services: CM Consult                                 Social Determinants of Health (SDOH) Interventions     Readmission Risk Interventions Readmission Risk Prevention Plan 12/02/2020  Medication Screening Complete  Transportation Screening Complete  Some recent data might be hidden

## 2020-12-02 NOTE — Progress Notes (Signed)
Discharge instructions given; pt verbalized understanding

## 2020-12-02 NOTE — Plan of Care (Signed)

## 2020-12-03 DIAGNOSIS — N186 End stage renal disease: Secondary | ICD-10-CM | POA: Diagnosis not present

## 2020-12-03 DIAGNOSIS — Z23 Encounter for immunization: Secondary | ICD-10-CM | POA: Diagnosis not present

## 2020-12-03 DIAGNOSIS — D631 Anemia in chronic kidney disease: Secondary | ICD-10-CM | POA: Diagnosis not present

## 2020-12-03 DIAGNOSIS — N2581 Secondary hyperparathyroidism of renal origin: Secondary | ICD-10-CM | POA: Diagnosis not present

## 2020-12-03 DIAGNOSIS — Z992 Dependence on renal dialysis: Secondary | ICD-10-CM | POA: Diagnosis not present

## 2020-12-06 ENCOUNTER — Ambulatory Visit (HOSPITAL_COMMUNITY)
Admission: RE | Admit: 2020-12-06 | Discharge: 2020-12-06 | Disposition: A | Discharge status: Home / Self Care | Payer: Medicare Other | Attending: Vascular Surgery | Admitting: Vascular Surgery

## 2020-12-06 ENCOUNTER — Encounter (HOSPITAL_COMMUNITY): Payer: Self-pay | Admitting: Vascular Surgery

## 2020-12-06 ENCOUNTER — Encounter (HOSPITAL_COMMUNITY)
Admission: RE | Disposition: A | Discharge status: Home / Self Care | Payer: Self-pay | Source: Home / Self Care | Attending: Vascular Surgery

## 2020-12-06 ENCOUNTER — Other Ambulatory Visit: Payer: Self-pay

## 2020-12-06 ENCOUNTER — Ambulatory Visit (INDEPENDENT_AMBULATORY_CARE_PROVIDER_SITE_OTHER): Payer: Medicare Other

## 2020-12-06 DIAGNOSIS — Z452 Encounter for adjustment and management of vascular access device: Secondary | ICD-10-CM | POA: Diagnosis not present

## 2020-12-06 DIAGNOSIS — Z4901 Encounter for fitting and adjustment of extracorporeal dialysis catheter: Secondary | ICD-10-CM | POA: Diagnosis not present

## 2020-12-06 DIAGNOSIS — N186 End stage renal disease: Secondary | ICD-10-CM | POA: Insufficient documentation

## 2020-12-06 DIAGNOSIS — N312 Flaccid neuropathic bladder, not elsewhere classified: Secondary | ICD-10-CM

## 2020-12-06 DIAGNOSIS — Z992 Dependence on renal dialysis: Secondary | ICD-10-CM

## 2020-12-06 DIAGNOSIS — N319 Neuromuscular dysfunction of bladder, unspecified: Secondary | ICD-10-CM | POA: Diagnosis not present

## 2020-12-06 DIAGNOSIS — R339 Retention of urine, unspecified: Secondary | ICD-10-CM | POA: Diagnosis not present

## 2020-12-06 HISTORY — PX: REMOVAL OF A DIALYSIS CATHETER: SHX6053

## 2020-12-06 SURGERY — REMOVAL, DIALYSIS CATHETER
Anesthesia: LOCAL

## 2020-12-06 SURGICAL SUPPLY — 20 items
APPLICATOR CHLORAPREP 10.5 ORG (MISCELLANEOUS) ×3 IMPLANT
CLOTH BEACON ORANGE TIMEOUT ST (SAFETY) ×3 IMPLANT
DECANTER SPIKE VIAL GLASS SM (MISCELLANEOUS) ×3 IMPLANT
DERMABOND ADVANCED (GAUZE/BANDAGES/DRESSINGS) ×2
DERMABOND ADVANCED .7 DNX12 (GAUZE/BANDAGES/DRESSINGS) ×1 IMPLANT
DRAPE HALF SHEET 40X57 (DRAPES) IMPLANT
ELECT REM PT RETURN 9FT ADLT (ELECTROSURGICAL) ×3
ELECTRODE REM PT RTRN 9FT ADLT (ELECTROSURGICAL) ×1 IMPLANT
GLOVE SURG POLYISO LF SZ7.5 (GLOVE) IMPLANT
GLOVE SURG UNDER POLY LF SZ7 (GLOVE) IMPLANT
GOWN STRL REUS W/TWL LRG LVL3 (GOWN DISPOSABLE) IMPLANT
NEEDLE HYPO 25X1 1.5 SAFETY (NEEDLE) ×3 IMPLANT
PENCIL SMOKE EVACUATOR COATED (MISCELLANEOUS) IMPLANT
SPONGE GAUZE 2X2 8PLY STER LF (GAUZE/BANDAGES/DRESSINGS) ×1
SPONGE GAUZE 2X2 8PLY STRL LF (GAUZE/BANDAGES/DRESSINGS) ×2 IMPLANT
SUT MNCRL AB 4-0 PS2 18 (SUTURE) ×3 IMPLANT
SUT VIC AB 3-0 SH 27 (SUTURE) ×2
SUT VIC AB 3-0 SH 27X BRD (SUTURE) ×1 IMPLANT
SYR CONTROL 10ML LL (SYRINGE) ×3 IMPLANT
TOWEL OR 17X26 4PK STRL BLUE (TOWEL DISPOSABLE) ×3 IMPLANT

## 2020-12-06 NOTE — Patient Instructions (Signed)

## 2020-12-06 NOTE — Interval H&P Note (Signed)
History and Physical Interval Note:  12/06/2020 8:38 AM  Glenn Olson  has presented today for surgery, with the diagnosis of ESRD.  The various methods of treatment have been discussed with the patient and family. After consideration of risks, benefits and other options for treatment, the patient has consented to  Procedure(s): MINOR REMOVAL OF A DIALYSIS CATHETER (N/A) as a surgical intervention.  The patient's history has been reviewed, patient examined, no change in status, stable for surgery.  I have reviewed the patient's chart and labs.  Questions were answered to the patient's satisfaction.     Curt Jews

## 2020-12-06 NOTE — Op Note (Signed)
    OPERATIVE REPORT  DATE OF SURGERY: 12/06/2020  PATIENT: Glenn Olson, 80 y.o. male MRN: [id number]  DOB: [birth year]  PRE-OPERATIVE DIAGNOSIS: End-stage renal disease  POST-OPERATIVE DIAGNOSIS:  Same  PROCEDURE: Removal of right IJ tunneled hemodialysis catheter  SURGEON:  Curt Jews, M.D.  PHYSICIAN ASSISTANT: Nurse  The assistant was needed for exposure and to expedite the case  ANESTHESIA: Local  EBL: per anesthesia record  No intake/output data recorded.  BLOOD ADMINISTERED: none  DRAINS: none  SPECIMEN: none  COUNTS CORRECT:  YES  PATIENT DISPOSITION:  PACU - hemodynamically stable  PROCEDURE DETAILS: The patient was placed in the procedural area.  The area of the catheter was prepped and draped in usual sterile fashion.  Local anesthesia was used to anesthetize the area around the subcutaneous felt cuff.  This was mobilized bluntly with hemostats and the catheter was removed in its entirety.  The catheter was off the field.  Hemostasis was held with pressure.  Sterile dressing was applied and the patient was discharged to home   Rosetta Posner, M.D., [facility] 12/06/2020 9:17 AM  Note: Portions of this report may have been transcribed using voice recognition software.  Every effort has been made to ensure accuracy; however, inadvertent computerized transcription errors may still be present.

## 2020-12-06 NOTE — Progress Notes (Signed)
Suprapubic Cath Change  Patient is present today for a suprapubic catheter change due to urinary retention.  69ml of water was drained from the balloon, a 22FR foley cath was removed from the tract with out difficulty.  Site was cleaned and prepped in a sterile fashion with betadine.  A 22FR foley cath was replaced into the tract no complications were noted. Urine return was noted, 10 ml of sterile water was inflated into the balloon and a cap  was attached for drainage.  Patient tolerated well.   Performed by: Lalana Wachter LPN  Follow up: Keep next scheduled [place]

## 2020-12-07 ENCOUNTER — Encounter (HOSPITAL_COMMUNITY): Payer: Self-pay | Admitting: Vascular Surgery

## 2020-12-10 DIAGNOSIS — Z23 Encounter for immunization: Secondary | ICD-10-CM | POA: Diagnosis not present

## 2020-12-10 DIAGNOSIS — Z992 Dependence on renal dialysis: Secondary | ICD-10-CM | POA: Diagnosis not present

## 2020-12-10 DIAGNOSIS — N2581 Secondary hyperparathyroidism of renal origin: Secondary | ICD-10-CM | POA: Diagnosis not present

## 2020-12-10 DIAGNOSIS — N186 End stage renal disease: Secondary | ICD-10-CM | POA: Diagnosis not present

## 2020-12-10 DIAGNOSIS — D631 Anemia in chronic kidney disease: Secondary | ICD-10-CM | POA: Diagnosis not present

## 2020-12-13 DIAGNOSIS — N186 End stage renal disease: Secondary | ICD-10-CM | POA: Diagnosis not present

## 2020-12-13 DIAGNOSIS — Z23 Encounter for immunization: Secondary | ICD-10-CM | POA: Diagnosis not present

## 2020-12-13 DIAGNOSIS — N2581 Secondary hyperparathyroidism of renal origin: Secondary | ICD-10-CM | POA: Diagnosis not present

## 2020-12-13 DIAGNOSIS — Z992 Dependence on renal dialysis: Secondary | ICD-10-CM | POA: Diagnosis not present

## 2020-12-13 DIAGNOSIS — D631 Anemia in chronic kidney disease: Secondary | ICD-10-CM | POA: Diagnosis not present

## 2020-12-15 DIAGNOSIS — N2581 Secondary hyperparathyroidism of renal origin: Secondary | ICD-10-CM | POA: Diagnosis not present

## 2020-12-15 DIAGNOSIS — D631 Anemia in chronic kidney disease: Secondary | ICD-10-CM | POA: Diagnosis not present

## 2020-12-15 DIAGNOSIS — Z992 Dependence on renal dialysis: Secondary | ICD-10-CM | POA: Diagnosis not present

## 2020-12-15 DIAGNOSIS — Z23 Encounter for immunization: Secondary | ICD-10-CM | POA: Diagnosis not present

## 2020-12-15 DIAGNOSIS — N186 End stage renal disease: Secondary | ICD-10-CM | POA: Diagnosis not present

## 2020-12-17 DIAGNOSIS — D631 Anemia in chronic kidney disease: Secondary | ICD-10-CM | POA: Diagnosis not present

## 2020-12-17 DIAGNOSIS — N186 End stage renal disease: Secondary | ICD-10-CM | POA: Diagnosis not present

## 2020-12-17 DIAGNOSIS — N2581 Secondary hyperparathyroidism of renal origin: Secondary | ICD-10-CM | POA: Diagnosis not present

## 2020-12-17 DIAGNOSIS — Z23 Encounter for immunization: Secondary | ICD-10-CM | POA: Diagnosis not present

## 2020-12-17 DIAGNOSIS — Z992 Dependence on renal dialysis: Secondary | ICD-10-CM | POA: Diagnosis not present

## 2020-12-20 DIAGNOSIS — D631 Anemia in chronic kidney disease: Secondary | ICD-10-CM | POA: Diagnosis not present

## 2020-12-20 DIAGNOSIS — N2581 Secondary hyperparathyroidism of renal origin: Secondary | ICD-10-CM | POA: Diagnosis not present

## 2020-12-20 DIAGNOSIS — Z992 Dependence on renal dialysis: Secondary | ICD-10-CM | POA: Diagnosis not present

## 2020-12-20 DIAGNOSIS — N186 End stage renal disease: Secondary | ICD-10-CM | POA: Diagnosis not present

## 2020-12-20 DIAGNOSIS — Z23 Encounter for immunization: Secondary | ICD-10-CM | POA: Diagnosis not present

## 2020-12-24 DIAGNOSIS — Z992 Dependence on renal dialysis: Secondary | ICD-10-CM | POA: Diagnosis not present

## 2020-12-24 DIAGNOSIS — N186 End stage renal disease: Secondary | ICD-10-CM | POA: Diagnosis not present

## 2020-12-24 DIAGNOSIS — Z23 Encounter for immunization: Secondary | ICD-10-CM | POA: Diagnosis not present

## 2020-12-24 DIAGNOSIS — N2581 Secondary hyperparathyroidism of renal origin: Secondary | ICD-10-CM | POA: Diagnosis not present

## 2020-12-24 DIAGNOSIS — D631 Anemia in chronic kidney disease: Secondary | ICD-10-CM | POA: Diagnosis not present

## 2020-12-25 DIAGNOSIS — D631 Anemia in chronic kidney disease: Secondary | ICD-10-CM | POA: Diagnosis not present

## 2020-12-25 DIAGNOSIS — Z23 Encounter for immunization: Secondary | ICD-10-CM | POA: Diagnosis not present

## 2020-12-25 DIAGNOSIS — N2581 Secondary hyperparathyroidism of renal origin: Secondary | ICD-10-CM | POA: Diagnosis not present

## 2020-12-25 DIAGNOSIS — Z992 Dependence on renal dialysis: Secondary | ICD-10-CM | POA: Diagnosis not present

## 2020-12-25 DIAGNOSIS — N186 End stage renal disease: Secondary | ICD-10-CM | POA: Diagnosis not present

## 2020-12-27 ENCOUNTER — Ambulatory Visit: Payer: Medicare Other

## 2020-12-27 DIAGNOSIS — Z992 Dependence on renal dialysis: Secondary | ICD-10-CM | POA: Diagnosis not present

## 2020-12-27 DIAGNOSIS — N2581 Secondary hyperparathyroidism of renal origin: Secondary | ICD-10-CM | POA: Diagnosis not present

## 2020-12-27 DIAGNOSIS — N186 End stage renal disease: Secondary | ICD-10-CM | POA: Diagnosis not present

## 2020-12-27 DIAGNOSIS — D631 Anemia in chronic kidney disease: Secondary | ICD-10-CM | POA: Diagnosis not present

## 2020-12-27 DIAGNOSIS — Z23 Encounter for immunization: Secondary | ICD-10-CM | POA: Diagnosis not present

## 2020-12-29 DIAGNOSIS — Z992 Dependence on renal dialysis: Secondary | ICD-10-CM | POA: Diagnosis not present

## 2020-12-29 DIAGNOSIS — N186 End stage renal disease: Secondary | ICD-10-CM | POA: Diagnosis not present

## 2020-12-29 DIAGNOSIS — I120 Hypertensive chronic kidney disease with stage 5 chronic kidney disease or end stage renal disease: Secondary | ICD-10-CM | POA: Diagnosis not present

## 2020-12-29 DIAGNOSIS — E876 Hypokalemia: Secondary | ICD-10-CM | POA: Diagnosis not present

## 2020-12-29 DIAGNOSIS — D631 Anemia in chronic kidney disease: Secondary | ICD-10-CM | POA: Diagnosis not present

## 2020-12-29 DIAGNOSIS — N2581 Secondary hyperparathyroidism of renal origin: Secondary | ICD-10-CM | POA: Diagnosis not present

## 2020-12-29 DIAGNOSIS — Z23 Encounter for immunization: Secondary | ICD-10-CM | POA: Diagnosis not present

## 2020-12-31 DIAGNOSIS — Z992 Dependence on renal dialysis: Secondary | ICD-10-CM | POA: Diagnosis not present

## 2020-12-31 DIAGNOSIS — E876 Hypokalemia: Secondary | ICD-10-CM | POA: Diagnosis not present

## 2020-12-31 DIAGNOSIS — D631 Anemia in chronic kidney disease: Secondary | ICD-10-CM | POA: Diagnosis not present

## 2020-12-31 DIAGNOSIS — N2581 Secondary hyperparathyroidism of renal origin: Secondary | ICD-10-CM | POA: Diagnosis not present

## 2020-12-31 DIAGNOSIS — N186 End stage renal disease: Secondary | ICD-10-CM | POA: Diagnosis not present

## 2020-12-31 DIAGNOSIS — Z23 Encounter for immunization: Secondary | ICD-10-CM | POA: Diagnosis not present

## 2021-01-03 DIAGNOSIS — Z992 Dependence on renal dialysis: Secondary | ICD-10-CM | POA: Diagnosis not present

## 2021-01-03 DIAGNOSIS — D631 Anemia in chronic kidney disease: Secondary | ICD-10-CM | POA: Diagnosis not present

## 2021-01-03 DIAGNOSIS — E876 Hypokalemia: Secondary | ICD-10-CM | POA: Diagnosis not present

## 2021-01-03 DIAGNOSIS — N186 End stage renal disease: Secondary | ICD-10-CM | POA: Diagnosis not present

## 2021-01-03 DIAGNOSIS — N2581 Secondary hyperparathyroidism of renal origin: Secondary | ICD-10-CM | POA: Diagnosis not present

## 2021-01-03 DIAGNOSIS — Z23 Encounter for immunization: Secondary | ICD-10-CM | POA: Diagnosis not present

## 2021-01-05 DIAGNOSIS — N186 End stage renal disease: Secondary | ICD-10-CM | POA: Diagnosis not present

## 2021-01-05 DIAGNOSIS — Z23 Encounter for immunization: Secondary | ICD-10-CM | POA: Diagnosis not present

## 2021-01-05 DIAGNOSIS — E876 Hypokalemia: Secondary | ICD-10-CM | POA: Diagnosis not present

## 2021-01-05 DIAGNOSIS — D631 Anemia in chronic kidney disease: Secondary | ICD-10-CM | POA: Diagnosis not present

## 2021-01-05 DIAGNOSIS — N2581 Secondary hyperparathyroidism of renal origin: Secondary | ICD-10-CM | POA: Diagnosis not present

## 2021-01-05 DIAGNOSIS — Z992 Dependence on renal dialysis: Secondary | ICD-10-CM | POA: Diagnosis not present

## 2021-01-07 DIAGNOSIS — Z23 Encounter for immunization: Secondary | ICD-10-CM | POA: Diagnosis not present

## 2021-01-07 DIAGNOSIS — N186 End stage renal disease: Secondary | ICD-10-CM | POA: Diagnosis not present

## 2021-01-07 DIAGNOSIS — E876 Hypokalemia: Secondary | ICD-10-CM | POA: Diagnosis not present

## 2021-01-07 DIAGNOSIS — Z992 Dependence on renal dialysis: Secondary | ICD-10-CM | POA: Diagnosis not present

## 2021-01-07 DIAGNOSIS — D631 Anemia in chronic kidney disease: Secondary | ICD-10-CM | POA: Diagnosis not present

## 2021-01-07 DIAGNOSIS — N2581 Secondary hyperparathyroidism of renal origin: Secondary | ICD-10-CM | POA: Diagnosis not present

## 2021-01-10 DIAGNOSIS — N186 End stage renal disease: Secondary | ICD-10-CM | POA: Diagnosis not present

## 2021-01-10 DIAGNOSIS — Z992 Dependence on renal dialysis: Secondary | ICD-10-CM | POA: Diagnosis not present

## 2021-01-10 DIAGNOSIS — E876 Hypokalemia: Secondary | ICD-10-CM | POA: Diagnosis not present

## 2021-01-10 DIAGNOSIS — D631 Anemia in chronic kidney disease: Secondary | ICD-10-CM | POA: Diagnosis not present

## 2021-01-10 DIAGNOSIS — N2581 Secondary hyperparathyroidism of renal origin: Secondary | ICD-10-CM | POA: Diagnosis not present

## 2021-01-10 DIAGNOSIS — Z23 Encounter for immunization: Secondary | ICD-10-CM | POA: Diagnosis not present

## 2021-01-11 ENCOUNTER — Ambulatory Visit: Payer: Medicare Other

## 2021-01-12 DIAGNOSIS — Z23 Encounter for immunization: Secondary | ICD-10-CM | POA: Diagnosis not present

## 2021-01-12 DIAGNOSIS — E876 Hypokalemia: Secondary | ICD-10-CM | POA: Diagnosis not present

## 2021-01-12 DIAGNOSIS — N186 End stage renal disease: Secondary | ICD-10-CM | POA: Diagnosis not present

## 2021-01-12 DIAGNOSIS — N2581 Secondary hyperparathyroidism of renal origin: Secondary | ICD-10-CM | POA: Diagnosis not present

## 2021-01-12 DIAGNOSIS — D631 Anemia in chronic kidney disease: Secondary | ICD-10-CM | POA: Diagnosis not present

## 2021-01-12 DIAGNOSIS — Z992 Dependence on renal dialysis: Secondary | ICD-10-CM | POA: Diagnosis not present

## 2021-01-13 DIAGNOSIS — K219 Gastro-esophageal reflux disease without esophagitis: Secondary | ICD-10-CM | POA: Diagnosis not present

## 2021-01-13 DIAGNOSIS — E782 Mixed hyperlipidemia: Secondary | ICD-10-CM | POA: Diagnosis not present

## 2021-01-13 DIAGNOSIS — Z992 Dependence on renal dialysis: Secondary | ICD-10-CM | POA: Diagnosis not present

## 2021-01-13 DIAGNOSIS — R7301 Impaired fasting glucose: Secondary | ICD-10-CM | POA: Diagnosis not present

## 2021-01-13 DIAGNOSIS — Z0001 Encounter for general adult medical examination with abnormal findings: Secondary | ICD-10-CM | POA: Diagnosis not present

## 2021-01-13 DIAGNOSIS — I10 Essential (primary) hypertension: Secondary | ICD-10-CM | POA: Diagnosis not present

## 2021-01-13 DIAGNOSIS — N319 Neuromuscular dysfunction of bladder, unspecified: Secondary | ICD-10-CM | POA: Diagnosis not present

## 2021-01-13 DIAGNOSIS — E208 Other hypoparathyroidism: Secondary | ICD-10-CM | POA: Diagnosis not present

## 2021-01-13 DIAGNOSIS — D638 Anemia in other chronic diseases classified elsewhere: Secondary | ICD-10-CM | POA: Diagnosis not present

## 2021-01-13 DIAGNOSIS — E876 Hypokalemia: Secondary | ICD-10-CM | POA: Diagnosis not present

## 2021-01-13 DIAGNOSIS — F5221 Male erectile disorder: Secondary | ICD-10-CM | POA: Diagnosis not present

## 2021-01-14 DIAGNOSIS — N186 End stage renal disease: Secondary | ICD-10-CM | POA: Diagnosis not present

## 2021-01-14 DIAGNOSIS — Z992 Dependence on renal dialysis: Secondary | ICD-10-CM | POA: Diagnosis not present

## 2021-01-14 DIAGNOSIS — D631 Anemia in chronic kidney disease: Secondary | ICD-10-CM | POA: Diagnosis not present

## 2021-01-14 DIAGNOSIS — N2581 Secondary hyperparathyroidism of renal origin: Secondary | ICD-10-CM | POA: Diagnosis not present

## 2021-01-14 DIAGNOSIS — Z23 Encounter for immunization: Secondary | ICD-10-CM | POA: Diagnosis not present

## 2021-01-14 DIAGNOSIS — E876 Hypokalemia: Secondary | ICD-10-CM | POA: Diagnosis not present

## 2021-01-17 DIAGNOSIS — N2581 Secondary hyperparathyroidism of renal origin: Secondary | ICD-10-CM | POA: Diagnosis not present

## 2021-01-17 DIAGNOSIS — Z23 Encounter for immunization: Secondary | ICD-10-CM | POA: Diagnosis not present

## 2021-01-17 DIAGNOSIS — Z992 Dependence on renal dialysis: Secondary | ICD-10-CM | POA: Diagnosis not present

## 2021-01-17 DIAGNOSIS — E876 Hypokalemia: Secondary | ICD-10-CM | POA: Diagnosis not present

## 2021-01-17 DIAGNOSIS — N186 End stage renal disease: Secondary | ICD-10-CM | POA: Diagnosis not present

## 2021-01-17 DIAGNOSIS — D631 Anemia in chronic kidney disease: Secondary | ICD-10-CM | POA: Diagnosis not present

## 2021-01-19 DIAGNOSIS — Z23 Encounter for immunization: Secondary | ICD-10-CM | POA: Diagnosis not present

## 2021-01-19 DIAGNOSIS — N2581 Secondary hyperparathyroidism of renal origin: Secondary | ICD-10-CM | POA: Diagnosis not present

## 2021-01-19 DIAGNOSIS — N186 End stage renal disease: Secondary | ICD-10-CM | POA: Diagnosis not present

## 2021-01-19 DIAGNOSIS — Z992 Dependence on renal dialysis: Secondary | ICD-10-CM | POA: Diagnosis not present

## 2021-01-19 DIAGNOSIS — D631 Anemia in chronic kidney disease: Secondary | ICD-10-CM | POA: Diagnosis not present

## 2021-01-19 DIAGNOSIS — E876 Hypokalemia: Secondary | ICD-10-CM | POA: Diagnosis not present

## 2021-01-24 DIAGNOSIS — N186 End stage renal disease: Secondary | ICD-10-CM | POA: Diagnosis not present

## 2021-01-24 DIAGNOSIS — N2581 Secondary hyperparathyroidism of renal origin: Secondary | ICD-10-CM | POA: Diagnosis not present

## 2021-01-24 DIAGNOSIS — E876 Hypokalemia: Secondary | ICD-10-CM | POA: Diagnosis not present

## 2021-01-24 DIAGNOSIS — Z23 Encounter for immunization: Secondary | ICD-10-CM | POA: Diagnosis not present

## 2021-01-24 DIAGNOSIS — D631 Anemia in chronic kidney disease: Secondary | ICD-10-CM | POA: Diagnosis not present

## 2021-01-24 DIAGNOSIS — Z992 Dependence on renal dialysis: Secondary | ICD-10-CM | POA: Diagnosis not present

## 2021-01-26 DIAGNOSIS — N186 End stage renal disease: Secondary | ICD-10-CM | POA: Diagnosis not present

## 2021-01-26 DIAGNOSIS — Z23 Encounter for immunization: Secondary | ICD-10-CM | POA: Diagnosis not present

## 2021-01-26 DIAGNOSIS — N2581 Secondary hyperparathyroidism of renal origin: Secondary | ICD-10-CM | POA: Diagnosis not present

## 2021-01-26 DIAGNOSIS — E876 Hypokalemia: Secondary | ICD-10-CM | POA: Diagnosis not present

## 2021-01-26 DIAGNOSIS — Z992 Dependence on renal dialysis: Secondary | ICD-10-CM | POA: Diagnosis not present

## 2021-01-26 DIAGNOSIS — D631 Anemia in chronic kidney disease: Secondary | ICD-10-CM | POA: Diagnosis not present

## 2021-01-29 DIAGNOSIS — Z992 Dependence on renal dialysis: Secondary | ICD-10-CM | POA: Diagnosis not present

## 2021-01-29 DIAGNOSIS — N186 End stage renal disease: Secondary | ICD-10-CM | POA: Diagnosis not present

## 2021-01-29 DIAGNOSIS — I129 Hypertensive chronic kidney disease with stage 1 through stage 4 chronic kidney disease, or unspecified chronic kidney disease: Secondary | ICD-10-CM | POA: Diagnosis not present

## 2021-02-01 ENCOUNTER — Ambulatory Visit: Payer: Medicare Other

## 2021-02-06 ENCOUNTER — Encounter (HOSPITAL_COMMUNITY): Payer: Self-pay | Admitting: Emergency Medicine

## 2021-02-06 ENCOUNTER — Emergency Department (HOSPITAL_COMMUNITY): Payer: Medicare Other

## 2021-02-06 ENCOUNTER — Emergency Department (HOSPITAL_COMMUNITY)
Admission: EM | Admit: 2021-02-06 | Discharge: 2021-02-06 | Disposition: A | Discharge status: Home / Self Care | Payer: Medicare Other | Attending: Emergency Medicine | Admitting: Emergency Medicine

## 2021-02-06 ENCOUNTER — Other Ambulatory Visit: Payer: Self-pay

## 2021-02-06 DIAGNOSIS — Z9115 Patient's noncompliance with renal dialysis: Secondary | ICD-10-CM

## 2021-02-06 DIAGNOSIS — Z7982 Long term (current) use of aspirin: Secondary | ICD-10-CM | POA: Insufficient documentation

## 2021-02-06 DIAGNOSIS — R0602 Shortness of breath: Secondary | ICD-10-CM | POA: Diagnosis not present

## 2021-02-06 DIAGNOSIS — N186 End stage renal disease: Secondary | ICD-10-CM

## 2021-02-06 DIAGNOSIS — Z992 Dependence on renal dialysis: Secondary | ICD-10-CM | POA: Insufficient documentation

## 2021-02-06 DIAGNOSIS — R000 Tachycardia, unspecified: Secondary | ICD-10-CM | POA: Diagnosis not present

## 2021-02-06 DIAGNOSIS — R9431 Abnormal electrocardiogram [ECG] [EKG]: Secondary | ICD-10-CM | POA: Diagnosis not present

## 2021-02-06 DIAGNOSIS — E8770 Fluid overload, unspecified: Secondary | ICD-10-CM | POA: Diagnosis not present

## 2021-02-06 DIAGNOSIS — J811 Chronic pulmonary edema: Secondary | ICD-10-CM | POA: Diagnosis not present

## 2021-02-06 LAB — CBC
HCT: 30 % — ABNORMAL LOW (ref 39.0–52.0)
Hemoglobin: 9.8 g/dL — ABNORMAL LOW (ref 13.0–17.0)
MCH: 30.8 pg (ref 26.0–34.0)
MCHC: 32.7 g/dL (ref 30.0–36.0)
MCV: 94.3 fL (ref 80.0–100.0)
Platelets: 240 10*3/uL (ref 150–400)
RBC: 3.18 MIL/uL — ABNORMAL LOW (ref 4.22–5.81)
RDW: 14.9 % (ref 11.5–15.5)
WBC: 7.7 10*3/uL (ref 4.0–10.5)
nRBC: 0 % (ref 0.0–0.2)

## 2021-02-06 LAB — BASIC METABOLIC PANEL
Anion gap: 12 (ref 5–15)
BUN: 76 mg/dL — ABNORMAL HIGH (ref 8–23)
CO2: 17 mmol/L — ABNORMAL LOW (ref 22–32)
Calcium: 8.4 mg/dL — ABNORMAL LOW (ref 8.9–10.3)
Chloride: 108 mmol/L (ref 98–111)
Creatinine, Ser: 7.5 mg/dL — ABNORMAL HIGH (ref 0.61–1.24)
GFR, Estimated: 7 mL/min — ABNORMAL LOW (ref >60)
Glucose, Bld: 131 mg/dL — ABNORMAL HIGH (ref 70–99)
Potassium: 3.5 mmol/L (ref 3.5–5.1)
Sodium: 137 mmol/L (ref 135–145)

## 2021-02-06 NOTE — ED Provider Notes (Signed)
[facility] EMERGENCY DEPARTMENT Provider Note   CSN: [id number] Arrival date & time: 02/06/21  1020     History  Chief Complaint  Patient presents with   medical problem    Pt sent by kidney center for blood work, pt states he missed 3 dialysis treatments last week & center requiring pt to get labs prior to treatment. Pt denies complaints @ this time, vitals wdl during triage. A&Ox4.     Glenn Olson is a 81 y.o. male.  HPI Per patient sent in from dialysis center.  Is a Tuesday Thursday Saturday dialysis patient did not go last week.  States he called today about getting in and was told he had to go be evaluated by a doctor so they could dialyze him tomorrow.  Patient states he feels fine.  No shortness of breath.  No swelling.  States he will occasionally miss dialysis but usually has not missed this many days.  No fevers.  No swelling in his legs.  Does not follow his weight at home.  States he just missed dialysis because he had other things to do.    Home Medications Prior to Admission medications   Medication Sig Start Date End Date Taking? Authorizing Provider  aspirin EC 81 MG tablet Take 81 mg by mouth daily.   Yes [provider]  colchicine 0.6 MG tablet Take 0.6 mg by mouth daily as needed (gout/knee flare).   Yes [provider]  lidocaine-prilocaine (EMLA) cream Apply 1 application topically. Apply small amount to access sit (AVT) 1 to 2 hours before Dialysis. Cover with occlusive dressing 11/07/20  Yes [provider]  NIFEdipine (ADALAT CC) 30 MG 24 hr tablet Take 30 mg by mouth daily. 01/15/21  Yes [provider]  omeprazole (PRILOSEC) 20 MG capsule Take 20 mg by mouth daily.   Yes [provider]  simvastatin (ZOCOR) 40 MG tablet Take 40 mg by mouth in the morning.   Yes [provider]      Allergies    Patient has no known allergies.    Review of Systems   Review of Systems  Constitutional:  Negative  for fatigue.  Respiratory:  Negative for chest tightness and shortness of breath.   Cardiovascular:  Negative for chest pain.  Neurological:  Negative for weakness.   Physical Exam Updated Vital Signs BP 126/70 (BP Location: Right Arm)    Pulse (!) 111    Temp 97.8 F (36.6 C) (Oral)    Resp 20    Ht 6' (1.829 m)    Wt 81.6 kg    SpO2 100%    BMI 24.41 kg/m  Physical Exam Nursing note reviewed.  Constitutional:      Appearance: Normal appearance.  HENT:     Head: Atraumatic.  Eyes:     General: No scleral icterus. Cardiovascular:     Rate and Rhythm: Regular rhythm. Tachycardia present.  Pulmonary:     Breath sounds: No wheezing, rhonchi or rales.  Abdominal:     Tenderness: There is no abdominal tenderness.  Musculoskeletal:        General: No tenderness.  Skin:    General: Skin is warm.     Capillary Refill: Capillary refill takes less than 2 seconds.  Neurological:     Mental Status: He is alert and oriented to person, place, and time.    ED Results / Procedures / Treatments   Labs (all labs ordered are listed, but only abnormal results are  displayed) Labs Reviewed  BASIC METABOLIC PANEL - Abnormal; Notable for the following components:      Result Value   CO2 17 (*)    Glucose, Bld 131 (*)    BUN 76 (*)    Creatinine, Ser 7.50 (*)    Calcium 8.4 (*)    GFR, Estimated 7 (*)    All other components within normal limits  CBC - Abnormal; Notable for the following components:   RBC 3.18 (*)    Hemoglobin 9.8 (*)    HCT 30.0 (*)    All other components within normal limits    EKG EKG Interpretation  Date/Time:  Monday February 06 2021 11:11:20 EST Ventricular Rate:  94 PR Interval:  193 QRS Duration: 140 QT Interval:  413 QTC Calculation: 517 R Axis:   -68 Text Interpretation: Sinus rhythm 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 FINDINGS: Cardiomediastinal silhouette is within normal limits and similar. Pulmonary vascular congestion. Mild diffuse interstitial opacities. Multiple nodular opacities in the right midlung. No visible pleural effusions or pneumothorax. IMPRESSION: 1. Pulmonary vascular congestion and mild diffuse interstitial opacities, suggestive of mild interstitial edema versus the sequela of recurrent bouts of CHF. 2. Multiple nodular opacities in the right midlung. Recommend nonurgent chest CT to evaluate for pulmonary nodules. Electronically Signed   By: Margaretha Sheffield M.D.   On: 02/06/2021 11:24    Procedures Procedures    Medications Ordered in ED Medications - No data to display  ED Course/ Medical Decision Making/ A&P                           Medical Decision Making Patient reportedly sent in for medical evaluation after missing dialysis for 3 times.  Tachypnea but not hypoxic.  Patient states he feels fine.  Not hyperkalemic.  Patient eloped before being informed of results. Appears likely stable to be dialyzed tomorrow as he was hoping.  Amount and/or Complexity of Data Reviewed Labs: ordered. Decision-making details documented in ED Course. Radiology: independent interpretation performed. Decision-making details documented in ED Course. ECG/medicine tests: independent interpretation performed. Decision-making details documented in ED Course.           Final Clinical Impression(s) / ED Diagnoses Final diagnoses:  End stage renal disease on dialysis [facility])  Noncompliance of patient with renal dialysis ([place])  Hypervolemia, unspecified hypervolemia type    Rx / DC Orders ED Discharge Orders     None         Davonna Belling, MD 02/06/21 1317

## 2021-02-06 NOTE — ED Notes (Signed)
Patient is not in the room nor his clothes. Patient has cords and EKG stickers on the bed. Patient left before being discharged. Dr. Alvino Chapel notified.

## 2021-02-07 ENCOUNTER — Emergency Department (HOSPITAL_COMMUNITY)
Admission: EM | Admit: 2021-02-07 | Discharge: 2021-02-07 | Disposition: A | Discharge status: Home / Self Care | Payer: Medicare Other | Attending: Student | Admitting: Student

## 2021-02-07 ENCOUNTER — Other Ambulatory Visit: Payer: Self-pay

## 2021-02-07 ENCOUNTER — Encounter (HOSPITAL_COMMUNITY): Payer: Self-pay | Admitting: Emergency Medicine

## 2021-02-07 DIAGNOSIS — I120 Hypertensive chronic kidney disease with stage 5 chronic kidney disease or end stage renal disease: Secondary | ICD-10-CM | POA: Diagnosis not present

## 2021-02-07 DIAGNOSIS — N2581 Secondary hyperparathyroidism of renal origin: Secondary | ICD-10-CM | POA: Diagnosis not present

## 2021-02-07 DIAGNOSIS — Z7982 Long term (current) use of aspirin: Secondary | ICD-10-CM | POA: Insufficient documentation

## 2021-02-07 DIAGNOSIS — E876 Hypokalemia: Secondary | ICD-10-CM | POA: Diagnosis not present

## 2021-02-07 DIAGNOSIS — N186 End stage renal disease: Secondary | ICD-10-CM

## 2021-02-07 DIAGNOSIS — D631 Anemia in chronic kidney disease: Secondary | ICD-10-CM | POA: Diagnosis not present

## 2021-02-07 DIAGNOSIS — Z992 Dependence on renal dialysis: Secondary | ICD-10-CM | POA: Diagnosis not present

## 2021-02-07 NOTE — ED Provider Notes (Signed)
[facility] EMERGENCY DEPARTMENT Provider Note  CSN: [id number] Arrival date & time: 02/07/21 8938  Chief Complaint(s) Clearance for Dialysis  HPI Glenn Olson is a 81 y.o. male with ESRD on hemodialysis who presents emergency department for evaluation of "clearance for dialysis" patient was here in the emergency department for the exact same presentation yesterday but states that he left prior to being discharged because "the doctor did not come back".  On review of his medical chart it appears the patient had only been in the emergency department for 2 hours prior to leaving.  Patient states that because he missed 3 dialysis sessions he requires laboratory evaluation and clearance from emergency department prior to getting dialysis.  He arrives with no symptoms including no chest pain, shortness of breath, Donnell pain, nausea, vomiting or other systemic symptoms.  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 Authorizing Provider  aspirin EC 81 MG tablet Take 81 mg by mouth daily.    [provider]  colchicine 0.6 MG tablet Take 0.6 mg by mouth daily as needed (gout/knee flare).    [provider]  lidocaine-prilocaine (EMLA) cream Apply 1 application topically. Apply small amount to access sit (AVT) 1 to 2 hours before Dialysis. Cover with occlusive dressing 11/07/20   [provider]  NIFEdipine (ADALAT CC) 30 MG 24 hr tablet Take 30 mg by mouth daily. 01/15/21   [provider]  omeprazole  (PRILOSEC) 20 MG capsule Take 20 mg by mouth daily.    [provider]  simvastatin (ZOCOR) 40 MG tablet Take 40 mg by mouth in the morning.    [provider]                                                                                                                                    Past Surgical History Past Surgical History:  Procedure Laterality Date   AV FISTULA PLACEMENT Left 09/27/2020   Procedure: LEFT ARM ARTERIOVENOUS GRAFT PLACEMENT USING GORE-TEX 4-7MM X 45CM  VASCULAR GRAFT;  Surgeon: Rosetta Posner, MD;  Location: [facility];  Service: Vascular;  Laterality: Left;   CYSTOURETHROSCOPY     INSERTION OF DIALYSIS CATHETER Right 09/27/2020   Procedure: INSERTION OF PALINDROME TUNNELED DIALYSIS CATHETER 14.86f X 23CM;  Surgeon: Rosetta Posner, MD;  Location: [facility];  Service: Vascular;  Laterality: Right;   REMOVAL OF A DIALYSIS CATHETER N/A 12/06/2020   Procedure: MINOR  REMOVAL OF A DIALYSIS CATHETER;  Surgeon: Rosetta Posner, MD;  Location: [facility];  Service: Vascular;  Laterality: N/A;   [place]     Family History History reviewed. No pertinent family history.  Social History Social History   Tobacco Use   Smoking status: Never   Smokeless tobacco: Never  Vaping Use   Vaping Use: Never used  Substance Use Topics   Alcohol use: Yes    Comment: patient states he is a social drinker   Drug use: No   Allergies Patient has no known allergies.  Review of Systems Review of Systems  All other systems reviewed and are negative.  Physical Exam Vital Signs  I have reviewed the triage vital signs BP 137/75    Pulse 94    Temp 97.7 F (36.5 C) (Oral)    Resp (!) 22    Ht 6' (1.829 m)    Wt 81.6 kg    SpO2 97%    BMI 24.41 kg/m   Physical Exam Vitals and nursing note reviewed.  Constitutional:      General: He is not in acute distress.    Appearance: He is well-developed.  HENT:     Head: Normocephalic and atraumatic.  Eyes:      Conjunctiva/sclera: Conjunctivae normal.  Cardiovascular:     Rate and Rhythm: Normal rate and regular rhythm.     Heart sounds: No murmur heard. Pulmonary:     Effort: Pulmonary effort is normal. No respiratory distress.     Breath sounds: Normal breath sounds.  Abdominal:     Palpations: Abdomen is soft.     Tenderness: There is no abdominal tenderness.  Musculoskeletal:        General: No swelling.     Cervical back: Neck supple.  Skin:    General: Skin is warm and dry.     Capillary Refill: Capillary refill takes less than 2 seconds.  Neurological:     Mental Status: He is alert.  Psychiatric:        Mood and Affect: Mood normal.    ED Results and Treatments Labs (all labs ordered are listed, but only abnormal results are displayed) Labs Reviewed - No data to display                                                                                                                        Radiology No results found.  Pertinent labs & imaging results that were available during my care of the patient were reviewed by me and considered in my medical decision making (see MDM for details).  Medications Ordered in ED Medications - No data to display  Procedures Procedures  (including critical care time)  Medical Decision Making / ED Course   This patient presents to the ED for concern of need for clearance for dialysis, this involves an extensive number of treatment options, and is a complaint that carries with it a high risk of complications and morbidity.  The differential diagnosis includes electrolyte abnormality, uremia  MDM: Patient seen the emergency department for evaluation of "need for clearance for dialysis".  Physical exam is unremarkable.  Patient had all of his work-up completed yesterday and was safe for dialysis yesterday.  I spoke  directly with the dialysis center and informed them of his work-up yesterday and they state that the patient would be dialyzed today.  Thus, patient discharged and instructed to drive directly to the dialysis center which she states he will do.  Patient then discharged.   Additional history obtained: -External records from outside source obtained and reviewed including: Chart review including previous notes, labs, imaging, consultation notes   Lab Tests: -I ordered, reviewed, and interpreted labs.  The pertinent results include:  none   Medicines ordered and prescription drug management: No orders of the defined types were placed in this encounter.   -Reevaluation of the patient after these medicines showed that the patient stayed the same -I have reviewed the patients home medicines and have made adjustments as needed  Critical interventions none   Cardiac Monitoring: The patient was maintained on a cardiac monitor.  I personally viewed and interpreted the cardiac monitored which showed an underlying rhythm of: Normal sinus rhythm   Reevaluation: After the interventions noted above, I reevaluated the patient and found that they have :stayed the same  Co morbidities that complicate the patient evaluation  Past Medical History:  Diagnosis Date   Anemia    Chronic kidney disease    Dysrhythmia    GERD (gastroesophageal reflux disease)    Neurogenic bladder       Dispostion: Discharge to dialysis     Final Clinical Impression(s) / ED Diagnoses Final diagnoses:  ESRD (end stage renal disease) ([place])     @PCDICTATION @    Teressa Lower, MD 02/07/21 1531

## 2021-02-07 NOTE — ED Triage Notes (Signed)
Pt to the ED requesting clearance for dialysis after missing three treatments.  No complaints of pain.

## 2021-02-09 DIAGNOSIS — E876 Hypokalemia: Secondary | ICD-10-CM | POA: Diagnosis not present

## 2021-02-09 DIAGNOSIS — N186 End stage renal disease: Secondary | ICD-10-CM | POA: Diagnosis not present

## 2021-02-09 DIAGNOSIS — D631 Anemia in chronic kidney disease: Secondary | ICD-10-CM | POA: Diagnosis not present

## 2021-02-09 DIAGNOSIS — N2581 Secondary hyperparathyroidism of renal origin: Secondary | ICD-10-CM | POA: Diagnosis not present

## 2021-02-09 DIAGNOSIS — Z992 Dependence on renal dialysis: Secondary | ICD-10-CM | POA: Diagnosis not present

## 2021-02-10 DIAGNOSIS — N186 End stage renal disease: Secondary | ICD-10-CM | POA: Diagnosis not present

## 2021-02-10 DIAGNOSIS — T82868A Thrombosis of vascular prosthetic devices, implants and grafts, initial encounter: Secondary | ICD-10-CM | POA: Diagnosis not present

## 2021-02-10 DIAGNOSIS — I871 Compression of vein: Secondary | ICD-10-CM | POA: Diagnosis not present

## 2021-02-10 DIAGNOSIS — Z992 Dependence on renal dialysis: Secondary | ICD-10-CM | POA: Diagnosis not present

## 2021-02-11 DIAGNOSIS — Z992 Dependence on renal dialysis: Secondary | ICD-10-CM | POA: Diagnosis not present

## 2021-02-11 DIAGNOSIS — N186 End stage renal disease: Secondary | ICD-10-CM | POA: Diagnosis not present

## 2021-02-11 DIAGNOSIS — N2581 Secondary hyperparathyroidism of renal origin: Secondary | ICD-10-CM | POA: Diagnosis not present

## 2021-02-11 DIAGNOSIS — D631 Anemia in chronic kidney disease: Secondary | ICD-10-CM | POA: Diagnosis not present

## 2021-02-11 DIAGNOSIS — E876 Hypokalemia: Secondary | ICD-10-CM | POA: Diagnosis not present

## 2021-02-14 DIAGNOSIS — N2581 Secondary hyperparathyroidism of renal origin: Secondary | ICD-10-CM | POA: Diagnosis not present

## 2021-02-14 DIAGNOSIS — Z992 Dependence on renal dialysis: Secondary | ICD-10-CM | POA: Diagnosis not present

## 2021-02-14 DIAGNOSIS — N186 End stage renal disease: Secondary | ICD-10-CM | POA: Diagnosis not present

## 2021-02-14 DIAGNOSIS — E876 Hypokalemia: Secondary | ICD-10-CM | POA: Diagnosis not present

## 2021-02-14 DIAGNOSIS — D631 Anemia in chronic kidney disease: Secondary | ICD-10-CM | POA: Diagnosis not present

## 2021-02-15 ENCOUNTER — Encounter: Payer: Medicare Other | Admitting: Vascular Surgery

## 2021-02-16 DIAGNOSIS — E876 Hypokalemia: Secondary | ICD-10-CM | POA: Diagnosis not present

## 2021-02-16 DIAGNOSIS — N186 End stage renal disease: Secondary | ICD-10-CM | POA: Diagnosis not present

## 2021-02-16 DIAGNOSIS — N2581 Secondary hyperparathyroidism of renal origin: Secondary | ICD-10-CM | POA: Diagnosis not present

## 2021-02-16 DIAGNOSIS — Z992 Dependence on renal dialysis: Secondary | ICD-10-CM | POA: Diagnosis not present

## 2021-02-16 DIAGNOSIS — D631 Anemia in chronic kidney disease: Secondary | ICD-10-CM | POA: Diagnosis not present

## 2021-02-18 DIAGNOSIS — E876 Hypokalemia: Secondary | ICD-10-CM | POA: Diagnosis not present

## 2021-02-18 DIAGNOSIS — Z992 Dependence on renal dialysis: Secondary | ICD-10-CM | POA: Diagnosis not present

## 2021-02-18 DIAGNOSIS — D631 Anemia in chronic kidney disease: Secondary | ICD-10-CM | POA: Diagnosis not present

## 2021-02-18 DIAGNOSIS — N2581 Secondary hyperparathyroidism of renal origin: Secondary | ICD-10-CM | POA: Diagnosis not present

## 2021-02-18 DIAGNOSIS — N186 End stage renal disease: Secondary | ICD-10-CM | POA: Diagnosis not present

## 2021-02-20 ENCOUNTER — Other Ambulatory Visit: Payer: Self-pay

## 2021-02-20 NOTE — Progress Notes (Signed)
Error

## 2021-02-21 DIAGNOSIS — E876 Hypokalemia: Secondary | ICD-10-CM | POA: Diagnosis not present

## 2021-02-21 DIAGNOSIS — N186 End stage renal disease: Secondary | ICD-10-CM | POA: Diagnosis not present

## 2021-02-21 DIAGNOSIS — N2581 Secondary hyperparathyroidism of renal origin: Secondary | ICD-10-CM | POA: Diagnosis not present

## 2021-02-21 DIAGNOSIS — Z992 Dependence on renal dialysis: Secondary | ICD-10-CM | POA: Diagnosis not present

## 2021-02-21 DIAGNOSIS — D631 Anemia in chronic kidney disease: Secondary | ICD-10-CM | POA: Diagnosis not present

## 2021-02-22 ENCOUNTER — Ambulatory Visit (INDEPENDENT_AMBULATORY_CARE_PROVIDER_SITE_OTHER): Payer: Medicare Other | Admitting: Vascular Surgery

## 2021-02-22 ENCOUNTER — Other Ambulatory Visit: Payer: Self-pay

## 2021-02-22 ENCOUNTER — Encounter: Payer: Self-pay | Admitting: Vascular Surgery

## 2021-02-22 VITALS — BP 92/55 | HR 103 | Temp 98.1°F | Resp 20 | Ht 72.0 in | Wt 171.0 lb

## 2021-02-22 DIAGNOSIS — N186 End stage renal disease: Secondary | ICD-10-CM

## 2021-02-22 NOTE — H&P (View-Only) (Signed)
Patient ID: Glenn Olson, male   DOB: May 14, 1940, 81 y.o.   MRN: [id number]  Reason for Consult: New Patient (Initial Visit)   Referred by Celene Squibb, MD  Subjective:     HPI:  Glenn Olson is a 81 y.o. male with end-stage renal disease currently on dialysis via tunneled dialysis catheter.  He has a previous left forearm AV graft which is functional but has undergone multiple procedures most recently a week ago and he has significant bruising from this which causes him pain.  He is here now to discuss peritoneal dialysis.  He has never had peritoneal dialysis.  He does have a suprapubic catheter in place.  He has had a midline laparotomy in the past for colon resection this was about 10 years ago.  No other abdominal surgeries.  No current abdominal pain.  Past Medical History:  Diagnosis Date   Anemia    Chronic kidney disease    Dysrhythmia    GERD (gastroesophageal reflux disease)    Neurogenic bladder    History reviewed. No pertinent family history. Past Surgical History:  Procedure Laterality Date   AV FISTULA PLACEMENT Left 09/27/2020   Procedure: LEFT ARM ARTERIOVENOUS GRAFT PLACEMENT USING GORE-TEX 4-7MM X 45CM  VASCULAR GRAFT;  Surgeon: Rosetta Posner, MD;  Location: [facility];  Service: Vascular;  Laterality: Left;   CYSTOURETHROSCOPY     INSERTION OF DIALYSIS CATHETER Right 09/27/2020   Procedure: INSERTION OF PALINDROME TUNNELED DIALYSIS CATHETER 14.65f X 23CM;  Surgeon: Rosetta Posner, MD;  Location: [facility];  Service: Vascular;  Laterality: Right;   REMOVAL OF A DIALYSIS CATHETER N/A 12/06/2020   Procedure: MINOR REMOVAL OF A DIALYSIS CATHETER;  Surgeon: Rosetta Posner, MD;  Location: [facility];  Service: Vascular;  Laterality: N/A;   SUPRAPUBIC CATHETER PLACEMENT      Short Social History:  Social History   Tobacco Use   Smoking status: Never   Smokeless tobacco: Never  Substance Use Topics   Alcohol use: Yes    Comment: patient states he is a social drinker     No Known Allergies  Current Outpatient Medications  Medication Sig Dispense Refill   aspirin EC 81 MG tablet Take 81 mg by mouth daily.     colchicine 0.6 MG tablet Take 0.6 mg by mouth daily as needed (gout/knee flare).     lidocaine-prilocaine (EMLA) cream Apply 1 application topically. Apply small amount to access sit (AVT) 1 to 2 hours before Dialysis. Cover with occlusive dressing     NIFEdipine (ADALAT CC) 30 MG 24 hr tablet Take 30 mg by mouth daily.     omeprazole (PRILOSEC) 20 MG capsule Take 20 mg by mouth daily.     simvastatin (ZOCOR) 40 MG tablet Take 40 mg by mouth in the morning.     No current facility-administered medications for this visit.    Review of Systems  Constitutional:  Constitutional negative. HENT: HENT negative.  Eyes: Eyes negative.  Cardiovascular: Cardiovascular negative.  GI: Gastrointestinal negative.  GU:       Suprapubic catheter Musculoskeletal: Musculoskeletal negative.  Skin: Skin negative.  Neurological: Neurological negative. Hematologic: Positive for bruises/bleeds easily.       Objective:  Objective   Vitals:   02/22/21 1125  BP: (!) 92/55  Pulse: (!) 103  Resp: 20  Temp: 98.1 F (36.7 C)  SpO2: 94%  Weight: 171 lb (77.6 kg)  Height: 6' (1.829 m)   Body mass index is 23.19 kg/m.  Physical Exam HENT:     Head: Normocephalic.     Nose:     Comments: Wearing a mask Eyes:     Pupils: Pupils are equal, round, and reactive to light.  Cardiovascular:     Rate and Rhythm: Normal rate.  Pulmonary:     Effort: Pulmonary effort is normal.  Abdominal:     General: Abdomen is flat.     Comments: Suprapubic catheter in place Well-healed midline abdominal incision without hernia  Musculoskeletal:     Right lower leg: No edema.     Left lower leg: No edema.     Comments: Left forearm AV graft with pulsatility  Skin:    General: Skin is warm.     Capillary Refill: Capillary refill takes less than 2 seconds.   Neurological:     General: No focal deficit present.     Mental Status: He is alert.  Psychiatric:        Mood and Affect: Mood normal.        Behavior: Behavior normal.        Assessment/Plan:    81 year old male with end-stage renal disease currently dialyzing via catheter.  He has a left forearm AV graft which is patent although has not been usable for dialysis.  I discussed fistulogram with possible intervention to salvage the graft but he is not interested and would rather stay with the catheter at this time.  He is hopeful for peritoneal dialysis.  I discussed with him that given his previous abdominal instrumentation that he is at high risk for nonfunction of the catheter and even worse is at risk for injury to intra-abdominal contents on evaluation which may require large surgery to repair including with our general surgery colleagues.  Patient is adamant that we attempt peritoneal dialysis and understands that it may not work in the other above mentioned complications.  We will get him scheduled on a nondialysis day in the near future.    Waynetta Sandy MD [facility]

## 2021-02-22 NOTE — Progress Notes (Signed)
Patient ID: Glenn Olson, male   DOB: 1940-03-16, 81 y.o.   MRN: [id number]  Reason for Consult: New Patient (Initial Visit)   Referred by Celene Squibb, MD  Subjective:     HPI:  Glenn Olson is a 81 y.o. male with end-stage renal disease currently on dialysis via tunneled dialysis catheter.  He has a previous left forearm AV graft which is functional but has undergone multiple procedures most recently a week ago and he has significant bruising from this which causes him pain.  He is here now to discuss peritoneal dialysis.  He has never had peritoneal dialysis.  He does have a suprapubic catheter in place.  He has had a midline laparotomy in the past for colon resection this was about 10 years ago.  No other abdominal surgeries.  No current abdominal pain.  Past Medical History:  Diagnosis Date   Anemia    Chronic kidney disease    Dysrhythmia    GERD (gastroesophageal reflux disease)    Neurogenic bladder    History reviewed. No pertinent family history. Past Surgical History:  Procedure Laterality Date   AV FISTULA PLACEMENT Left 09/27/2020   Procedure: LEFT ARM ARTERIOVENOUS GRAFT PLACEMENT USING GORE-TEX 4-7MM X 45CM  VASCULAR GRAFT;  Surgeon: Rosetta Posner, MD;  Location: [facility];  Service: Vascular;  Laterality: Left;   CYSTOURETHROSCOPY     INSERTION OF DIALYSIS CATHETER Right 09/27/2020   Procedure: INSERTION OF PALINDROME TUNNELED DIALYSIS CATHETER 14.53f X 23CM;  Surgeon: Rosetta Posner, MD;  Location: [facility];  Service: Vascular;  Laterality: Right;   REMOVAL OF A DIALYSIS CATHETER N/A 12/06/2020   Procedure: MINOR REMOVAL OF A DIALYSIS CATHETER;  Surgeon: Rosetta Posner, MD;  Location: [facility];  Service: Vascular;  Laterality: N/A;   SUPRAPUBIC CATHETER PLACEMENT      Short Social History:  Social History   Tobacco Use   Smoking status: Never   Smokeless tobacco: Never  Substance Use Topics   Alcohol use: Yes    Comment: patient states he is a social drinker     No Known Allergies  Current Outpatient Medications  Medication Sig Dispense Refill   aspirin EC 81 MG tablet Take 81 mg by mouth daily.     colchicine 0.6 MG tablet Take 0.6 mg by mouth daily as needed (gout/knee flare).     lidocaine-prilocaine (EMLA) cream Apply 1 application topically. Apply small amount to access sit (AVT) 1 to 2 hours before Dialysis. Cover with occlusive dressing     NIFEdipine (ADALAT CC) 30 MG 24 hr tablet Take 30 mg by mouth daily.     omeprazole (PRILOSEC) 20 MG capsule Take 20 mg by mouth daily.     simvastatin (ZOCOR) 40 MG tablet Take 40 mg by mouth in the morning.     No current facility-administered medications for this visit.    Review of Systems  Constitutional:  Constitutional negative. HENT: HENT negative.  Eyes: Eyes negative.  Cardiovascular: Cardiovascular negative.  GI: Gastrointestinal negative.  GU:       Suprapubic catheter Musculoskeletal: Musculoskeletal negative.  Skin: Skin negative.  Neurological: Neurological negative. Hematologic: Positive for bruises/bleeds easily.       Objective:  Objective   Vitals:   02/22/21 1125  BP: (!) 92/55  Pulse: (!) 103  Resp: 20  Temp: 98.1 F (36.7 C)  SpO2: 94%  Weight: 171 lb (77.6 kg)  Height: 6' (1.829 m)   Body mass index is 23.19 kg/m.  Physical Exam HENT:     Head: Normocephalic.     Nose:     Comments: Wearing a mask Eyes:     Pupils: Pupils are equal, round, and reactive to light.  Cardiovascular:     Rate and Rhythm: Normal rate.  Pulmonary:     Effort: Pulmonary effort is normal.  Abdominal:     General: Abdomen is flat.     Comments: Suprapubic catheter in place Well-healed midline abdominal incision without hernia  Musculoskeletal:     Right lower leg: No edema.     Left lower leg: No edema.     Comments: Left forearm AV graft with pulsatility  Skin:    General: Skin is warm.     Capillary Refill: Capillary refill takes less than 2 seconds.   Neurological:     General: No focal deficit present.     Mental Status: He is alert.  Psychiatric:        Mood and Affect: Mood normal.        Behavior: Behavior normal.        Assessment/Plan:    81 year old male with end-stage renal disease currently dialyzing via catheter.  He has a left forearm AV graft which is patent although has not been usable for dialysis.  I discussed fistulogram with possible intervention to salvage the graft but he is not interested and would rather stay with the catheter at this time.  He is hopeful for peritoneal dialysis.  I discussed with him that given his previous abdominal instrumentation that he is at high risk for nonfunction of the catheter and even worse is at risk for injury to intra-abdominal contents on evaluation which may require large surgery to repair including with our general surgery colleagues.  Patient is adamant that we attempt peritoneal dialysis and understands that it may not work in the other above mentioned complications.  We will get him scheduled on a nondialysis day in the near future.    Waynetta Sandy MD [facility]

## 2021-02-23 DIAGNOSIS — E876 Hypokalemia: Secondary | ICD-10-CM | POA: Diagnosis not present

## 2021-02-23 DIAGNOSIS — N186 End stage renal disease: Secondary | ICD-10-CM | POA: Diagnosis not present

## 2021-02-23 DIAGNOSIS — N2581 Secondary hyperparathyroidism of renal origin: Secondary | ICD-10-CM | POA: Diagnosis not present

## 2021-02-23 DIAGNOSIS — D631 Anemia in chronic kidney disease: Secondary | ICD-10-CM | POA: Diagnosis not present

## 2021-02-23 DIAGNOSIS — Z992 Dependence on renal dialysis: Secondary | ICD-10-CM | POA: Diagnosis not present

## 2021-02-25 DIAGNOSIS — Z992 Dependence on renal dialysis: Secondary | ICD-10-CM | POA: Diagnosis not present

## 2021-02-25 DIAGNOSIS — N2581 Secondary hyperparathyroidism of renal origin: Secondary | ICD-10-CM | POA: Diagnosis not present

## 2021-02-25 DIAGNOSIS — D631 Anemia in chronic kidney disease: Secondary | ICD-10-CM | POA: Diagnosis not present

## 2021-02-25 DIAGNOSIS — N186 End stage renal disease: Secondary | ICD-10-CM | POA: Diagnosis not present

## 2021-02-25 DIAGNOSIS — E876 Hypokalemia: Secondary | ICD-10-CM | POA: Diagnosis not present

## 2021-02-28 DIAGNOSIS — Z992 Dependence on renal dialysis: Secondary | ICD-10-CM | POA: Diagnosis not present

## 2021-02-28 DIAGNOSIS — D631 Anemia in chronic kidney disease: Secondary | ICD-10-CM | POA: Diagnosis not present

## 2021-02-28 DIAGNOSIS — E876 Hypokalemia: Secondary | ICD-10-CM | POA: Diagnosis not present

## 2021-02-28 DIAGNOSIS — N2581 Secondary hyperparathyroidism of renal origin: Secondary | ICD-10-CM | POA: Diagnosis not present

## 2021-02-28 DIAGNOSIS — N186 End stage renal disease: Secondary | ICD-10-CM | POA: Diagnosis not present

## 2021-03-01 DIAGNOSIS — I129 Hypertensive chronic kidney disease with stage 1 through stage 4 chronic kidney disease, or unspecified chronic kidney disease: Secondary | ICD-10-CM | POA: Diagnosis not present

## 2021-03-01 DIAGNOSIS — Z992 Dependence on renal dialysis: Secondary | ICD-10-CM | POA: Diagnosis not present

## 2021-03-01 DIAGNOSIS — N186 End stage renal disease: Secondary | ICD-10-CM | POA: Diagnosis not present

## 2021-03-02 DIAGNOSIS — Z23 Encounter for immunization: Secondary | ICD-10-CM | POA: Diagnosis not present

## 2021-03-02 DIAGNOSIS — E876 Hypokalemia: Secondary | ICD-10-CM | POA: Diagnosis not present

## 2021-03-02 DIAGNOSIS — Z992 Dependence on renal dialysis: Secondary | ICD-10-CM | POA: Diagnosis not present

## 2021-03-02 DIAGNOSIS — D631 Anemia in chronic kidney disease: Secondary | ICD-10-CM | POA: Diagnosis not present

## 2021-03-02 DIAGNOSIS — N186 End stage renal disease: Secondary | ICD-10-CM | POA: Diagnosis not present

## 2021-03-02 DIAGNOSIS — N2581 Secondary hyperparathyroidism of renal origin: Secondary | ICD-10-CM | POA: Diagnosis not present

## 2021-03-04 DIAGNOSIS — E876 Hypokalemia: Secondary | ICD-10-CM | POA: Diagnosis not present

## 2021-03-04 DIAGNOSIS — N186 End stage renal disease: Secondary | ICD-10-CM | POA: Diagnosis not present

## 2021-03-04 DIAGNOSIS — Z23 Encounter for immunization: Secondary | ICD-10-CM | POA: Diagnosis not present

## 2021-03-04 DIAGNOSIS — Z992 Dependence on renal dialysis: Secondary | ICD-10-CM | POA: Diagnosis not present

## 2021-03-04 DIAGNOSIS — D631 Anemia in chronic kidney disease: Secondary | ICD-10-CM | POA: Diagnosis not present

## 2021-03-04 DIAGNOSIS — N2581 Secondary hyperparathyroidism of renal origin: Secondary | ICD-10-CM | POA: Diagnosis not present

## 2021-03-07 ENCOUNTER — Encounter (HOSPITAL_COMMUNITY): Payer: Self-pay | Admitting: Radiology

## 2021-03-07 DIAGNOSIS — Z992 Dependence on renal dialysis: Secondary | ICD-10-CM | POA: Diagnosis not present

## 2021-03-07 DIAGNOSIS — Z23 Encounter for immunization: Secondary | ICD-10-CM | POA: Diagnosis not present

## 2021-03-07 DIAGNOSIS — N2581 Secondary hyperparathyroidism of renal origin: Secondary | ICD-10-CM | POA: Diagnosis not present

## 2021-03-07 DIAGNOSIS — D631 Anemia in chronic kidney disease: Secondary | ICD-10-CM | POA: Diagnosis not present

## 2021-03-07 DIAGNOSIS — N186 End stage renal disease: Secondary | ICD-10-CM | POA: Diagnosis not present

## 2021-03-07 DIAGNOSIS — E876 Hypokalemia: Secondary | ICD-10-CM | POA: Diagnosis not present

## 2021-03-09 DIAGNOSIS — Z23 Encounter for immunization: Secondary | ICD-10-CM | POA: Diagnosis not present

## 2021-03-09 DIAGNOSIS — N2581 Secondary hyperparathyroidism of renal origin: Secondary | ICD-10-CM | POA: Diagnosis not present

## 2021-03-09 DIAGNOSIS — N186 End stage renal disease: Secondary | ICD-10-CM | POA: Diagnosis not present

## 2021-03-09 DIAGNOSIS — D631 Anemia in chronic kidney disease: Secondary | ICD-10-CM | POA: Diagnosis not present

## 2021-03-09 DIAGNOSIS — Z992 Dependence on renal dialysis: Secondary | ICD-10-CM | POA: Diagnosis not present

## 2021-03-09 DIAGNOSIS — E876 Hypokalemia: Secondary | ICD-10-CM | POA: Diagnosis not present

## 2021-03-11 DIAGNOSIS — E876 Hypokalemia: Secondary | ICD-10-CM | POA: Diagnosis not present

## 2021-03-11 DIAGNOSIS — D631 Anemia in chronic kidney disease: Secondary | ICD-10-CM | POA: Diagnosis not present

## 2021-03-11 DIAGNOSIS — Z992 Dependence on renal dialysis: Secondary | ICD-10-CM | POA: Diagnosis not present

## 2021-03-11 DIAGNOSIS — Z23 Encounter for immunization: Secondary | ICD-10-CM | POA: Diagnosis not present

## 2021-03-11 DIAGNOSIS — N186 End stage renal disease: Secondary | ICD-10-CM | POA: Diagnosis not present

## 2021-03-11 DIAGNOSIS — N2581 Secondary hyperparathyroidism of renal origin: Secondary | ICD-10-CM | POA: Diagnosis not present

## 2021-03-14 DIAGNOSIS — D631 Anemia in chronic kidney disease: Secondary | ICD-10-CM | POA: Diagnosis not present

## 2021-03-14 DIAGNOSIS — Z23 Encounter for immunization: Secondary | ICD-10-CM | POA: Diagnosis not present

## 2021-03-14 DIAGNOSIS — N186 End stage renal disease: Secondary | ICD-10-CM | POA: Diagnosis not present

## 2021-03-14 DIAGNOSIS — Z992 Dependence on renal dialysis: Secondary | ICD-10-CM | POA: Diagnosis not present

## 2021-03-14 DIAGNOSIS — E876 Hypokalemia: Secondary | ICD-10-CM | POA: Diagnosis not present

## 2021-03-14 DIAGNOSIS — N2581 Secondary hyperparathyroidism of renal origin: Secondary | ICD-10-CM | POA: Diagnosis not present

## 2021-03-16 ENCOUNTER — Other Ambulatory Visit: Payer: Self-pay

## 2021-03-16 ENCOUNTER — Encounter (HOSPITAL_COMMUNITY): Payer: Self-pay | Admitting: Vascular Surgery

## 2021-03-16 DIAGNOSIS — N2581 Secondary hyperparathyroidism of renal origin: Secondary | ICD-10-CM | POA: Diagnosis not present

## 2021-03-16 DIAGNOSIS — E876 Hypokalemia: Secondary | ICD-10-CM | POA: Diagnosis not present

## 2021-03-16 DIAGNOSIS — Z23 Encounter for immunization: Secondary | ICD-10-CM | POA: Diagnosis not present

## 2021-03-16 DIAGNOSIS — D631 Anemia in chronic kidney disease: Secondary | ICD-10-CM | POA: Diagnosis not present

## 2021-03-16 DIAGNOSIS — Z992 Dependence on renal dialysis: Secondary | ICD-10-CM | POA: Diagnosis not present

## 2021-03-16 DIAGNOSIS — N186 End stage renal disease: Secondary | ICD-10-CM | POA: Diagnosis not present

## 2021-03-16 NOTE — Anesthesia Preprocedure Evaluation (Addendum)
Anesthesia Evaluation  Patient identified by MRN, date of birth, ID band Patient awake    Reviewed: Allergy & Precautions, NPO status , Patient's Chart, lab work & pertinent test results  Airway Mallampati: III  TM Distance: >3 FB Neck ROM: Full    Dental  (+) Edentulous Upper, Edentulous Lower   Pulmonary neg pulmonary ROS,    Pulmonary exam normal breath sounds clear to auscultation       Cardiovascular Normal cardiovascular exam+ dysrhythmias  Rhythm:Regular Rate:Normal     Neuro/Psych negative neurological ROS     GI/Hepatic Neg liver ROS, GERD  ,  Endo/Other  negative endocrine ROS  Renal/GU Renal disease     Musculoskeletal negative musculoskeletal ROS (+)   Abdominal   Peds  Hematology  (+) Blood dyscrasia, anemia ,   Anesthesia Other Findings   Reproductive/Obstetrics                            Anesthesia Physical Anesthesia Plan  ASA: 3  Anesthesia Plan: General   Post-op Pain Management: Tylenol PO (pre-op)* and Ketamine IV*   Induction: Intravenous  PONV Risk Score and Plan: 4 or greater and Dexamethasone, Ondansetron, Treatment may vary due to age or medical condition and Diphenhydramine  Airway Management Planned: Oral ETT  Additional Equipment: None  Intra-op Plan:   Post-operative Plan: Extubation in OR  Informed Consent: I have reviewed the patients History and Physical, chart, labs and discussed the procedure including the risks, benefits and alternatives for the proposed anesthesia with the patient or authorized representative who has indicated his/her understanding and acceptance.     Dental advisory given  Plan Discussed with: CRNA  Anesthesia Plan Comments:        Anesthesia Quick Evaluation

## 2021-03-16 NOTE — Progress Notes (Signed)
PCP - Dr. Nevada Crane Cardiologist -  EKG - 1/10 Chest x-ray - 1/9 ECHO -  Cardiac Cath -  CPAP -   Aspirin Instructions: Follow your surgeon's instructions on when to stop Aspirin.  If no instructions were given by your surgeon then you will need to call the office to get those instructions.    ERAS Protcol -  COVID TEST- n/a  Anesthesia review: n/a  -------------  SDW INSTRUCTIONS:  Your procedure is scheduled on Friday 2/17. Please report to [facility] Main Entrance "A" at 0530 A.M., and check in at the Admitting office. Call this number if you have problems the morning of surgery: [phone]   Remember: Do not eat or drink after midnight the night before your surgery   Medications to take morning of surgery with a sip of water include: omeprazole (PRILOSEC)  simvastatin (ZOCOR)   Follow your surgeon's instructions on when to stop Aspirin.  If no instructions were given by your surgeon then you will need to call the office to get those instructions.    As of today, STOP taking any Aleve, Naproxen, Ibuprofen, Motrin, Advil, Goody's, BC's, all herbal medications, fish oil, and all vitamins.    The Morning of Surgery Do not wear jewelry Do not wear lotions, powders, or perfumes/colognes, or deodorant Do not bring valuables to the hospital. [facility] is not responsible for any belongings or valuables.  If you are a smoker, DO NOT Smoke 24 hours prior to surgery  If you wear a CPAP at night please bring your mask the morning of surgery   Remember that you must have someone to transport you home after your surgery, and remain with you for 24 hours if you are discharged the same day.  Please bring cases for contacts, glasses, hearing aids, dentures or bridgework because it cannot be worn into surgery.   Patients discharged the day of surgery will not be allowed to drive home.   Please shower the NIGHT BEFORE/MORNING OF SURGERY (use antibacterial soap like DIAL soap if  possible). Wear comfortable clothes the morning of surgery. Oral Hygiene is also important to reduce your risk of infection.  Remember - BRUSH YOUR TEETH THE MORNING OF SURGERY WITH YOUR REGULAR TOOTHPASTE  Patient denies shortness of breath, fever, cough and chest pain.

## 2021-03-17 ENCOUNTER — Encounter (HOSPITAL_COMMUNITY): Payer: Self-pay | Admitting: Vascular Surgery

## 2021-03-17 ENCOUNTER — Ambulatory Visit (HOSPITAL_COMMUNITY): Payer: Medicare Other | Admitting: Anesthesiology

## 2021-03-17 ENCOUNTER — Other Ambulatory Visit: Payer: Self-pay

## 2021-03-17 ENCOUNTER — Encounter (HOSPITAL_COMMUNITY)
Admission: RE | Disposition: A | Discharge status: Home / Self Care | Payer: Self-pay | Source: Home / Self Care | Attending: Vascular Surgery

## 2021-03-17 ENCOUNTER — Ambulatory Visit (HOSPITAL_COMMUNITY)
Admission: RE | Admit: 2021-03-17 | Discharge: 2021-03-17 | Disposition: A | Discharge status: Home / Self Care | Payer: Medicare Other | Attending: Vascular Surgery | Admitting: Vascular Surgery

## 2021-03-17 ENCOUNTER — Ambulatory Visit (HOSPITAL_BASED_OUTPATIENT_CLINIC_OR_DEPARTMENT_OTHER): Payer: Medicare Other | Admitting: Anesthesiology

## 2021-03-17 DIAGNOSIS — D631 Anemia in chronic kidney disease: Secondary | ICD-10-CM | POA: Diagnosis not present

## 2021-03-17 DIAGNOSIS — Z992 Dependence on renal dialysis: Secondary | ICD-10-CM

## 2021-03-17 DIAGNOSIS — K660 Peritoneal adhesions (postprocedural) (postinfection): Secondary | ICD-10-CM | POA: Insufficient documentation

## 2021-03-17 DIAGNOSIS — K219 Gastro-esophageal reflux disease without esophagitis: Secondary | ICD-10-CM | POA: Insufficient documentation

## 2021-03-17 DIAGNOSIS — N186 End stage renal disease: Secondary | ICD-10-CM

## 2021-03-17 HISTORY — PX: CAPD INSERTION: SHX5233

## 2021-03-17 LAB — POCT I-STAT, CHEM 8
BUN: 18 mg/dL (ref 8–23)
Calcium, Ion: 1.11 mmol/L — ABNORMAL LOW (ref 1.15–1.40)
Chloride: 100 mmol/L (ref 98–111)
Creatinine, Ser: 4.5 mg/dL — ABNORMAL HIGH (ref 0.61–1.24)
Glucose, Bld: 114 mg/dL — ABNORMAL HIGH (ref 70–99)
HCT: 33 % — ABNORMAL LOW (ref 39.0–52.0)
Hemoglobin: 11.2 g/dL — ABNORMAL LOW (ref 13.0–17.0)
Potassium: 3.5 mmol/L (ref 3.5–5.1)
Sodium: 138 mmol/L (ref 135–145)
TCO2: 26 mmol/L (ref 22–32)

## 2021-03-17 SURGERY — LAPAROSCOPIC INSERTION CONTINUOUS AMBULATORY PERITONEAL DIALYSIS  (CAPD) CATHETER
Anesthesia: General | Site: Abdomen

## 2021-03-17 MED ORDER — ONDANSETRON HCL 4 MG/2ML IJ SOLN
INTRAMUSCULAR | Status: DC | PRN
Start: 2021-03-17 — End: 2021-03-17
  Administered 2021-03-17: 4 mg via INTRAVENOUS

## 2021-03-17 MED ORDER — PHENYLEPHRINE HCL-NACL 20-0.9 MG/250ML-% IV SOLN
INTRAVENOUS | Status: DC | PRN
  Administered 2021-03-17: 20 ug/min via INTRAVENOUS

## 2021-03-17 MED ORDER — CHLORHEXIDINE GLUCONATE 0.12 % MT SOLN
15.0000 mL | Freq: Once | OROMUCOSAL | Status: AC
  Administered 2021-03-17: 15 mL via OROMUCOSAL
  Filled 2021-03-17: qty 15

## 2021-03-17 MED ORDER — BUPIVACAINE HCL (PF) 0.25 % IJ SOLN
INTRAMUSCULAR | Status: DC | PRN
  Administered 2021-03-17: 30 mL

## 2021-03-17 MED ORDER — CEFAZOLIN SODIUM-DEXTROSE 2-4 GM/100ML-% IV SOLN
2.0000 g | INTRAVENOUS | Status: AC
  Administered 2021-03-17: 2 g via INTRAVENOUS
  Filled 2021-03-17: qty 100

## 2021-03-17 MED ORDER — CHLORHEXIDINE GLUCONATE 4 % EX LIQD: 60.0000 mL | Freq: Once | CUTANEOUS | Status: DC

## 2021-03-17 MED ORDER — SODIUM CHLORIDE 0.9 % IV SOLN: INTRAVENOUS | Status: DC

## 2021-03-17 MED ORDER — ROCURONIUM BROMIDE 10 MG/ML (PF) SYRINGE
PREFILLED_SYRINGE | INTRAVENOUS | Status: DC | PRN
  Administered 2021-03-17: 30 mg via INTRAVENOUS
  Administered 2021-03-17: 50 mg via INTRAVENOUS

## 2021-03-17 MED ORDER — DEXAMETHASONE SODIUM PHOSPHATE 10 MG/ML IJ SOLN
INTRAMUSCULAR | Status: AC
  Filled 2021-03-17: qty 1

## 2021-03-17 MED ORDER — LIDOCAINE 2% (20 MG/ML) 5 ML SYRINGE
INTRAMUSCULAR | Status: DC | PRN
  Administered 2021-03-17: 60 mg via INTRAVENOUS

## 2021-03-17 MED ORDER — SUGAMMADEX SODIUM 200 MG/2ML IV SOLN
INTRAVENOUS | Status: DC | PRN
Start: 2021-03-17 — End: 2021-03-17
  Administered 2021-03-17: 400 mg via INTRAVENOUS

## 2021-03-17 MED ORDER — HEPARIN 6000 UNIT IRRIGATION SOLUTION
Status: DC | PRN
  Administered 2021-03-17: 1

## 2021-03-17 MED ORDER — PHENYLEPHRINE 40 MCG/ML (10ML) SYRINGE FOR IV PUSH (FOR BLOOD PRESSURE SUPPORT)
PREFILLED_SYRINGE | INTRAVENOUS | Status: AC
  Filled 2021-03-17: qty 10

## 2021-03-17 MED ORDER — FENTANYL CITRATE (PF) 250 MCG/5ML IJ SOLN
INTRAMUSCULAR | Status: AC
  Filled 2021-03-17: qty 5

## 2021-03-17 MED ORDER — ROCURONIUM BROMIDE 10 MG/ML (PF) SYRINGE
PREFILLED_SYRINGE | INTRAVENOUS | Status: AC
  Filled 2021-03-17: qty 10

## 2021-03-17 MED ORDER — FENTANYL CITRATE (PF) 250 MCG/5ML IJ SOLN
INTRAMUSCULAR | Status: DC | PRN
  Administered 2021-03-17 (×2): 25 ug via INTRAVENOUS
  Administered 2021-03-17: 100 ug via INTRAVENOUS

## 2021-03-17 MED ORDER — BUPIVACAINE HCL (PF) 0.25 % IJ SOLN
INTRAMUSCULAR | Status: AC
  Filled 2021-03-17: qty 30

## 2021-03-17 MED ORDER — LIDOCAINE 2% (20 MG/ML) 5 ML SYRINGE
INTRAMUSCULAR | Status: AC
  Filled 2021-03-17: qty 5

## 2021-03-17 MED ORDER — HEPARIN 6000 UNIT IRRIGATION SOLUTION
Status: AC
  Filled 2021-03-17: qty 500

## 2021-03-17 MED ORDER — 0.9 % SODIUM CHLORIDE (POUR BTL) OPTIME
TOPICAL | Status: DC | PRN
Start: 2021-03-17 — End: 2021-03-17
  Administered 2021-03-17: 1000 mL

## 2021-03-17 MED ORDER — OXYCODONE-ACETAMINOPHEN 5-325 MG PO TABS: 1.0000 | ORAL_TABLET | Freq: Four times a day (QID) | ORAL | 0 refills | Status: DC | PRN

## 2021-03-17 MED ORDER — ONDANSETRON HCL 4 MG/2ML IJ SOLN
INTRAMUSCULAR | Status: AC
  Filled 2021-03-17: qty 2

## 2021-03-17 MED ORDER — DEXAMETHASONE SODIUM PHOSPHATE 10 MG/ML IJ SOLN
INTRAMUSCULAR | Status: DC | PRN
  Administered 2021-03-17: 5 mg via INTRAVENOUS

## 2021-03-17 MED ORDER — PHENYLEPHRINE 40 MCG/ML (10ML) SYRINGE FOR IV PUSH (FOR BLOOD PRESSURE SUPPORT)
PREFILLED_SYRINGE | INTRAVENOUS | Status: DC | PRN
Start: 2021-03-17 — End: 2021-03-17
  Administered 2021-03-17: 40 ug via INTRAVENOUS
  Administered 2021-03-17: 80 ug via INTRAVENOUS

## 2021-03-17 MED ORDER — ACETAMINOPHEN 500 MG PO TABS
1000.0000 mg | ORAL_TABLET | Freq: Once | ORAL | Status: AC
  Administered 2021-03-17: 1000 mg via ORAL
  Filled 2021-03-17: qty 2

## 2021-03-17 MED ORDER — PROPOFOL 10 MG/ML IV BOLUS
INTRAVENOUS | Status: DC | PRN
Start: 2021-03-17 — End: 2021-03-17
  Administered 2021-03-17: 90 mg via INTRAVENOUS

## 2021-03-17 MED ORDER — PROPOFOL 10 MG/ML IV BOLUS
INTRAVENOUS | Status: AC
  Filled 2021-03-17: qty 20

## 2021-03-17 MED ORDER — ORAL CARE MOUTH RINSE: 15.0000 mL | Freq: Once | OROMUCOSAL | Status: AC

## 2021-03-17 SURGICAL SUPPLY — 49 items
ADAPTER TITANIUM MEDIONICS (MISCELLANEOUS) ×2 IMPLANT
APPLIER CLIP 5 13 M/L LIGAMAX5 (MISCELLANEOUS)
BAG DECANTER FOR FLEXI CONT (MISCELLANEOUS) ×2 IMPLANT
BLADE 11 SAFETY STRL DISP (BLADE) ×2 IMPLANT
BLADE CLIPPER SURG (BLADE) IMPLANT
CATH EXTENDED DIALYSIS (CATHETERS) IMPLANT
CHLORAPREP W/TINT 26 (MISCELLANEOUS) ×2 IMPLANT
CLIP APPLIE 5 13 M/L LIGAMAX5 (MISCELLANEOUS) IMPLANT
COVER SURGICAL LIGHT HANDLE (MISCELLANEOUS) ×2 IMPLANT
DECANTER SPIKE VIAL GLASS SM (MISCELLANEOUS) ×2 IMPLANT
DERMABOND ADVANCED (GAUZE/BANDAGES/DRESSINGS) ×1
DERMABOND ADVANCED .7 DNX12 (GAUZE/BANDAGES/DRESSINGS) ×1 IMPLANT
DEVICE TROCAR PUNCTURE CLOSURE (ENDOMECHANICALS) ×2 IMPLANT
DRSG TEGADERM 4X4.75 (GAUZE/BANDAGES/DRESSINGS) ×3 IMPLANT
ELECT REM PT RETURN 9FT ADLT (ELECTROSURGICAL) ×2
ELECTRODE REM PT RTRN 9FT ADLT (ELECTROSURGICAL) ×1 IMPLANT
GAUZE SPONGE 4X4 12PLY STRL (GAUZE/BANDAGES/DRESSINGS) ×1 IMPLANT
GLOVE SURG UNDER LTX SZ7.5 (GLOVE) ×2 IMPLANT
GOWN STRL REUS W/ TWL LRG LVL3 (GOWN DISPOSABLE) ×2 IMPLANT
GOWN STRL REUS W/ TWL XL LVL3 (GOWN DISPOSABLE) ×1 IMPLANT
GOWN STRL REUS W/TWL LRG LVL3 (GOWN DISPOSABLE) ×2
GOWN STRL REUS W/TWL XL LVL3 (GOWN DISPOSABLE) ×1
GRASPER SUT TROCAR 14GX15 (MISCELLANEOUS) ×2 IMPLANT
IV NS 1000ML (IV SOLUTION) ×1
IV NS 1000ML BAXH (IV SOLUTION) ×1 IMPLANT
KIT BASIN OR (CUSTOM PROCEDURE TRAY) ×2 IMPLANT
KIT TURNOVER KIT B (KITS) ×2 IMPLANT
NDL INSUFFLATION 14GA 120MM (NEEDLE) ×1 IMPLANT
NEEDLE INSUFFLATION 14GA 120MM (NEEDLE) ×2 IMPLANT
NS IRRIG 1000ML POUR BTL (IV SOLUTION) ×2 IMPLANT
PAD ARMBOARD 7.5X6 YLW CONV (MISCELLANEOUS) ×4 IMPLANT
SET CYSTO W/LG BORE CLAMP LF (SET/KITS/TRAYS/PACK) ×2 IMPLANT
SET EXT 12IN DIALYSIS STAY-SAF (MISCELLANEOUS) ×2 IMPLANT
SET IRRIG TUBING LAPAROSCOPIC (IRRIGATION / IRRIGATOR) ×1 IMPLANT
SET TUBE SMOKE EVAC HIGH FLOW (TUBING) ×2 IMPLANT
SLEEVE ENDOPATH XCEL 5M (ENDOMECHANICALS) ×4 IMPLANT
STYLET FALLER (MISCELLANEOUS) ×2 IMPLANT
STYLET FALLER MEDIONICS (MISCELLANEOUS) ×2 IMPLANT
SUT MNCRL AB 4-0 PS2 18 (SUTURE) ×2 IMPLANT
SUT PROLENE 0 SH 30 (SUTURE) ×4 IMPLANT
SUT SILK 0 TIES 10X30 (SUTURE) ×2 IMPLANT
SUT VICRYL 3 0 (SUTURE) ×1 IMPLANT
TOWEL GREEN STERILE (TOWEL DISPOSABLE) ×1 IMPLANT
TOWEL GREEN STERILE FF (TOWEL DISPOSABLE) ×2 IMPLANT
TRAY LAPAROSCOPIC MC (CUSTOM PROCEDURE TRAY) ×2 IMPLANT
TROCAR 5MMX150MM (TROCAR) ×1 IMPLANT
TROCAR XCEL NON-BLD 11X100MML (ENDOMECHANICALS) IMPLANT
TROCAR XCEL NON-BLD 5MMX100MML (ENDOMECHANICALS) ×2 IMPLANT
WATER STERILE IRR 1000ML POUR (IV SOLUTION) ×2 IMPLANT

## 2021-03-17 NOTE — Interval H&P Note (Signed)
History and Physical Interval Note:  03/17/2021 7:21 AM  Glenn Olson  has presented today for surgery, with the diagnosis of ESRD.  The various methods of treatment have been discussed with the patient and family. After consideration of risks, benefits and other options for treatment, the patient has consented to  Procedure(s): [place] (CAPD) CATHETER with possible omentopexy (N/A) as a surgical intervention.  The patient's history has been reviewed, patient examined, no change in status, stable for surgery.  I have reviewed the patient's chart and labs.  Questions were answered to the patient's satisfaction.     Servando Snare

## 2021-03-17 NOTE — Discharge Instructions (Signed)
Diagnostic Laparoscopy, Care After The following information offers guidance on how to care for yourself after your procedure. Your health care provider may also give you more specific instructions. If you have problems or questions, contact your health care provider. What can I expect after the procedure? After the procedure, it is common to have: Mild discomfort in the abdomen. Sore throat. Women who have laparoscopy with a pelvic examination may have mild cramping and fluid coming from the vagina for a few days after the procedure. Follow these instructions at home: Medicines Take over-the-counter and prescription medicines only as told by your health care provider. If you were prescribed an antibiotic medicine, take it as told by your health care provider. Do not stop taking the antibiotic even if you start to feel better. Ask your health care provider if the medicine prescribed to you: Requires you to avoid driving or using machinery. Can cause constipation. You may need to take these actions to prevent or treat constipation: Drink enough fluid to keep your urine pale yellow. Take over-the-counter or prescription medicines. Eat foods that are high in fiber, such as beans, whole grains, and fresh fruits and vegetables. Limit foods that are high in fat and processed sugars, such as fried or sweet foods. Incision care Follow instructions from your health care provider about how to take care of your incisions. Make sure you: Wash your hands with soap and water for at least 20 seconds before and after you change your bandage (dressing). If soap and water are not available, use hand sanitizer. Change your dressing as told by your health care provider. Leave stitches (sutures), skin glue, or surgical tape in place. These skin closures may need to stay in place for 2 weeks or longer. If surgical tape edges start to loosen and curl up, you may trim the loose edges. Do not remove the surgical tape  completely unless your health care provider tells you to do that. Check your incision areas every day for signs of infection. Check for: Redness, swelling, or pain. Fluid or blood. Warmth. Pus or a bad smell. Activity Return to your normal activities as told by your health care provider. Ask your health care provider what activities are safe for you. Do not lift anything that is heavier than 10 lb (4.5 kg), or the limit that you are told, until your health care provider says that it is safe. Avoid sitting for a long time without moving. Get up to take short walks every 1-2 hours. This is important to improve blood flow and breathing. Ask for help if you feel weak or unsteady. General instructions Do not use any products that contain nicotine or tobacco. These products include cigarettes, chewing tobacco, and vaping devices, such as e-cigarettes. If you need help quitting, ask your health care provider. If you were given a sedative during the procedure, it can affect you for several hours. Do not drive or operate machinery until your health care provider says that it is safe. Do not take baths, swim, or use a hot tub until your health care provider approves. Ask your health care provider if you may take showers. You may only be allowed to take sponge baths. Keep all follow-up visits. This is important. Contact a health care provider if: You develop shoulder pain. You feel light-headed or faint. You are unable to pass gas or have a bowel movement. You feel nauseous or you vomit. You develop a rash. You have any of these signs of infection: Redness,  swelling, or pain around an incision. Fluid or blood coming from an incision. Warmth coming from an incision. Pus or a bad smell coming from an incision. A fever or chills. Get help right away if: You have severe pain. You have vomiting that does not go away. You have heavy bleeding from the vagina. Any incision opens up. You have trouble  breathing. You have chest pain. These symptoms may represent a serious problem that is an emergency. Do not wait to see if the symptoms will go away. Get medical help right away. Call your local emergency services (911 in the [place] Do not drive yourself to the hospital. Summary After the procedure, it is common to have mild discomfort in the abdomen and a sore throat. Check your incision areas every day for signs of infection. Return to your normal activities as told by your health care provider. Ask your health care provider what activities are safe for you. This information is not intended to replace advice given to you by your health care provider. Make sure you discuss any questions you have with your health care provider.

## 2021-03-17 NOTE — Transfer of Care (Signed)
Immediate Anesthesia Transfer of Care Note  Patient: Glenn Olson  Procedure(s) Performed: ATTEMPTED INSERTION OF LAPAROSCOPIC CONTINUOUS AMBULATORY PERITONEAL DIALYSIS (CAPD) CATHETER (Abdomen)  Patient Location: PACU  Anesthesia Type:General  Level of Consciousness: awake, alert  and oriented  Airway & Oxygen Therapy: Patient Spontanous Breathing and Patient connected to nasal cannula oxygen  Post-op Assessment: Report given to RN, Post -op Vital signs reviewed and stable and Patient moving all extremities X 4  Post vital signs: Reviewed and stable  Last Vitals:  Vitals Value Taken Time  BP 130/73 03/17/21 0830  Temp    Pulse 86 03/17/21 0831  Resp 18 03/17/21 0831  SpO2 97 % 03/17/21 0831  Vitals shown include unvalidated device data.  Last Pain:  Vitals:   03/17/21 0611  TempSrc:   PainSc: 0-No pain         Complications: No notable events documented.

## 2021-03-17 NOTE — Anesthesia Postprocedure Evaluation (Signed)
Anesthesia Post Note  Patient: Glenn Olson  Procedure(s) Performed: ATTEMPTED INSERTION OF LAPAROSCOPIC CONTINUOUS AMBULATORY PERITONEAL DIALYSIS (CAPD) CATHETER (Abdomen)     Patient location during evaluation: PACU Anesthesia Type: General Level of consciousness: sedated and patient cooperative Pain management: pain level controlled Vital Signs Assessment: post-procedure vital signs reviewed and stable Respiratory status: spontaneous breathing Cardiovascular status: stable Anesthetic complications: no   No notable events documented.  Last Vitals:  Vitals:   03/17/21 0900 03/17/21 0910  BP: 116/69 126/69  Pulse: 83 86  Resp: 12 11  Temp:  (!) 36.3 C  SpO2: 94% 96%    Last Pain:  Vitals:   03/17/21 0910  TempSrc:   PainSc: 0-No pain                 Nolon Nations

## 2021-03-17 NOTE — Anesthesia Procedure Notes (Signed)
Procedure Name: Intubation Date/Time: 03/17/2021 7:41 AM Performed by: Lorie Phenix, CRNA Pre-anesthesia Checklist: Patient identified, Emergency Drugs available, Suction available and Patient being monitored Patient Re-evaluated:Patient Re-evaluated prior to induction Oxygen Delivery Method: Circle system utilized Preoxygenation: Pre-oxygenation with 100% oxygen Induction Type: IV induction Ventilation: Mask ventilation without difficulty Laryngoscope Size: Mac and 4 Grade View: Grade I Tube type: Oral Tube size: 7.5 mm Number of attempts: 1 Airway Equipment and Method: Stylet Placement Confirmation: ETT inserted through vocal cords under direct vision, positive ETCO2 and breath sounds checked- equal and bilateral Secured at: 23 cm Tube secured with: Tape Dental Injury: Teeth and Oropharynx as per pre-operative assessment

## 2021-03-17 NOTE — Op Note (Signed)
° ° °  Patient name: Coreon Simkins MRN: [id number] DOB: 1941/01/08 Sex: male  03/17/2021 Pre-operative Diagnosis: End-stage renal disease Post-operative diagnosis:  Same Surgeon:  Erlene Quan C. Donzetta Matters, MD Assistant: Leontine Locket, PA Procedure Performed: Diagnostic laparoscopy  Indications: 81 year old male currently on dialysis via left IJ tunnel catheter he has a nonfunctioning left upper arm AV graft.  Patient is not interested in any further hemodialysis accesses and we have discussed high risk placement of peritoneal dialysis catheter.  I previously discussed with the patient that there may be injury to intra-abdominal compartments requiring conversion to exploratory laparotomy, inability to place a catheter, placement of a catheter that primarily does not function and requires removal.  He demonstrates good understanding and agrees to proceed.  An assistant was necessary to assist with camera inspection of the abdomen.  Findings: There were significant adhesions to the midline laparotomy incision.  I was in a cavity that appeared to only be on the right upper and middle quadrant.  I elected that no catheter could be placed that would be functional given the significant lysis of adhesions and risk for catheter dysfunction in the future.  Patient currently does not want any further hemodialysis access we can see him to place right upper extremity access if he changes his mind.   Procedure:  The patient was identified in the holding area and taken to the operating room where is placed supine operative when general anesthesia was induced.  He was sterilely prepped draped in the abdomen the usual fashion, antibiotics were administered and a timeout was called.  I began using a varies needle in the left upper quadrant 2 cm off the costal margin.  We used a 3 click technique.  Fluid was easily flushed.  He was first took 2 low flow followed by high flow and the abdomen was insufflated.  An Optiview trocar  was then used 2 cm medial to this to enter the abdomen.  Using the camera I inspected there was significant adhesions to the midline as well as entering into the lower quadrants I could only really see the right upper and to the middle quadrant I could not see to the lower quadrant.  There were no apparent injuries from the varies needle.  I elected that no catheter will be functional and will be high risk for intra-abdominal injury with significant lysis of adhesions.  With this the camera was removed.  The varies needle had already been removed.  The CO2 was allowed out of the abdomen he was desufflated and the port was removed.  Abdominal incision was closed with 4 Monocryl and Dermabond was placed at the site of the trocar and the varies needle.  Patient was then awakened from anesthesia having tolerated procedure without immediate complication.  All counts were correct at completion.  EBL: 10cc   Buck Mcaffee C. Donzetta Matters, MD [facility] Office: [phone] Pager: [phone]

## 2021-03-18 ENCOUNTER — Encounter (HOSPITAL_COMMUNITY): Payer: Self-pay | Admitting: Vascular Surgery

## 2021-03-24 DIAGNOSIS — Z992 Dependence on renal dialysis: Secondary | ICD-10-CM | POA: Diagnosis not present

## 2021-03-24 DIAGNOSIS — N186 End stage renal disease: Secondary | ICD-10-CM | POA: Diagnosis not present

## 2021-03-24 DIAGNOSIS — Z452 Encounter for adjustment and management of vascular access device: Secondary | ICD-10-CM | POA: Diagnosis not present

## 2021-06-07 DIAGNOSIS — Z20822 Contact with and (suspected) exposure to covid-19: Secondary | ICD-10-CM | POA: Diagnosis not present

## 2021-06-29 ENCOUNTER — Ambulatory Visit: Payer: TRICARE For Life (TFL)

## 2021-09-05 ENCOUNTER — Ambulatory Visit: Payer: TRICARE For Life (TFL) | Admitting: Urology

## 2021-09-12 ENCOUNTER — Ambulatory Visit: Payer: Medicare Other | Admitting: Urology

## 2021-09-12 DIAGNOSIS — N312 Flaccid neuropathic bladder, not elsewhere classified: Secondary | ICD-10-CM

## 2022-01-15 ENCOUNTER — Ambulatory Visit (INDEPENDENT_AMBULATORY_CARE_PROVIDER_SITE_OTHER): Payer: Medicare Other | Admitting: Urology

## 2022-01-15 DIAGNOSIS — N312 Flaccid neuropathic bladder, not elsewhere classified: Secondary | ICD-10-CM

## 2022-01-15 NOTE — Progress Notes (Signed)
Suprapubic Cath   Patient is present today for a suprapubic catheter placement after existing tube have falling out per patient due to urinary retention.  44m of water was drained from the balloon,   Site was cleaned and prepped in a sterile fashion with betadine.  A 16 FR foley cath was place into the tract no complications were noted. Urine return was noted upon flushing, 10 ml of sterile water was inflated into the balloon and the catheter was cap.. A night bag was given to patient and proper instruction was given on how to switch bags.    Performed by: assisted with Dr.Eskridge to ensure correct placement. [id number]  Follow up: keep next scheduled nurse visit

## 2022-01-17 IMAGING — MR MR FEMUR*R* W/O CM
5 series · 40 of 40 positions shown · non-contrast
Comparison: CT scan 11/29/2020

CLINICAL DATA: Sudden right thigh pain.  Difficulty ambulating.

EXAM:
MRI OF THE RIGHT FEMUR WITHOUT CONTRAST
TECHNIQUE: Multiplanar, multisequence MR imaging of the right femur was
performed. No intravenous contrast was administered.

[Series 7: composed cor t1_comp_filt · coronal · right · 5.0mm · 1.08mm/px · 4 of 34 slices shown]
[im 1/34]
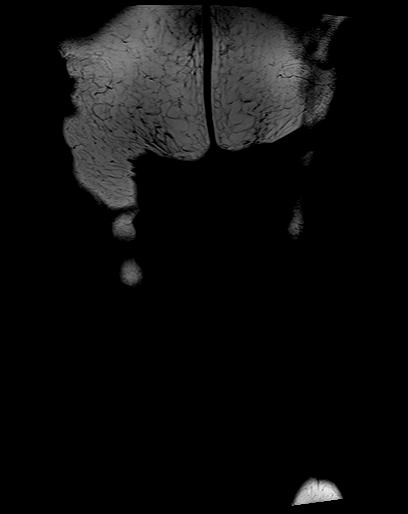
[im 12/34]
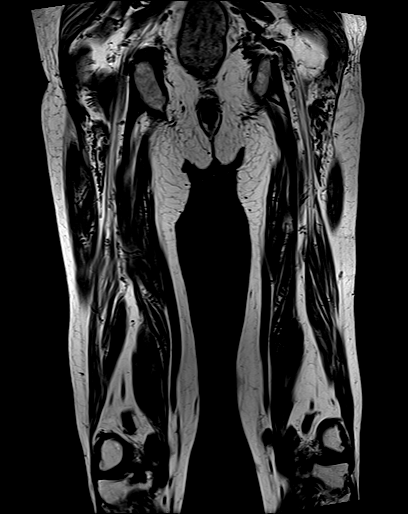
[im 23/34]
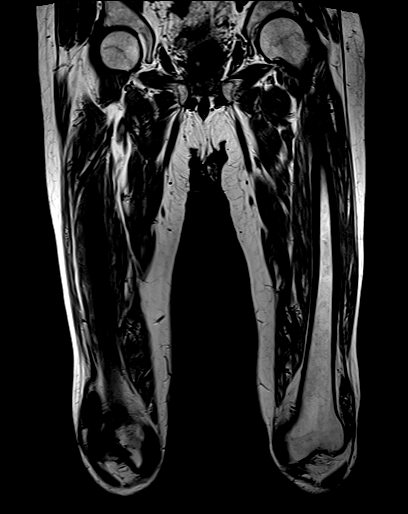
[im 34/34]
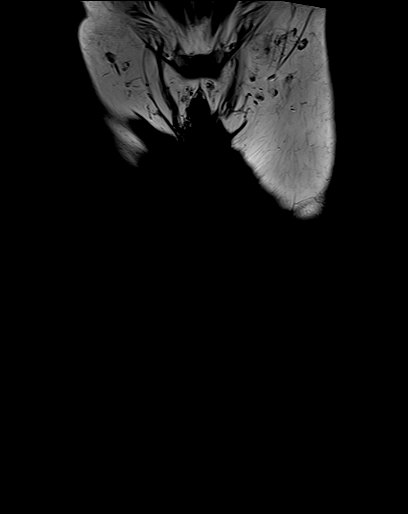

[Series 11: composed cor stir_comp_filt · coronal · right · 5.0mm · 1.08mm/px · 5 of 34 slices shown]
[im 1/34]
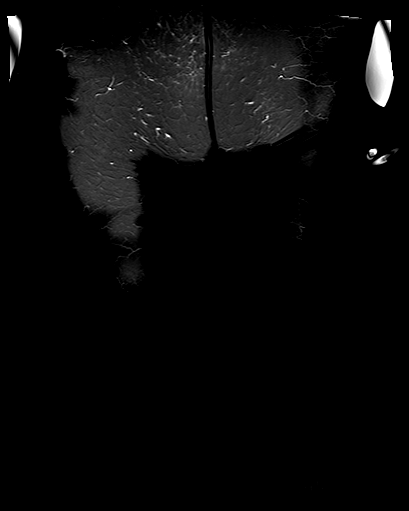
[im 9/34]
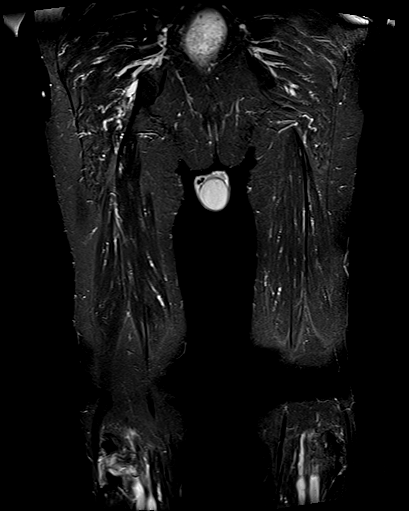
[im 17/34]
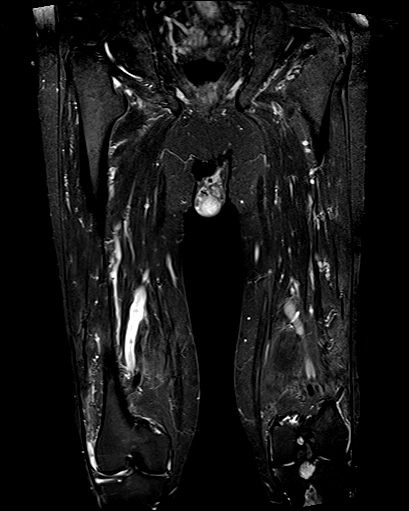
[im 25/34]
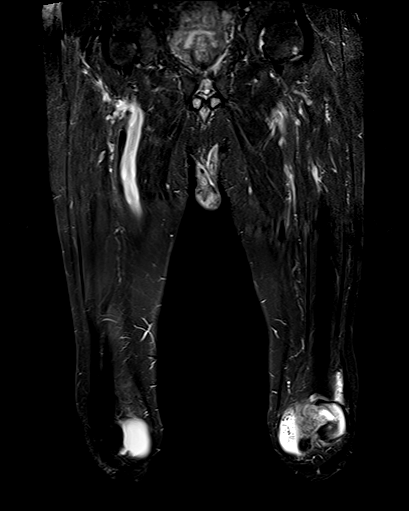
[im 34/34]
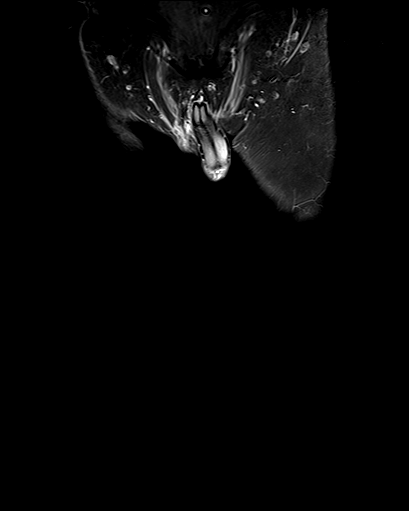

[Series 14: ax t1_comp · axial · right · 5.0mm · 0.86mm/px · z∈[-247,+281]mm · 13 of 98 slices shown]
[im 1/98]
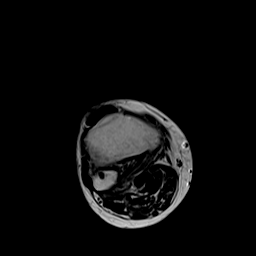
[im 9/98]
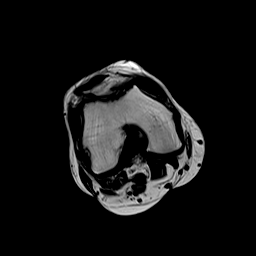
[im 17/98]
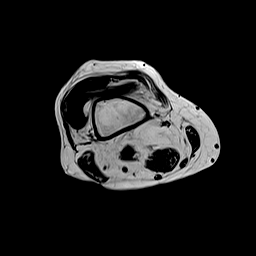
[im 25/98]
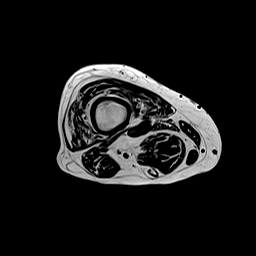
[im 33/98]
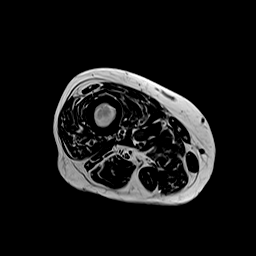
[im 41/98]
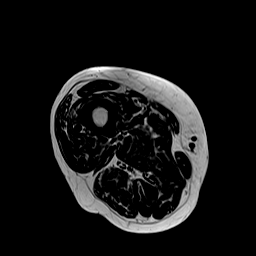
[im 49/98]
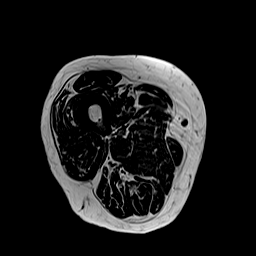
[im 57/98]
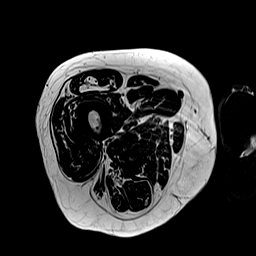
[im 65/98]
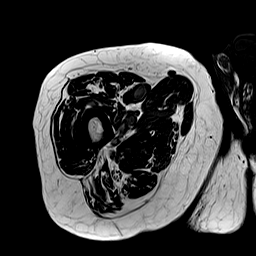
[im 73/98]
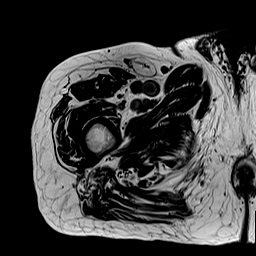
[im 81/98]
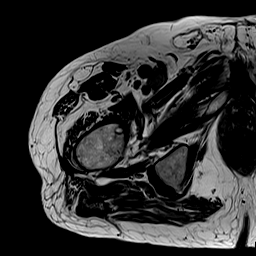
[im 89/98]
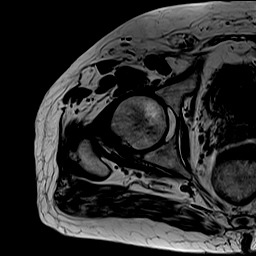
[im 98/98]
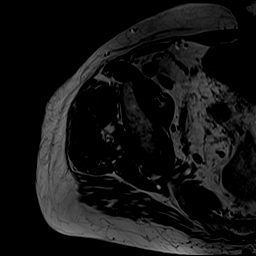

[Series 17: T2 · axial · right · 5.0mm · 0.86mm/px · z∈[-247,+281]mm · 13 of 98 slices shown]
[im 1/98]
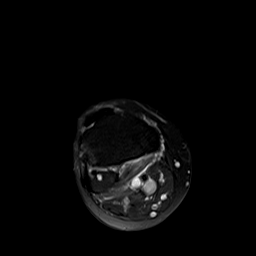
[im 9/98]
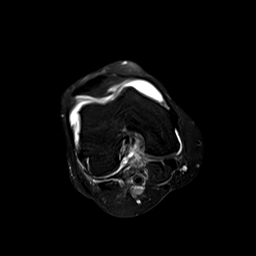
[im 17/98]
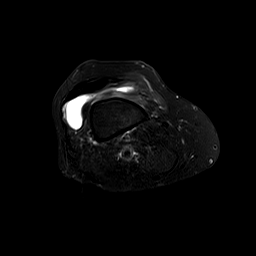
[im 25/98]
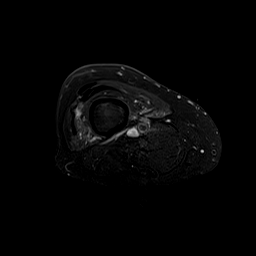
[im 33/98]
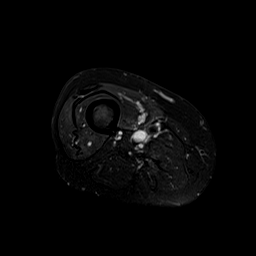
[im 41/98]
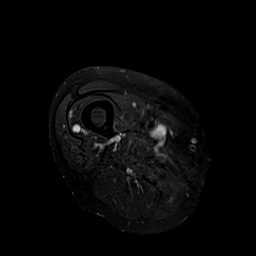
[im 49/98]
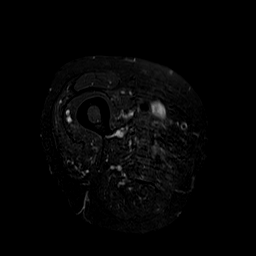
[im 57/98]
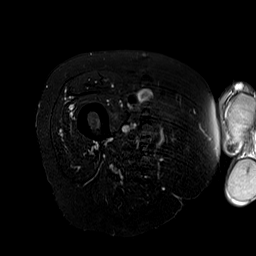
[im 65/98]
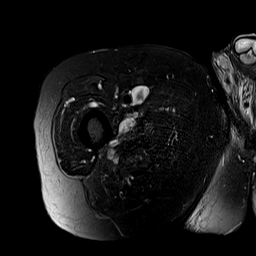
[im 73/98]
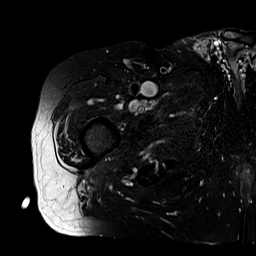
[im 81/98]
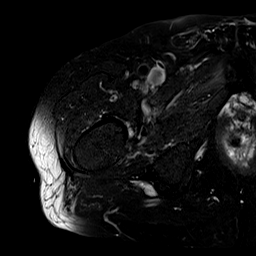
[im 89/98]
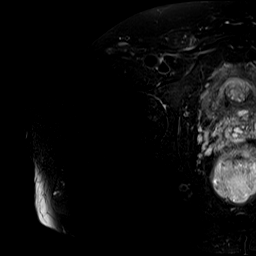
[im 98/98]
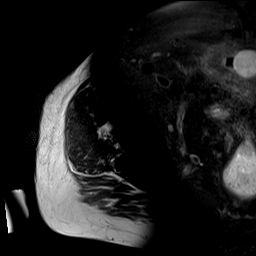

[Series 20: t2_tse_sag fs_comp · sagittal · right · 5.0mm · 0.94mm/px · 5 of 36 slices shown]
[im 1/36]
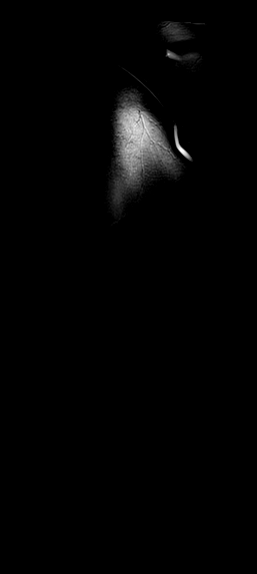
[im 9/36]
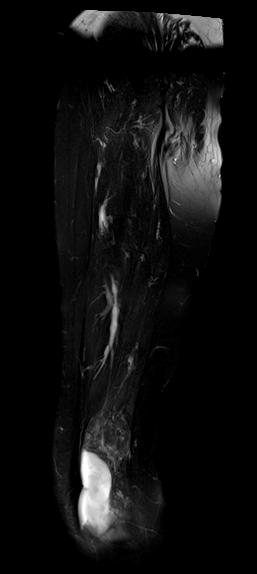
[im 18/36]
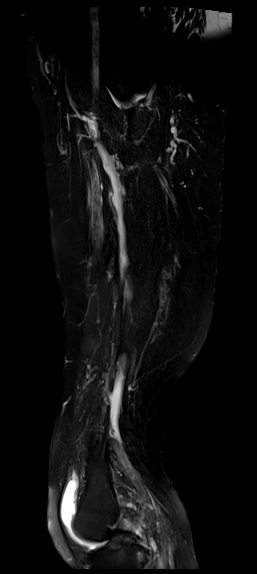
[im 27/36]
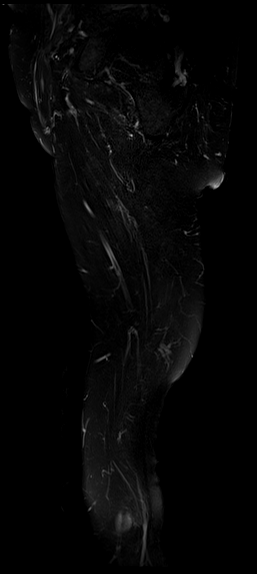
[im 36/36]
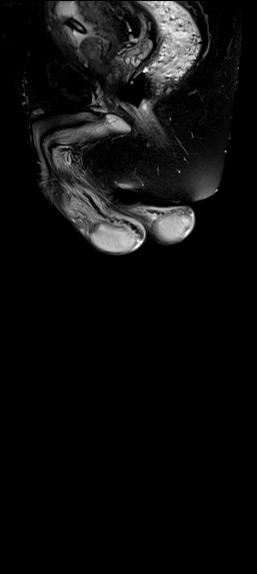

[40 of 40 positions shown; findings below may reference images not displayed]

FINDINGS: Both hips are normally located. Mild age related degenerative
changes but no stress fracture or AVN. Both femurs are intact. No
bone lesions or stress fracture.

Moderate degenerative changes are noted at both knee joints. There
are also changes of synovitis bilaterally but much more pronounced
on the left side. These could be Bilqis bodies or PVNS. There is also
a moderate-sized subchondral cyst near the PCL attachment of the
left knee and evidence of a remote metaphyseal bone infarct in the
left proximal tibia.

The quadriceps and hamstring tendons are intact. Mild edema like
signal changes noted in medial adductor muscles possibly reflecting
a muscle strain or partial tear. The quadriceps and hamstring
musculature are unremarkable. No acute tear or intramuscular
hematoma. Small intramuscular lipoma is noted in the quadriceps
femoris muscle.

Small right inguinal hernia containing fat. No significant vascular
abnormalities or adenopathy.
IMPRESSION: 1. Moderate degenerative changes at both knee joints and bilateral
joint effusions and synovitis. Possible PVNS on the left.
2. Both hips are normally located. Mild degenerative changes but no
stress fracture or AVN.
3. Mild edema like signal changes in the medial adductor muscles
possibly reflecting a muscle strain or partial tear.
4. No significant intrapelvic abnormalities are identified.

## 2022-03-26 IMAGING — DX DG CHEST 1V PORT
1 series · 1 of 1 positions shown · non-contrast
Comparison: 09/27/2020.

CLINICAL DATA: sob

EXAM:
PORTABLE CHEST 1 VIEW

[chest ap]
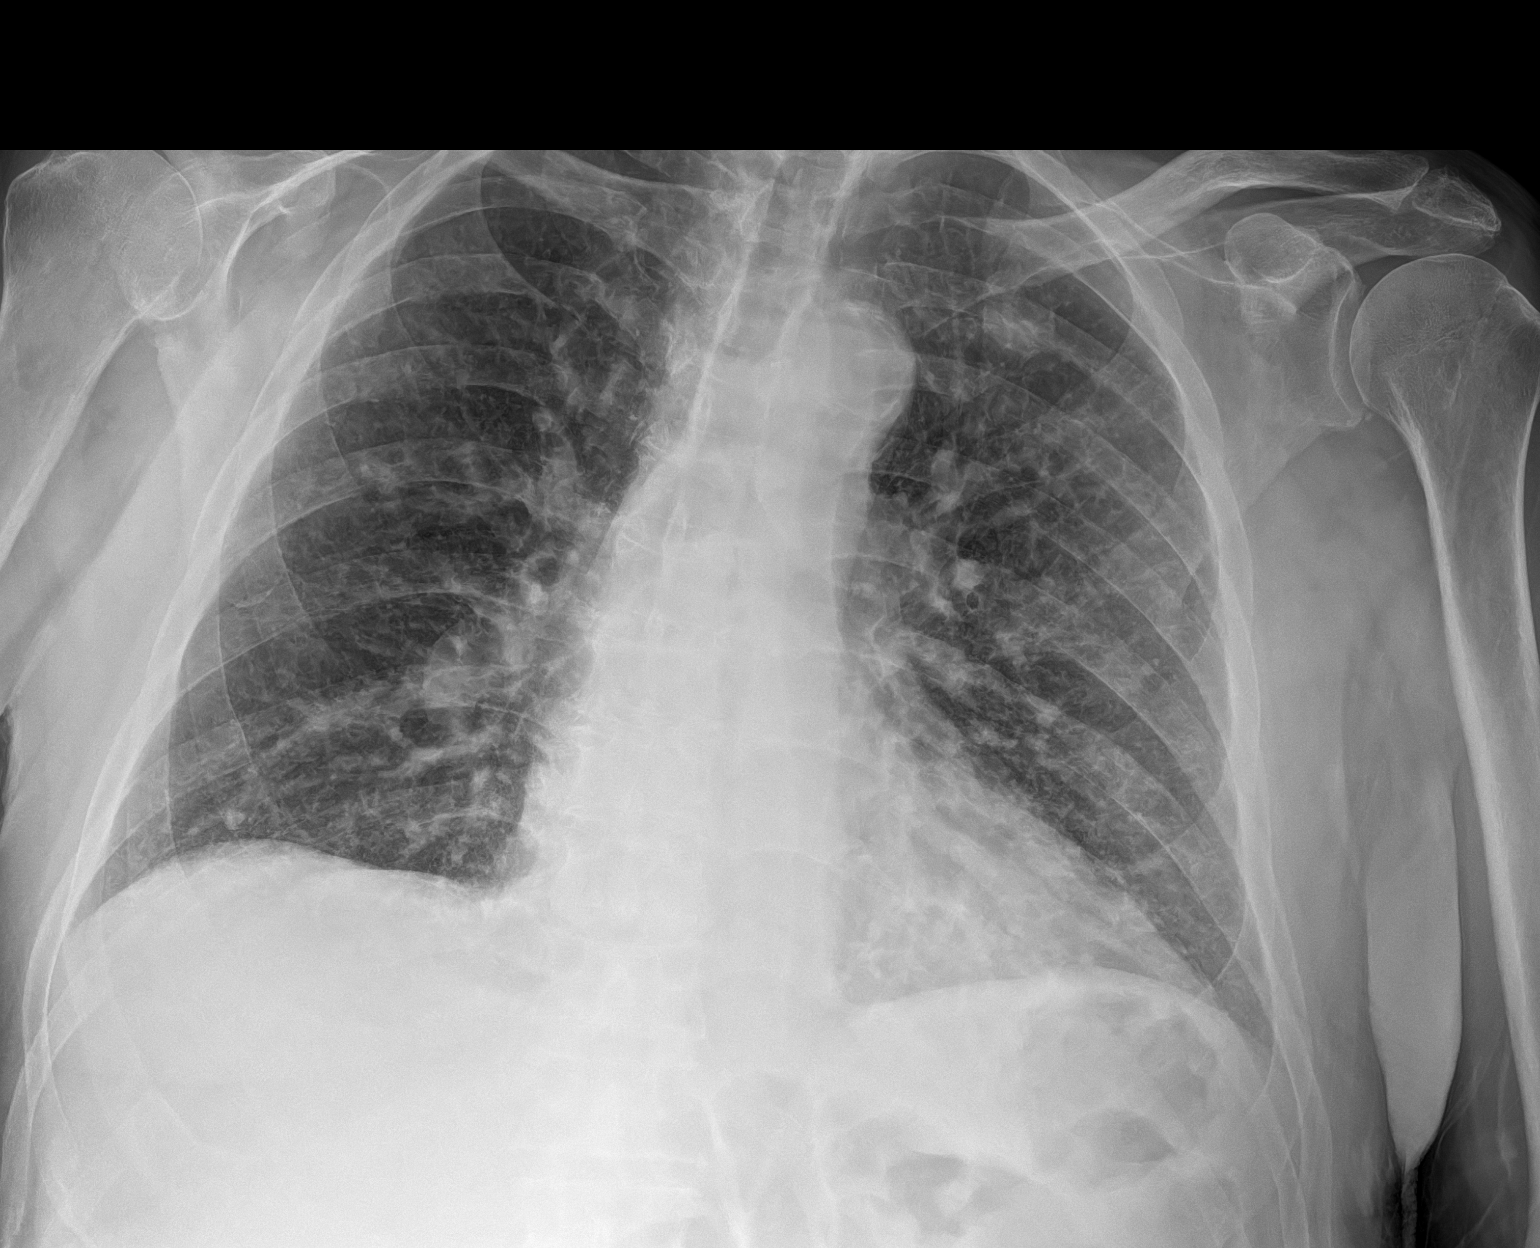

[1 of 1 positions shown; findings below may reference images not displayed]

FINDINGS: Cardiomediastinal silhouette is within normal limits and similar.
Pulmonary vascular congestion. Mild diffuse interstitial opacities.
Multiple nodular opacities in the right midlung. No visible pleural
effusions or pneumothorax.
IMPRESSION: 1. Pulmonary vascular congestion and mild diffuse interstitial
opacities, suggestive of mild interstitial edema versus the sequela
of recurrent bouts of CHF.
2. Multiple nodular opacities in the right midlung. Recommend
nonurgent chest CT to evaluate for pulmonary nodules.

## 2022-05-30 ENCOUNTER — Encounter (HOSPITAL_COMMUNITY)
Admission: EM | Disposition: A | Discharge status: Rehabilitation Facility | Payer: Self-pay | Source: Home / Self Care | Attending: Internal Medicine

## 2022-05-30 ENCOUNTER — Inpatient Hospital Stay (HOSPITAL_COMMUNITY)
Admission: EM | Admit: 2022-05-30 | Discharge: 2022-06-20 | DRG: 673 | Disposition: A | Discharge status: Rehabilitation Facility | Payer: Medicare Other | Attending: Internal Medicine | Admitting: Internal Medicine

## 2022-05-30 ENCOUNTER — Encounter (HOSPITAL_COMMUNITY): Payer: Self-pay | Admitting: Emergency Medicine

## 2022-05-30 ENCOUNTER — Emergency Department (HOSPITAL_COMMUNITY): Payer: Medicare Other

## 2022-05-30 ENCOUNTER — Other Ambulatory Visit: Payer: Self-pay

## 2022-05-30 DIAGNOSIS — M898X9 Other specified disorders of bone, unspecified site: Secondary | ICD-10-CM | POA: Diagnosis not present

## 2022-05-30 DIAGNOSIS — T50916A Underdosing of multiple unspecified drugs, medicaments and biological substances, initial encounter: Secondary | ICD-10-CM | POA: Diagnosis present

## 2022-05-30 DIAGNOSIS — D631 Anemia in chronic kidney disease: Secondary | ICD-10-CM | POA: Diagnosis present

## 2022-05-30 DIAGNOSIS — E875 Hyperkalemia: Secondary | ICD-10-CM | POA: Diagnosis not present

## 2022-05-30 DIAGNOSIS — I351 Nonrheumatic aortic (valve) insufficiency: Secondary | ICD-10-CM | POA: Diagnosis not present

## 2022-05-30 DIAGNOSIS — N186 End stage renal disease: Secondary | ICD-10-CM

## 2022-05-30 DIAGNOSIS — R0682 Tachypnea, not elsewhere classified: Secondary | ICD-10-CM | POA: Diagnosis not present

## 2022-05-30 DIAGNOSIS — Z960 Presence of urogenital implants: Secondary | ICD-10-CM | POA: Diagnosis present

## 2022-05-30 DIAGNOSIS — I4892 Unspecified atrial flutter: Secondary | ICD-10-CM | POA: Diagnosis not present

## 2022-05-30 DIAGNOSIS — N185 Chronic kidney disease, stage 5: Secondary | ICD-10-CM | POA: Diagnosis not present

## 2022-05-30 DIAGNOSIS — R519 Headache, unspecified: Secondary | ICD-10-CM | POA: Diagnosis not present

## 2022-05-30 DIAGNOSIS — D696 Thrombocytopenia, unspecified: Secondary | ICD-10-CM | POA: Diagnosis not present

## 2022-05-30 DIAGNOSIS — G9349 Other encephalopathy: Secondary | ICD-10-CM | POA: Diagnosis present

## 2022-05-30 DIAGNOSIS — Z66 Do not resuscitate: Secondary | ICD-10-CM | POA: Diagnosis present

## 2022-05-30 DIAGNOSIS — Z978 Presence of other specified devices: Secondary | ICD-10-CM | POA: Diagnosis not present

## 2022-05-30 DIAGNOSIS — Z8249 Family history of ischemic heart disease and other diseases of the circulatory system: Secondary | ICD-10-CM | POA: Diagnosis not present

## 2022-05-30 DIAGNOSIS — I120 Hypertensive chronic kidney disease with stage 5 chronic kidney disease or end stage renal disease: Secondary | ICD-10-CM | POA: Diagnosis present

## 2022-05-30 DIAGNOSIS — N19 Unspecified kidney failure: Secondary | ICD-10-CM

## 2022-05-30 DIAGNOSIS — G4700 Insomnia, unspecified: Secondary | ICD-10-CM | POA: Diagnosis not present

## 2022-05-30 DIAGNOSIS — Z9049 Acquired absence of other specified parts of digestive tract: Secondary | ICD-10-CM

## 2022-05-30 DIAGNOSIS — I4819 Other persistent atrial fibrillation: Secondary | ICD-10-CM | POA: Diagnosis not present

## 2022-05-30 DIAGNOSIS — N179 Acute kidney failure, unspecified: Secondary | ICD-10-CM | POA: Diagnosis not present

## 2022-05-30 DIAGNOSIS — N2581 Secondary hyperparathyroidism of renal origin: Secondary | ICD-10-CM | POA: Diagnosis present

## 2022-05-30 DIAGNOSIS — Z7982 Long term (current) use of aspirin: Secondary | ICD-10-CM

## 2022-05-30 DIAGNOSIS — Z7189 Other specified counseling: Secondary | ICD-10-CM | POA: Diagnosis not present

## 2022-05-30 DIAGNOSIS — I452 Bifascicular block: Secondary | ICD-10-CM | POA: Diagnosis present

## 2022-05-30 DIAGNOSIS — Z6824 Body mass index (BMI) 24.0-24.9, adult: Secondary | ICD-10-CM | POA: Diagnosis not present

## 2022-05-30 DIAGNOSIS — N189 Chronic kidney disease, unspecified: Secondary | ICD-10-CM | POA: Diagnosis not present

## 2022-05-30 DIAGNOSIS — N312 Flaccid neuropathic bladder, not elsewhere classified: Secondary | ICD-10-CM | POA: Diagnosis not present

## 2022-05-30 DIAGNOSIS — K219 Gastro-esophageal reflux disease without esophagitis: Secondary | ICD-10-CM | POA: Diagnosis present

## 2022-05-30 DIAGNOSIS — I483 Typical atrial flutter: Secondary | ICD-10-CM | POA: Diagnosis not present

## 2022-05-30 DIAGNOSIS — Z992 Dependence on renal dialysis: Secondary | ICD-10-CM | POA: Diagnosis not present

## 2022-05-30 DIAGNOSIS — R5381 Other malaise: Secondary | ICD-10-CM | POA: Diagnosis not present

## 2022-05-30 DIAGNOSIS — R627 Adult failure to thrive: Secondary | ICD-10-CM | POA: Diagnosis present

## 2022-05-30 DIAGNOSIS — E872 Acidosis, unspecified: Secondary | ICD-10-CM | POA: Diagnosis present

## 2022-05-30 DIAGNOSIS — E876 Hypokalemia: Secondary | ICD-10-CM | POA: Diagnosis present

## 2022-05-30 DIAGNOSIS — N250 Renal osteodystrophy: Secondary | ICD-10-CM | POA: Diagnosis present

## 2022-05-30 DIAGNOSIS — E440 Moderate protein-calorie malnutrition: Secondary | ICD-10-CM | POA: Diagnosis present

## 2022-05-30 DIAGNOSIS — E559 Vitamin D deficiency, unspecified: Secondary | ICD-10-CM | POA: Diagnosis not present

## 2022-05-30 DIAGNOSIS — L299 Pruritus, unspecified: Secondary | ICD-10-CM | POA: Diagnosis not present

## 2022-05-30 DIAGNOSIS — Z515 Encounter for palliative care: Secondary | ICD-10-CM

## 2022-05-30 DIAGNOSIS — R079 Chest pain, unspecified: Secondary | ICD-10-CM | POA: Diagnosis not present

## 2022-05-30 DIAGNOSIS — D509 Iron deficiency anemia, unspecified: Secondary | ICD-10-CM | POA: Diagnosis not present

## 2022-05-30 DIAGNOSIS — I340 Nonrheumatic mitral (valve) insufficiency: Secondary | ICD-10-CM | POA: Diagnosis not present

## 2022-05-30 DIAGNOSIS — R7989 Other specified abnormal findings of blood chemistry: Secondary | ICD-10-CM | POA: Diagnosis not present

## 2022-05-30 DIAGNOSIS — I361 Nonrheumatic tricuspid (valve) insufficiency: Secondary | ICD-10-CM | POA: Diagnosis not present

## 2022-05-30 DIAGNOSIS — Z79899 Other long term (current) drug therapy: Secondary | ICD-10-CM | POA: Diagnosis not present

## 2022-05-30 DIAGNOSIS — Z91158 Patient's noncompliance with renal dialysis for other reason: Secondary | ICD-10-CM

## 2022-05-30 DIAGNOSIS — R0789 Other chest pain: Secondary | ICD-10-CM | POA: Diagnosis present

## 2022-05-30 DIAGNOSIS — E860 Dehydration: Secondary | ICD-10-CM | POA: Diagnosis not present

## 2022-05-30 DIAGNOSIS — H9193 Unspecified hearing loss, bilateral: Secondary | ICD-10-CM | POA: Diagnosis not present

## 2022-05-30 DIAGNOSIS — R0981 Nasal congestion: Secondary | ICD-10-CM | POA: Diagnosis not present

## 2022-05-30 DIAGNOSIS — R000 Tachycardia, unspecified: Secondary | ICD-10-CM | POA: Diagnosis not present

## 2022-05-30 DIAGNOSIS — Z4901 Encounter for fitting and adjustment of extracorporeal dialysis catheter: Secondary | ICD-10-CM | POA: Diagnosis not present

## 2022-05-30 DIAGNOSIS — D649 Anemia, unspecified: Secondary | ICD-10-CM | POA: Diagnosis not present

## 2022-05-30 DIAGNOSIS — R54 Age-related physical debility: Secondary | ICD-10-CM | POA: Diagnosis present

## 2022-05-30 DIAGNOSIS — I959 Hypotension, unspecified: Secondary | ICD-10-CM | POA: Diagnosis not present

## 2022-05-30 DIAGNOSIS — R64 Cachexia: Secondary | ICD-10-CM | POA: Diagnosis present

## 2022-05-30 DIAGNOSIS — Z91128 Patient's intentional underdosing of medication regimen for other reason: Secondary | ICD-10-CM

## 2022-05-30 DIAGNOSIS — I4891 Unspecified atrial fibrillation: Secondary | ICD-10-CM | POA: Diagnosis not present

## 2022-05-30 DIAGNOSIS — N319 Neuromuscular dysfunction of bladder, unspecified: Secondary | ICD-10-CM | POA: Diagnosis not present

## 2022-05-30 DIAGNOSIS — Z452 Encounter for adjustment and management of vascular access device: Secondary | ICD-10-CM | POA: Diagnosis not present

## 2022-05-30 LAB — CBC WITH DIFFERENTIAL/PLATELET
Abs Immature Granulocytes: 0.14 10*3/uL — ABNORMAL HIGH (ref 0.00–0.07)
Basophils Absolute: 0 10*3/uL (ref 0.0–0.1)
Basophils Relative: 0 %
Eosinophils Absolute: 0.1 10*3/uL (ref 0.0–0.5)
Eosinophils Relative: 1 %
HCT: 25.5 % — ABNORMAL LOW (ref 39.0–52.0)
Hemoglobin: 8.6 g/dL — ABNORMAL LOW (ref 13.0–17.0)
Immature Granulocytes: 2 %
Lymphocytes Relative: 5 %
Lymphs Abs: 0.5 10*3/uL — ABNORMAL LOW (ref 0.7–4.0)
MCH: 30.2 pg (ref 26.0–34.0)
MCHC: 33.7 g/dL (ref 30.0–36.0)
MCV: 89.5 fL (ref 80.0–100.0)
Monocytes Absolute: 0.3 10*3/uL (ref 0.1–1.0)
Monocytes Relative: 3 %
Neutro Abs: 8.3 10*3/uL — ABNORMAL HIGH (ref 1.7–7.7)
Neutrophils Relative %: 89 %
Platelets: 226 10*3/uL (ref 150–400)
RBC: 2.85 MIL/uL — ABNORMAL LOW (ref 4.22–5.81)
RDW: 15.9 % — ABNORMAL HIGH (ref 11.5–15.5)
WBC: 9.3 10*3/uL (ref 4.0–10.5)
nRBC: 0 % (ref 0.0–0.2)

## 2022-05-30 LAB — COMPREHENSIVE METABOLIC PANEL
ALT: 16 U/L (ref 0–44)
AST: 12 U/L — ABNORMAL LOW (ref 15–41)
Albumin: 4.1 g/dL (ref 3.5–5.0)
Alkaline Phosphatase: 103 U/L (ref 38–126)
BUN: 241 mg/dL — ABNORMAL HIGH (ref 8–23)
CO2: <7 mmol/L — ABNORMAL LOW (ref 22–32)
Calcium: 6 mg/dL — CL (ref 8.9–10.3)
Chloride: 104 mmol/L (ref 98–111)
Creatinine, Ser: 13.8 mg/dL — ABNORMAL HIGH (ref 0.61–1.24)
GFR, Estimated: 3 mL/min — ABNORMAL LOW (ref >60)
Glucose, Bld: 137 mg/dL — ABNORMAL HIGH (ref 70–99)
Potassium: 4.5 mmol/L (ref 3.5–5.1)
Sodium: 134 mmol/L — ABNORMAL LOW (ref 135–145)
Total Bilirubin: 0.7 mg/dL (ref 0.3–1.2)
Total Protein: 7.9 g/dL (ref 6.5–8.1)

## 2022-05-30 LAB — TROPONIN I (HIGH SENSITIVITY)
Troponin I (High Sensitivity): 48 ng/L — ABNORMAL HIGH (ref <18)
Troponin I (High Sensitivity): 52 ng/L — ABNORMAL HIGH (ref <18)

## 2022-05-30 SURGERY — INSERTION OF DIALYSIS CATHETER
Anesthesia: General

## 2022-05-30 MED ORDER — SODIUM CHLORIDE 0.9 % IV BOLUS
500.0000 mL | Freq: Once | INTRAVENOUS | Status: AC
  Administered 2022-05-30: 500 mL via INTRAVENOUS

## 2022-05-30 MED ORDER — MORPHINE SULFATE (PF) 2 MG/ML IV SOLN
2.0000 mg | Freq: Once | INTRAVENOUS | Status: AC
  Administered 2022-05-30: 2 mg via INTRAVENOUS
  Filled 2022-05-30: qty 1

## 2022-05-30 MED ORDER — STERILE WATER FOR INJECTION IV SOLN
INTRAVENOUS | Status: DC
  Filled 2022-05-30 (×10): qty 1000

## 2022-05-30 MED ORDER — HYDROMORPHONE HCL 1 MG/ML IJ SOLN
0.5000 mg | INTRAMUSCULAR | Status: DC | PRN
  Administered 2022-06-01: 0.5 mg via INTRAVENOUS
  Filled 2022-05-30: qty 0.5

## 2022-05-30 MED ORDER — POLYETHYLENE GLYCOL 3350 17 G PO PACK: 17.0000 g | PACK | Freq: Every day | ORAL | Status: DC | PRN

## 2022-05-30 MED ORDER — DICYCLOMINE HCL 10 MG PO CAPS: 10.0000 mg | ORAL_CAPSULE | Freq: Once | ORAL | Status: DC

## 2022-05-30 MED ORDER — FENTANYL CITRATE PF 50 MCG/ML IJ SOSY
50.0000 ug | PREFILLED_SYRINGE | Freq: Once | INTRAMUSCULAR | Status: AC
  Administered 2022-05-30: 50 ug via INTRAVENOUS
  Filled 2022-05-30: qty 1

## 2022-05-30 MED ORDER — ACETAMINOPHEN 650 MG RE SUPP: 650.0000 mg | Freq: Four times a day (QID) | RECTAL | Status: DC | PRN

## 2022-05-30 MED ORDER — SIMETHICONE 80 MG PO CHEW: 80.0000 mg | CHEWABLE_TABLET | Freq: Once | ORAL | Status: DC

## 2022-05-30 MED ORDER — ACETAMINOPHEN 325 MG PO TABS
650.0000 mg | ORAL_TABLET | Freq: Four times a day (QID) | ORAL | Status: DC | PRN
  Administered 2022-06-01: 650 mg via ORAL
  Filled 2022-05-30: qty 2

## 2022-05-30 MED ORDER — PROMETHAZINE HCL 12.5 MG PO TABS
12.5000 mg | ORAL_TABLET | Freq: Four times a day (QID) | ORAL | Status: DC | PRN
  Administered 2022-05-31: 12.5 mg via ORAL
  Filled 2022-05-30 (×2): qty 1

## 2022-05-30 MED ORDER — LABETALOL HCL 5 MG/ML IV SOLN
10.0000 mg | INTRAVENOUS | Status: DC | PRN
  Administered 2022-05-30: 10 mg via INTRAVENOUS
  Filled 2022-05-30: qty 4

## 2022-05-30 NOTE — ED Triage Notes (Deleted)
Patient was previously on dialysis and stopped a couple of years ago. His head has been hurting for several days and was hoping would go away.

## 2022-05-30 NOTE — ED Notes (Signed)
Pt continues to complain of headache and  L chest pain radiating into L arm.  Pt reports quitting dialysis x1 year ago.  Additionally, Pt has a suprapubic catheter, but keeps it locked except at night when he sleeps.

## 2022-05-30 NOTE — ED Notes (Signed)
Lab reports CMP and Trop should result within the next .

## 2022-05-30 NOTE — Progress Notes (Signed)
Brief nephrology note: 82 y/o male with h/o ESRD used to be on HD, apparently he is now off of dialysis for many months presents with not feeling well, CP.  The labs are consistent with ESRD with high BUN, acidosis, hypocalemia etc,anemia. CXR with no pulm edema and K level acceptable.   Plan:  Reportedly he is now amenable to resume dialysis. In this situation, I recommend to consult Surgeon to place HD catheter in order to start dialysis. If unable to place HD cath at [facility] then he needs to come to [facility]. Meantime, start sodium bicarb and IVF for dehydration.  D/w ER provider.   Will follow with full consult note.   Dr. Ronalee Belts.

## 2022-05-30 NOTE — ED Notes (Signed)
Pt refuses a temperature.

## 2022-05-30 NOTE — ED Notes (Signed)
Patient transported to CT 

## 2022-05-30 NOTE — ED Triage Notes (Addendum)
His head has been hurting and pressure in chest feeling "like he needs to burp." His head has been hurting for a couple of years and patient's family member said he was previously on dialysis and stopped a couple of years ago.

## 2022-05-30 NOTE — ED Provider Notes (Signed)
[place] EMERGENCY DEPARTMENT AT [facility] Provider Note   CSN: [id number] Arrival date & time: 05/30/22  1405     History  Chief Complaint  Patient presents with   Headache    Glenn Olson is a 82 y.o. male.  Patient with history of ESRD, chronic indwelling suprapubic catheter presents today with complaints of headache, chest pain, shortness of breath. Patient states that he was on hemodialysis TID until a year ago when he 'got tired of going' and stopped going at all. He has not been dialyzed in approximately 1 year. He also stopped taking all his medications at that time as well. Patients son states that he was initially managing okay, however in the past month he has been decompensating and is really not able to care for himself in his current state. The patient states that he has been more short of breath over the past month and his headache began about 1 week ago. He denies fevers, chills, cough, congestion, nausea, vomiting, diarrhea, or abdominal pain.  The history is provided by the patient. No language interpreter was used.  Headache      Home Medications Prior to Admission medications   Medication Sig Start Date End Date Taking? Authorizing Provider  aspirin EC 81 MG tablet Take 81 mg by mouth daily.    [provider]  colchicine 0.6 MG tablet Take 0.6 mg by mouth daily as needed (gout/knee flare).    [provider]  lidocaine-prilocaine (EMLA) cream Apply 1 application topically. Apply small amount to access sit (AVT) 1 to 2 hours before Dialysis. Cover with occlusive dressing 11/07/20   [provider]  NIFEdipine (ADALAT CC) 30 MG 24 hr tablet Take 30 mg by mouth daily. 01/15/21   [provider]  omeprazole (PRILOSEC) 20 MG capsule Take 20 mg by mouth daily.    [provider]  oxyCODONE-acetaminophen (PERCOCET) 5-325 MG tablet Take 1 tablet by mouth every 6 (six) hours as needed for severe pain. 03/17/21    Rhyne, Ames Coupe, PA-C  simvastatin (ZOCOR) 40 MG tablet Take 40 mg by mouth in the morning.    [provider]      Allergies    Patient has no known allergies.    Review of Systems   Review of Systems  Respiratory:  Positive for shortness of breath.   Cardiovascular:  Positive for chest pain.  Neurological:  Positive for headaches.  All other systems reviewed and are negative.   Physical Exam Updated Vital Signs BP (!) 168/93   Pulse (!) 121   Temp (!) 97.5 F (36.4 C) (Oral)   Resp (!) 21   Ht 5\' 9"  (1.753 m)   Wt 80.3 kg   SpO2 100%   BMI 26.14 kg/m  Physical Exam Vitals and nursing note reviewed.  Constitutional:      General: He is not in acute distress.    Appearance: Normal appearance. He is normal weight. He is not ill-appearing, toxic-appearing or diaphoretic.  HENT:     Head: Normocephalic and atraumatic.     Mouth/Throat:     Mouth: Mucous membranes are dry.  Cardiovascular:     Rate and Rhythm: Tachycardia present.     Heart sounds: Normal heart sounds.  Pulmonary:     Effort: No respiratory distress.     Breath sounds: Normal breath sounds.     Comments: Tachypnea Abdominal:     Palpations: Abdomen is soft.  Musculoskeletal:  General: Normal range of motion.     Cervical back: Normal range of motion.     Right lower leg: No edema.     Left lower leg: No edema.  Skin:    General: Skin is warm and dry.  Neurological:     General: No focal deficit present.     Mental Status: He is alert.  Psychiatric:        Mood and Affect: Mood normal.        Behavior: Behavior normal.     ED Results / Procedures / Treatments   Labs (all labs ordered are listed, but only abnormal results are displayed) Labs Reviewed  CBC WITH DIFFERENTIAL/PLATELET - Abnormal; Notable for the following components:      Result Value   RBC 2.85 (*)    Hemoglobin 8.6 (*)    HCT 25.5 (*)    RDW 15.9 (*)    Neutro Abs 8.3 (*)    Lymphs Abs 0.5 (*)    Abs  Immature Granulocytes 0.14 (*)    All other components within normal limits  COMPREHENSIVE METABOLIC PANEL - Abnormal; Notable for the following components:   Sodium 134 (*)    CO2 <7 (*)    Glucose, Bld 137 (*)    BUN 241 (*)    Creatinine, Ser 13.80 (*)    Calcium 6.0 (*)    AST 12 (*)    GFR, Estimated 3 (*)    All other components within normal limits  TROPONIN I (HIGH SENSITIVITY) - Abnormal; Notable for the following components:   Troponin I (High Sensitivity) 48 (*)    All other components within normal limits  TROPONIN I (HIGH SENSITIVITY)    EKG EKG Interpretation  Date/Time:  Wednesday May 30 2022 14:17:00 EDT Ventricular Rate:  125 PR Interval:    QRS Duration: 144 QT Interval:  384 QTC Calculation: 554 R Axis:   -58 Text Interpretation: Wide QRS tachycardia Left axis deviation Right bundle branch block T wave abnormality, consider inferolateral ischemia Abnormal ECG When compared with ECG of 07-Feb-2021 08:52, Confirmed by Meridee Score [phone]) on 05/30/2022 2:25:27 PM  Radiology CT Head Wo Contrast  Result Date: 05/30/2022 CLINICAL DATA:  Acute onset headache. EXAM: CT HEAD WITHOUT CONTRAST TECHNIQUE: Contiguous axial images were obtained from the base of the skull through the vertex without intravenous contrast. RADIATION DOSE REDUCTION: This exam was performed according to the departmental dose-optimization program which includes automated exposure control, adjustment of the mA and/or kV according to patient size and/or use of iterative reconstruction technique. COMPARISON:  None Available. FINDINGS: Brain: No evidence of intracranial hemorrhage, acute infarction, hydrocephalus, extra-axial collection, or mass lesion/mass effect. Mild diffuse cerebral atrophy noted. Vascular:  No hyperdense vessel or other acute findings. Skull: No evidence of fracture or other significant bone abnormality. Sinuses/Orbits: Near-complete opacification of left maxillary sinus. Other:  None. IMPRESSION: No acute intracranial abnormality. Mild cerebral atrophy. Left maxillary sinus disease. Electronically Signed   By: Danae Orleans M.D.   On: 05/30/2022 16:07   DG Chest Port 1 View  Result Date: 05/30/2022 CLINICAL DATA:  Chest pain, headaches EXAM: PORTABLE CHEST 1 VIEW COMPARISON:  02/06/2021 FINDINGS: Transverse diameter of heart is increased. Central pulmonary vessels are prominent. There are no signs of alveolar pulmonary edema. Increased markings are seen in left lower lung field. Possible calcified granuloma is seen in right lower lung field. Patient's chin is partially obscuring the left apex. There is no pleural effusion or pneumothorax. IMPRESSION: Central  pulmonary vessels are prominent without signs of alveolar pulmonary edema. Increased markings are seen in left lower lung field which may suggest crowding of normal bronchovascular structures or atelectasis/pneumonia. Electronically Signed   By: Ernie Avena M.D.   On: 05/30/2022 14:57    Procedures .Critical Care  Performed by: Silva Bandy, PA-C Authorized by: Silva Bandy, PA-C   Critical care provider statement:    Critical care time (minutes):  45   Critical care start time:  05/30/2022 4:30 PM   Critical care end time:  05/30/2022 5:15 AM   Critical care was necessary to treat or prevent imminent or life-threatening deterioration of the following conditions:  Renal failure and metabolic crisis   Critical care was time spent personally by me on the following activities:  Development of treatment plan with patient or surrogate, discussions with consultants, discussions with primary provider, evaluation of patient's response to treatment, examination of patient, obtaining history from patient or surrogate, ordering and review of laboratory studies, ordering and review of radiographic studies, pulse oximetry, re-evaluation of patient's condition and review of old charts   Care discussed with: admitting provider        Medications Ordered in ED Medications  morphine (PF) 2 MG/ML injection 2 mg (has no administration in time range)  sodium chloride 0.9 % bolus 500 mL (500 mLs Intravenous New Bag/Given 05/30/22 1529)    ED Course/ Medical Decision Making/ A&P Clinical Course as of 05/30/22 1627  Wed May 30, 2022  [id number] 82 year old male brought in by family member for evaluation of headache.  Sounds like he is had a headache for about a week but getting worse.  He is also had a progressive decline over the last month and is ambulating less well.  He was apparently on dialysis up until a year ago when he stopped going.  Does not see a doctor.  He is awake and alert although restless.  Getting lab work and imaging.  Disposition per results of testing. [MB]    Clinical Course User Index [MB] Terrilee Files, MD                             Medical Decision Making Amount and/or Complexity of Data Reviewed Labs: ordered. Radiology: ordered.  Risk Prescription drug management.   This patient is a 82 y.o. male who presents to the ED for concern of headache, chest pain, shortness of breath, this involves an extensive number of treatment options, and is a complaint that carries with it a high risk of complications and morbidity. The emergent differential diagnosis prior to evaluation includes, but is not limited to,  ESRD, electrolyte derangement, ACS/PE, CVA, intracranial tumor, venous sinus thrombosis, migraine, cluster headache, hypertension. This is not an exhaustive differential.   Past Medical History / Co-morbidities / Social History: Was on hemodialysis until 1 year ago when he decided to d/c treatment  Physical Exam: Physical exam performed. The pertinent findings include: dry on exam, tachypneic, tachycardic.  Lab Tests: I ordered, and personally interpreted labs.  The pertinent results include:  hgb 8.6, bicarb <7, BUN 241, creatinine 13.80, calcium 6, GFR 3. Troponin 48 --> 52 likely due to  CKD   Imaging Studies: I ordered imaging studies including CXR, CT head. I independently visualized and interpreted imaging which showed   CXR:  Central pulmonary vessels are prominent without signs of alveolar pulmonary edema.   Increased markings are seen in left lower  lung field which may suggest crowding of normal bronchovascular structures or atelectasis/pneumonia.  CT head:  No acute intracranial abnormality.   Mild cerebral atrophy.   Left maxillary sinus disease.  I agree with the radiologist interpretation.   Cardiac Monitoring:  The patient was maintained on a cardiac monitor.  My attending physician Dr. Charm Barges viewed and interpreted the cardiac monitored which showed an underlying rhythm of: no STEMI. I agree with this interpretation.   Medications: I ordered medication including fluids, morphine, fentanyl, bicarb  for dehydration, pain, acidosis. Reevaluation of the patient after these medicines showed that the patient improved. I have reviewed the patients home medicines and have made adjustments as needed.  Consultations Obtained: I requested consultation with the nephrology on call Dr. Ronalee Belts, general surgery on call Dr. Robyne Peers, vascular surgery on call Dr. Myra Gianotti,  and discussed lab and imaging findings as well as pertinent plan - they recommend: Nephrology recommends tunneled catheter placement for hemodialysis, can wait until tomorrow if needed. Start bicarb drip at 150 ml/hour in water. General surgery at [facility] does not place tunneled catheters, patient will need to go to [facility]. Vascular surgery will place catheter tomorrow likely before lunchtime. Patient made NPO at midnight   Disposition: After consideration of the diagnostic results and the patients response to treatment, I feel that patient will require admission for hemodialysis and catheter placement. Discussed with patient who is on board with this plan. Will need to go to [facility] for  catheter placement. Patient is understanding and amenable with plan.  Discussed patient with hospitalist who agrees to admit  I discussed this case with my attending physician Dr. Charm Barges who cosigned this note including patient's presenting symptoms, physical exam, and planned diagnostics and interventions. Attending physician stated agreement with plan or made changes to plan which were implemented.    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

## 2022-05-30 NOTE — Assessment & Plan Note (Addendum)
Reports been able to void up until today.  - Suprapubic cath. - bladder scan- .

## 2022-05-30 NOTE — H&P (Addendum)
History and Physical    Glenn Olson ONG:[id number] DOB: 17-Mar-1940 DOA: 05/30/2022  PCP: Benita Stabile, MD   Patient coming from: Home  I have personally briefly reviewed patient's old medical records in [facility]  Chief Complaint: Chest pain, headache  HPI: Glenn Olson is a 82 y.o. male with medical history significant for ESRD, neurogenic atonic bladder.  Patient presented to the ED with complaints of headache, and chest pain of ~ 1 week..  Patient was going for dialysis until about a year ago when he made the decision to stop going.  Reports generalized headache, feeling pressure in his head, difficulty breathing with exertion.  He reports chest pressure all around his chest.  1 episode of vomiting today.  Chronic poor oral intake.  No diarrhea.  Reports he has been making urine until today, he feels like he wants to urinate but he is unable to void.  ED Course: Temperature 97.5.  Heart rate 118-121.  Respiratory rate 21- 30.  Blood pressure systolic 120s to 440N.  O2 sats 100% on room air.  Chest x-ray -troponin 5.  Pulm exam without signs of alveolar pulmonary edema, increased left lower lung field markings-crowding of normal bronchovascular structures or atelectasis/pneumonia. CO2 less than 7.  BUN of 241.  Potassium 4.5.  Creatinine 13.8. Troponin 48 > 52.  bolus given. EDP talked to nephrologist Dr. Ronalee Belts recommended placing HD cath here by gen surg but if unable to, admit to [facility].  Start sodium bicarb and IV fluids for hydration.  Review of Systems: As per HPI all other systems reviewed and negative.  Past Medical History:  Diagnosis Date   Anemia    Chronic kidney disease    Dysrhythmia    GERD (gastroesophageal reflux disease)    Neurogenic bladder     Past Surgical History:  Procedure Laterality Date   AV FISTULA PLACEMENT Left 09/27/2020   Procedure: LEFT ARM ARTERIOVENOUS GRAFT PLACEMENT USING GORE-TEX 4-7MM X 45CM  VASCULAR GRAFT;  Surgeon:  Larina Earthly, MD;  Location: [facility];  Service: Vascular;  Laterality: Left;   CAPD INSERTION N/A 03/17/2021   Procedure: ATTEMPTED INSERTION OF LAPAROSCOPIC CONTINUOUS AMBULATORY PERITONEAL DIALYSIS (CAPD) CATHETER;  Surgeon: Maeola Harman, MD;  Location: [facility] OR;  Service: Vascular;  Laterality: N/A;   CYSTOURETHROSCOPY     INSERTION OF DIALYSIS CATHETER Right 09/27/2020   Procedure: INSERTION OF PALINDROME TUNNELED DIALYSIS CATHETER 14.43f X 23CM;  Surgeon: Larina Earthly, MD;  Location: [facility];  Service: Vascular;  Laterality: Right;   REMOVAL OF A DIALYSIS CATHETER N/A 12/06/2020   Procedure: MINOR REMOVAL OF A DIALYSIS CATHETER;  Surgeon: Larina Earthly, MD;  Location: [facility];  Service: Vascular;  Laterality: N/A;   SUPRAPUBIC CATHETER PLACEMENT       reports that he has never smoked. He has never used smokeless tobacco. He reports current alcohol use. He reports that he does not use drugs.  No Known Allergies  Family history of hypertension.  Prior to Admission medications   Medication Sig Start Date End Date Taking? Authorizing Provider  aspirin EC 81 MG tablet Take 81 mg by mouth daily.    [provider]  colchicine 0.6 MG tablet Take 0.6 mg by mouth daily as needed (gout/knee flare).    [provider]  lidocaine-prilocaine (EMLA) cream Apply 1 application topically. Apply small amount to access sit (AVT) 1 to 2 hours before Dialysis. Cover with occlusive dressing 11/07/20   [provider]  NIFEdipine (ADALAT CC) 30 MG 24 hr tablet Take 30 mg by mouth daily. 01/15/21   [provider]  omeprazole (PRILOSEC) 20 MG capsule Take 20 mg by mouth daily.    [provider]  oxyCODONE-acetaminophen (PERCOCET) 5-325 MG tablet Take 1 tablet by mouth every 6 (six) hours as needed for severe pain. 03/17/21   Rhyne, Ames Coupe, PA-C  simvastatin (ZOCOR) 40 MG tablet Take 40 mg by mouth in the morning.    [provider]    Physical  Exam: Vitals:   05/30/22 1530 05/30/22 1604 05/30/22 1645 05/30/22 1652  BP: (!) 168/93  (!) 177/85   Pulse: (!) 41 (!) 121 (!) 120 (!) 118  Resp: (!) 24 (!) 21 (!) 22 (!) 23  Temp:      TempSrc:      SpO2: 100% 100% 100% 100%  Weight:      Height:        Constitutional: calm Vitals:   05/30/22 1530 05/30/22 1604 05/30/22 1645 05/30/22 1652  BP: (!) 168/93  (!) 177/85   Pulse: (!) 41 (!) 121 (!) 120 (!) 118  Resp: (!) 24 (!) 21 (!) 22 (!) 23  Temp:      TempSrc:      SpO2: 100% 100% 100% 100%  Weight:      Height:       Eyes: PERRL, lids and conjunctivae normal ENMT: Mucous membranes are dry  Neck: normal, supple, no masses, no thyromegaly Respiratory: clear to auscultation bilaterally, no wheezing, no crackles. Normal respiratory effort. No accessory muscle use.  Cardiovascular: Tachycardic, regular rate and rhythm, no murmurs / rubs / gallops. No extremity edema.  Abdomen: no tenderness, no masses palpated. No hepatosplenomegaly. Bowel sounds positive.  Musculoskeletal: no clubbing / cyanosis. No joint deformity upper and lower extremities.  Skin: Skin appears very dry.  Previous HD access to left forearm Neurologic: No facial asymmetry, no aphasia, moving extremities spontaneously Psychiatric: Normal judgment and insight. Alert and oriented x 3. Normal mood.   Labs on Admission: I have personally reviewed following labs and imaging studies  CBC: Recent Labs  Lab 05/30/22 1445  WBC 9.3  NEUTROABS 8.3*  HGB 8.6*  HCT 25.5*  MCV 89.5  PLT 226   Basic Metabolic Panel: Recent Labs  Lab 05/30/22 1445  NA 134*  K 4.5  CL 104  CO2 <7*  GLUCOSE 137*  BUN 241*  CREATININE 13.80*  CALCIUM 6.0*   Liver Function Tests: Recent Labs  Lab 05/30/22 1445  AST 12*  ALT 16  ALKPHOS 103  BILITOT 0.7  PROT 7.9  ALBUMIN 4.1    Radiological Exams on Admission: CT Head Wo Contrast  Result Date: 05/30/2022 CLINICAL DATA:  Acute onset headache. EXAM: CT HEAD  WITHOUT CONTRAST TECHNIQUE: Contiguous axial images were obtained from the base of the skull through the vertex without intravenous contrast. RADIATION DOSE REDUCTION: This exam was performed according to the departmental dose-optimization program which includes automated exposure control, adjustment of the mA and/or kV according to patient size and/or use of iterative reconstruction technique. COMPARISON:  None Available. FINDINGS: Brain: No evidence of intracranial hemorrhage, acute infarction, hydrocephalus, extra-axial collection, or mass lesion/mass effect. Mild diffuse cerebral atrophy noted. Vascular:  No hyperdense vessel or other acute findings. Skull: No evidence of fracture or other significant bone abnormality. Sinuses/Orbits: Near-complete opacification of left maxillary sinus. Other: None. IMPRESSION: No acute intracranial abnormality. Mild cerebral atrophy. Left maxillary sinus disease. Electronically Signed  By: Danae Orleans M.D.   On: 05/30/2022 16:07   DG Chest Port 1 View  Result Date: 05/30/2022 CLINICAL DATA:  Chest pain, headaches EXAM: PORTABLE CHEST 1 VIEW COMPARISON:  02/06/2021 FINDINGS: Transverse diameter of heart is increased. Central pulmonary vessels are prominent. There are no signs of alveolar pulmonary edema. Increased markings are seen in left lower lung field. Possible calcified granuloma is seen in right lower lung field. Patient's chin is partially obscuring the left apex. There is no pleural effusion or pneumothorax. IMPRESSION: Central pulmonary vessels are prominent without signs of alveolar pulmonary edema. Increased markings are seen in left lower lung field which may suggest crowding of normal bronchovascular structures or atelectasis/pneumonia. Electronically Signed   By: Ernie Avena M.D.   On: 05/30/2022 14:57    EKG: Independently reviewed.  Sinus tachycardia, rate 125, QTc 554.  Old RBBB, LAFP.  Assessment/Plan Principal Problem:   Uremia Active  Problems:   Metabolic acidosis   Chest pain   Atonic neurogenic bladder   ESRD on hemodialysis (HCC)   Assessment and Plan: * Uremia Presenting with headache, chest pressure, vomiting, dyspnea.  BUN markedly elevated at 241 metabolic acidosis with Serum bicarb of less than 7.  ESRD patient, but has not gotten dialyzed in at least a year.  Potassium 4.5.  Blood pressure 120s to 170s.  Patient now ready to resume dialysis. - EDP consulted nephrology, recommended HD cath to be placed by general surgery, if unable to, admit to [facility].  Start bicarb drip -Patient will be admitted to [facility], vascular surgeon Dr. Myra Gianotti to see patient for tunneled HD cath placement -N.p.o. midnight -500 mill bolus normal saline given.  Continue sodium bicarb at 100 cc/h -No history of hypertension, blood pressure elevated systolic 120s to 161W, as needed labetalol for now for systolic greater than 170.  Chest pain Chest pain in the setting of marked uremia.  Nonspecific and generalized chest pain over the past week.  Improved with 2 mg morphine given in ED.  Troponin 48 > 2.  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  With sinus tachycardia, no ST or T wave changes. - Re-evaluate after HD  Atonic neurogenic bladder Reports been able to void up until today.  - Suprapubic cath. - bladder scan- .   DVT prophylaxis: SCDs for now pending vascular surgery  Code Status:  DNR-open with patient's and son Cytogeneticist at bedside Family Communication: Son Estate agent at bedside and Carin Primrose are both primary decision maker. Disposition Plan: > 2 days Consults called:  Nephrology, vascular surgery Admission status: Inpt Tele  I certify that at the point of admission it is my clinical judgment that the patient will require inpatient hospital care spanning beyond 2 midnights from the point of admission due to high intensity of service, high risk for further deterioration and high frequency of surveillance required.     Author: Onnie Boer, MD 05/30/2022 8:00 PM  For on call review [email or web address].

## 2022-05-30 NOTE — Assessment & Plan Note (Signed)
Chest pain in the setting of marked uremia.  Nonspecific and generalized chest pain over the past week.  Improved with 2 mg morphine given in ED.  Troponin 48 > 2.  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  With sinus tachycardia, no ST or T wave changes. - Re-evaluate after HD

## 2022-05-30 NOTE — Assessment & Plan Note (Addendum)
Presenting with headache, chest pressure, vomiting, dyspnea.  BUN markedly elevated at 241 metabolic acidosis with Serum bicarb of less than 7.  ESRD patient, but has not gotten dialyzed in at least a year.  Potassium 4.5.  Blood pressure 120s to 170s.  Patient now ready to resume dialysis. - EDP consulted nephrology, recommended HD cath to be placed by general surgery, if unable to, admit to [facility].  Start bicarb drip -Patient will be admitted to [facility], vascular surgeon Dr. Myra Gianotti to see patient for tunneled HD cath placement -N.p.o. midnight -500 mill bolus normal saline given.  Continue sodium bicarb at 100 cc/h -No history of hypertension, blood pressure elevated systolic 120s to 161W, as needed labetalol for now for systolic greater than 170.

## 2022-05-31 ENCOUNTER — Other Ambulatory Visit: Payer: Self-pay

## 2022-05-31 ENCOUNTER — Inpatient Hospital Stay (HOSPITAL_COMMUNITY): Payer: Medicare Other | Admitting: Certified Registered"

## 2022-05-31 ENCOUNTER — Encounter (HOSPITAL_COMMUNITY)
Admission: EM | Disposition: A | Discharge status: Rehabilitation Facility | Payer: Self-pay | Source: Home / Self Care | Attending: Internal Medicine

## 2022-05-31 ENCOUNTER — Inpatient Hospital Stay (HOSPITAL_COMMUNITY): Payer: Medicare Other

## 2022-05-31 DIAGNOSIS — D631 Anemia in chronic kidney disease: Secondary | ICD-10-CM | POA: Diagnosis not present

## 2022-05-31 DIAGNOSIS — I4891 Unspecified atrial fibrillation: Secondary | ICD-10-CM | POA: Diagnosis not present

## 2022-05-31 DIAGNOSIS — N19 Unspecified kidney failure: Secondary | ICD-10-CM | POA: Diagnosis not present

## 2022-05-31 DIAGNOSIS — N186 End stage renal disease: Secondary | ICD-10-CM | POA: Diagnosis not present

## 2022-05-31 DIAGNOSIS — N185 Chronic kidney disease, stage 5: Secondary | ICD-10-CM | POA: Diagnosis not present

## 2022-05-31 DIAGNOSIS — N312 Flaccid neuropathic bladder, not elsewhere classified: Secondary | ICD-10-CM | POA: Diagnosis not present

## 2022-05-31 DIAGNOSIS — R079 Chest pain, unspecified: Secondary | ICD-10-CM | POA: Diagnosis not present

## 2022-05-31 DIAGNOSIS — Z978 Presence of other specified devices: Secondary | ICD-10-CM

## 2022-05-31 DIAGNOSIS — E872 Acidosis, unspecified: Secondary | ICD-10-CM | POA: Diagnosis not present

## 2022-05-31 HISTORY — PX: INSERTION OF DIALYSIS CATHETER: SHX1324

## 2022-05-31 LAB — BASIC METABOLIC PANEL
Anion gap: 25 — ABNORMAL HIGH (ref 5–15)
Anion gap: 30 — ABNORMAL HIGH (ref 5–15)
BUN: 248 mg/dL — ABNORMAL HIGH (ref 8–23)
BUN: 257 mg/dL — ABNORMAL HIGH (ref 8–23)
CO2: 8 mmol/L — ABNORMAL LOW (ref 22–32)
CO2: 7 mmol/L — ABNORMAL LOW (ref 22–32)
Calcium: 5.6 mg/dL — CL (ref 8.9–10.3)
Calcium: 5.9 mg/dL — CL (ref 8.9–10.3)
Chloride: 104 mmol/L (ref 98–111)
Chloride: 102 mmol/L (ref 98–111)
Creatinine, Ser: 13.75 mg/dL — ABNORMAL HIGH (ref 0.61–1.24)
Creatinine, Ser: 13.97 mg/dL — ABNORMAL HIGH (ref 0.61–1.24)
GFR, Estimated: 3 mL/min — ABNORMAL LOW (ref >60)
GFR, Estimated: 3 mL/min — ABNORMAL LOW (ref >60)
Glucose, Bld: 83 mg/dL (ref 70–99)
Glucose, Bld: 77 mg/dL (ref 70–99)
Potassium: 4.2 mmol/L (ref 3.5–5.1)
Potassium: 4 mmol/L (ref 3.5–5.1)
Sodium: 137 mmol/L (ref 135–145)
Sodium: 139 mmol/L (ref 135–145)

## 2022-05-31 LAB — POCT I-STAT, CHEM 8
BUN: >130 mg/dL — ABNORMAL HIGH (ref 8–23)
Calcium, Ion: 0.73 mmol/L — CL (ref 1.15–1.40)
Chloride: 106 mmol/L (ref 98–111)
Creatinine, Ser: 15.5 mg/dL — ABNORMAL HIGH (ref 0.61–1.24)
Glucose, Bld: 74 mg/dL (ref 70–99)
HCT: 22 % — ABNORMAL LOW (ref 39.0–52.0)
Hemoglobin: 7.5 g/dL — ABNORMAL LOW (ref 13.0–17.0)
Potassium: 4.1 mmol/L (ref 3.5–5.1)
Sodium: 137 mmol/L (ref 135–145)
TCO2: 11 mmol/L — ABNORMAL LOW (ref 22–32)

## 2022-05-31 LAB — CBC
HCT: 21.7 % — ABNORMAL LOW (ref 39.0–52.0)
Hemoglobin: 7.3 g/dL — ABNORMAL LOW (ref 13.0–17.0)
MCH: 29.3 pg (ref 26.0–34.0)
MCHC: 33.6 g/dL (ref 30.0–36.0)
MCV: 87.1 fL (ref 80.0–100.0)
Platelets: 167 10*3/uL (ref 150–400)
RBC: 2.49 MIL/uL — ABNORMAL LOW (ref 4.22–5.81)
RDW: 15.7 % — ABNORMAL HIGH (ref 11.5–15.5)
WBC: 6.6 10*3/uL (ref 4.0–10.5)
nRBC: 0 % (ref 0.0–0.2)

## 2022-05-31 LAB — HEPATITIS B SURFACE ANTIGEN: Hepatitis B Surface Ag: NONREACTIVE

## 2022-05-31 LAB — GLUCOSE, CAPILLARY: Glucose-Capillary: 64 mg/dL — ABNORMAL LOW (ref 70–99)

## 2022-05-31 SURGERY — INSERTION OF DIALYSIS CATHETER
Anesthesia: General | Site: Neck | Laterality: Right

## 2022-05-31 MED ORDER — ORAL CARE MOUTH RINSE: 15.0000 mL | Freq: Once | OROMUCOSAL | Status: AC

## 2022-05-31 MED ORDER — HEPARIN 6000 UNIT IRRIGATION SOLUTION
Status: AC
  Filled 2022-05-31: qty 500

## 2022-05-31 MED ORDER — CALCIUM GLUCONATE-NACL 1-0.675 GM/50ML-% IV SOLN
1.0000 g | Freq: Once | INTRAVENOUS | Status: AC
  Administered 2022-05-31: 1000 mg via INTRAVENOUS
  Filled 2022-05-31 (×2): qty 50

## 2022-05-31 MED ORDER — CEFAZOLIN SODIUM-DEXTROSE 2-4 GM/100ML-% IV SOLN
INTRAVENOUS | Status: AC
  Filled 2022-05-31: qty 100

## 2022-05-31 MED ORDER — HEPARIN SODIUM (PORCINE) 1000 UNIT/ML IJ SOLN
INTRAMUSCULAR | Status: AC
  Administered 2022-05-31: 1000 [IU] via INTRAVENOUS_CENTRAL
  Filled 2022-05-31: qty 1

## 2022-05-31 MED ORDER — CEFAZOLIN SODIUM-DEXTROSE 2-4 GM/100ML-% IV SOLN
2.0000 g | Freq: Once | INTRAVENOUS | Status: AC
  Administered 2022-05-31: 2 g via INTRAVENOUS

## 2022-05-31 MED ORDER — HEPARIN 6000 UNIT IRRIGATION SOLUTION
Status: DC | PRN
  Administered 2022-05-31: 1

## 2022-05-31 MED ORDER — FENTANYL CITRATE (PF) 100 MCG/2ML IJ SOLN
25.0000 ug | INTRAMUSCULAR | Status: DC | PRN
  Administered 2022-05-31: 50 ug via INTRAVENOUS

## 2022-05-31 MED ORDER — HEPARIN SODIUM (PORCINE) 1000 UNIT/ML IJ SOLN
INTRAMUSCULAR | Status: AC
  Filled 2022-05-31: qty 10

## 2022-05-31 MED ORDER — SUGAMMADEX SODIUM 200 MG/2ML IV SOLN
INTRAVENOUS | Status: DC | PRN
  Administered 2022-05-31: 200 mg via INTRAVENOUS

## 2022-05-31 MED ORDER — HEPARIN SODIUM (PORCINE) 1000 UNIT/ML IJ SOLN
INTRAMUSCULAR | Status: DC | PRN
  Administered 2022-05-31: 3200 [IU]

## 2022-05-31 MED ORDER — LIDOCAINE 2% (20 MG/ML) 5 ML SYRINGE
INTRAMUSCULAR | Status: AC
  Filled 2022-05-31: qty 5

## 2022-05-31 MED ORDER — PROPOFOL 10 MG/ML IV BOLUS
INTRAVENOUS | Status: DC | PRN
  Administered 2022-05-31: 80 mg via INTRAVENOUS

## 2022-05-31 MED ORDER — FENTANYL CITRATE (PF) 250 MCG/5ML IJ SOLN
INTRAMUSCULAR | Status: DC | PRN
  Administered 2022-05-31: 50 ug via INTRAVENOUS

## 2022-05-31 MED ORDER — FENTANYL CITRATE (PF) 100 MCG/2ML IJ SOLN
INTRAMUSCULAR | Status: AC
  Filled 2022-05-31: qty 2

## 2022-05-31 MED ORDER — FENTANYL CITRATE (PF) 250 MCG/5ML IJ SOLN
INTRAMUSCULAR | Status: AC
  Filled 2022-05-31: qty 5

## 2022-05-31 MED ORDER — CALCIUM GLUCONATE 10 % IV SOLN
INTRAVENOUS | Status: DC | PRN
  Administered 2022-05-31: 1 g via INTRAVENOUS

## 2022-05-31 MED ORDER — ROCURONIUM BROMIDE 10 MG/ML (PF) SYRINGE
PREFILLED_SYRINGE | INTRAVENOUS | Status: DC | PRN
  Administered 2022-05-31: 40 mg via INTRAVENOUS

## 2022-05-31 MED ORDER — SODIUM CHLORIDE 0.9 % IV SOLN: INTRAVENOUS | Status: DC

## 2022-05-31 MED ORDER — PHENYLEPHRINE 80 MCG/ML (10ML) SYRINGE FOR IV PUSH (FOR BLOOD PRESSURE SUPPORT)
PREFILLED_SYRINGE | INTRAVENOUS | Status: DC | PRN
  Administered 2022-05-31 (×2): 80 ug via INTRAVENOUS

## 2022-05-31 MED ORDER — CHLORHEXIDINE GLUCONATE 0.12 % MT SOLN
15.0000 mL | Freq: Once | OROMUCOSAL | Status: AC
  Administered 2022-05-31: 15 mL via OROMUCOSAL

## 2022-05-31 MED ORDER — LIDOCAINE HCL (PF) 1 % IJ SOLN
INTRAMUSCULAR | Status: AC
  Filled 2022-05-31: qty 30

## 2022-05-31 MED ORDER — 0.9 % SODIUM CHLORIDE (POUR BTL) OPTIME
TOPICAL | Status: DC | PRN
  Administered 2022-05-31: 1000 mL

## 2022-05-31 MED ORDER — ONDANSETRON HCL 4 MG/2ML IJ SOLN
INTRAMUSCULAR | Status: DC | PRN
  Administered 2022-05-31: 4 mg via INTRAVENOUS

## 2022-05-31 MED ORDER — CHLORHEXIDINE GLUCONATE CLOTH 2 % EX PADS
6.0000 | MEDICATED_PAD | Freq: Every day | CUTANEOUS | Status: DC
  Administered 2022-06-01 – 2022-06-18 (×18): 6 via TOPICAL

## 2022-05-31 MED ORDER — ROCURONIUM BROMIDE 10 MG/ML (PF) SYRINGE
PREFILLED_SYRINGE | INTRAVENOUS | Status: AC
  Filled 2022-05-31: qty 10

## 2022-05-31 MED ORDER — PROPOFOL 10 MG/ML IV BOLUS
INTRAVENOUS | Status: AC
  Filled 2022-05-31: qty 20

## 2022-05-31 MED ORDER — VASOPRESSIN 20 UNIT/ML IV SOLN
INTRAVENOUS | Status: AC
  Filled 2022-05-31: qty 1

## 2022-05-31 MED ORDER — LORAZEPAM 2 MG/ML IJ SOLN
0.5000 mg | Freq: Once | INTRAMUSCULAR | Status: AC | PRN
  Administered 2022-05-31: 0.5 mg via INTRAVENOUS
  Filled 2022-05-31: qty 1

## 2022-05-31 MED ORDER — LORAZEPAM 2 MG/ML IJ SOLN
0.5000 mg | Freq: Once | INTRAMUSCULAR | Status: AC
  Administered 2022-05-31: 0.5 mg via INTRAVENOUS
  Filled 2022-05-31: qty 1

## 2022-05-31 MED ORDER — LIDOCAINE 2% (20 MG/ML) 5 ML SYRINGE
INTRAMUSCULAR | Status: DC | PRN
  Administered 2022-05-31: 60 mg via INTRAVENOUS

## 2022-05-31 SURGICAL SUPPLY — 44 items
ADH SKN CLS APL DERMABOND .7 (GAUZE/BANDAGES/DRESSINGS) ×1
BAG COUNTER SPONGE SURGICOUNT (BAG) ×1 IMPLANT
BAG DECANTER FOR FLEXI CONT (MISCELLANEOUS) ×1 IMPLANT
BAG SPNG CNTER NS LX DISP (BAG) ×1
BIOPATCH RED 1 DISK 7.0 (GAUZE/BANDAGES/DRESSINGS) ×1 IMPLANT
CATH PALINDROME-P 19CM W/VT (CATHETERS) IMPLANT
CATH PALINDROME-P 23CM W/VT (CATHETERS) IMPLANT
CATH PALINDROME-P 28CM W/VT (CATHETERS) IMPLANT
CATH STRAIGHT 5FR 65CM (CATHETERS) IMPLANT
COVER DOME SNAP 22 D (MISCELLANEOUS) IMPLANT
COVER PROBE W GEL 5X96 (DRAPES) ×1 IMPLANT
COVER SURGICAL LIGHT HANDLE (MISCELLANEOUS) ×1 IMPLANT
DERMABOND ADVANCED .7 DNX12 (GAUZE/BANDAGES/DRESSINGS) ×1 IMPLANT
DRAPE C-ARM 42X72 X-RAY (DRAPES) ×1 IMPLANT
DRAPE CHEST BREAST 15X10 FENES (DRAPES) ×1 IMPLANT
DRSG COVADERM 4X6 (GAUZE/BANDAGES/DRESSINGS) IMPLANT
GAUZE 4X4 16PLY ~~LOC~~+RFID DBL (SPONGE) ×1 IMPLANT
GLOVE BIOGEL PI IND STRL 8 (GLOVE) ×1 IMPLANT
GOWN STRL REUS W/ TWL LRG LVL3 (GOWN DISPOSABLE) ×2 IMPLANT
GOWN STRL REUS W/TWL 2XL LVL3 (GOWN DISPOSABLE) ×2 IMPLANT
GOWN STRL REUS W/TWL LRG LVL3 (GOWN DISPOSABLE) ×2
KIT BASIN OR (CUSTOM PROCEDURE TRAY) ×1 IMPLANT
KIT PALINDROME-P 55CM (CATHETERS) IMPLANT
KIT TURNOVER KIT B (KITS) ×1 IMPLANT
NDL 18GX1X1/2 (RX/OR ONLY) (NEEDLE) ×1 IMPLANT
NDL HYPO 25GX1X1/2 BEV (NEEDLE) ×1 IMPLANT
NEEDLE 18GX1X1/2 (RX/OR ONLY) (NEEDLE) ×1 IMPLANT
NEEDLE HYPO 25GX1X1/2 BEV (NEEDLE) ×1 IMPLANT
NS IRRIG 1000ML POUR BTL (IV SOLUTION) ×1 IMPLANT
PACK BASIC III (CUSTOM PROCEDURE TRAY) ×1
PACK SRG BSC III STRL LF ECLPS (CUSTOM PROCEDURE TRAY) ×1 IMPLANT
PAD ARMBOARD 7.5X6 YLW CONV (MISCELLANEOUS) ×2 IMPLANT
SET MICROPUNCTURE 5F STIFF (MISCELLANEOUS) IMPLANT
SPIKE FLUID TRANSFER (MISCELLANEOUS) ×1 IMPLANT
SUT ETHILON 3 0 PS 1 (SUTURE) ×1 IMPLANT
SUT MNCRL AB 4-0 PS2 18 (SUTURE) ×1 IMPLANT
SYR 10ML LL (SYRINGE) ×1 IMPLANT
SYR 20ML LL LF (SYRINGE) ×2 IMPLANT
SYR 5ML LL (SYRINGE) ×1 IMPLANT
SYR CONTROL 10ML LL (SYRINGE) ×1 IMPLANT
TOWEL GREEN STERILE (TOWEL DISPOSABLE) ×1 IMPLANT
TOWEL GREEN STERILE FF (TOWEL DISPOSABLE) ×1 IMPLANT
WATER STERILE IRR 1000ML POUR (IV SOLUTION) ×1 IMPLANT
WIRE AMPLATZ SS-J .035X180CM (WIRE) IMPLANT

## 2022-05-31 NOTE — Progress Notes (Signed)
Patient to OR at this time

## 2022-05-31 NOTE — Progress Notes (Signed)
Telemetry has called to inquire regarding the patient this patient. A few minutes after connecting the leads, he removes them. He is confused and restless and will not follow instructions and advise to keep the leads on for continuous tele monitoring.

## 2022-05-31 NOTE — Progress Notes (Signed)
   05/31/22 2240  Neurological  Level of Consciousness Alert  Orientation Level Oriented to person  RUE Motor Strength 4  LUE Motor Strength 4  RLE Motor Strength 4  LLE Motor Strength 4  Additional Neurological Comments Pt is calm, intermittent confusion  Respiratory  Respiratory Pattern Regular  Chest Assessment Chest expansion symmetrical  Bilateral Breath Sounds Clear  R Upper  Breath Sounds Clear  L Upper Breath Sounds Clear  R Lower Breath Sounds Clear  L Lower Breath Sounds Clear  Cardiac  Pulse Other (Comment)  ECG Monitor Yes  Cardiac Rhythm ST  Integumentary  Integumentary (WDL) WDL  Skin Color Appropriate for ethnicity  Skin Integrity Intact  Urine Characteristics  Urine Color Amber  Urine Appearance Clear  Psychosocial  Patient Behaviors Calm

## 2022-05-31 NOTE — Progress Notes (Signed)
CRITICAL RESULT PROVIDER NOTIFICATION  Test performed and critical result:  Istat, iCA 0.73  Date and time result received:  05/31/22 11:17  Provider name/title: Dr. Nance Pew  Date and time provider notified: 05/31/22 11:20  Date and time provider responded: 05/31/22 11:23  Provider response:At bedside and See new orders  BMET sent, pt to receive more calcium gluconate during surgery.

## 2022-05-31 NOTE — Transfer of Care (Signed)
Immediate Anesthesia Transfer of Care Note  Patient: Glenn Olson  Procedure(s) Performed: INSERTION OF DIALYSIS CATHETER (Right: Neck)  Patient Location: PACU  Anesthesia Type:General  Level of Consciousness: drowsy and patient cooperative  Airway & Oxygen Therapy: Patient Spontanous Breathing and Patient connected to face mask oxygen  Post-op Assessment: Report given to RN, Post -op Vital signs reviewed and stable, and Patient moving all extremities X 4  Post vital signs: Reviewed and stable  Last Vitals:  Vitals Value Taken Time  BP 163/94 05/31/22 1240  Temp    Pulse 124 05/31/22 1243  Resp 16 05/31/22 1243  SpO2 97 % 05/31/22 1243  Vitals shown include unvalidated device data.  Last Pain:  Vitals:   05/31/22 0723  TempSrc: Oral  PainSc:          Complications: No notable events documented.

## 2022-05-31 NOTE — Consult Note (Signed)
[facility]  Patient name: Glenn Olson MRN: [id number] DOB: [birth year] Sex: male   REQUESTING PROVIDER:   Hospital service   REASON FOR CONSULT:    Dialysis access  HISTORY OF PRESENT ILLNESS:   Glenn Olson is a 82 y.o. male, who was transferred to [facility] for dialysis catheter placement to initiate dialysis.  He has been having headache and chest pain for a week.  This has been associated with poor oral intake nausea and vomiting.  At [facility], the decision was made to reinitiate dialysis.  He is sent here for catheter placement.  Patient has been on dialysis in the past however stopped it about a year ago.  He does not have any access currently.  The patient has neurogenic bladder with a suprapubic catheter.  Previously he has had a left forearm graft.  He has a history of colon resection about 10 years ago.  He went for an attempted peritoneal dialysis catheter over a year ago however there was significant adhesions and so this was aborted.  PAST MEDICAL HISTORY    Past Medical History:  Diagnosis Date   Anemia    Chronic kidney disease    Dysrhythmia    GERD (gastroesophageal reflux disease)    Neurogenic bladder      FAMILY HISTORY   History reviewed. No pertinent family history.  SOCIAL HISTORY:   Social History   Socioeconomic History   Marital status: Divorced    Spouse name: Not on file   Number of children: Not on file   Years of education: Not on file   Highest education level: Not on file  Occupational History   Not on file  Tobacco Use   Smoking status: Never   Smokeless tobacco: Never  Vaping Use   Vaping Use: Never used  Substance and Sexual Activity   Alcohol use: Yes    Comment: patient states he is a social drinker   Drug use: No   Sexual activity: Not on file  Other Topics Concern   Not on file  Social History Narrative   Not on file   Social Determinants  of Health   Financial Resource Strain: Not on file  Food Insecurity: Not on file  Transportation Needs: Not on file  Physical Activity: Not on file  Stress: Not on file  Social Connections: Not on file  Intimate Partner Violence: Not on file    ALLERGIES:    No Known Allergies  CURRENT MEDICATIONS:    Current Facility-Administered Medications  Medication Dose Route Frequency Provider Last Rate Last Admin   acetaminophen (TYLENOL) tablet 650 mg  650 mg Oral Q6H PRN Emokpae, Ejiroghene E, MD       Or   acetaminophen (TYLENOL) suppository 650 mg  650 mg Rectal Q6H PRN Emokpae, Ejiroghene E, MD       calcium gluconate 1 g/ 50 mL sodium chloride IVPB  1 g Intravenous Once Pokhrel, Laxman, MD       HYDROmorphone (DILAUDID) injection 0.5 mg  0.5 mg Intravenous Q4H PRN Emokpae, Ejiroghene E, MD       labetalol (NORMODYNE) injection 10 mg  10 mg Intravenous Q2H PRN Emokpae, Ejiroghene E, MD   10 mg at 05/30/22 2045   polyethylene glycol (MIRALAX / GLYCOLAX) packet 17 g  17 g Oral Daily PRN Emokpae, Ejiroghene E, MD       promethazine (PHENERGAN) tablet 12.5 mg  12.5 mg Oral Q6H PRN Emokpae, Heloise Beecham, MD  12.5 mg at 05/31/22 0507   sodium bicarbonate 150 mEq in sterile water 1,150 mL infusion   Intravenous Continuous Emokpae, Ejiroghene E, MD 100 mL/hr at 05/31/22 0535 New Bag at 05/31/22 0535    REVIEW OF SYSTEMS:   [X]  denotes positive finding, [ ]  denotes negative finding Cardiac  Comments:  Chest pain or chest pressure: x   Shortness of breath upon exertion: x   Short of breath when lying flat:    Irregular heart rhythm:        Vascular    Pain in calf, thigh, or hip brought on by ambulation:    Pain in feet at night that wakes you up from your sleep:     Blood clot in your veins:    Leg swelling:         Pulmonary    Oxygen at home:    Productive cough:     Wheezing:         Neurologic    Sudden weakness in arms or legs:     Sudden numbness in arms or legs:      Sudden onset of difficulty speaking or slurred speech:    Temporary loss of vision in one eye:     Problems with dizziness:         Gastrointestinal    Blood in stool:      Vomited blood:         Genitourinary    Burning when urinating:     Blood in urine:        Psychiatric    Major depression:         Hematologic    Bleeding problems:    Problems with blood clotting too easily:        Skin    Rashes or ulcers:        Constitutional    Fever or chills:     PHYSICAL EXAM:   Vitals:   05/30/22 1926 05/30/22 2030 05/31/22 0517 05/31/22 0723  BP:   (!) 154/91 117/70  Pulse:   65 64  Resp:   18 18  Temp:   98 F (36.7 C) (!) 97.3 F (36.3 C)  TempSrc:    Oral  SpO2: 100% 100% 95% 100%  Weight:      Height:        GENERAL: The patient is a well-nourished male, in no acute distress. The vital signs are documented above. CARDIAC: There is a regular rate and rhythm.  PULMONARY: Nonlabored respirations ABDOMEN: Soft and non-tender  MUSCULOSKELETAL: There are no major deformities or cyanosis. NEUROLOGIC: No focal weakness or paresthesias are detected. SKIN: There are no ulcers or rashes noted. PSYCHIATRIC: The patient has a normal affect.   ASSESSMENT and PLAN   End-stage renal disease: I discussed with the patient that we will plan on placing a tunneled dialysis catheter so that he can initiate dialysis.  We will consider alternative access at a later date.  He will need vein mapping.   Charlena Cross, MD, FACS [facility] [phone] Pager [phone]

## 2022-05-31 NOTE — Progress Notes (Addendum)
Received patient in bed to unit.  Alert and oriented.  Informed consent signed and in chart.   TX duration:36minutes  Patient not tolerate hemodialysis treatment Transported back to the room  Alert, without acute distress.  Hand-off given to patient's nurse.   Access used: Rtght IJ permcath Access issues: none  Due to elevated heart rate and Pt getting agitated  Total UF removed: Medication(s) given: none Post HD VS: 146/83 heart rate 138. Dr. Danne Harbor notified and gave the order to discontinue the treatment. Pt stable post treatment heart rate 128. Time 2240hrs. Post HD weight: 8   [facility]

## 2022-05-31 NOTE — Anesthesia Procedure Notes (Signed)
Procedure Name: Intubation Date/Time: 05/31/2022 11:54 AM  Performed by: Gus Puma, CRNAPre-anesthesia Checklist: Patient identified, Emergency Drugs available, Suction available and Patient being monitored Patient Re-evaluated:Patient Re-evaluated prior to induction Oxygen Delivery Method: Circle System Utilized Preoxygenation: Pre-oxygenation with 100% oxygen Induction Type: IV induction Ventilation: Mask ventilation without difficulty Laryngoscope Size: Mac and 4 Grade View: Grade I Tube type: Oral Tube size: 7.5 mm Number of attempts: 1 Airway Equipment and Method: Stylet Placement Confirmation: ETT inserted through vocal cords under direct vision, positive ETCO2 and breath sounds checked- equal and bilateral Secured at: 23 cm Tube secured with: Tape Dental Injury: Teeth and Oropharynx as per pre-operative assessment

## 2022-05-31 NOTE — Op Note (Signed)
    NAME: Glenn Olson    MRN: [id number] DOB: 1940/02/18    DATE OF OPERATION: 05/31/2022  PREOP DIAGNOSIS:    End stage renal disease needing dialysis  POSTOP DIAGNOSIS:    Same  PROCEDURE:    Right internal jugular tunneled dialysis catheter placement 19cm  SURGEON: Victorino Sparrow  ASSIST: None  ANESTHESIA: General   EBL: 10ml  INDICATIONS:    Glenn Olson is a 82 y.o. male with end stage renal disease in need of HD access. BUN 249.  FINDINGS:    Patent right internal jugular vein  TECHNIQUE:   Using ultrasound guidance the right internal jugular vein was accessed with micropuncture technique.  Through the micropuncture sheath a floppy J-wire was advanced into the superior vena cava.  A small incision was made around the skin access point.  A counterincision was made in the chest under the clavicle.  A 19 cm tunneled dialysis catheter was then tunneled under the skin, over the clavicle into the incision in the neck.  The access point was serially dilated under direct fluoroscopic guidance.  A peel-away sheath was introduced into the superior vena cava under fluoroscopic guidance.  The tunneling device was removed and the catheter fed through the peel-away sheath into the superior vena cava.  The peel-away sheath was removed and the catheter gently pulled back.  Adequate position was confirmed with x-ray.  The catheter was tested and found to flush and draw back well.  Catheter was heparin locked.  Caps were applied.  Catheter was sutured to the skin.  The neck incision was closed with 4-0 Monocryl.   Ladonna Snide, MD [facility] DATE OF DICTATION:   05/31/2022

## 2022-05-31 NOTE — Progress Notes (Signed)
Patient returns from OR at this time 

## 2022-05-31 NOTE — Consult Note (Addendum)
Glenn Olson Admit Date: 05/30/2022 05/31/2022 Glenn Olson Requesting Physician:  Dr. Meridee Olson  Reason for Consult: ESRD pt needing to resume HD HPI:  Glenn Olson is an 82 year old male who presented with symptoms of fatigue, headache, chest pain. decreased appetite which has been worsening over the past 3 weeks.  His son, Glenn Olson, Glenn Olson. is at bedside and providing history as patient does not want to talk.  Pt used to get HD but quit about 14 months ago because he did not like going.  His son also states patient quit taking all of his medications about a year ago.  Per son, patient had been doing well, able to complete ADLs but over the last few weeks he has been declining, not wanting to walk around, not wanting to talk, has had decreased appetite but son states has continued drinking plenty of milk and water.  Son does not think he seems confused and states answers to questions are appropriate.  Son has not noticed any sick symptoms including fever, cough, or SOB.  Patient does admit to occasional vomiting but sounds like this is a chronic issue and occurs only on occasion.  He denies any diarrhea.  Patient is able to tell me that he is not currently in any pain, denies any shortness of breath, chest pain, lightheadedness at this time.  He does admit to generally not feeling well and would like to start HD again if it would help him feel better.   In the ED, patient was found to be uremic with BUN 241, bicarb < 7, creatinine 13.8, calcium 6, GFR 3.  He was given IV fluids and bicarb and fentanyl and morphine for pain. Head CT with mild cerebral atrophy, no acute pathology.  [facility] has been consulted for dialysis catheter placement.   PMH Incudes: ESRD, chronic indwelling suprapubic catheter   Creatinine, Ser (mg/dL)  Date Value  62/13/0865 13.75 (H)  05/30/2022 13.80 (H)  03/17/2021 4.50 (H)  02/06/2021 7.50 (H)  12/02/2020 4.20 (H)  12/01/2020 6.37 (H)  11/29/2020 5.77 (H)   09/27/2020 5.90 (H)  08/20/2008 2.8 (H)  ] I/Os:  ROS NSAIDS: No IV Contrast No TMP/SMX No Hypotension No Balance of 12 systems is negative w/ exceptions as above  PMH  Past Medical History:  Diagnosis Date   Anemia    Chronic kidney disease    Dysrhythmia    GERD (gastroesophageal reflux disease)    Neurogenic bladder    PSH  Past Surgical History:  Procedure Laterality Date   AV FISTULA PLACEMENT Left 09/27/2020   Procedure: LEFT ARM ARTERIOVENOUS GRAFT PLACEMENT USING GORE-TEX 4-7MM X 45CM  VASCULAR GRAFT;  Surgeon: Glenn Earthly, MD;  Location: [facility];  Service: Vascular;  Laterality: Left;   CAPD INSERTION N/A 03/17/2021   Procedure: ATTEMPTED INSERTION OF LAPAROSCOPIC CONTINUOUS AMBULATORY PERITONEAL DIALYSIS (CAPD) CATHETER;  Surgeon: Glenn Harman, MD;  Location: [facility] OR;  Service: Vascular;  Laterality: N/A;   CYSTOURETHROSCOPY     INSERTION OF DIALYSIS CATHETER Right 09/27/2020   Procedure: INSERTION OF PALINDROME TUNNELED DIALYSIS CATHETER 14.72f X 23CM;  Surgeon: Glenn Earthly, MD;  Location: [facility];  Service: Vascular;  Laterality: Right;   REMOVAL OF A DIALYSIS CATHETER N/A 12/06/2020   Procedure: MINOR REMOVAL OF A DIALYSIS CATHETER;  Surgeon: Glenn Earthly, MD;  Location: [facility];  Service: Vascular;  Laterality: N/A;   SUPRAPUBIC CATHETER PLACEMENT     FH History reviewed. No pertinent family history. SH  reports that he has never smoked. He has never used smokeless tobacco. He reports current alcohol use. He reports that he does not use drugs. Allergies No Known Allergies Home medications Prior to Admission medications   Medication Sig Start Date End Date Taking? Authorizing Provider  colchicine 0.6 MG tablet Take 0.6 mg by mouth daily as needed (gout/knee flare).   Yes [provider]    Current Medications Scheduled Meds: Continuous Infusions:  calcium gluconate 1,000 mg (05/31/22 1021)   sodium bicarbonate 150 mEq in sterile water  1,150 mL infusion 100 mL/hr at 05/31/22 0535   PRN Meds:.acetaminophen **OR** acetaminophen, HYDROmorphone (DILAUDID) injection, labetalol, polyethylene glycol, promethazine  CBC Recent Labs  Lab 05/30/22 1445 05/31/22 0723  WBC 9.3 6.6  NEUTROABS 8.3*  --   HGB 8.6* 7.3*  HCT 25.5* 21.7*  MCV 89.5 87.1  PLT 226 167   Basic Metabolic Panel Recent Labs  Lab 05/30/22 1445 05/31/22 0723  NA 134* 137  K 4.5 4.2  CL 104 104  CO2 <7* 8*  GLUCOSE 137* 83  BUN 241* 248*  CREATININE 13.80* 13.75*  CALCIUM 6.0* 5.6*    Physical Exam  Blood pressure 117/70, pulse 64, temperature (!) 97.3 F (36.3 C), temperature source Oral, resp. rate 18, height 5\' 9"  (1.753 m), weight 80.3 kg, SpO2 100 %. GEN: Frail elderly male, NAD ENT: MMM EYES: White sclera, clear conjunctiva CV: Tachycardic, regular rhythm, no murmur PULM: CTAB, normal effort ABD: Bowel sounds present, soft, nontender palpation, nondistended SKIN: Warm and very dry appearing EXT: No edema of BLEs Neuro: Alert and answering questions appropriately with short answers   Assessment  Uremia in setting of ESRD off of HD for over a year. Pt is fatigued and not wanting to speak but does not seem confused. Is alert and answering questions appropriately. He does desire to resume HD. Neurogenic bladder Anemia of CKD Stage V CKD-BMD  Plan VSS to place dialysis catheter today so HD can be resumed with plan for short HD sessions today through Saturday followed by full session on Monday. Continue sodium bicarbonate infusion.  Continue daily labs.  Nephrology will follow. Supra pubic catheter in place.  Monitor I/Os Will check iron stores and start ESA and follow Will check phos levels and iPTH and start binders as needed   Glenn Olson  05/31/2022, 10:40 AM    I have seen and examined this patient and agree with plan and assessment in the above note with renal recommendations/intervention highlighted.  Mr. Glenn Olson has been  without HD for the last year and is profoundly uremic.  Will need to be careful with HD as he is at high risk for dialysis dysequilibrium syndrome.  Will plan on daily short dialysis and follow his response.  He appears dry on exam so will keep even with HD. Jomarie Longs A Naia Ruff,MD 05/31/2022 1:25 PM

## 2022-05-31 NOTE — Anesthesia Postprocedure Evaluation (Signed)
Anesthesia Post Note  Patient: Glenn Olson  Procedure(s) Performed: INSERTION OF DIALYSIS CATHETER (Right: Neck)     Patient location during evaluation: PACU Anesthesia Type: General Level of consciousness: awake and alert Pain management: pain level controlled Vital Signs Assessment: post-procedure vital signs reviewed and stable Respiratory status: spontaneous breathing, nonlabored ventilation, respiratory function stable and patient connected to nasal cannula oxygen Cardiovascular status: blood pressure returned to baseline and stable Postop Assessment: no apparent nausea or vomiting Anesthetic complications: no   No notable events documented.  Last Vitals:  Vitals:   05/31/22 1330 05/31/22 1346  BP: (!) 149/96 123/85  Pulse: (!) 115 61  Resp: 19 15  Temp: 36.7 C 36.4 C  SpO2: 98% 100%    Last Pain:  Vitals:   05/31/22 1346  TempSrc: Oral  PainSc:                  Glenn Olson

## 2022-05-31 NOTE — Progress Notes (Signed)
  Daily Progress Note  Patient seen and examined prior to [facility] placement. Cachectic, failure to thrive Stated he would like to be DNR, would not want to be revived should his heart stop.  Understands that he will need to be intubated for the procedure. I also gave him the option of not pursuing dialysis, and moving to a palliative versus comfort care route.  He is not interested, and asked for [facility].  After discussing the risk and benefits of tunneled dialysis catheter placement, namely death, and his poor clinical condition, Glenn Olson elected to proceed.  Son was at bedside and agreed with the above.  Fara Olden MD MS [facility] [phone] 05/31/2022  10:59 AM

## 2022-05-31 NOTE — Progress Notes (Signed)
PROGRESS NOTE    Glenn Olson  XBJ:[id number] DOB: Jun 19, 1940 DOA: 05/30/2022 PCP: Benita Stabile, MD    Brief Narrative:  Glenn Olson is a 82 y.o. male with medical history significant for ESRD, neurogenic atonic bladder presented to hospital with headache and chest pain for 1 week.  Patient was on hemodialysis but had stopped going for hemodialysis for 1 year now.  Has been having poor oral intake with nausea and vomiting.  In the ED, patient was mildly tachypneic and tachycardic with elevated blood pressure.  Chest x-ray with increased left lower lower lobe markings. CO2 less than 7.  BUN of 241.  Potassium 4.5.  Creatinine 13.8.Marland KitchenTroponin 48 > 52.  Nephrology was consulted and decision was to proceed with hemodialysis.  Patient was then transferred to [facility].   Assessment and Plan:  * Uremia, history of end-stage renal disease Has not been on dialysis for 1 year.  Plan is to proceed with hemodialysis at this time.  Vascular surgery has been consulted for tunneled hemodialysis catheter placement.     Chest pain   Nonspecific and generalized chest pain.  Improved with 2 mg morphine given in ED.  Troponin 48 > 52.  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  With sinus tachycardia, no ST or T wave changes.  Atonic neurogenic bladder Continue suprapubic catheter  Anemia of chronic kidney disease.  Hemoglobin of 8.6.  Will continue to monitor.   Hypocalcemia.  Will give 1 ampoule of calcium gluconate 1 g.  Mild crampy feeling in the legs.  Poor historian.   DVT prophylaxis: SCDs Start: 05/30/22 1926   Code Status:     Code Status: DNR  Disposition: Uncertain at this time.  Status is: Inpatient Remains inpatient appropriate because: Uremia need for hemodialysis,   Family Communication: No one at bedside.  Consultants:  Nephrology Vascular surgery  Procedures:  None yet  Antimicrobials:  None  Anti-infectives (From admission, onward)    None      Subjective: Today,  patient was seen and examined at bedside.  Patient appears to be confused and disoriented restless in the bed.  Complains of discomfort in the legs.  Objective: Vitals:   05/30/22 1926 05/30/22 2030 05/31/22 0517 05/31/22 0723  BP:   (!) 154/91 117/70  Pulse:   65 64  Resp:   18 18  Temp:   98 F (36.7 C) (!) 97.3 F (36.3 C)  TempSrc:    Oral  SpO2: 100% 100% 95% 100%  Weight:      Height:        Intake/Output Summary (Last 24 hours) at 05/31/2022 0904 Last data filed at 05/31/2022 0300 Gross per 24 hour  Intake 945.07 ml  Output 150 ml  Net 795.07 ml   Filed Weights   05/30/22 1436  Weight: 80.3 kg    Physical Examination: Body mass index is 26.14 kg/m.  General:  Average built, not in obvious distress, elderly male, mildly confused disoriented and mildly agitated, HENT:   No scleral pallor or icterus noted. Oral mucosa is moist.  Chest:   Diminished breath sounds bilaterally.  CVS: S1 &S2 heard. No murmur.  Regular rate and rhythm. Abdomen: Soft, nontender, nondistended.  Bowel sounds are heard.   Extremities: No cyanosis, clubbing or edema.  Peripheral pulses are palpable. Psych: Alert, awake and Communicative, restless, confused, disoriented CNS:  No cranial nerve deficits.  Moving extremities. Skin: Warm and dry.  No rashes noted.  Dry skin  Data Reviewed:  CBC: Recent Labs  Lab 05/30/22 1445 05/31/22 0723  WBC 9.3 6.6  NEUTROABS 8.3*  --   HGB 8.6* 7.3*  HCT 25.5* 21.7*  MCV 89.5 87.1  PLT 226 167    Basic Metabolic Panel: Recent Labs  Lab 05/30/22 1445 05/31/22 0723  NA 134* 137  K 4.5 4.2  CL 104 104  CO2 <7* 8*  GLUCOSE 137* 83  BUN 241* 248*  CREATININE 13.80* 13.75*  CALCIUM 6.0* 5.6*    Liver Function Tests: Recent Labs  Lab 05/30/22 1445  AST 12*  ALT 16  ALKPHOS 103  BILITOT 0.7  PROT 7.9  ALBUMIN 4.1     Radiology Studies: CT Head Wo Contrast  Result Date: 05/30/2022 CLINICAL DATA:  Acute onset headache. EXAM: CT HEAD  WITHOUT CONTRAST TECHNIQUE: Contiguous axial images were obtained from the base of the skull through the vertex without intravenous contrast. RADIATION DOSE REDUCTION: This exam was performed according to the departmental dose-optimization program which includes automated exposure control, adjustment of the mA and/or kV according to patient size and/or use of iterative reconstruction technique. COMPARISON:  None Available. FINDINGS: Brain: No evidence of intracranial hemorrhage, acute infarction, hydrocephalus, extra-axial collection, or mass lesion/mass effect. Mild diffuse cerebral atrophy noted. Vascular:  No hyperdense vessel or other acute findings. Skull: No evidence of fracture or other significant bone abnormality. Sinuses/Orbits: Near-complete opacification of left maxillary sinus. Other: None. IMPRESSION: No acute intracranial abnormality. Mild cerebral atrophy. Left maxillary sinus disease. Electronically Signed   By: Danae Orleans M.D.   On: 05/30/2022 16:07   DG Chest Port 1 View  Result Date: 05/30/2022 CLINICAL DATA:  Chest pain, headaches EXAM: PORTABLE CHEST 1 VIEW COMPARISON:  02/06/2021 FINDINGS: Transverse diameter of heart is increased. Central pulmonary vessels are prominent. There are no signs of alveolar pulmonary edema. Increased markings are seen in left lower lung field. Possible calcified granuloma is seen in right lower lung field. Patient's chin is partially obscuring the left apex. There is no pleural effusion or pneumothorax. IMPRESSION: Central pulmonary vessels are prominent without signs of alveolar pulmonary edema. Increased markings are seen in left lower lung field which may suggest crowding of normal bronchovascular structures or atelectasis/pneumonia. Electronically Signed   By: Ernie Avena M.D.   On: 05/30/2022 14:57      LOS: 1 day    Joycelyn Das, MD [facility] Available via Epic secure chat 7am-7pm After these hours, please refer to coverage  provider listed on [email or web address] 05/31/2022, 9:04 AM

## 2022-05-31 NOTE — Hospital Course (Addendum)
82 y.o. male with medical history significant for ESRD, neurogenic atonic bladder presented to [facility] with headache and chest pain for 1 week.  Patient was on hemodialysis but had stopped going for hemodialysis for 1 year now.  Has been having poor oral intake with nausea and vomiting.  In the ED, patient was mildly tachypneic and tachycardic with elevated blood pressure.  Chest x-ray with increased left lower lower lobe markings. CO2 less than 7.  BUN of 241.  Potassium 4.5.  Cr 13.8.Troponin 48 > 52.  Nephrology was consulted and decision was to proceed with hemodialysis.  Patient was then transferred to [facility].   Patient has had HD cath placed and started on hemodialysis.  Encephalopathy improving. Hospital course complicated by A-fib with RVR.  He was started on p.o. Cardizem and IV heparin.  TTE without significant finding.  RVR improved. Transitioned to p.o. Cardizem CD, Eliquis and signed off by cardiology. Patient was treated for anemia of CKD, hypertension, secondary hyperparathyroidism with hypocalcemia and hyperphosphatemia. Remains weak and deconditioned with debility PT OT recommending CIR and awaiting for placement

## 2022-05-31 NOTE — Progress Notes (Signed)
Patient extremely confused throughout the night. Attempting to get up and void, not understanding that his suprapubic catheter is hooked up to a drainage bag. Patient repeatedly taking off telemetry monitor. Telemetry placed on standby.

## 2022-05-31 NOTE — Anesthesia Preprocedure Evaluation (Addendum)
Anesthesia Evaluation  Patient identified by MRN, date of birth, ID band Patient awake    Reviewed: Allergy & Precautions, NPO status , Patient's Chart, lab work & pertinent test results  Airway Mallampati: II  TM Distance: >3 FB Neck ROM: Full    Dental  (+) Edentulous Upper, Edentulous Lower   Pulmonary neg pulmonary ROS   Pulmonary exam normal        Cardiovascular negative cardio ROS + dysrhythmias Atrial Fibrillation  Rhythm:Regular Rate:Normal     Neuro/Psych negative neurological ROS  negative psych ROS   GI/Hepatic Neg liver ROS,GERD  ,,  Endo/Other  negative endocrine ROS    Renal/GU CRFRenal disease  negative genitourinary   Musculoskeletal   Abdominal Normal abdominal exam  (+)   Peds  Hematology  (+) Blood dyscrasia, anemia   Anesthesia Other Findings   Reproductive/Obstetrics                             Anesthesia Physical Anesthesia Plan  ASA: 3  Anesthesia Plan: General   Post-op Pain Management:    Induction: Intravenous  PONV Risk Score and Plan: 2 and Ondansetron, Dexamethasone and Treatment may vary due to age or medical condition  Airway Management Planned: Mask and Oral ETT  Additional Equipment: None  Intra-op Plan:   Post-operative Plan: Extubation in OR  Informed Consent: I have reviewed the patients History and Physical, chart, labs and discussed the procedure including the risks, benefits and alternatives for the proposed anesthesia with the patient or authorized representative who has indicated his/her understanding and acceptance.   Patient has DNR.  Discussed DNR with power of attorney and Continue DNR.   Dental advisory given and Consent reviewed with POA  Plan Discussed with:   Anesthesia Plan Comments: (Lab Results      Component                Value               Date                      WBC                      6.6                  05/31/2022                HGB                      7.5 (L)             05/31/2022                HCT                      22.0 (L)            05/31/2022                MCV                      87.1                05/31/2022                PLT  167                 05/31/2022             Lab Results      Component                Value               Date                      NA                       137                 05/31/2022                K                        4.1                 05/31/2022                CO2                      8 (L)               05/31/2022                GLUCOSE                  74                  05/31/2022                BUN                      >130 (H)            05/31/2022                CREATININE               15.50 (H)           05/31/2022                CALCIUM                  5.6 (LL)            05/31/2022                GFRNONAA                 3 (L)               05/31/2022           )       Anesthesia Quick Evaluation

## 2022-06-01 ENCOUNTER — Encounter (HOSPITAL_COMMUNITY): Payer: Self-pay | Admitting: Vascular Surgery

## 2022-06-01 DIAGNOSIS — E872 Acidosis, unspecified: Secondary | ICD-10-CM | POA: Diagnosis not present

## 2022-06-01 DIAGNOSIS — R079 Chest pain, unspecified: Secondary | ICD-10-CM | POA: Diagnosis not present

## 2022-06-01 DIAGNOSIS — N19 Unspecified kidney failure: Secondary | ICD-10-CM | POA: Diagnosis not present

## 2022-06-01 DIAGNOSIS — N312 Flaccid neuropathic bladder, not elsewhere classified: Secondary | ICD-10-CM | POA: Diagnosis not present

## 2022-06-01 LAB — RENAL FUNCTION PANEL
Albumin: 3.4 g/dL — ABNORMAL LOW (ref 3.5–5.0)
Anion gap: 28 — ABNORMAL HIGH (ref 5–15)
BUN: 206 mg/dL — ABNORMAL HIGH (ref 8–23)
CO2: 12 mmol/L — ABNORMAL LOW (ref 22–32)
Calcium: 5.5 mg/dL — CL (ref 8.9–10.3)
Chloride: 100 mmol/L (ref 98–111)
Creatinine, Ser: 12.4 mg/dL — ABNORMAL HIGH (ref 0.61–1.24)
GFR, Estimated: 4 mL/min — ABNORMAL LOW (ref >60)
Glucose, Bld: 77 mg/dL (ref 70–99)
Phosphorus: 9.3 mg/dL — ABNORMAL HIGH (ref 2.5–4.6)
Potassium: 3.3 mmol/L — ABNORMAL LOW (ref 3.5–5.1)
Sodium: 140 mmol/L (ref 135–145)

## 2022-06-01 LAB — IRON AND TIBC
Iron: 31 ug/dL — ABNORMAL LOW (ref 45–182)
Saturation Ratios: 16 % — ABNORMAL LOW (ref 17.9–39.5)
TIBC: 197 ug/dL — ABNORMAL LOW (ref 250–450)
UIBC: 166 ug/dL

## 2022-06-01 LAB — ABO/RH: ABO/RH(D): A POS

## 2022-06-01 LAB — CBC
HCT: 20 % — ABNORMAL LOW (ref 39.0–52.0)
Hemoglobin: 6.7 g/dL — CL (ref 13.0–17.0)
MCH: 29.3 pg (ref 26.0–34.0)
MCHC: 33.5 g/dL (ref 30.0–36.0)
MCV: 87.3 fL (ref 80.0–100.0)
Platelets: 149 10*3/uL — ABNORMAL LOW (ref 150–400)
RBC: 2.29 MIL/uL — ABNORMAL LOW (ref 4.22–5.81)
RDW: 15.9 % — ABNORMAL HIGH (ref 11.5–15.5)
WBC: 6.5 10*3/uL (ref 4.0–10.5)
nRBC: 0 % (ref 0.0–0.2)

## 2022-06-01 LAB — HEPATITIS B SURFACE ANTIBODY, QUANTITATIVE: Hep B S AB Quant (Post): 15.3 m[IU]/mL (ref >9.9)

## 2022-06-01 LAB — GLUCOSE, CAPILLARY
Glucose-Capillary: 71 mg/dL (ref 70–99)
Glucose-Capillary: 76 mg/dL (ref 70–99)

## 2022-06-01 LAB — TYPE AND SCREEN

## 2022-06-01 LAB — BPAM RBC
Blood Product Expiration Date: 202405252359
Unit Type and Rh: 6200

## 2022-06-01 LAB — PREPARE RBC (CROSSMATCH)

## 2022-06-01 MED ORDER — DARBEPOETIN ALFA 40 MCG/0.4ML IJ SOSY
40.0000 ug | PREFILLED_SYRINGE | INTRAMUSCULAR | Status: DC
  Administered 2022-06-01 – 2022-06-08 (×2): 40 ug via SUBCUTANEOUS
  Filled 2022-06-01 (×2): qty 0.4

## 2022-06-01 MED ORDER — ANTICOAGULANT SODIUM CITRATE 4% (200MG/5ML) IV SOLN: 5.0000 mL | Status: DC | PRN

## 2022-06-01 MED ORDER — LIDOCAINE HCL (PF) 1 % IJ SOLN: 5.0000 mL | INTRAMUSCULAR | Status: DC | PRN

## 2022-06-01 MED ORDER — SODIUM CHLORIDE 0.9% IV SOLUTION: Freq: Once | INTRAVENOUS | Status: DC

## 2022-06-01 MED ORDER — LIDOCAINE-PRILOCAINE 2.5-2.5 % EX CREA: 1.0000 | TOPICAL_CREAM | CUTANEOUS | Status: DC | PRN

## 2022-06-01 MED ORDER — PENTAFLUOROPROP-TETRAFLUOROETH EX AERO: 1.0000 | INHALATION_SPRAY | CUTANEOUS | Status: DC | PRN

## 2022-06-01 MED ORDER — ALTEPLASE 2 MG IJ SOLR: 2.0000 mg | Freq: Once | INTRAMUSCULAR | Status: DC | PRN

## 2022-06-01 MED ORDER — CALCIUM ACETATE (PHOS BINDER) 667 MG PO CAPS
1334.0000 mg | ORAL_CAPSULE | Freq: Three times a day (TID) | ORAL | Status: DC
  Administered 2022-06-03 – 2022-06-09 (×18): 1334 mg via ORAL
  Filled 2022-06-01 (×20): qty 2

## 2022-06-01 MED ORDER — HEPARIN SODIUM (PORCINE) 1000 UNIT/ML DIALYSIS
1000.0000 [IU] | INTRAMUSCULAR | Status: DC | PRN
  Administered 2022-06-01: 3200 [IU]
  Filled 2022-06-01: qty 1

## 2022-06-01 NOTE — Progress Notes (Signed)
PT Cancellation Note  Patient Details Name: Treighton Mezey MRN: [id number] DOB: Mar 23, 1940   Cancelled Treatment:    Reason Eval/Treat Not Completed: Medical issues which prohibited therapy (hemoglobin 6.7; awaiting blood transfusion).  Lillia Pauls, PT, DPT Acute Rehabilitation Services Office [phone]    Norval Morton 06/01/2022, 11:44 AM

## 2022-06-01 NOTE — Care Management Important Message (Signed)
Important Message  Patient Details  Name: Glenn Olson MRN: [id number] Date of Birth: 1940/12/31   Medicare Important Message Given:  Yes     Petrea Fredenburg Stefan Church 06/01/2022, 2:51 PM

## 2022-06-01 NOTE — Progress Notes (Addendum)
Pt is not a candidate for AVF creation at this time due to his poor clinical status. High probability of lifelong catheter dependence.  Please call prior to discharge for reassessment should his overall clinical status change.  Recommend palliative care involvement.    Victorino Sparrow MD

## 2022-06-01 NOTE — Progress Notes (Signed)
Patient to dialysis at this time.

## 2022-06-01 NOTE — Progress Notes (Signed)
Received patient in bed to unit.  Alert and oriented.  Informed consent signed and in chart.   TX duration:1:33  Patient tolerated well.  Transported back to the room  Alert, without acute distress.  Hand-off given to patient's nurse.   Access used: right [facility] Access issues: none  Total UF removed: +200 Medication(s) given: diludid    06/01/22 1630  Vitals  BP (!) 147/94  MAP (mmHg) 108  BP Location Right Arm  BP Method Automatic  Patient Position (if appropriate) Lying  Pulse Rate (!) 113  Pulse Rate Source Monitor  Resp 16  Oxygen Therapy  SpO2 98 %  O2 Device Nasal Cannula  O2 Flow Rate (L/min) 2 L/min  During Treatment Monitoring  HD Safety Checks Performed Yes  Intra-Hemodialysis Comments  (tx terminated with 277 minutes left due to machine alert to end tx. RN Victorino Dike notified)  Dialysis Fluid Bolus Normal Saline  Bolus Amount (mL) 300 mL      Glenn Olson S [facility]

## 2022-06-01 NOTE — Progress Notes (Addendum)
PROGRESS NOTE    Glenn Olson  GMW:[id number] DOB: 1941-01-22 DOA: 05/30/2022 PCP: Benita Stabile, MD    Brief Narrative:  Glenn Olson is a 82 y.o. male with medical history significant for ESRD, neurogenic atonic bladder presented to hospital with headache and chest pain for 1 week.  Patient was on hemodialysis but had stopped going for hemodialysis for 1 year now.  Has been having poor oral intake with nausea and vomiting.  In the ED, patient was mildly tachypneic and tachycardic with elevated blood pressure.  Chest x-ray with increased left lower lower lobe markings. CO2 less than 7.  BUN of 241.  Potassium 4.5.  Creatinine 13.8.Marland KitchenTroponin 48 > 52.  Nephrology was consulted and decision was to proceed with hemodialysis.  Patient was then transferred to [facility].  At this time patient does have vascular catheter placed in by vascular surgery.   Assessment and Plan:  * Uremia, history of end-stage renal disease Has not been on dialysis for 1 year.  Plan is to proceed with hemodialysis at this time.  Vascular surgery has placed in tunneled hemodialysis catheter placement.  Nephrology on board for hemodialysis.   Chest pain   Nonspecific and generalized chest pain.   Troponin 48 > 52.  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  With sinus tachycardia, no ST or T wave changes.  Denies chest pain today.  Atonic neurogenic bladder Continue suprapubic catheter  Anemia of chronic kidney disease.  Hemoglobin of 6.7 on repeat today.  Will continue to monitor.  Transfuse 1 unit of packed RBC during hemodialysis.  Hypocalcemia.  Asymptomatic.  Received 1 amp of calcium gluconate yesterday.  Labs pending for today.  Borderline hypokalemia.  Could be adjusted with hemodialysis.  Deconditioning, debility.  Will get PT OT evaluation..  Palliative care for goals of care discussion.   DVT prophylaxis: SCDs Start: 05/30/22 1926   Code Status:     Code Status: DNR  Disposition: Uncertain at this time.   Continue to screen nursing facility.  Will get PT OT evaluation.  Status is: Inpatient Remains inpatient appropriate because: Uremia need for hemodialysis, goals of care discussion.   Family Communication:  Spoke with the patient's son and daughter at bedside.  Consultants:  Nephrology Vascular surgery Palliative care  Procedures:  Right internal jugular tunneled hemodialysis catheter placement  Antimicrobials:  None  Anti-infectives (From admission, onward)    Start     Dose/Rate Route Frequency Ordered Stop   05/31/22 1115  ceFAZolin (ANCEF) IVPB 2g/100 mL premix        2 g 200 mL/hr over 30 Minutes Intravenous  Once 05/31/22 1108 05/31/22 1200   05/31/22 1112  ceFAZolin (ANCEF) 2-4 GM/100ML-% IVPB       Note to Pharmacy: Shanda Bumps M: cabinet override      05/31/22 1112 05/31/22 1206      Subjective: Today, patient was seen and examined at bedside.  Patient denies any nausea vomiting fever chills or rigor.  Patient's family at bedside.  Appears to be more calm and comfortable.  Objective: Vitals:   05/31/22 2030 05/31/22 2050 06/01/22 0005 06/01/22 0450  BP: 139/80 137/84 (!) 148/85 (!) 151/93  Pulse: (!) 126 (!) 130 (!) 115 64  Resp: 18 18 20 18   Temp:   (!) 97.5 F (36.4 C) 97.7 F (36.5 C)  TempSrc:   Oral Oral  SpO2:   91% 100%  Weight:      Height:  Intake/Output Summary (Last 24 hours) at 06/01/2022 0950 Last data filed at 06/01/2022 7846 Gross per 24 hour  Intake 2938.5 ml  Output 1360 ml  Net 1578.5 ml    Filed Weights   05/30/22 1436  Weight: 80.3 kg    Physical Examination: Body mass index is 26.14 kg/m.  General:  Average built, not in obvious distress, elderly male more alert awake and Communicative.  Elderly male, deconditioned. HENT:   No scleral pallor or icterus noted. Oral mucosa is moist.  Chest:   Diminished breath sounds bilaterally.  CVS: S1 &S2 heard. No murmur.  Regular rate and rhythm. Abdomen: Soft, nontender,  nondistended.  Bowel sounds are heard.   Extremities: No cyanosis, clubbing or edema.   Psych: Alert, awake and Communicative, calm. CNS:  No cranial nerve deficits.  Moving extremities. Skin: Warm and dry.  No rashes noted.  Dry skin  Data Reviewed:   CBC: Recent Labs  Lab 05/30/22 1445 05/31/22 0723 05/31/22 1117  WBC 9.3 6.6  --   NEUTROABS 8.3*  --   --   HGB 8.6* 7.3* 7.5*  HCT 25.5* 21.7* 22.0*  MCV 89.5 87.1  --   PLT 226 167  --      Basic Metabolic Panel: Recent Labs  Lab 05/30/22 1445 05/31/22 0723 05/31/22 1117 05/31/22 1138  NA 134* 137 137 139  K 4.5 4.2 4.1 4.0  CL 104 104 106 102  CO2 <7* 8*  --  7*  GLUCOSE 137* 83 74 77  BUN 241* 248* >130* 257*  CREATININE 13.80* 13.75* 15.50* 13.97*  CALCIUM 6.0* 5.6*  --  5.9*     Liver Function Tests: Recent Labs  Lab 05/30/22 1445  AST 12*  ALT 16  ALKPHOS 103  BILITOT 0.7  PROT 7.9  ALBUMIN 4.1      Radiology Studies: DG CHEST PORT 1 VIEW  Result Date: 05/31/2022 CLINICAL DATA:  Insertion of tunneled venous catheter EXAM: PORTABLE CHEST 1 VIEW COMPARISON:  Portable exam 1455 hours compared to 05/30/2022 FINDINGS: RIGHT jugular line tip projects over RIGHT atrium. Enlargement of cardiac silhouette. Mediastinal contours and pulmonary vascularity normal. Lungs clear. No infiltrate, pleural effusion, or pneumothorax. Osseous structures unremarkable. IMPRESSION: No pneumothorax following RIGHT jugular line insertion. Electronically Signed   By: Ulyses Southward M.D.   On: 05/31/2022 15:09   HYBRID OR IMAGING ([facility] ONLY)  Result Date: 05/31/2022 There is no interpretation for this exam.  This order is for images obtained during a surgical procedure.  Please See "Surgeries" Tab for more information regarding the procedure.   CT Head Wo Contrast  Result Date: 05/30/2022 CLINICAL DATA:  Acute onset headache. EXAM: CT HEAD WITHOUT CONTRAST TECHNIQUE: Contiguous axial images were obtained from the base of the skull  through the vertex without intravenous contrast. RADIATION DOSE REDUCTION: This exam was performed according to the departmental dose-optimization program which includes automated exposure control, adjustment of the mA and/or kV according to patient size and/or use of iterative reconstruction technique. COMPARISON:  None Available. FINDINGS: Brain: No evidence of intracranial hemorrhage, acute infarction, hydrocephalus, extra-axial collection, or mass lesion/mass effect. Mild diffuse cerebral atrophy noted. Vascular:  No hyperdense vessel or other acute findings. Skull: No evidence of fracture or other significant bone abnormality. Sinuses/Orbits: Near-complete opacification of left maxillary sinus. Other: None. IMPRESSION: No acute intracranial abnormality. Mild cerebral atrophy. Left maxillary sinus disease. Electronically Signed   By: Danae Orleans M.D.   On: 05/30/2022 16:07   DG Chest Port 1  View  Result Date: 05/30/2022 CLINICAL DATA:  Chest pain, headaches EXAM: PORTABLE CHEST 1 VIEW COMPARISON:  02/06/2021 FINDINGS: Transverse diameter of heart is increased. Central pulmonary vessels are prominent. There are no signs of alveolar pulmonary edema. Increased markings are seen in left lower lung field. Possible calcified granuloma is seen in right lower lung field. Patient's chin is partially obscuring the left apex. There is no pleural effusion or pneumothorax. IMPRESSION: Central pulmonary vessels are prominent without signs of alveolar pulmonary edema. Increased markings are seen in left lower lung field which may suggest crowding of normal bronchovascular structures or atelectasis/pneumonia. Electronically Signed   By: Ernie Avena M.D.   On: 05/30/2022 14:57      LOS: 2 days    Joycelyn Das, MD [facility] Available via Epic secure chat 7am-7pm After these hours, please refer to coverage provider listed on [email or web address] 06/01/2022, 9:50 AM

## 2022-06-01 NOTE — Progress Notes (Signed)
Case discussed with nephrologist. Navigator will f/u on Monday to see if pt able to tolerate HD and assist with out-pt HD arrangements if needed/appropriate.   Olivia Canter Renal Navigator [phone]

## 2022-06-01 NOTE — Progress Notes (Signed)
Yellow Mews  06/01/22 0005  Assess: MEWS Score  Temp (!) 97.5 F (36.4 C)  BP (!) 148/85  MAP (mmHg) 103  Pulse Rate (!) 115  Resp 20  Level of Consciousness Alert  SpO2 91 %  O2 Device Room Air  Patient Activity (if Appropriate) In bed  Assess: MEWS Score  MEWS Temp 0  MEWS Systolic 0  MEWS Pulse 2  MEWS RR 0  MEWS LOC 0  MEWS Score 2  MEWS Score Color Yellow  Assess: if the MEWS score is Yellow or Red  Were vital signs taken at a resting state? Yes  Focused Assessment No change from prior assessment  Does the patient meet 2 or more of the SIRS criteria? No  MEWS guidelines implemented  Yes, yellow  Treat  MEWS Interventions Considered administering scheduled or prn medications/treatments as ordered  Take Vital Signs  Increase Vital Sign Frequency  Yellow: Q2hr x1, continue Q4hrs until patient remains green for 12hrs  Escalate  MEWS: Escalate Yellow: Discuss with charge nurse and consider notifying provider and/or RRT  Notify: Charge Nurse/RN  Name of Charge Nurse/RN Notified Development worker, international aid CN  Provider Notification  Provider Name/Title Odie Sera MD.  Date Provider Notified 05/31/22  Time Provider Notified 2340  Method of Notification Page  Notification Reason New onset of dysrhythmia  Provider response Evaluate remotely  Date of Provider Response 05/31/22  Time of Provider Response 2345  Assess: SIRS CRITERIA  SIRS Temperature  0  SIRS Pulse 1  SIRS Respirations  0  SIRS WBC 0  SIRS Score Sum  1

## 2022-06-01 NOTE — Progress Notes (Addendum)
Admit: 05/30/2022 LOS: 2  Glenn Olson is an 82 year old male who presented with symptoms of fatigue, headache, chest pain in setting of quitting HD quit about 14 months ago because he did not like going and stopping all of his medications. HD catheter was placed by [facility] yesterday as pt requested to resume HD.   Unfortunately, patient did not tolerate HD well yesterday.  He became agitated and HD was stopped after about 30 minutes.  Subjective:  Patient's daughter and son are at bedside.  They both state patient would like to try HD again today which patient also confirms.  Patient is continuously scratching his left foot throughout exam.  Daughter asked if he can get something for his itchy foot.  05/02 0701 - 05/03 0700 In: 2938.5 [I.V.:2838.5; IV Piggyback:100] Out: 1360 [Urine:1150; Blood:10]  Filed Weights   05/30/22 1436  Weight: 80.3 kg    Scheduled Meds:  Chlorhexidine Gluconate Cloth  6 each Topical Q0600   Continuous Infusions:  anticoagulant sodium citrate     sodium bicarbonate 150 mEq in sterile water 1,150 mL infusion 100 mL/hr at 06/01/22 0305   PRN Meds:.acetaminophen **OR** acetaminophen, alteplase, anticoagulant sodium citrate, heparin, HYDROmorphone (DILAUDID) injection, labetalol, lidocaine (PF), lidocaine-prilocaine, pentafluoroprop-tetrafluoroeth, polyethylene glycol, promethazine  Current Labs: reviewed    Physical Exam:  Blood pressure (!) 151/93, pulse 64, temperature 97.7 F (36.5 C), temperature source Oral, resp. rate 18, height 5\' 9"  (1.753 m), weight 80.3 kg, SpO2 100 %. General: Frail 82 year old male, sitting up in bed scratching his foot Cardio: Tachycardic, regular rhythm, no murmurs Lungs: CTAB, normal effort Extremities: No edema BLEs , left forearm AVG +T/B Skin: Dry appearing, no rashes Neuro: Patient appears sluggish, is able to answer questions appropriately with short answers.  A ESRD and uremia: HD catheter placed 5/2, HD session on  5/2 cut short to 30 minutes due to patient agitation.  Anemia of CKD V: Hgb 7.5>6.7 today. Primary team has ordered I Unit RBCs w/ HD today. Iron 31, TIBC and saturation ratios low.  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  Neurogenic bladder Acute metabolic encephalopathy/uremia  P ESRD and uremia: Plan for trail of HD today with shortened session to avoid dialysis dysequilibrium syndrome.  Family members are aware that if patient cannot tolerate HD today, will need to consider palliative consult. Anemia of CKD V: 1 U RBCs w/ HD. CTM hgb. Will start Aranesp 40 mcg weekly.  Vascular access - he has a functioning AVG in his left forearm, as well as a RIJ TDC.  Given his confusion, would not be safe to use AVG for now.  Will address this in the future if he tolerates HD. If he becomes agitated again with HD, would then recommend transitioning to comfort care. 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 Morphine should not be used.  Baclofen should be avoided Avoid oral sodium phosphate and magnesium citrate based laxatives / bowel preps      Recent Labs  Lab 05/30/22 1445 05/31/22 0723 05/31/22 1117 05/31/22 1138  NA 134* 137 137 139  K 4.5 4.2 4.1 4.0  CL 104 104 106 102  CO2 <7* 8*  --  7*  GLUCOSE 137* 83 74 77  BUN 241* 248* >130* 257*  CREATININE 13.80* 13.75* 15.50* 13.97*  CALCIUM 6.0* 5.6*  --  5.9*   Recent Labs  Lab 05/30/22 1445 05/31/22 1610  05/31/22 1117  WBC 9.3 6.6  --   NEUTROABS 8.3*  --   --   HGB 8.6* 7.3* 7.5*  HCT 25.5* 21.7* 22.0*  MCV 89.5 87.1  --   PLT 226 167  --       Erick Alley, DO Family Medicine Resident, PGY-2   I have seen and examined this patient and agree with plan and assessment in the above note with renal recommendations/intervention highlighted.  I spoke with his son and daughter  regarding his intolerance to dialysis last night.  We will attempt another session of HD today and if he again becomes agitated on HD, we would recommend transitioning to comfort care.  He did not like dialysis before and I don't think it will be much more different this time, once his uremia is cleared.   Julien Nordmann Laurella Tull,MD 06/01/2022 12:54 PM

## 2022-06-01 NOTE — Progress Notes (Signed)
Patient returns from dialysis at this time 

## 2022-06-01 NOTE — Progress Notes (Signed)
Patient has been restless all night, attempting to get out bed and pulling IV tubing and Telemetry leads. Staff took turns to sit with the patient all night. Patient will need a safety sitter d/t high fall risk.

## 2022-06-02 DIAGNOSIS — E872 Acidosis, unspecified: Secondary | ICD-10-CM | POA: Diagnosis not present

## 2022-06-02 DIAGNOSIS — N19 Unspecified kidney failure: Secondary | ICD-10-CM | POA: Diagnosis not present

## 2022-06-02 DIAGNOSIS — R079 Chest pain, unspecified: Secondary | ICD-10-CM | POA: Diagnosis not present

## 2022-06-02 DIAGNOSIS — N312 Flaccid neuropathic bladder, not elsewhere classified: Secondary | ICD-10-CM | POA: Diagnosis not present

## 2022-06-02 LAB — CBC
HCT: 21.5 % — ABNORMAL LOW (ref 39.0–52.0)
Hemoglobin: 7.6 g/dL — ABNORMAL LOW (ref 13.0–17.0)
MCH: 29.8 pg (ref 26.0–34.0)
MCHC: 35.3 g/dL (ref 30.0–36.0)
MCV: 84.3 fL (ref 80.0–100.0)
Platelets: 136 10*3/uL — ABNORMAL LOW (ref 150–400)
RBC: 2.55 MIL/uL — ABNORMAL LOW (ref 4.22–5.81)
RDW: 15.6 % — ABNORMAL HIGH (ref 11.5–15.5)
WBC: 7.6 10*3/uL (ref 4.0–10.5)
nRBC: 0 % (ref 0.0–0.2)

## 2022-06-02 LAB — TYPE AND SCREEN
ABO/RH(D): A POS
Antibody Screen: NEGATIVE
Unit division: 0

## 2022-06-02 LAB — RENAL FUNCTION PANEL
Albumin: 3.2 g/dL — ABNORMAL LOW (ref 3.5–5.0)
Anion gap: 26 — ABNORMAL HIGH (ref 5–15)
BUN: 143 mg/dL — ABNORMAL HIGH (ref 8–23)
CO2: 17 mmol/L — ABNORMAL LOW (ref 22–32)
Calcium: 5.6 mg/dL — CL (ref 8.9–10.3)
Chloride: 94 mmol/L — ABNORMAL LOW (ref 98–111)
Creatinine, Ser: 9.38 mg/dL — ABNORMAL HIGH (ref 0.61–1.24)
GFR, Estimated: 5 mL/min — ABNORMAL LOW (ref >60)
Glucose, Bld: 77 mg/dL (ref 70–99)
Phosphorus: 7.4 mg/dL — ABNORMAL HIGH (ref 2.5–4.6)
Potassium: 3.1 mmol/L — ABNORMAL LOW (ref 3.5–5.1)
Sodium: 137 mmol/L (ref 135–145)

## 2022-06-02 LAB — BPAM RBC: ISSUE DATE / TIME: 202405031750

## 2022-06-02 LAB — MAGNESIUM: Magnesium: 1.3 mg/dL — ABNORMAL LOW (ref 1.7–2.4)

## 2022-06-02 MED ORDER — HEPARIN SODIUM (PORCINE) 1000 UNIT/ML IJ SOLN
INTRAMUSCULAR | Status: AC
  Administered 2022-06-02: 3200 [IU]
  Filled 2022-06-02: qty 4

## 2022-06-02 MED ORDER — MIDAZOLAM HCL 2 MG/2ML IJ SOLN
1.0000 mg | Freq: Once | INTRAMUSCULAR | Status: AC
  Administered 2022-06-02: 1 mg via INTRAVENOUS
  Filled 2022-06-02: qty 2

## 2022-06-02 MED ORDER — ONDANSETRON HCL 4 MG/2ML IJ SOLN
4.0000 mg | Freq: Four times a day (QID) | INTRAMUSCULAR | Status: DC | PRN
  Administered 2022-06-02 – 2022-06-08 (×3): 4 mg via INTRAVENOUS
  Filled 2022-06-02 (×3): qty 2

## 2022-06-02 MED ORDER — MAGNESIUM SULFATE 2 GM/50ML IV SOLN
2.0000 g | Freq: Once | INTRAVENOUS | Status: AC
  Administered 2022-06-02: 2 g via INTRAVENOUS
  Filled 2022-06-02: qty 50

## 2022-06-02 MED ORDER — CALCIUM GLUCONATE-NACL 1-0.675 GM/50ML-% IV SOLN
1.0000 g | Freq: Once | INTRAVENOUS | Status: AC
  Administered 2022-06-02: 1000 mg via INTRAVENOUS
  Filled 2022-06-02: qty 50

## 2022-06-02 NOTE — Progress Notes (Addendum)
   06/02/22 1057  Vitals  Temp 97.7 F (36.5 C)  Temp Source Oral  BP (!) 143/98  MAP (mmHg) 110  BP Location Right Arm  BP Method Automatic  Patient Position (if appropriate) Lying  Pulse Rate (!) 150  Pulse Rate Source Monitor  ECG Heart Rate (!) 150  Resp (!) 22  Oxygen Therapy  SpO2 99 %  O2 Device Room Air  During Treatment Monitoring  Intra-Hemodialysis Comments Tx completed;Tolerated well  Post Treatment  Dialyzer Clearance Lightly streaked  Duration of HD Treatment -hour(s) 2.5 hour(s)  Hemodialysis Intake (mL) 0 mL  Liters Processed 37.5  Fluid Removed (mL) 0 mL  Tolerated HD Treatment Yes  Fistula / Graft Left Forearm Arteriovenous vein graft  Placement Date/Time: 09/27/20 1011   Placed prior to admission: No  Orientation: Left  Access Location: Forearm  Access Type: (c) Arteriovenous vein graft  Site Condition No complications  Fistula / Graft Assessment Present;Thrill;Bruit  Drainage Description None  Hemodialysis Catheter Right Internal jugular Double lumen Permanent (Tunneled)  Placement Date/Time: 05/31/22 1223   Placed prior to admission: No  Serial / Lot #: [id number]  Expiration Date: 12/05/26  Time Out: Correct patient;Correct site;Correct procedure  Maximum sterile barrier precautions: Hand hygiene;Cap;Mask;Sterile gow...  Site Condition No complications  Blue Lumen Status Flushed;Dead end cap in place;Heparin locked  Red Lumen Status Flushed;Dead end cap in place;Heparin locked  Purple Lumen Status N/A  Catheter fill solution Heparin 1000 units/ml  Catheter fill volume (Arterial) 1.6 cc  Catheter fill volume (Venous) 1.6  Dressing Type Transparent  Dressing Status Antimicrobial disc in place;Clean, Dry, Intact  Drainage Description None  Dressing Change Due 06/08/22   Received patient in bed to unit.  Alert and oriented to self. Informed consent signed obtained from son and placed in chart.   TX duration:2.5 hours   Patient tolerated well.   Transported back to the room  Alert, without acute distress.  Hand-off given to patient's nurse.  Critical value received for Calcium 5.6- MD aware- orders placed.  HR- Sinus Tach 120's-130's on arrival to unit. Pt became agitated towards the of treatment HR 140-150's non sustained. MD aware. RN made aware.   Access used: R cath Access issues: none  Total UF removed: 0- keep even Medication(s) given: none Post HD VS: see at Post HD weight: 69.4kgs   [facility]

## 2022-06-02 NOTE — Progress Notes (Signed)
PROGRESS NOTE    Glenn Olson  ZOX:[id number] DOB: 1940-07-31 DOA: 05/30/2022 PCP: Benita Stabile, MD    Brief Narrative:   Glenn Olson is a 82 y.o. male with medical history significant for ESRD, neurogenic atonic bladder presented to hospital with headache and chest pain for 1 week.  Patient was on hemodialysis but had stopped going for hemodialysis for 1 year now.  Has been having poor oral intake with nausea and vomiting.  In the ED, patient was mildly tachypneic and tachycardic with elevated blood pressure.  Chest x-ray with increased left lower lower lobe markings. CO2 less than 7.  BUN of 241.  Potassium 4.5.  Creatinine 13.8.Marland KitchenTroponin 48 > 52.  Nephrology was consulted and decision was to proceed with hemodialysis.  Patient was then transferred to [facility].  At this time, patient has received vascular catheter placed in by vascular surgery has been started on hemodialysis..   Assessment and Plan:  * Uremia secondary to end-stage renal disease Had not been on dialysis for 1 year.   Vascular surgery has placed in tunneled hemodialysis catheter for hemodialysis and patient has been resumed on hemodialysis.  Patient was seen during hemodialysis today.  Further plans as per nephrology.  Hypokalemia.  Getting hemodialysis today could be adjusted with that.  Hypomagnesemia.  Magnesium of 1.3.  Will need to replenish   Chest pain   Nonspecific and generalized chest pain.   Resolved  Atonic neurogenic bladder Continue suprapubic catheter  Anemia of chronic kidney disease.   Hemoglobin of 7.6 today up from 6.7 yesterday after 1 unit of packed RBC transfusion.  No evidence of gross bleeding or blood loss at this time.  Iron profile shows iron of 31.  No recent ferritin.  Will add.  Hypocalcemia.  Asymptomatic.    Deconditioning, debility.  Will get PT OT evaluation..  Palliative care for goals of care discussion.   DVT prophylaxis: SCDs Start: 05/30/22 1926   Code  Status:     Code Status: DNR  Disposition:  Uncertain at this time.  Likely need skilled nursing facility placement..  Will get PT OT evaluation.  Status is: Inpatient  Remains inpatient appropriate because: Uremia need for hemodialysis, goals of care discussion.   Family Communication:  Spoke with the patient's son and daughter at bedside on 06/01/2022  Consultants:  Nephrology Vascular surgery Palliative care  Procedures:  Right internal jugular tunneled hemodialysis catheter placement Hemodialysis  Antimicrobials:  None  Anti-infectives (From admission, onward)    Start     Dose/Rate Route Frequency Ordered Stop   05/31/22 1115  ceFAZolin (ANCEF) IVPB 2g/100 mL premix        2 g 200 mL/hr over 30 Minutes Intravenous  Once 05/31/22 1108 05/31/22 1200   05/31/22 1112  ceFAZolin (ANCEF) 2-4 GM/100ML-% IVPB       Note to Pharmacy: Shanda Bumps M: cabinet override      05/31/22 1112 05/31/22 1206      Subjective: Today, patient was seen and examined at bedside.  Patient denies any nausea vomiting fever chills or rigor.  Patient's family at bedside.  Appears to be more calm and comfortable.  Objective: Vitals:   06/02/22 0830 06/02/22 0900 06/02/22 0930 06/02/22 1000  BP: (!) 141/87 (!) 135/92 (!) 147/88 (!) 144/90  Pulse: (!) 126 (!) 126 (!) 126 (!) 123  Resp: 17 17 17  (!) 22  Temp:      TempSrc:      SpO2: 100% 100% 100% 100%  Weight:      Height:        Intake/Output Summary (Last 24 hours) at 06/02/2022 1020 Last data filed at 06/02/2022 0400 Gross per 24 hour  Intake 1919.08 ml  Output 850 ml  Net 1069.08 ml    Filed Weights   05/30/22 1436 06/02/22 0818  Weight: 80.3 kg 68.8 kg    Physical Examination: Body mass index is 22.4 kg/m.   General:  Average built, not in obvious distress, little more alert awake and Communicative.  Relatively deconditioned.   HENT:   Mild pallor noted.  Oral mucosa is moist.  Chest:   Diminished breath sounds  bilaterally.  CVS: S1 &S2 heard. No murmur.  Regular rate and rhythm. Abdomen: Soft, nontender, nondistended.  Bowel sounds are heard.   Extremities: No cyanosis, clubbing or edema.   Psych: Alert, awake and Communicative, CNS:  No cranial nerve deficits.  Moving extremities. Skin: Warm and dry.  No rashes noted.  Dry skin  Data Reviewed:   CBC: Recent Labs  Lab 05/30/22 1445 05/31/22 0723 05/31/22 1117 06/01/22 0759 06/02/22 0411  WBC 9.3 6.6  --  6.5 7.6  NEUTROABS 8.3*  --   --   --   --   HGB 8.6* 7.3* 7.5* 6.7* 7.6*  HCT 25.5* 21.7* 22.0* 20.0* 21.5*  MCV 89.5 87.1  --  87.3 84.3  PLT 226 167  --  149* 136*     Basic Metabolic Panel: Recent Labs  Lab 05/30/22 1445 05/31/22 0723 05/31/22 1117 05/31/22 1138 06/01/22 0759 06/02/22 0411  NA 134* 137 137 139 140 137  K 4.5 4.2 4.1 4.0 3.3* 3.1*  CL 104 104 106 102 100 94*  CO2 <7* 8*  --  7* 12* 17*  GLUCOSE 137* 83 74 77 77 77  BUN 241* 248* >130* 257* 206* 143*  CREATININE 13.80* 13.75* 15.50* 13.97* 12.40* 9.38*  CALCIUM 6.0* 5.6*  --  5.9* 5.5* 5.6*  MG  --   --   --   --   --  1.3*  PHOS  --   --   --   --  9.3* 7.4*     Liver Function Tests: Recent Labs  Lab 05/30/22 1445 06/01/22 0759 06/02/22 0411  AST 12*  --   --   ALT 16  --   --   ALKPHOS 103  --   --   BILITOT 0.7  --   --   PROT 7.9  --   --   ALBUMIN 4.1 3.4* 3.2*      Radiology Studies: DG CHEST PORT 1 VIEW  Result Date: 05/31/2022 CLINICAL DATA:  Insertion of tunneled venous catheter EXAM: PORTABLE CHEST 1 VIEW COMPARISON:  Portable exam 1455 hours compared to 05/30/2022 FINDINGS: RIGHT jugular line tip projects over RIGHT atrium. Enlargement of cardiac silhouette. Mediastinal contours and pulmonary vascularity normal. Lungs clear. No infiltrate, pleural effusion, or pneumothorax. Osseous structures unremarkable. IMPRESSION: No pneumothorax following RIGHT jugular line insertion. Electronically Signed   By: Ulyses Southward M.D.   On:  05/31/2022 15:09   HYBRID OR IMAGING ([facility] ONLY)  Result Date: 05/31/2022 There is no interpretation for this exam.  This order is for images obtained during a surgical procedure.  Please See "Surgeries" Tab for more information regarding the procedure.      LOS: 3 days    Joycelyn Das, MD [facility] Available via Epic secure chat 7am-7pm After these hours, please refer to coverage provider listed on  [email or web address] 06/02/2022, 10:20 AM

## 2022-06-02 NOTE — Progress Notes (Signed)
Notifed Dr. Tyson Babinski of calcium new orders placed

## 2022-06-02 NOTE — Progress Notes (Signed)
PT Cancellation Note  Patient Details Name: Glenn Olson MRN: [id number] DOB: [birth year]   Cancelled Treatment:    Reason Eval/Treat Not Completed: Patient at procedure or test/unavailable - HD  [name], PT DPT [facility] Secure Chat Preferred  Office [phone]    [name] [name] 06/02/2022, 9:12 AM

## 2022-06-02 NOTE — Progress Notes (Signed)
OT Cancellation Note  Patient Details Name: Glenn Olson MRN: [id number] DOB: July 09, 1940   Cancelled Treatment:    Reason Eval/Treat Not Completed: Patient at procedure or test/ unavailable (HD)  Donia Pounds 06/02/2022, 9:20 AM

## 2022-06-02 NOTE — Progress Notes (Signed)
Patient to dialysis at this time.

## 2022-06-02 NOTE — Progress Notes (Signed)
Date and time results received: 06/02/22 10:11 (use smartphrase ".now" to insert current time)  Test: calcium Critical Value: 5.6  Name of Provider Notified: DR. Arrie Aran paged  Orders Received? Or Actions Taken?:  pt  currently on HD machine continue with current plan of care

## 2022-06-02 NOTE — Progress Notes (Signed)
Patient returns from dialysis at this time 

## 2022-06-02 NOTE — Procedures (Signed)
I was present at this dialysis session. I have reviewed the session itself and made appropriate changes.   Vital signs in last 24 hours:  Temp:  [97.6 F (36.4 C)-98.7 F (37.1 C)] 97.7 F (36.5 C) (05/04 0344) Pulse Rate:  [30-139] 126 (05/04 0830) Resp:  [13-20] 17 (05/04 0830) BP: (132-164)/(82-110) 141/87 (05/04 0830) SpO2:  [97 %-100 %] 100 % (05/04 0830) Weight:  [68.8 kg] 68.8 kg (05/04 0818) Weight change:  Filed Weights   05/30/22 1436 06/02/22 0818  Weight: 80.3 kg 68.8 kg    Recent Labs  Lab 06/02/22 0411  NA 137  K 3.1*  CL 94*  CO2 17*  GLUCOSE 77  BUN 143*  CREATININE 9.38*  CALCIUM PENDING  PHOS 7.4*    Recent Labs  Lab 05/30/22 1445 05/31/22 0723 05/31/22 1117 06/01/22 0759 06/02/22 0411  WBC 9.3 6.6  --  6.5 7.6  NEUTROABS 8.3*  --   --   --   --   HGB 8.6* 7.3* 7.5* 6.7* 7.6*  HCT 25.5* 21.7* 22.0* 20.0* 21.5*  MCV 89.5 87.1  --  87.3 84.3  PLT 226 167  --  149* 136*    Scheduled Meds:  sodium chloride   Intravenous Once   calcium acetate  1,334 mg Oral TID WC   Chlorhexidine Gluconate Cloth  6 each Topical Q0600   darbepoetin (ARANESP) injection - DIALYSIS  40 mcg Subcutaneous Q Fri-1800   Continuous Infusions:  sodium bicarbonate 150 mEq in sterile water 1,150 mL infusion 100 mL/hr at 06/01/22 2252   PRN Meds:.acetaminophen **OR** acetaminophen, HYDROmorphone (DILAUDID) injection, labetalol, polyethylene glycol, promethazine     A ESRD and uremia: HD catheter placed 5/2, HD session on 5/2 cut short to 30 minutes due to patient agitation.  HD 06/01/22 cut short 15 min due to machine error. Anemia of CKD V: Hgb 7.5>6.7 today. Primary team has ordered I Unit RBCs w/ HD today. Iron 31, TIBC and saturation ratios low.  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  Neurogenic bladder Acute metabolic encephalopathy/uremia   P ESRD and uremia: Plan for second session of HD today with shortened session to avoid dialysis dysequilibrium syndrome.  Family  members are aware that if patient cannot tolerate HD today, will need to consider palliative consult.  He was agitated 06/01/22 and needed a sitter.  He appears a little more calm this morning.  Will see how he does.  He has a history of not wanting HD and may change his mind when he is no longer uremic. Anemia of CKD V: 1 U RBCs w/ HD. CTM hgb. Will start Aranesp 40 mcg weekly.  Vascular access - he has a functioning AVG in his left forearm, as well as a RIJ TDC.  Given his confusion, would not be safe to use AVG for now.  Will address this in the future if he tolerates HD. If he becomes agitated again with HD, would then recommend transitioning to comfort care. 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 Morphine should not be used.  Baclofen should be avoided Avoid oral sodium phosphate and magnesium citrate based laxatives / bowel preps   Irena Cords,  MD 06/02/2022, 8:40 AM

## 2022-06-03 DIAGNOSIS — Z515 Encounter for palliative care: Secondary | ICD-10-CM | POA: Diagnosis not present

## 2022-06-03 DIAGNOSIS — N186 End stage renal disease: Secondary | ICD-10-CM | POA: Diagnosis not present

## 2022-06-03 DIAGNOSIS — N19 Unspecified kidney failure: Secondary | ICD-10-CM | POA: Diagnosis not present

## 2022-06-03 DIAGNOSIS — N312 Flaccid neuropathic bladder, not elsewhere classified: Secondary | ICD-10-CM | POA: Diagnosis not present

## 2022-06-03 DIAGNOSIS — Z7189 Other specified counseling: Secondary | ICD-10-CM | POA: Diagnosis not present

## 2022-06-03 DIAGNOSIS — E872 Acidosis, unspecified: Secondary | ICD-10-CM | POA: Diagnosis not present

## 2022-06-03 DIAGNOSIS — R079 Chest pain, unspecified: Secondary | ICD-10-CM | POA: Diagnosis not present

## 2022-06-03 DIAGNOSIS — Z66 Do not resuscitate: Secondary | ICD-10-CM | POA: Diagnosis not present

## 2022-06-03 LAB — CBC
HCT: 21.1 % — ABNORMAL LOW (ref 39.0–52.0)
Hemoglobin: 7.1 g/dL — ABNORMAL LOW (ref 13.0–17.0)
MCH: 29.5 pg (ref 26.0–34.0)
MCHC: 33.6 g/dL (ref 30.0–36.0)
MCV: 87.6 fL (ref 80.0–100.0)
Platelets: 128 10*3/uL — ABNORMAL LOW (ref 150–400)
RBC: 2.41 MIL/uL — ABNORMAL LOW (ref 4.22–5.81)
RDW: 15.6 % — ABNORMAL HIGH (ref 11.5–15.5)
WBC: 6.7 10*3/uL (ref 4.0–10.5)
nRBC: 0 % (ref 0.0–0.2)

## 2022-06-03 LAB — BASIC METABOLIC PANEL
Anion gap: 19 — ABNORMAL HIGH (ref 5–15)
BUN: 77 mg/dL — ABNORMAL HIGH (ref 8–23)
CO2: 23 mmol/L (ref 22–32)
Calcium: 5.9 mg/dL — CL (ref 8.9–10.3)
Chloride: 94 mmol/L — ABNORMAL LOW (ref 98–111)
Creatinine, Ser: 6.38 mg/dL — ABNORMAL HIGH (ref 0.61–1.24)
GFR, Estimated: 8 mL/min — ABNORMAL LOW (ref >60)
Glucose, Bld: 68 mg/dL — ABNORMAL LOW (ref 70–99)
Potassium: 2.4 mmol/L — CL (ref 3.5–5.1)
Sodium: 136 mmol/L (ref 135–145)

## 2022-06-03 LAB — MAGNESIUM: Magnesium: 1.8 mg/dL (ref 1.7–2.4)

## 2022-06-03 MED ORDER — LORATADINE 10 MG PO TABS
10.0000 mg | ORAL_TABLET | Freq: Every day | ORAL | Status: DC
  Administered 2022-06-03 – 2022-06-20 (×17): 10 mg via ORAL
  Filled 2022-06-03 (×18): qty 1

## 2022-06-03 MED ORDER — SODIUM CHLORIDE 0.9 % IV SOLN
125.0000 mg | INTRAVENOUS | Status: DC
  Administered 2022-06-04 – 2022-06-08 (×3): 125 mg via INTRAVENOUS
  Filled 2022-06-03 (×6): qty 10

## 2022-06-03 MED ORDER — POTASSIUM CHLORIDE 10 MEQ/100ML IV SOLN
10.0000 meq | INTRAVENOUS | Status: AC
  Administered 2022-06-03 (×2): 10 meq via INTRAVENOUS
  Filled 2022-06-03 (×2): qty 100

## 2022-06-03 MED ORDER — CALCIUM GLUCONATE-NACL 1-0.675 GM/50ML-% IV SOLN
1.0000 g | Freq: Once | INTRAVENOUS | Status: AC
  Administered 2022-06-03: 1000 mg via INTRAVENOUS
  Filled 2022-06-03: qty 50

## 2022-06-03 MED ORDER — POTASSIUM CHLORIDE 20 MEQ PO PACK
40.0000 meq | PACK | Freq: Once | ORAL | Status: AC
  Administered 2022-06-03: 40 meq via ORAL
  Filled 2022-06-03: qty 2

## 2022-06-03 NOTE — Consult Note (Signed)
Palliative Care Consult Note                                  Date: 06/03/2022   Patient Name: Glenn Olson  DOB: 11/25/1940  MRN: [id number]  Age / Sex: 82 y.o., male  PCP: Benita Stabile, MD Referring Physician: Joycelyn Das, MD  Reason for Consultation: Establishing goals of care  HPI/Patient Profile: 82 y.o. male  with past medical history of ESRD, neurogenic atonic bladder he presented to hospital with headache and chest pain for 1 week. Patient was on hemodialysis but had stopped going for hemodialysis for 1 year now. Has been having poor oral intake with nausea and vomiting.  He was admitted on 05/30/2022 with uremia secondary to end-stage renal disease, hyperkalemia, chest pain, anemia of chronic disease, deconditioning and debility, and others.   PMT was consulted for GOC conversations.  Past Medical History:  Diagnosis Date   Anemia    Chronic kidney disease    Dysrhythmia    GERD (gastroesophageal reflux disease)    Neurogenic bladder     Subjective:   This NP Wynne Dust reviewed medical records, received report from team, assessed the patient and then meet at the patient's bedside to discuss diagnosis, prognosis, GOC, EOL wishes disposition and options.  I met with the patient at the bedside.  He is awake and alert, intermittently falls asleep and seems quite fatigued.  Also present was the patient's son Nadine Counts, daughter Misty Stanley.   Concept of Palliative Care was introduced as specialized medical care for people and their families living with serious illness.  If focuses on providing relief from the symptoms and stress of a serious illness.  The goal is to improve quality of life for both the patient and the family. Values and goals of care important to patient and family were attempted to be elicited.  Created space and opportunity for patient  and family to explore thoughts and feelings regarding current medical situation    Natural trajectory and current clinical status were discussed. Questions and concerns addressed. Patient  encouraged to call with questions or concerns.    Patient/Family Understanding of Illness: They understand he has kidney problems.  He has been telling him "I am miserable".  He started hemodialysis a year ago but did not like it so he stopped.  He also has bladder issues, which actually led to his kidney damage.  They tried to arrange for home dialysis but because of a history of colon cancer and scar tissue he was not a candidate.  1 month ago they noted significant decline and decreased function.  On Wednesday he was in substantial pain but refused to go to the ED until the pain was bad enough that he finally agreed.  We had further discussion on his chronic, acute presentations related to affect each other given his prognosis.  Life Review: The patient has a son Nadine Counts whom he lives with.  He also has a daughter Misty Stanley who lives in [place].  Goals: Continue dialysis, ongoing goals of care  Today's Discussion: In addition to discussions described above we had substantial discussion of various topics.  We discussed his reasoning for stopping dialysis.  When he started dialysis he was feeling okay, but after starting dialysis he was felt terrible, fatigue, cramping, nausea.  At this point he decided to stop dialysis because it made him feel poorly.  He went 14 months without  dialysis and remarkably was still relatively functional.  Until a month ago he started having significant decline and then a few days ago was admitted with severe pain, nausea, vomiting.  We discussed kidney disease and how we cannot live without functional kidneys, if not on dialysis.  We discussed the ability and right for each person to make decisions for themselves.  At this point the patient has agreed to continue dialysis.  Family told him that if he wants to do dialysis he needs to stick with it.  We discussed that at some  point he may again decide to not do dialysis and at that point he would be appropriate for transition to comfort care and hospice services.  We discussed that any decision he makes is okay, but we need to ensure that he has the appropriate support for which ever decision he makes.  For now we have agreed to allow time for outcomes to see if he tolerates this.  They are also very if for some reason he does not tolerate dialysis and he would need to consider transition to comfort care as well.  The patient and family at this point seem motivated to continue doing what is needed to keep him going.   We did discuss CODE STATUS.  They confirmed that the patient would like to be a DNR.  Otherwise, are accepting of full scope of treatment.  I provided emotional and general support through therapeutic listening, empathy, sharing of stories, and other techniques. I answered all questions and addressed all concerns to the best of my ability.  Review of Systems  Constitutional:  Positive for fatigue.  Respiratory:  Negative for shortness of breath.   Cardiovascular:  Negative for chest pain.  Gastrointestinal:  Negative for abdominal pain, nausea and vomiting.  Neurological:  Positive for weakness.    Objective:   Primary Diagnoses: Present on Admission:  Uremia  Atonic neurogenic bladder  Metabolic acidosis  Chest pain   Physical Exam Vitals and nursing note reviewed.  Constitutional:      General: He is sleeping. He is not in acute distress. HENT:     Head: Normocephalic and atraumatic.  Cardiovascular:     Rate and Rhythm: Normal rate.  Pulmonary:     Effort: Pulmonary effort is normal. No respiratory distress.  Abdominal:     General: Abdomen is flat.     Palpations: Abdomen is soft.  Skin:    General: Skin is warm and dry.  Neurological:     General: No focal deficit present.     Mental Status: He is easily aroused.  Psychiatric:        Mood and Affect: Mood normal.         Behavior: Behavior normal.     Vital Signs:  BP 139/73 (BP Location: Right Arm)   Pulse 86   Temp 97.9 F (36.6 C) (Oral)   Resp 17   Ht 5\' 9"  (1.753 m)   Wt 69.4 kg   SpO2 95%   BMI 22.59 kg/m   Palliative Assessment/Data: 40-50%    Advanced Care Planning:   Existing Vynca/ACP Documentation: None  Primary Decision Maker: PATIENT  Code Status/Advance Care Planning: DNR  A discussion was had today regarding advanced directives. Concepts specific to code status, artifical feeding and hydration, continued IV antibiotics and rehospitalization was had.  The difference between a aggressive medical intervention path and a palliative comfort care path for this patient at this time was had.   Decisions/Changes  to ACP: None today  Assessment & Plan:   Impression: 82 year old male with chronic comorbidities and acute presentations as described above.  The patient is uremic with end-stage renal disease.  He stopped dialysis 14 months ago was admitted with elevated creatinine of 15.5.  He has had a couple sessions of dialysis, both of which had to stop early even from agitation or from machine error.  Today's creatinine has improved to 6.38.  The patient has agreed to restart dialysis and family supportive of this decision.  We did discuss that if he decides to not continue dialysis or if he is not tolerating dialysis and transition to comfort care and consideration of hospice services would be appropriate.  Overall prognosis guarded to poor.  SUMMARY OF RECOMMENDATIONS   Remain DNR Full scope of care otherwise Continued hemodialysis If not tolerating dialysis or significant agitation (per nephrology) consider rediscussing goals of care for comfort care and/or hospice services PMT will follow-up on 06/05/2022 Please call for any significant clinical change or new palliative needs prior to that  Symptom Management:  Primary team PMT is available to assist as needed  Prognosis:   Unable to determine  Discharge Planning:  To Be Determined   Discussed with: Patient, family, medical team, nursing team    Thank you for allowing [place] to participate in the care of Dj Mccranie PMT will continue to support holistically.  Time Total: 80 min  Greater than 50%  of this time was spent counseling and coordinating care related to the above assessment and plan.  Signed by: Wynne Dust, NP Palliative Medicine Team  Team Phone # [phone] (Nights/Weekends)  06/03/2022, 2:00 PM

## 2022-06-03 NOTE — Progress Notes (Signed)
SCD placed per order. 

## 2022-06-03 NOTE — Progress Notes (Signed)
OT Cancellation Note  Patient Details Name: Glenn Olson MRN: [id number] DOB: Jan 18, 1941   Cancelled Treatment:    Reason Eval/Treat Not Completed: Fatigue/lethargy limiting ability to participate (Per family, pt just returned from session with PT and is exhausted. OT evaluation to f/u tomorrow morning.)  Donia Pounds 06/03/2022, 1:03 PM

## 2022-06-03 NOTE — Progress Notes (Signed)
Patient ID: Dreydan Sotto, male   DOB: 11/15/1940, 82 y.o.   MRN: [id number] S: More awake and alert this morning.  Poor appetite.  Family at bedside.  Reports that he has nasal congestion.  He also states that he is willing to continue with IHD (since he had signed off of HD a year ago). O:BP 139/73 (BP Location: Right Arm)   Pulse 86   Temp 97.9 F (36.6 C) (Oral)   Resp 17   Ht 5\' 9"  (1.753 m)   Wt 69.4 kg   SpO2 95%   BMI 22.59 kg/m   Intake/Output Summary (Last 24 hours) at 06/03/2022 1018 Last data filed at 06/03/2022 7829 Gross per 24 hour  Intake 901.23 ml  Output 900 ml  Net 1.23 ml   Intake/Output: I/O last 3 completed shifts: In: 2820.3 [I.V.:2266.4; Blood:345.8; IV Piggyback:208.1] Out: 1550 [Urine:1550]  Intake/Output this shift:  No intake/output data recorded. Weight change:  Gen: NAD CVS: RRR  Resp:CTA Abd:+BS, soft, NT/ND Ext: no edema, left forearm AVG +T/B  Recent Labs  Lab 05/30/22 1445 05/31/22 0723 05/31/22 1117 05/31/22 1138 06/01/22 0759 06/02/22 0411 06/03/22 0248  NA 134* 137 137 139 140 137 136  K 4.5 4.2 4.1 4.0 3.3* 3.1* 2.4*  CL 104 104 106 102 100 94* 94*  CO2 <7* 8*  --  7* 12* 17* 23  GLUCOSE 137* 83 74 77 77 77 68*  BUN 241* 248* >130* 257* 206* 143* 77*  CREATININE 13.80* 13.75* 15.50* 13.97* 12.40* 9.38* 6.38*  ALBUMIN 4.1  --   --   --  3.4* 3.2*  --   CALCIUM 6.0* 5.6*  --  5.9* 5.5* 5.6* 5.9*  PHOS  --   --   --   --  9.3* 7.4*  --   AST 12*  --   --   --   --   --   --   ALT 16  --   --   --   --   --   --    Liver Function Tests: Recent Labs  Lab 05/30/22 1445 06/01/22 0759 06/02/22 0411  AST 12*  --   --   ALT 16  --   --   ALKPHOS 103  --   --   BILITOT 0.7  --   --   PROT 7.9  --   --   ALBUMIN 4.1 3.4* 3.2*   No results for input(s): "LIPASE", "AMYLASE" in the last 168 hours. No results for input(s): "AMMONIA" in the last 168 hours. CBC: Recent Labs  Lab 05/30/22 1445 05/31/22 0723 05/31/22 1117  06/01/22 0759 06/02/22 0411 06/03/22 0248  WBC 9.3 6.6  --  6.5 7.6 6.7  NEUTROABS 8.3*  --   --   --   --   --   HGB 8.6* 7.3*   < > 6.7* 7.6* 7.1*  HCT 25.5* 21.7*   < > 20.0* 21.5* 21.1*  MCV 89.5 87.1  --  87.3 84.3 87.6  PLT 226 167  --  149* 136* 128*   < > = values in this interval not displayed.   Cardiac Enzymes: No results for input(s): "CKTOTAL", "CKMB", "CKMBINDEX", "TROPONINI" in the last 168 hours. CBG: Recent Labs  Lab 05/31/22 2257 06/01/22 0059 06/01/22 2044  GLUCAP 64* 71 76    Iron Studies:  Recent Labs    06/01/22 0759  IRON 31*  TIBC 197*   Studies/Results: No results found.  sodium chloride  Intravenous Once   calcium acetate  1,334 mg Oral TID WC   Chlorhexidine Gluconate Cloth  6 each Topical Q0600   darbepoetin (ARANESP) injection - DIALYSIS  40 mcg Subcutaneous Q Fri-1800    BMET    Component Value Date/Time   NA 136 06/03/2022 0248   K 2.4 (LL) 06/03/2022 0248   CL 94 (L) 06/03/2022 0248   CO2 23 06/03/2022 0248   GLUCOSE 68 (L) 06/03/2022 0248   BUN 77 (H) 06/03/2022 0248   CREATININE 6.38 (H) 06/03/2022 0248   CALCIUM 5.9 (LL) 06/03/2022 0248   GFRNONAA 8 (L) 06/03/2022 0248   CBC    Component Value Date/Time   WBC 6.7 06/03/2022 0248   RBC 2.41 (L) 06/03/2022 0248   HGB 7.1 (L) 06/03/2022 0248   HCT 21.1 (L) 06/03/2022 0248   PLT 128 (L) 06/03/2022 0248   MCV 87.6 06/03/2022 0248   MCH 29.5 06/03/2022 0248   MCHC 33.6 06/03/2022 0248   RDW 15.6 (H) 06/03/2022 0248   LYMPHSABS 0.5 (L) 05/30/2022 1445   MONOABS 0.3 05/30/2022 1445   EOSABS 0.1 05/30/2022 1445   BASOSABS 0.0 05/30/2022 1445     A ESRD and uremia: HD catheter placed 5/2, HD session on 5/2 cut short to 30 minutes due to patient agitation.  HD 06/01/22 cut short 15 min due to machine error. Anemia of CKD V: Hgb 7.5>6.7>7.1 today. Primary team has ordered I Unit RBCs w/ HD today. Iron 31, TIBC and saturation ratios low.  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   Neurogenic bladder Acute metabolic encephalopathy/uremia Hypokalemia Hypomagnesemia  Metabolic acidosis due to uremia Hypocalcemia  CKD-MBD   P ESRD and uremia: Plan for second session of HD today with shortened session to avoid dialysis dysequilibrium syndrome.  Family members are aware that if patient cannot tolerate HD today, will need to consider palliative consult.  He was agitated 06/01/22 and needed a sitter.  He tolerated HD well on 06/02/22 without the need for sitter.  He is no longer uremic and agrees to ongoing HD.  Will plan for HD tomorrow and will need outpatient arrangements at [facility] when stable for discharge. Anemia of CKD V: 1 U RBCs w/ HD. CTM hgb. Started Aranesp 40 mcg weekly.  Transfuse prn.  Will order IV iron Vascular access - he has a functioning AVG in his left forearm, as well as a RIJ TDC.  Given his confusion, would not be safe to use AVG initially.  He does not want to use AVG at this time.  Will continue with [facility] for now.  Will address this in the future if he tolerates HD. If he becomes agitated or cannot tolerate HD, would then recommend transitioning to comfort care. 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 Mg repleted IV yesterday Will stop isotonic bicarb drip as it has resolved. Given IV calcium and started on phoslo 2 qac and follow phos and ca Medication Issues; Preferred narcotic agents for pain control are hydromorphone, fentanyl, and methadone. Morphine should not be used.  Baclofen should be avoided Avoid oral sodium phosphate and magnesium citrate based laxatives / bowel preps   Irena Cords, MD [facility]

## 2022-06-03 NOTE — Progress Notes (Signed)
PROGRESS NOTE    Glenn Olson  ZOX:[id number] DOB: 07-Dec-1940 DOA: 05/30/2022 PCP: Benita Stabile, MD    Brief Narrative:   Glenn Olson is a 82 y.o. male with medical history significant for ESRD, neurogenic atonic bladder presented to hospital with headache and chest pain for 1 week.  Patient was on hemodialysis but had stopped going for hemodialysis for 1 year now.  Has been having poor oral intake with nausea and vomiting.  In the ED, patient was mildly tachypneic and tachycardic with elevated blood pressure.  Chest x-ray with increased left lower lower lobe markings. CO2 less than 7.  BUN of 241.  Potassium 4.5.  Creatinine 13.8.Marland KitchenTroponin 48 > 52.  Nephrology was consulted and decision was to proceed with hemodialysis.  Patient was then transferred to [facility].  At this time, patient has received vascular catheter placed in by vascular surgery has been started on hemodialysis.   Assessment and Plan:  * Uremia secondary to end-stage renal disease Had not been on dialysis for 1 year.   Vascular surgery has placed in tunneled hemodialysis catheter for hemodialysis and patient has been resumed on hemodialysis.   Further plans as per nephrology.  Hypokalemia.   Significant hypokalemia with potassium of 2.4.  Continue oral potassium.  Hypocalcemia.  Received calcium gluconate.  Hypomagnesemia.  Magnesium of 1.3 on 06/02/2022.  Magnesium sulfate 2 g x 1.   Chest pain   Nonspecific and generalized chest pain.   Resolved  Atonic neurogenic bladder Continue suprapubic catheter  Anemia of chronic kidney disease.   Hemoglobin of 7.1 today up from 6.7  after 1 unit of packed RBC transfusion.  No evidence of gross bleeding or blood loss at this time.  Iron profile shows iron of 31.  Continue iron transfusion.  Deconditioning, debility.  Pending PT, OT evaluation..  Palliative care for goals of care discussion.   DVT prophylaxis: SCDs Start: 05/30/22 1926   Code Status:     Code  Status: DNR  Disposition:  Uncertain at this time.  Likely need skilled nursing facility placement..  Pending PT OT evaluation.  Status is: Inpatient  Remains inpatient appropriate because: Uremia need for hemodialysis, goals of care discussion.   Family Communication:  Spoke with multiple family members including son and daughter at bedside.  Consultants:  Nephrology Vascular surgery Palliative care  Procedures:  Right internal jugular tunneled hemodialysis catheter placement Hemodialysis  Antimicrobials:  None  Anti-infectives (From admission, onward)    Start     Dose/Rate Route Frequency Ordered Stop   05/31/22 1115  ceFAZolin (ANCEF) IVPB 2g/100 mL premix        2 g 200 mL/hr over 30 Minutes Intravenous  Once 05/31/22 1108 05/31/22 1200   05/31/22 1112  ceFAZolin (ANCEF) 2-4 GM/100ML-% IVPB       Note to Pharmacy: Shanda Bumps M: cabinet override      05/31/22 1112 05/31/22 1206      Subjective: Today, patient was seen and examined at bedside.  Denies any nausea vomiting fever chills or rigor.  Multiple family members at bedside.  Patient could not sleep in the night yesterday and is slightly sleepy this morning.    Objective: Vitals:   06/02/22 1234 06/02/22 1606 06/02/22 2205 06/03/22 0846  BP: (!) 151/88 128/70 (!) 107/56 139/73  Pulse: (!) 41 87 89 86  Resp: 20  20 17   Temp:   98.7 F (37.1 C) 97.9 F (36.6 C)  TempSrc:    Oral  SpO2: 92% 98% 100% 95%  Weight:      Height:        Intake/Output Summary (Last 24 hours) at 06/03/2022 1051 Last data filed at 06/03/2022 1610 Gross per 24 hour  Intake 901.23 ml  Output 900 ml  Net 1.23 ml    Filed Weights   05/30/22 1436 06/02/22 0818 06/02/22 1057  Weight: 80.3 kg 68.8 kg 69.4 kg    Physical Examination: Body mass index is 22.59 kg/m.   General: Mildly sleepy this morning, Communicative, no nausea vomiting fever chills or rigors.   HENT:   Mild pallor noted.  Oral mucosa is moist.  Chest:    Diminished breath sounds bilaterally.  CVS: S1 &S2 heard. No murmur.  Regular rate and rhythm. Abdomen: Soft, nontender, nondistended.  Bowel sounds are heard.   Extremities: No cyanosis, clubbing or edema.   Psych: Alert, awake and Communicative, CNS:  No cranial nerve deficits.  Moving extremities. Skin: Warm and dry.  No rashes noted.  Dry skin  Data Reviewed:   CBC: Recent Labs  Lab 05/30/22 1445 05/31/22 0723 05/31/22 1117 06/01/22 0759 06/02/22 0411 06/03/22 0248  WBC 9.3 6.6  --  6.5 7.6 6.7  NEUTROABS 8.3*  --   --   --   --   --   HGB 8.6* 7.3* 7.5* 6.7* 7.6* 7.1*  HCT 25.5* 21.7* 22.0* 20.0* 21.5* 21.1*  MCV 89.5 87.1  --  87.3 84.3 87.6  PLT 226 167  --  149* 136* 128*     Basic Metabolic Panel: Recent Labs  Lab 05/31/22 0723 05/31/22 1117 05/31/22 1138 06/01/22 0759 06/02/22 0411 06/03/22 0248  NA 137 137 139 140 137 136  K 4.2 4.1 4.0 3.3* 3.1* 2.4*  CL 104 106 102 100 94* 94*  CO2 8*  --  7* 12* 17* 23  GLUCOSE 83 74 77 77 77 68*  BUN 248* >130* 257* 206* 143* 77*  CREATININE 13.75* 15.50* 13.97* 12.40* 9.38* 6.38*  CALCIUM 5.6*  --  5.9* 5.5* 5.6* 5.9*  MG  --   --   --   --  1.3* 1.8  PHOS  --   --   --  9.3* 7.4*  --      Liver Function Tests: Recent Labs  Lab 05/30/22 1445 06/01/22 0759 06/02/22 0411  AST 12*  --   --   ALT 16  --   --   ALKPHOS 103  --   --   BILITOT 0.7  --   --   PROT 7.9  --   --   ALBUMIN 4.1 3.4* 3.2*      Radiology Studies: No results found.    LOS: 4 days    Joycelyn Das, MD [facility] Available via Epic secure chat 7am-7pm After these hours, please refer to coverage provider listed on [email or web address] 06/03/2022, 10:51 AM

## 2022-06-03 NOTE — Evaluation (Signed)
Physical Therapy Evaluation Patient Details Name: Hasheem Osoria MRN: [id number] DOB: 06/18/1940 Today's Date: 06/03/2022  History of Present Illness  Kolden Bernert is a 82 y.o. male admitted 5/1  presented to hospital with headache and chest pain for 1 week.  Patient was on hemodialysis but had stopped going for hemodialysis for 1 year now.  Has been having poor oral intake with nausea and vomiting.  In the ED, patient was mildly tachypneic and tachycardic with elevated blood pressure as well as hypokalemia.  Chest x-ray with increased left lower lower lobe markings.  HD reinitiated.  PMH: ESRD, neurogenic atonic bladder  Clinical Impression  Pt admitted with above diagnosis. Pt was able to ambulate with RW with mod assist for safety due to impulsivity and incr cues as well as pt with fatigue quickly. Pt doesn't have 24 hour care and lived alone PTA. Would benefit from therapy <2 hours day on d/c.  Will follow acutely.  Pt currently with functional limitations due to the deficits listed below (see PT Problem List). Pt will benefit from acute skilled PT to increase their independence and safety with mobility to allow discharge.          Recommendations for follow up therapy are one component of a multi-disciplinary discharge planning process, led by the attending physician.  Recommendations may be updated based on patient status, additional functional criteria and insurance authorization.  Follow Up Recommendations Can patient physically be transported by private vehicle: No     Assistance Recommended at Discharge Frequent or constant Supervision/Assistance  Patient can return home with the following  A lot of help with walking and/or transfers;A lot of help with bathing/dressing/bathroom;Assistance with cooking/housework;Assist for transportation;Help with stairs or ramp for entrance    Equipment Recommendations Other (comment) (TBA)  Recommendations for Other Services       Functional  Status Assessment Patient has had a recent decline in their functional status and demonstrates the ability to make significant improvements in function in a reasonable and predictable amount of time.     Precautions / Restrictions Precautions Precautions: Fall Restrictions Weight Bearing Restrictions: No      Mobility  Bed Mobility Overal bed mobility: Needs Assistance Bed Mobility: Supine to Sit     Supine to sit: Min assist     General bed mobility comments: Needed assist for lines and safety as pt is impulsive.    Transfers Overall transfer level: Needs assistance Equipment used: Rolling walker (2 wheels) Transfers: Sit to/from Stand Sit to Stand: Min assist           General transfer comment: Assist for lines and for safety to RW as pt impulsive with all movement.    Ambulation/Gait Ambulation/Gait assistance: Mod assist Gait Distance (Feet): 80 Feet Assistive device: Rolling walker (2 wheels) Gait Pattern/deviations: Step-through pattern, Decreased stride length, Trunk flexed, Drifts right/left, Leaning posteriorly   Gait velocity interpretation: 1.31 - 2.62 ft/sec, indicative of limited community ambulator   General Gait Details: Pt needed mod cues for proximity to RW as he tends to push it too far forward at times. Pt also impulsive and needed assist for safety. Pt fatigued quickly and wanted to turn around and return to bed due to fatigue. Once pt back, fell asleep quickly.  Stairs            Wheelchair Mobility    Modified Rankin (Stroke Patients Only)       Balance Overall balance assessment: Needs assistance Sitting-balance support: No upper extremity supported, Feet  supported Sitting balance-Leahy Scale: Fair     Standing balance support: Bilateral upper extremity supported, During functional activity, Reliant on assistive device for balance Standing balance-Leahy Scale: Poor Standing balance comment: relies on UE support on RW                              Pertinent Vitals/Pain Pain Assessment Pain Assessment: No/denies pain    Home Living Family/patient expects to be discharged to:: Skilled nursing facility Living Arrangements: Children Available Help at Discharge: Family;Available PRN/intermittently (children work) Type of Home: House           Home Equipment: Agricultural consultant (2 wheels);Cane - single point;Shower seat      Prior Function Prior Level of Function : Independent/Modified Independent             Mobility Comments: independent with RW  when he had pain per son ADLs Comments: Independent with ADLs     Hand Dominance   Dominant Hand: Right    Extremity/Trunk Assessment   Upper Extremity Assessment Upper Extremity Assessment: Defer to OT evaluation    Lower Extremity Assessment Lower Extremity Assessment: Generalized weakness    Cervical / Trunk Assessment Cervical / Trunk Assessment: Kyphotic  Communication   Communication: No difficulties  Cognition Arousal/Alertness: Lethargic Behavior During Therapy: Flat affect, Restless, Impulsive Overall Cognitive Status: Impaired/Different from baseline Area of Impairment: Following commands, Safety/judgement, Problem solving, Awareness, Attention                   Current Attention Level: Focused   Following Commands: Follows one step commands with increased time Safety/Judgement: Decreased awareness of safety, Decreased awareness of deficits   Problem Solving: Difficulty sequencing, Requires verbal cues, Requires tactile cues General Comments: Pt impulsive with poor safety awareness.        General Comments General comments (skin integrity, edema, etc.): VSS    Exercises     Assessment/Plan    PT Assessment Patient needs continued PT services  PT Problem List Decreased activity tolerance;Decreased balance;Decreased mobility;Decreased knowledge of use of DME       PT Treatment Interventions DME  instruction;Gait training;Functional mobility training;Therapeutic activities;Therapeutic exercise;Balance training;Patient/family education    PT Goals (Current goals can be found in the Care Plan section)  Acute Rehab PT Goals Patient Stated Goal: to go to a SNF and then home PT Goal Formulation: With patient/family Time For Goal Achievement: 06/17/22 Potential to Achieve Goals: Fair    Frequency Min 2X/week     Co-evaluation               AM-PAC PT "6 Clicks" Mobility  Outcome Measure Help needed turning from your back to your side while in a flat bed without using bedrails?: A Little Help needed moving from lying on your back to sitting on the side of a flat bed without using bedrails?: A Little Help needed moving to and from a bed to a chair (including a wheelchair)?: A Lot Help needed standing up from a chair using your arms (e.g., wheelchair or bedside chair)?: A Little Help needed to walk in hospital room?: A Lot Help needed climbing 3-5 steps with a railing? : A Lot 6 Click Score: 15    End of Session Equipment Utilized During Treatment: Gait belt Activity Tolerance: Patient limited by fatigue Patient left: in bed;with call bell/phone within reach;with bed alarm set;with family/visitor present Nurse Communication: Mobility status;Need for lift equipment PT Visit Diagnosis:  Unsteadiness on feet (R26.81);Muscle weakness (generalized) (M62.81)    Time: 8413-2440 PT Time Calculation (min) (ACUTE ONLY): 22 min   Charges:   PT Evaluation $PT Eval Moderate Complexity: 1 Mod          Lenorris Karger M,PT Acute Rehab Services [phone]   Bevelyn Buckles 06/03/2022, 3:18 PM

## 2022-06-03 NOTE — Progress Notes (Signed)
Latest Reference Range & Units 06/03/22 02:48  BASIC METABOLIC PANEL  Rpt !!  Sodium 135 - 145 mmol/L 136  Potassium 3.5 - 5.1 mmol/L 2.4 (LL)  Chloride 98 - 111 mmol/L 94 (L)  CO2 22 - 32 mmol/L 23  Glucose 70 - 99 mg/dL 68 (L)  BUN 8 - 23 mg/dL 77 (H)  Creatinine 4.09 - 1.24 mg/dL 8.11 (H)  Calcium 8.9 - 10.3 mg/dL 5.9 (LL)  Anion gap 5 - 15  19 (H)  Magnesium 1.7 - 2.4 mg/dL 1.8  GFR, Estimated >91 mL/min 8 (L)  WBC 4.0 - 10.5 K/uL 6.7  RBC 4.22 - 5.81 MIL/uL 2.41 (L)  Hemoglobin 13.0 - 17.0 g/dL 7.1 (L)  HCT 47.8 - 29.5 % 21.1 (L)  MCV 80.0 - 100.0 fL 87.6  MCH 26.0 - 34.0 pg 29.5  MCHC 30.0 - 36.0 g/dL 62.1  RDW 30.8 - 65.7 % 15.6 (H)  Platelets 150 - 400 K/uL 128 (L)  nRBC 0.0 - 0.2 % 0.0  !!: Data is critical (LL): Data is critically low (L): Data is abnormally low (H): Data is abnormally high Rpt: View report in Results Review for more information  Patient had critical labs for potassium 2.4, and Calcium 5.9. On call MD was notified. See mar for new order details.

## 2022-06-04 DIAGNOSIS — E872 Acidosis, unspecified: Secondary | ICD-10-CM | POA: Diagnosis not present

## 2022-06-04 DIAGNOSIS — N312 Flaccid neuropathic bladder, not elsewhere classified: Secondary | ICD-10-CM | POA: Diagnosis not present

## 2022-06-04 DIAGNOSIS — N19 Unspecified kidney failure: Secondary | ICD-10-CM | POA: Diagnosis not present

## 2022-06-04 DIAGNOSIS — R079 Chest pain, unspecified: Secondary | ICD-10-CM | POA: Diagnosis not present

## 2022-06-04 LAB — BASIC METABOLIC PANEL
Anion gap: 20 — ABNORMAL HIGH (ref 5–15)
BUN: 79 mg/dL — ABNORMAL HIGH (ref 8–23)
CO2: 27 mmol/L (ref 22–32)
Calcium: 6.2 mg/dL — CL (ref 8.9–10.3)
Chloride: 91 mmol/L — ABNORMAL LOW (ref 98–111)
Creatinine, Ser: 7.39 mg/dL — ABNORMAL HIGH (ref 0.61–1.24)
GFR, Estimated: 7 mL/min — ABNORMAL LOW (ref >60)
Glucose, Bld: 91 mg/dL (ref 70–99)
Potassium: 2.4 mmol/L — CL (ref 3.5–5.1)
Sodium: 138 mmol/L (ref 135–145)

## 2022-06-04 LAB — MAGNESIUM: Magnesium: 1.7 mg/dL (ref 1.7–2.4)

## 2022-06-04 LAB — CBC
HCT: 24.4 % — ABNORMAL LOW (ref 39.0–52.0)
Hemoglobin: 8.1 g/dL — ABNORMAL LOW (ref 13.0–17.0)
MCH: 29.8 pg (ref 26.0–34.0)
MCHC: 33.2 g/dL (ref 30.0–36.0)
MCV: 89.7 fL (ref 80.0–100.0)
Platelets: 139 10*3/uL — ABNORMAL LOW (ref 150–400)
RBC: 2.72 MIL/uL — ABNORMAL LOW (ref 4.22–5.81)
RDW: 15.3 % (ref 11.5–15.5)
WBC: 8.5 10*3/uL (ref 4.0–10.5)
nRBC: 0 % (ref 0.0–0.2)

## 2022-06-04 LAB — MRSA NEXT GEN BY PCR, NASAL: MRSA by PCR Next Gen: NOT DETECTED

## 2022-06-04 MED ORDER — POTASSIUM CHLORIDE CRYS ER 20 MEQ PO TBCR: 40.0000 meq | EXTENDED_RELEASE_TABLET | Freq: Four times a day (QID) | ORAL | Status: DC

## 2022-06-04 MED ORDER — CALCITRIOL 0.25 MCG PO CAPS
0.5000 ug | ORAL_CAPSULE | Freq: Every day | ORAL | Status: DC
  Administered 2022-06-04 – 2022-06-20 (×16): 0.5 ug via ORAL
  Filled 2022-06-04: qty 2
  Filled 2022-06-04: qty 1
  Filled 2022-06-04 (×12): qty 2
  Filled 2022-06-04 (×4): qty 1

## 2022-06-04 MED ORDER — POTASSIUM CHLORIDE CRYS ER 20 MEQ PO TBCR
40.0000 meq | EXTENDED_RELEASE_TABLET | Freq: Once | ORAL | Status: AC
  Administered 2022-06-04: 40 meq via ORAL
  Filled 2022-06-04: qty 2

## 2022-06-04 MED ORDER — POTASSIUM CHLORIDE 10 MEQ/100ML IV SOLN
10.0000 meq | INTRAVENOUS | Status: AC
  Administered 2022-06-04 (×2): 10 meq via INTRAVENOUS
  Filled 2022-06-04 (×2): qty 100

## 2022-06-04 MED ORDER — CALCIUM GLUCONATE-NACL 2-0.675 GM/100ML-% IV SOLN
2.0000 g | Freq: Once | INTRAVENOUS | Status: AC
  Administered 2022-06-04: 2000 mg via INTRAVENOUS
  Filled 2022-06-04: qty 100

## 2022-06-04 NOTE — Progress Notes (Addendum)
Contacted by nephrologist regarding pt's need for out-pt HD at d/c. Attempted to reach pt's son via phone. Message left requesting a return call. No family at bedside per pt's RN. Will await response from family to discuss needs further.   Olivia Canter Renal Navigator [phone]  Addendum at 11:09 am: Received a return call from pt's son. Family prefers pt receive out-pt HD at [facility] if possible. Will submit referral to [facility] today for review. Will assist as needed.

## 2022-06-04 NOTE — Progress Notes (Signed)
Received report from Primary Nurse Erin Sons, patient with HR of 133, sustained, B/P 129/85, K 2.4 AM labs. Notified On call MD Signe Colt. Ok to hold hemodialysis tonight.

## 2022-06-04 NOTE — Progress Notes (Signed)
OT Cancellation Note  Patient Details Name: Raffael Farragher MRN: [id number] DOB: 1940-11-24   Cancelled Treatment:    Reason Eval/Treat Not Completed: Medical issues which prohibited therapy (Spoke with Nursing who recommended that therapy wait to evaluate pt. Potassium was 2.4 this morning, he has been fatigued and lethargic, and HR has been elevated. Will follow up tomorrow and evaluate pt if able to participate.)  Limmie Patricia, OTR/L,CBIS  Supplemental OT - [facility] Secure Chat Preferred   06/04/2022, 1:22 PM

## 2022-06-04 NOTE — Progress Notes (Signed)
TRH night cross cover note:   I was notified by RN potassium level 2.4 this AM. Is scheduled for HD today. After reviewing yesterday's nephrology documentation, I ordered kcl 40 meq po x 1 dose now.      Newton Pigg, DO Hospitalist

## 2022-06-04 NOTE — Progress Notes (Signed)
PROGRESS NOTE    Glenn Olson  WUJ:[id number] DOB: [birth year] DOA: 05/30/2022 PCP: Benita Stabile, MD    Brief Narrative:   Glenn Olson is a 82 y.o. male with medical history significant for ESRD, neurogenic atonic bladder presented to hospital with headache and chest pain for 1 week.  Patient was on hemodialysis but had stopped going for hemodialysis for 1 year now.  Has been having poor oral intake with nausea and vomiting.  In the ED, patient was mildly tachypneic and tachycardic with elevated blood pressure.  Chest x-ray with increased left lower lower lobe markings. CO2 less than 7.  BUN of 241.  Potassium 4.5.  Creatinine 13.8.Marland KitchenTroponin 48 > 52.  Nephrology was consulted and decision was to proceed with hemodialysis.  Patient was then transferred to [facility].  At this time, patient has received vascular catheter placed in by vascular surgery has been started on hemodialysis.  Currently getting hemodialysis and awaiting for improvement in mental status and outpatient hemodialysis set up.   Assessment and Plan:  * Uremia secondary to end-stage renal disease Had not been on dialysis for 1 year.   Vascular surgery has placed in tunneled hemodialysis catheter for hemodialysis and patient has been resumed on hemodialysis.   Further plans as per nephrology.  Hypokalemia.   Significant hypokalemia with potassium of 2.4.  Continue oral potassium.  Patient will get hemodialysis today.  Hypocalcemia.  Received calcium gluconate again today.  Will continue to monitor closely.  Hypomagnesemia.  Magnesium of 1.7, had received magnesium supplements.   Chest pain   Nonspecific and generalized chest pain.   Resolved  Atonic neurogenic bladder Continue suprapubic catheter  Anemia of chronic kidney disease.   Hemoglobin of 8.1 from initial 6.7  after 1 unit of packed RBC transfusion.  No evidence of gross bleeding or blood loss at this time.  Iron profile shows iron of 31.  Continue  iron transfusion.  Deconditioning, debility.  Physical therapy evaluation recommend skilled nursing facility.   DVT prophylaxis: SCDs Start: 05/30/22 1926   Code Status:     Code Status: DNR  Disposition:  Likely need skilled nursing facility placement..    Status is: Inpatient  Remains inpatient appropriate because: Uremia need for hemodialysis, goals of care discussion.   Family Communication:  Spoke with multiple family members including son and daughter at bedside.  Consultants:  Nephrology Vascular surgery Palliative care  Procedures:  Right internal jugular tunneled hemodialysis catheter placement Hemodialysis  Antimicrobials:  None  Anti-infectives (From admission, onward)    Start     Dose/Rate Route Frequency Ordered Stop   05/31/22 1115  ceFAZolin (ANCEF) IVPB 2g/100 mL premix        2 g 200 mL/hr over 30 Minutes Intravenous  Once 05/31/22 1108 05/31/22 1200   05/31/22 1112  ceFAZolin (ANCEF) 2-4 GM/100ML-% IVPB       Note to Pharmacy: Shanda Bumps M: cabinet override      05/31/22 1112 05/31/22 1206      Subjective: Today, patient was seen and examined at bedside.  Appears to be sleepy.  Denies any nausea vomiting or pain.  Complains of decreased appetite.    Objective: Vitals:   06/03/22 1547 06/03/22 2007 06/04/22 0555 06/04/22 0758  BP: (!) 143/76 133/73 129/79 125/84  Pulse: 87 77 (!) 129 (!) 129  Resp: 18 18 18    Temp: 98.5 F (36.9 C) 97.9 F (36.6 C) 97.9 F (36.6 C)   TempSrc:  Oral Oral  SpO2: 95% 92% 95% 98%  Weight:      Height:        Intake/Output Summary (Last 24 hours) at 06/04/2022 1019 Last data filed at 06/03/2022 1800 Gross per 24 hour  Intake 150 ml  Output 275 ml  Net -125 ml    Filed Weights   05/30/22 1436 06/02/22 0818 06/02/22 1057  Weight: 80.3 kg 68.8 kg 69.4 kg    Physical Examination: Body mass index is 22.59 kg/m.   General:  Average built, not in obvious distress, elderly male, mildly sleepy but  Communicative, HENT:   No scleral pallor or icterus noted. Oral mucosa is dry, Chest:   Diminished breath sounds bilaterally. No crackles or wheezes.  CVS: S1 &S2 heard. No murmur.  Regular rate and rhythm. Abdomen: Soft, nontender, nondistended.  Bowel sounds are heard.   Extremities: No cyanosis, clubbing or edema.  Peripheral pulses are palpable. Psych: Alert, awake and Communicative, mildly sleepy CNS:  No cranial nerve deficits.  Generalized weakness, moves all extremities Skin: Warm and dry.  No rashes noted. dry skin.   Data Reviewed:   CBC: Recent Labs  Lab 05/30/22 1445 05/31/22 0723 05/31/22 1117 06/01/22 0759 06/02/22 0411 06/03/22 0248 06/04/22 0414  WBC 9.3 6.6  --  6.5 7.6 6.7 8.5  NEUTROABS 8.3*  --   --   --   --   --   --   HGB 8.6* 7.3* 7.5* 6.7* 7.6* 7.1* 8.1*  HCT 25.5* 21.7* 22.0* 20.0* 21.5* 21.1* 24.4*  MCV 89.5 87.1  --  87.3 84.3 87.6 89.7  PLT 226 167  --  149* 136* 128* 139*     Basic Metabolic Panel: Recent Labs  Lab 05/31/22 1138 06/01/22 0759 06/02/22 0411 06/03/22 0248 06/04/22 0414  NA 139 140 137 136 138  K 4.0 3.3* 3.1* 2.4* 2.4*  CL 102 100 94* 94* 91*  CO2 7* 12* 17* 23 27  GLUCOSE 77 77 77 68* 91  BUN 257* 206* 143* 77* 79*  CREATININE 13.97* 12.40* 9.38* 6.38* 7.39*  CALCIUM 5.9* 5.5* 5.6* 5.9* 6.2*  MG  --   --  1.3* 1.8 1.7  PHOS  --  9.3* 7.4*  --   --      Liver Function Tests: Recent Labs  Lab 05/30/22 1445 06/01/22 0759 06/02/22 0411  AST 12*  --   --   ALT 16  --   --   ALKPHOS 103  --   --   BILITOT 0.7  --   --   PROT 7.9  --   --   ALBUMIN 4.1 3.4* 3.2*      Radiology Studies: No results found.    LOS: 5 days    Joycelyn Das, MD [facility] Available via Epic secure chat 7am-7pm After these hours, please refer to coverage provider listed on [email or web address] 06/04/2022, 10:19 AM

## 2022-06-04 NOTE — Progress Notes (Signed)
Inpatient Rehab Admissions Coordinator:   Following discussion with PT, patient was screened for CIR candidacy by Megan Salon, MS, CCC-SLP. At this time, Pt. Appears to be a a potential candidate for CIR. I will place  order for rehab consult per protocol for full assessment. Please contact me any with questions.  Megan Salon, MS, CCC-SLP Rehab Admissions Coordinator  [phone] (celll) [phone] (office)

## 2022-06-04 NOTE — TOC Initial Note (Signed)
Transition of Care [facility]) - Initial/Assessment Note    Patient Details  Name: Glenn Olson MRN: [id number] Date of Birth: [birth year]  Transition of Care [facility]) CM/SW Contact:    Ryott Rafferty A [place], Theresia Majors Phone Number: 06/04/2022, 1:40 PM  Clinical Narrative:                 TOC met with pt at beside. Pt agreeable to SNF placement in [place], [facility] SNF workup to be completed.   TOC will continue to follow.   Expected Discharge Plan: Skilled Nursing Facility Barriers to Discharge: SNF Pending bed offer   Patient Goals and CMS Choice            Expected Discharge Plan and Services In-house Referral: Clinical Social Work     Living arrangements for the past 2 months: Single Family Home                                      Prior Living Arrangements/Services Living arrangements for the past 2 months: Single Family Home Lives with:: Self          Need for Family Participation in Patient Care: No (Comment) Care giver support system in place?: No (comment)   Criminal Activity/Legal Involvement Pertinent to Current Situation/Hospitalization: No - Comment as needed  Activities of Daily Living   ADL Screening (condition at time of admission) Is the patient deaf or have difficulty hearing?: Yes Does the patient have difficulty seeing, even when wearing glasses/contacts?: No Does the patient have difficulty concentrating, remembering, or making decisions?: Yes Does the patient have difficulty dressing or bathing?: No Does the patient have difficulty walking or climbing stairs?: Yes  Permission Sought/Granted                  Emotional Assessment Appearance:: Appears stated age Attitude/Demeanor/Rapport: Lethargic Affect (typically observed): Quiet Orientation: : Oriented to Self, Oriented to Place Alcohol / Substance Use: Not Applicable Psych Involvement: No (comment)  Admission diagnosis:  Tachypnea [R06.82] Uremia [N19] Tachycardia  [R00.0] ESRD needing dialysis (HCC) [N18.6, Z99.2] 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 [place] [address] Phone: [phone] Fax: [phone]  [facility] - [place], [place] - [address] [place] [address] Phone: [phone] Fax: [phone]     Social Determinants of Health (SDOH) Social History: SDOH Screenings   Depression (PHQ2-9): Low Risk  (09/07/2020)  Tobacco Use: Low Risk  (06/01/2022)   SDOH Interventions:     Readmission Risk Interventions    12/02/2020    9:52 AM  Readmission Risk Prevention Plan  Medication Screening Complete  Transportation Screening Complete

## 2022-06-04 NOTE — Progress Notes (Signed)
Admit: 05/30/2022 LOS: 5  Glenn Olson is an 82 year old male who presented with symptoms of fatigue, headache, chest pain in setting of quitting HD quit about 14 months ago because he did not like going and stopped all of his medications. HD catheter was placed by [facility] 5/2 and patient has resumed HD.   Subjective:  Patient lying in bed, appears very sleepy this morning, not opening eyes but able to answer questions with one-word answers.  Admits to feeling tired, states yes when asked if he would like to continue HD.   05/05 0701 - 05/06 0700 In: 150 [P.O.:150] Out: 275 [Urine:275]  Filed Weights   05/30/22 1436 06/02/22 0818 06/02/22 1057  Weight: 80.3 kg 68.8 kg 69.4 kg    Scheduled Meds:  sodium chloride   Intravenous Once   calcitRIOL  0.5 mcg Oral Daily   calcium acetate  1,334 mg Oral TID WC   Chlorhexidine Gluconate Cloth  6 each Topical Q0600   darbepoetin (ARANESP) injection - DIALYSIS  40 mcg Subcutaneous Q Fri-1800   loratadine  10 mg Oral Daily   Continuous Infusions:  calcium gluconate 2,000 mg (06/04/22 0744)   ferric gluconate (FERRLECIT) IVPB     potassium chloride 10 mEq (06/04/22 0745)   PRN Meds:.acetaminophen **OR** acetaminophen, HYDROmorphone (DILAUDID) injection, labetalol, ondansetron (ZOFRAN) IV, polyethylene glycol  Current Labs: reviewed    Physical Exam:  Blood pressure 125/84, pulse (!) 129, temperature 97.9 F (36.6 C), temperature source Oral, resp. rate 18, height 5\' 9"  (1.753 m), weight 69.4 kg, SpO2 98 %. General: Frail 82 year old male, lying in bed resting, NAD Cardio: RRR, normal S1/S2 Lungs: CTAB, normal effort Abdomen: Soft, nontender palpation, nondistended Extremities: No edema BLEs Neuro: Somnolent, able to answer questions with one-word appropriately   A ESRD and uremia: HD catheter placed 5/2, HD session on 5/2 cut short to 30 minutes due to patient agitation.  HD 06/01/22 cut short 15 min due to machine error and tolerated HD  well on 06/02/2022-no sitter needed. K 2.4 this morning and repleted w/ 40 mEq. Cr down trending, 7.39 today from 15.5 on 5/2.  Uremia continues to downtrend, 79 today from 257 on 5/2. Anemia of CKD V: Hgb 7.5>6.7>7.1>8.1 today. S/p I Unit RBCs on 5/3. 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 Phosphorous 9.3>7.4 on  5/4 and binders started. Ca 5.9>6.2 today and starting calcitriol Neurogenic bladder Hypokalemia -K 3.1 today Hypomagnesemia - 1.7 today Metabolic acidosis due to uremia, bicarb 27 today    P ESRD and uremia: Will plan for HD today and will need outpatient arrangements at [facility] when stable for discharge. Vascular access - he has a functioning AVG in his left forearm, as well as a RIJ TDC.  Given his confusion, would not be safe to use AVG initially.  He does not want to use AVG at this time.  Will continue with [facility] for now.  Will address this in the future if he tolerates HD. Anemia of CKD V:. CTM hgb. Started Aranesp 40 mcg weekly.  Transfuse prn with transfusion threshold hgb <7.  IV iron w/ HD CKD-BMD -s/p IV calcium, started on PhosLo and calcitriol.  Will follow phosphorus and calcium levels Neurogenic bladder-suprapubic catheter in place Hypokalemia-K repleted this morning, K bath with HD today Hypomagnesemia-magnesium repleted 5/4 will CTM Metabolic acidosis, bicarb improved-isotonic bicarbonate drip stopped   Medication Issues; Preferred narcotic agents for pain control are hydromorphone, fentanyl, and methadone. Morphine should  not be used.  Baclofen should be avoided Avoid oral sodium phosphate and magnesium citrate based laxatives / bowel preps    Erick Alley, DO   Medicine, PGY-2  Recent Labs  Lab 06/01/22 [phone] 06/02/22 0411 06/03/22 0248 06/04/22 0414  NA 140 137 136 138  K 3.3* 3.1* 2.4* 2.4*  CL 100 94* 94* 91*  CO2 12* 17* 23 27  GLUCOSE 77 77 68* 91  BUN 206* 143* 77* 79*  CREATININE 12.40* 9.38* 6.38* 7.39*   CALCIUM 5.5* 5.6* 5.9* 6.2*  PHOS 9.3* 7.4*  --   --    Recent Labs  Lab 05/30/22 1445 05/31/22 0723 06/02/22 0411 06/03/22 0248 06/04/22 0414  WBC 9.3   < > 7.6 6.7 8.5  NEUTROABS 8.3*  --   --   --   --   HGB 8.6*   < > 7.6* 7.1* 8.1*  HCT 25.5*   < > 21.5* 21.1* 24.4*  MCV 89.5   < > 84.3 87.6 89.7  PLT 226   < > 136* 128* 139*   < > = values in this interval not displayed.

## 2022-06-05 ENCOUNTER — Inpatient Hospital Stay (HOSPITAL_COMMUNITY): Payer: Medicare Other

## 2022-06-05 DIAGNOSIS — D649 Anemia, unspecified: Secondary | ICD-10-CM | POA: Diagnosis not present

## 2022-06-05 DIAGNOSIS — I4891 Unspecified atrial fibrillation: Secondary | ICD-10-CM | POA: Diagnosis not present

## 2022-06-05 DIAGNOSIS — N186 End stage renal disease: Secondary | ICD-10-CM | POA: Diagnosis not present

## 2022-06-05 DIAGNOSIS — I4819 Other persistent atrial fibrillation: Secondary | ICD-10-CM | POA: Diagnosis not present

## 2022-06-05 DIAGNOSIS — Z66 Do not resuscitate: Secondary | ICD-10-CM

## 2022-06-05 DIAGNOSIS — R0682 Tachypnea, not elsewhere classified: Secondary | ICD-10-CM

## 2022-06-05 DIAGNOSIS — Z515 Encounter for palliative care: Secondary | ICD-10-CM

## 2022-06-05 DIAGNOSIS — N189 Chronic kidney disease, unspecified: Secondary | ICD-10-CM

## 2022-06-05 DIAGNOSIS — Z7189 Other specified counseling: Secondary | ICD-10-CM | POA: Diagnosis not present

## 2022-06-05 DIAGNOSIS — R000 Tachycardia, unspecified: Secondary | ICD-10-CM

## 2022-06-05 DIAGNOSIS — E876 Hypokalemia: Secondary | ICD-10-CM | POA: Diagnosis present

## 2022-06-05 LAB — ECHOCARDIOGRAM COMPLETE
Area-P 1/2: 10.68 cm2
Height: 69 in
S' Lateral: 3.3 cm
Weight: 2447.99 oz

## 2022-06-05 LAB — RENAL FUNCTION PANEL
Albumin: 2.7 g/dL — ABNORMAL LOW (ref 3.5–5.0)
Anion gap: 19 — ABNORMAL HIGH (ref 5–15)
BUN: 81 mg/dL — ABNORMAL HIGH (ref 8–23)
CO2: 27 mmol/L (ref 22–32)
Calcium: 6.6 mg/dL — ABNORMAL LOW (ref 8.9–10.3)
Chloride: 94 mmol/L — ABNORMAL LOW (ref 98–111)
Creatinine, Ser: 8.21 mg/dL — ABNORMAL HIGH (ref 0.61–1.24)
GFR, Estimated: 6 mL/min — ABNORMAL LOW (ref >60)
Glucose, Bld: 96 mg/dL (ref 70–99)
Phosphorus: 5.8 mg/dL — ABNORMAL HIGH (ref 2.5–4.6)
Potassium: 3.1 mmol/L — ABNORMAL LOW (ref 3.5–5.1)
Sodium: 140 mmol/L (ref 135–145)

## 2022-06-05 LAB — CBC
HCT: 23.5 % — ABNORMAL LOW (ref 39.0–52.0)
Hemoglobin: 7.9 g/dL — ABNORMAL LOW (ref 13.0–17.0)
MCH: 30.3 pg (ref 26.0–34.0)
MCHC: 33.6 g/dL (ref 30.0–36.0)
MCV: 90 fL (ref 80.0–100.0)
Platelets: 145 10*3/uL — ABNORMAL LOW (ref 150–400)
RBC: 2.61 MIL/uL — ABNORMAL LOW (ref 4.22–5.81)
RDW: 15 % (ref 11.5–15.5)
WBC: 8.6 10*3/uL (ref 4.0–10.5)
nRBC: 0 % (ref 0.0–0.2)

## 2022-06-05 LAB — TSH: TSH: 1.494 u[IU]/mL (ref 0.350–4.500)

## 2022-06-05 LAB — PARATHYROID HORMONE, INTACT (NO CA)
PTH: 495 pg/mL — ABNORMAL HIGH (ref 15–65)
PTH: 386 pg/mL — ABNORMAL HIGH (ref 15–65)

## 2022-06-05 LAB — MAGNESIUM: Magnesium: 1.6 mg/dL — ABNORMAL LOW (ref 1.7–2.4)

## 2022-06-05 MED ORDER — CALCIUM GLUCONATE-NACL 1-0.675 GM/50ML-% IV SOLN
1.0000 g | Freq: Once | INTRAVENOUS | Status: AC
  Administered 2022-06-05: 1000 mg via INTRAVENOUS
  Filled 2022-06-05: qty 50

## 2022-06-05 MED ORDER — POTASSIUM CHLORIDE CRYS ER 20 MEQ PO TBCR
40.0000 meq | EXTENDED_RELEASE_TABLET | Freq: Four times a day (QID) | ORAL | Status: AC
  Administered 2022-06-05 (×2): 40 meq via ORAL
  Filled 2022-06-05 (×3): qty 2

## 2022-06-05 MED ORDER — MAGNESIUM SULFATE IN D5W 1-5 GM/100ML-% IV SOLN
1.0000 g | Freq: Once | INTRAVENOUS | Status: AC
  Administered 2022-06-05: 1 g via INTRAVENOUS
  Filled 2022-06-05: qty 100

## 2022-06-05 MED ORDER — POTASSIUM CHLORIDE CRYS ER 20 MEQ PO TBCR
20.0000 meq | EXTENDED_RELEASE_TABLET | Freq: Once | ORAL | Status: AC
  Administered 2022-06-05: 20 meq via ORAL
  Filled 2022-06-05: qty 1

## 2022-06-05 MED ORDER — HEPARIN (PORCINE) 25000 UT/250ML-% IV SOLN
2400.0000 [IU]/h | INTRAVENOUS | Status: DC
  Administered 2022-06-05: 950 [IU]/h via INTRAVENOUS
  Administered 2022-06-06: 1250 [IU]/h via INTRAVENOUS
  Administered 2022-06-07: 1450 [IU]/h via INTRAVENOUS
  Administered 2022-06-07: 1900 [IU]/h via INTRAVENOUS
  Administered 2022-06-08: 2200 [IU]/h via INTRAVENOUS
  Administered 2022-06-08 – 2022-06-09 (×2): 2400 [IU]/h via INTRAVENOUS
  Filled 2022-06-05 (×8): qty 250

## 2022-06-05 MED ORDER — DILTIAZEM HCL 30 MG PO TABS
30.0000 mg | ORAL_TABLET | Freq: Four times a day (QID) | ORAL | Status: AC
  Administered 2022-06-05 – 2022-06-06 (×5): 30 mg via ORAL
  Filled 2022-06-05 (×6): qty 1

## 2022-06-05 MED ORDER — DILTIAZEM HCL 25 MG/5ML IV SOLN
10.0000 mg | Freq: Once | INTRAVENOUS | Status: AC
  Administered 2022-06-05: 10 mg via INTRAVENOUS
  Filled 2022-06-05: qty 5

## 2022-06-05 NOTE — Progress Notes (Signed)
Physical Therapy Treatment Patient Details Name: Glenn Olson MRN: [id number] DOB: [birth year] Today's Date: 06/05/2022   History of Present Illness Glenn Olson is a 82 y.o. male admitted 5/1  presented to hospital with headache and chest pain for 1 week.  Patient was on hemodialysis but had stopped going for hemodialysis for 1 year now.  Has been having poor oral intake with nausea and vomiting.  In the ED, patient was mildly tachypneic and tachycardic with elevated blood pressure as well as hypokalemia.  Chest x-ray with increased left lower lower lobe markings.  HD reinitiated.  PMH: ESRD, neurogenic atonic bladder    PT Comments    Pt admitted with above diagnosis. Pt was able to ambulate with RW however did limit distance due to report of dizziness.  BP was 132/79 with HR 139-155 bpm with activity.  Pt did LE exercise EOB but didn't want to take a second walk.  Assisted pt in warming his breakfast. Will continue to follow acutely.  Pt currently with functional limitations due to balance and endurance deficits. Pt will benefit from acute skilled PT to increase their independence and safety with mobility to allow discharge.      Recommendations for follow up therapy are one component of a multi-disciplinary discharge planning process, led by the attending physician.  Recommendations may be updated based on patient status, additional functional criteria and insurance authorization.  Follow Up Recommendations  Can patient physically be transported by private vehicle: No    Assistance Recommended at Discharge Frequent or constant Supervision/Assistance  Patient can return home with the following A lot of help with walking and/or transfers;A lot of help with bathing/dressing/bathroom;Assistance with cooking/housework;Assist for transportation;Help with stairs or ramp for entrance   Equipment Recommendations  Other (comment) (TBA)    Recommendations for Other Services       Precautions /  Restrictions Precautions Precautions: Fall Restrictions Weight Bearing Restrictions: No     Mobility  Bed Mobility Overal bed mobility: Needs Assistance Bed Mobility: Supine to Sit     Supine to sit: Min guard     General bed mobility comments: Needed incr time as pt slow to move to EOB but didnt need any help    Transfers Overall transfer level: Needs assistance Equipment used: Rolling walker (2 wheels) Transfers: Sit to/from Stand Sit to Stand: Min assist           General transfer comment: No assist to stand but pt slow to stand and needed cues for hand placement    Ambulation/Gait Ambulation/Gait assistance: Min assist Gait Distance (Feet): 40 Feet Assistive device: Rolling walker (2 wheels) Gait Pattern/deviations: Step-through pattern, Decreased stride length, Trunk flexed, Drifts right/left, Leaning posteriorly   Gait velocity interpretation: <1.31 ft/sec, indicative of household ambulator   General Gait Details: Pt needed min assist and min cues for proximity to RW as he tends to push it too far forward at times. Pt less impulsive but continues to need assist for safety. Pt fatigued quickly and wanted to turn around and return to bed due to fatigue and report of dizziness.  HR 139-155 bpm with activity and BP once back on bed 132/79 therefore didnt seem to have orthostasis.   Stairs             Wheelchair Mobility    Modified Rankin (Stroke Patients Only)       Balance Overall balance assessment: Needs assistance Sitting-balance support: No upper extremity supported, Feet supported Sitting balance-Leahy Scale: Fair  Standing balance support: Bilateral upper extremity supported, During functional activity, Reliant on assistive device for balance Standing balance-Leahy Scale: Poor Standing balance comment: relies on UE support on RW                            Cognition Arousal/Alertness: Awake/alert Behavior During Therapy:  Flat affect, Restless, Impulsive Overall Cognitive Status: Impaired/Different from baseline Area of Impairment: Following commands, Safety/judgement, Problem solving, Awareness, Attention                   Current Attention Level: Focused   Following Commands: Follows one step commands with increased time Safety/Judgement: Decreased awareness of safety, Decreased awareness of deficits   Problem Solving: Difficulty sequencing, Requires verbal cues, Requires tactile cues General Comments: Pt with less impulsivity today and better safety awareness.        Exercises General Exercises - Lower Extremity Long Arc Quad: AROM, Both, 10 reps, Seated    General Comments General comments (skin integrity, edema, etc.): Warmed pts breakfast and got him coffee at end of session.      Pertinent Vitals/Pain Pain Assessment Pain Assessment: No/denies pain    Home Living                          Prior Function            PT Goals (current goals can now be found in the care plan section) Progress towards PT goals: Progressing toward goals    Frequency    Min 2X/week      PT Plan Discharge plan needs to be updated    Co-evaluation              AM-PAC PT "6 Clicks" Mobility   Outcome Measure  Help needed turning from your back to your side while in a flat bed without using bedrails?: A Little Help needed moving from lying on your back to sitting on the side of a flat bed without using bedrails?: A Little Help needed moving to and from a bed to a chair (including a wheelchair)?: A Little Help needed standing up from a chair using your arms (e.g., wheelchair or bedside chair)?: A Little Help needed to walk in hospital room?: A Little Help needed climbing 3-5 steps with a railing? : A Lot 6 Click Score: 17    End of Session Equipment Utilized During Treatment: Gait belt Activity Tolerance: Patient limited by fatigue Patient left: in bed;with call  bell/phone within reach;with bed alarm set Nurse Communication: Mobility status PT Visit Diagnosis: Unsteadiness on feet (R26.81);Muscle weakness (generalized) (M62.81)     Time: 1610-9604 PT Time Calculation (min) (ACUTE ONLY): 27 min  Charges:  $Gait Training: 8-22 mins $Therapeutic Exercise: 8-22 mins                     [facility] M,PT Acute Rehab Services [phone]    Bevelyn Buckles 06/05/2022, 11:42 AM

## 2022-06-05 NOTE — Consult Note (Signed)
Admit: 05/30/2022 LOS: 6  Glenn Olson is an 82 year old male who presented with symptoms of fatigue, headache, chest pain in setting of quitting HD quit about 14 months ago because he did not like going and stopped all of his medications. HD catheter was placed by [facility] 5/2 and patient has resumed HD.   Subjective:  Patient is feeling tired today, denies being in any pain.  When asked if he would like to continue HD if appropriate he says yes.  05/06 0701 - 05/07 0700 In: 390.5 [P.O.:200; IV Piggyback:190.5] Out: 1500 [Urine:1500]  Filed Weights   05/30/22 1436 06/02/22 0818 06/02/22 1057  Weight: 80.3 kg 68.8 kg 69.4 kg    Scheduled Meds:  sodium chloride   Intravenous Once   calcitRIOL  0.5 mcg Oral Daily   calcium acetate  1,334 mg Oral TID WC   Chlorhexidine Gluconate Cloth  6 each Topical Q0600   darbepoetin (ARANESP) injection - DIALYSIS  40 mcg Subcutaneous Q Fri-1800   loratadine  10 mg Oral Daily   potassium chloride  40 mEq Oral Q6H   Continuous Infusions:  calcium gluconate     ferric gluconate (FERRLECIT) IVPB Stopped (06/04/22 1513)   magnesium sulfate bolus IVPB     PRN Meds:.acetaminophen **OR** acetaminophen, HYDROmorphone (DILAUDID) injection, labetalol, ondansetron (ZOFRAN) IV, polyethylene glycol  Current Labs: reviewed    Physical Exam:  Blood pressure (!) 148/90, pulse (!) 123, temperature 97.6 F (36.4 C), temperature source Oral, resp. rate 16, height 5\' 9"  (1.753 m), weight 69.4 kg, SpO2 97 %. General: Frail 82 year old male, NAD Cardio: Tachycardic, regular rhythm Lungs: CTAB Abdomen: Soft, nontender to palpation, nondistended Extremities: No edema of BLEs  A ESRD and uremia: HD catheter placed 5/2, HD session on 5/2 cut short to 30 minutes due to patient agitation.  HD 06/01/22 cut short 15 min due to machine error and tolerated HD well on 06/02/2022-no sitter needed. Did not have HD last night d/t HR in 130's and hypokalemia. Uremia stable with BUN  81 today.  Tachycardia- HR up to 130's since yesterday  Anemia of CKD V: Hgb 7.5>6.7>7.1>8.1>7.9 today. S/p I Unit RBCs on 5/3. 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 Phosphorous 9.3>7.4 >5.8 today with phoslo. Ca 5.9>6.2>6.6 today and calcitriol started 5/6.  Neurogenic bladder Hypokalemia - K 3.1 today Hypomagnesemia - 1.6 today Metabolic acidosis resolved, bicarb 27 today  P ESRD and uremia: Will plan for HD today and will need outpatient arrangements at [facility] when stable for discharge. Vascular access - he has a functioning AVG in his left forearm, as well as a RIJ TDC.  Given his confusion, would not be safe to use AVG initially.  He does not want to use AVG at this time.  [facility] with [facility] for now.  Will address this in the future if he tolerates HD. Will hold HD today as labs are stable with plans to resume tomorrow even if tachycardic.  Tachycardia - primary team to manage  Anemia of CKD V:. CTM hgb. Started Aranesp 40 mcg weekly.  Transfuse prn with transfusion threshold hgb <7.  IV iron w/ HD CKD-BMD -s/p IV calcium, started on PhosLo and calcitriol.  Will follow phosphorus and calcium levels Neurogenic bladder-suprapubic catheter in place Hypokalemia-K repleted this morning Hypomagnesemia-magnesium repleted today, will CTM Metabolic acidosis, bicarb improved Medication Issues; Preferred narcotic agents for pain control are hydromorphone, fentanyl, and methadone. Morphine should not be used.  Baclofen should  be avoided Avoid oral sodium phosphate and magnesium citrate based laxatives / bowel preps    Erick Alley, DO Family Medicine, PGY-2  Recent Labs  Lab 06/01/22 [phone] 06/02/22 0411 06/03/22 0248 06/04/22 0414 06/05/22 0251  NA 140 137 136 138 140  K 3.3* 3.1* 2.4* 2.4* 3.1*  CL 100 94* 94* 91* 94*  CO2 12* 17* 23 27 27   GLUCOSE 77 77 68* 91 96  BUN 206* 143* 77* 79* 81*  CREATININE 12.40* 9.38* 6.38* 7.39* 8.21*  CALCIUM  5.5* 5.6* 5.9* 6.2* 6.6*  PHOS 9.3* 7.4*  --   --  5.8*   Recent Labs  Lab 05/30/22 1445 05/31/22 0723 06/03/22 0248 06/04/22 0414 06/05/22 0247  WBC 9.3   < > 6.7 8.5 8.6  NEUTROABS 8.3*  --   --   --   --   HGB 8.6*   < > 7.1* 8.1* 7.9*  HCT 25.5*   < > 21.1* 24.4* 23.5*  MCV 89.5   < > 87.6 89.7 90.0  PLT 226   < > 128* 139* 145*   < > = values in this interval not displayed.

## 2022-06-05 NOTE — Progress Notes (Signed)
Daily Progress Note   Patient Name: Glenn Olson       Date: 06/05/2022 DOB: 1941/01/03  Age: 82 y.o. MRN#: [id number] Attending Physician: Joycelyn Das, MD Primary Care Physician: Benita Stabile, MD Admit Date: 05/30/2022 Length of Stay: 6 days  Reason for Consultation/Follow-up: Establishing goals of care  HPI/Patient Profile:  81 y.o. male  with past medical history of ESRD, neurogenic atonic bladder he presented to hospital with headache and chest pain for 1 week. Patient was on hemodialysis but had stopped going for hemodialysis for 1 year now. Has been having poor oral intake with nausea and vomiting.  He was admitted on 05/30/2022 with uremia secondary to end-stage renal disease, hyperkalemia, chest pain, anemia of chronic disease, deconditioning and debility, and others.    PMT was consulted for GOC conversations.  Subjective:   Subjective: Chart Reviewed. Updates received. Patient Assessed. Created space and opportunity for patient  and family to explore thoughts and feelings regarding current medical situation.  Today's Discussion: Today I saw the patient at the bedside, no family was present.  He opens his eyes and interacts.  He does describe some fatigue.  However, he denies pain, nausea, vomiting.  His breakfast tray is at the bedside and it appears he ate 50% of breakfast.  We talked about hemodialysis, possible plans for dialysis today.  He states he is okay to continue hemodialysis.  Family has not been to visit today but anticipates them coming later.  I asked if there is any I can do for him today and he said there is not.  I provided emotional and general support through therapeutic listening, empathy, sharing of stories, therapeutic touch, and other techniques. I answered all questions and addressed all concerns to the best of my ability.  Review of Systems  Constitutional:  Positive for fatigue.       Denies pain in general  Respiratory:  Negative for cough and shortness  of breath.   Cardiovascular:  Negative for chest pain.  Gastrointestinal:  Negative for abdominal pain, nausea and vomiting.    Objective:   Vital Signs:  BP (!) 148/90 (BP Location: Left Arm)   Pulse (!) 115   Temp 97.6 F (36.4 C) (Oral)   Resp 16   Ht 5\' 9"  (1.753 m)   Wt 69.4 kg   SpO2 97%   BMI 22.59 kg/m   Physical Exam: Physical Exam Vitals and nursing note reviewed.  Constitutional:      General: He is sleeping. He is not in acute distress. HENT:     Head: Normocephalic and atraumatic.  Cardiovascular:     Rate and Rhythm: Normal rate.  Pulmonary:     Effort: Pulmonary effort is normal. No respiratory distress.  Abdominal:     General: Abdomen is flat.     Palpations: Abdomen is soft.  Skin:    General: Skin is warm and dry.  Neurological:     General: No focal deficit present.     Mental Status: He is easily aroused.  Psychiatric:        Mood and Affect: Mood normal.        Behavior: Behavior normal.     Palliative Assessment/Data: 40-50%    Existing Vynca/ACP Documentation: None  Assessment & Plan:   Impression: Present on Admission:  Uremia  Atonic neurogenic bladder  Metabolic acidosis  Chest pain  SUMMARY OF RECOMMENDATIONS   Remain DNR Continue hemodialysis as long as patient tolerates and is agreeable Anticipate discharge to  home with continued outpatient hemodialysis Goals at this time are clear PMT will back off for now Please let us know of any significant clinical change or new palliative needs.  Symptom Management:  Per primary team PMT is available to assist as needed  Code Status: DNR  Prognosis: Unable to determine  Discharge Planning: To Be Determined  Discussed with: Patient, medical team, nursing team  Thank you for allowing [place] to participate in the care of Glenn Olson PMT will continue to support holistically.  Billing based on MDM: Moderate   Wynne Dust, NP Palliative Medicine Team  Team Phone #  [phone] (Nights/Weekends)  09/27/2020, 8:17 AM

## 2022-06-05 NOTE — Evaluation (Signed)
Occupational Therapy Evaluation Patient Details Name: Glenn Olson MRN: [id number] DOB: 1940/10/02 Today's Date: 06/05/2022   History of Present Illness Glenn Olson is a 82 y.o. male admitted 5/1  presented to hospital with headache and chest pain for 1 week.  Patient was on hemodialysis but had stopped going for hemodialysis for 1 year now.  Has been having poor oral intake with nausea and vomiting.  In the ED, patient was mildly tachypneic and tachycardic with elevated blood pressure as well as hypokalemia.  Chest x-ray with increased left lower lower lobe markings.  HD reinitiated.  PMH: ESRD, neurogenic atonic bladder   Clinical Impression   Hendrik was evaluated s/p the above admission list. Per chart review, pt is mod I at baseline. Upon evaluation he was limited by generalized weakness, decreased activity tolerance, impaired cognition, elevated HR with dizziness, and cardiopulmonary endurance. Overall he required min A for bed mobility and mod A +2 to stand EOB x2. Upon standing pt's HR elevated to 177 with report of dizziness, tele alarming afib. After sitting and short rest break, HR recovered to 130s. Due to the deficits listed below he also requires up to max A +2 for LB ADLs and mod A for UB ADLs. Pt will benefit from continued acute OT service and skilled inpatient follow up therapy, <3 hours/day.        Recommendations for follow up therapy are one component of a multi-disciplinary discharge planning process, led by the attending physician.  Recommendations may be updated based on patient status, additional functional criteria and insurance authorization.   Assistance Recommended at Discharge Frequent or constant Supervision/Assistance  Patient can return home with the following A lot of help with walking and/or transfers;A lot of help with bathing/dressing/bathroom;Assistance with cooking/housework;Assist for transportation;Help with stairs or ramp for entrance    Functional  Status Assessment  Patient has had a recent decline in their functional status and demonstrates the ability to make significant improvements in function in a reasonable and predictable amount of time.  Equipment Recommendations  Other (comment) (defer)       Precautions / Restrictions Precautions Precautions: Fall Precaution Comments: watch HR Restrictions Weight Bearing Restrictions: No      Mobility Bed Mobility Overal bed mobility: Needs Assistance Bed Mobility: Supine to Sit, Sit to Supine     Supine to sit: Min assist Sit to supine: Min assist        Transfers Overall transfer level: Needs assistance Equipment used: Rolling walker (2 wheels) Transfers: Sit to/from Stand Sit to Stand: Mod assist, +2 physical assistance, +2 safety/equipment           General transfer comment: x2      Balance Overall balance assessment: Needs assistance Sitting-balance support: Feet supported Sitting balance-Leahy Scale: Fair     Standing balance support: Bilateral upper extremity supported, During functional activity, Reliant on assistive device for balance Standing balance-Leahy Scale: Poor                             ADL either performed or assessed with clinical judgement   ADL Overall ADL's : Needs assistance/impaired Eating/Feeding: Minimal assistance;Sitting   Grooming: Minimal assistance;Sitting   Upper Body Bathing: Moderate assistance;Sitting   Lower Body Bathing: Maximal assistance;+2 for safety/equipment;+2 for physical assistance;Sit to/from stand   Upper Body Dressing : Moderate assistance;Sitting   Lower Body Dressing: Maximal assistance;+2 for physical assistance;+2 for safety/equipment;Sit to/from stand   Toilet Transfer: Moderate assistance;+2 for  physical assistance;+2 for safety/equipment;Stand-pivot;Rolling walker (2 wheels);BSC/3in1   Toileting- Clothing Manipulation and Hygiene: Maximal assistance;+2 for physical assistance;+2 for  safety/equipment;Sit to/from stand       Functional mobility during ADLs: Moderate assistance;+2 for physical assistance;+2 for safety/equipment;Rolling walker (2 wheels) General ADL Comments: limited by afib     Vision Baseline Vision/History: 1 Wears glasses Vision Assessment?: No apparent visual deficits Additional Comments: did not formally assess     Perception Perception Perception Tested?: No   Praxis Praxis Praxis tested?: Not tested    Pertinent Vitals/Pain Pain Assessment Pain Assessment: Faces Faces Pain Scale: Hurts a little bit Pain Location: generalized Pain Descriptors / Indicators: Discomfort Pain Intervention(s): Limited activity within patient's tolerance, Monitored during session     Hand Dominance Right   Extremity/Trunk Assessment Upper Extremity Assessment Upper Extremity Assessment: Generalized weakness   Lower Extremity Assessment Lower Extremity Assessment: Defer to PT evaluation   Cervical / Trunk Assessment Cervical / Trunk Assessment: Kyphotic   Communication Communication Communication: No difficulties   Cognition Arousal/Alertness: Awake/alert Behavior During Therapy: Flat affect, Restless, Impulsive Overall Cognitive Status: Impaired/Different from baseline Area of Impairment: Attention, Memory, Following commands, Safety/judgement, Awareness, Problem solving             Current Attention Level: Sustained Memory: Decreased recall of precautions Following Commands: Follows one step commands consistently Safety/Judgement: Decreased awareness of safety, Decreased awareness of deficits Awareness: Emergent Problem Solving: Slow processing, Decreased initiation, Requires verbal cues General Comments: Pt unable to detail symptoms or how he is feeling. slightly impulsive, required cues for safety, initiation and sequences     General Comments  Son present, tele HR alarming afib, resting HR 130s, elevated to 177             Home Living Family/patient expects to be discharged to:: Skilled nursing facility Living Arrangements: Children Available Help at Discharge: Family;Available PRN/intermittently Type of Home: House             Bathroom Shower/Tub: Chief Strategy Officer: Standard     Home Equipment: Agricultural consultant (2 wheels);Cane - single point;Shower seat   Additional Comments: gleaned from chart      Prior Functioning/Environment Prior Level of Function : Independent/Modified Independent             Mobility Comments: independent with RW  when he had pain per son ADLs Comments: Independent with ADLs        OT Problem List: Decreased strength;Decreased range of motion;Decreased activity tolerance;Impaired balance (sitting and/or standing);Decreased cognition;Decreased safety awareness;Decreased knowledge of use of DME or AE;Cardiopulmonary status limiting activity      OT Treatment/Interventions: Self-care/ADL training;Therapeutic exercise;Balance training;Patient/family education;Therapeutic activities;DME and/or AE instruction    OT Goals(Current goals can be found in the care plan section) Acute Rehab OT Goals Patient Stated Goal: to lay down OT Goal Formulation: With patient Time For Goal Achievement: 06/19/22 Potential to Achieve Goals: Fair ADL Goals Pt Will Perform Grooming: standing;with supervision Pt Will Perform Upper Body Dressing: with set-up;sitting Pt Will Perform Lower Body Dressing: with min guard assist;sit to/from stand Pt Will Transfer to Toilet: with min guard assist;ambulating;regular height toilet  OT Frequency: Min 2X/week       AM-PAC OT "6 Clicks" Daily Activity     Outcome Measure Help from another person eating meals?: A Little Help from another person taking care of personal grooming?: A Little Help from another person toileting, which includes using toliet, bedpan, or urinal?: A Lot Help from another person bathing (  including  washing, rinsing, drying)?: A Lot Help from another person to put on and taking off regular upper body clothing?: A Lot Help from another person to put on and taking off regular lower body clothing?: A Lot 6 Click Score: 14   End of Session Equipment Utilized During Treatment: Gait belt;Rolling walker (2 wheels) Nurse Communication: Mobility status (HR)  Activity Tolerance: Patient tolerated treatment well Patient left: in bed;with call bell/phone within reach;with bed alarm set  OT Visit Diagnosis: Unsteadiness on feet (R26.81);Other abnormalities of gait and mobility (R26.89);Muscle weakness (generalized) (M62.81)                Time: 6578-4696 OT Time Calculation (min): 14 min Charges:  OT General Charges $OT Visit: 1 Visit OT Evaluation $OT Eval Moderate Complexity: 1 Mod  Derenda Mis, OTR/L Acute Rehabilitation Services Office [phone] Secure Chat Communication Preferred  Donia Pounds 06/05/2022, 4:47 PM

## 2022-06-05 NOTE — Progress Notes (Signed)
ANTICOAGULATION CONSULT NOTE - Initial Consult  Pharmacy Consult for IV heparin Indication: atrial fibrillation  No Known Allergies  Patient Measurements: Height: 5\' 9"  (175.3 cm) Weight: 69.4 kg (153 lb) IBW/kg (Calculated) : 70.7 Heparin Dosing Weight: ~ 70 kg  Vital Signs: BP: 125/88 (05/07 1546) Pulse Rate: 59 (05/07 1546)  Labs: Recent Labs    06/03/22 0248 06/04/22 0414 06/05/22 0247 06/05/22 0251  HGB 7.1* 8.1* 7.9*  --   HCT 21.1* 24.4* 23.5*  --   PLT 128* 139* 145*  --   CREATININE 6.38* 7.39*  --  8.21*    Estimated Creatinine Clearance: 7 mL/min (A) (by C-G formula based on SCr of 8.21 mg/dL (H)).   Medical History: Past Medical History:  Diagnosis Date   Anemia    Chronic kidney disease    Dysrhythmia    GERD (gastroesophageal reflux disease)    Neurogenic bladder     Medications:  Infusions:   ferric gluconate (FERRLECIT) IVPB Stopped (06/04/22 1513)   heparin      Assessment: 82 yo male with ESRD and new afib.  Pharmacy asked to start anticoagulation with IV heparin.  Hgb stable ~ 8.  No overt bleeding or complications noted.  Goal of Therapy:  Heparin level 0.3-0.7 units/ml Monitor platelets by anticoagulation protocol: Yes   Plan:  Start IV heparin at 950 units/hr.  Given anemia, ESRD will not bolus. Check heparin level 8 hrs after gtt starts. Daily heparin level and CBC.  Reece Leader, Colon Flattery, BCCP Clinical Pharmacist  06/05/2022 4:19 PM   [facility] pharmacy phone numbers are listed on [email or web address]

## 2022-06-05 NOTE — Consult Note (Addendum)
Cardiology Consultation   Patient ID: Glenn Olson MRN: [id number]; DOB: 12-06-1940  Admit date: 05/30/2022 Date of Consult: 06/05/2022  PCP:  Benita Stabile, MD   [facility] Providers Cardiologist:  None   {    Patient Profile:   Glenn Olson is a 82 y.o. male with a hx of end-stage renal disease (has not attended dialysis in over a year), neurogenic atonic bladder, who is being seen 06/05/2022 for the evaluation of new onset A-fib/flutter with RVR at the request of Dr Tyson Babinski.  History of Present Illness:   Glenn Olson has no prior cardiac history.  Patient initially presented to the emergency room on 05/30/2022 due to complaints of headache, n/v, poor oral intake and chest pain x 1 week.  He was admitted for uremia and metabolic acidosis secondary to stopping dialysis over a year ago and cessation of taking all of his medications.  Nephrology has now resumed his hemodialysis. During admission patient was found to be in A-fib RVR thus cardiology has been consulted. Per telemetry/EKG review, he was in atrial flutter with RVR upon admission, briefly converted to NSR on 5/5, before returning to atrial flutter then developing atrial fib overnight, rates 120s-150s. He has had several electrolyte derangements and found to be  hypokalemic 2.4 on initial presentation and hypomagnesemic at 1.6.  He has been receiving supplementation and has been started on IV Cardizem 10 mg bolus. He's also been noted to have progressive worsening of anemia with Hgb as low as 6.7 this admission.  Patient is not very active and often is sedentary most of the day.  However, patient has noticed a decrease in functional status the last 2 weeks and most notably has had worsening shortness of breath the past 2 days.  Patient's heart rate has been very uncontrolled fluctuating between 110-160 on telemetry.  At rest patient is short of breath and complains of dizziness.  Patient is not aware of his atrial  fibrillation and denies any palpitations, chest pain, swelling, orthopnea.   Past Medical History:  Diagnosis Date   Anemia    Chronic kidney disease    Dysrhythmia    GERD (gastroesophageal reflux disease)    Neurogenic bladder     Past Surgical History:  Procedure Laterality Date   AV FISTULA PLACEMENT Left 09/27/2020   Procedure: LEFT ARM ARTERIOVENOUS GRAFT PLACEMENT USING GORE-TEX 4-7MM X 45CM  VASCULAR GRAFT;  Surgeon: Larina Earthly, MD;  Location: [facility];  Service: Vascular;  Laterality: Left;   CAPD INSERTION N/A 03/17/2021   Procedure: ATTEMPTED INSERTION OF LAPAROSCOPIC CONTINUOUS AMBULATORY PERITONEAL DIALYSIS (CAPD) CATHETER;  Surgeon: Maeola Harman, MD;  Location: [facility] OR;  Service: Vascular;  Laterality: N/A;   CYSTOURETHROSCOPY     INSERTION OF DIALYSIS CATHETER Right 09/27/2020   Procedure: INSERTION OF PALINDROME TUNNELED DIALYSIS CATHETER 14.21f X 23CM;  Surgeon: Larina Earthly, MD;  Location: [facility];  Service: Vascular;  Laterality: Right;   INSERTION OF DIALYSIS CATHETER Right 05/31/2022   Procedure: INSERTION OF DIALYSIS CATHETER;  Surgeon: Victorino Sparrow, MD;  Location: [facility] OR;  Service: Vascular;  Laterality: Right;   REMOVAL OF A DIALYSIS CATHETER N/A 12/06/2020   Procedure: MINOR REMOVAL OF A DIALYSIS CATHETER;  Surgeon: Larina Earthly, MD;  Location: [facility];  Service: Vascular;  Laterality: N/A;   SUPRAPUBIC CATHETER PLACEMENT       Inpatient Medications: Scheduled Meds:  sodium chloride   Intravenous Once   calcitRIOL  0.5 mcg Oral Daily  calcium acetate  1,334 mg Oral TID WC   Chlorhexidine Gluconate Cloth  6 each Topical Q0600   darbepoetin (ARANESP) injection - DIALYSIS  40 mcg Subcutaneous Q Fri-1800   loratadine  10 mg Oral Daily   potassium chloride  40 mEq Oral Q6H   Continuous Infusions:  ferric gluconate (FERRLECIT) IVPB Stopped (06/04/22 1513)   PRN Meds: acetaminophen **OR** acetaminophen, HYDROmorphone (DILAUDID) injection,  labetalol, ondansetron (ZOFRAN) IV, polyethylene glycol  Allergies:   No Known Allergies  Social History:   Social History   Socioeconomic History   Marital status: Divorced    Spouse name: Not on file   Number of children: Not on file   Years of education: Not on file   Highest education level: Not on file  Occupational History   Not on file  Tobacco Use   Smoking status: Never   Smokeless tobacco: Never  Vaping Use   Vaping Use: Never used  Substance and Sexual Activity   Alcohol use: Yes    Comment: patient states he is a social drinker   Drug use: No   Sexual activity: Not on file  Other Topics Concern   Not on file  Social History Narrative   Not on file   Social Determinants of Health   Financial Resource Strain: Not on file  Food Insecurity: Not on file  Transportation Needs: Not on file  Physical Activity: Not on file  Stress: Not on file  Social Connections: Not on file  Intimate Partner Violence: Not on file    Family History:   History reviewed. No pertinent family history.   ROS:  Please see the history of present illness. All other ROS reviewed and negative.     Physical Exam/Data:   Vitals:   06/05/22 0915 06/05/22 1201 06/05/22 1209 06/05/22 1236  BP:  115/82 116/84   Pulse: (!) 115 (!) 137 (!) 108 (!) 122  Resp:      Temp:      TempSrc:      SpO2:      Weight:      Height:        Intake/Output Summary (Last 24 hours) at 06/05/2022 1345 Last data filed at 06/05/2022 1059 Gross per 24 hour  Intake 340.49 ml  Output 1500 ml  Net -1159.51 ml      06/02/2022   10:57 AM 06/02/2022    8:18 AM 05/30/2022    2:36 PM  Last 3 Weights  Weight (lbs) 153 lb 151 lb 10.8 oz 177 lb 0.5 oz  Weight (kg) 69.4 kg 68.8 kg 80.3 kg     Body mass index is 22.59 kg/m.  General: SOB at rest HEENT: normal Neck: no JVD Vascular: No carotid bruits; Distal pulses 2+ bilaterally Cardiac: Tachycardic and irregularly irregular Lungs: Fine crackles noted in the  bases Abd: soft, nontender, no hepatomegaly  Ext: no edema Musculoskeletal:  No deformities, BUE and BLE strength normal and equal Skin: warm and dry  Neuro:  CNs 2-12 intact, no focal abnormalities noted Psych:  Normal affect   EKG:  The EKG was personally reviewed and demonstrates: A-fib RVR with a heart rate of 131.  Right bundle branch block. Telemetry:  Telemetry was personally reviewed and demonstrates: A-fib RVR with rates between 110s to 160 Relevant CV Studies:  Laboratory Data:  High Sensitivity Troponin:   Recent Labs  Lab 05/30/22 1445 05/30/22 1640  TROPONINIHS 48* 52*     Chemistry Recent Labs  Lab 06/03/22 0248 06/04/22  1610 06/05/22 0247 06/05/22 0251  NA 136 138  --  140  K 2.4* 2.4*  --  3.1*  CL 94* 91*  --  94*  CO2 23 27  --  27  GLUCOSE 68* 91  --  96  BUN 77* 79*  --  81*  CREATININE 6.38* 7.39*  --  8.21*  CALCIUM 5.9* 6.2*  --  6.6*  MG 1.8 1.7 1.6*  --   GFRNONAA 8* 7*  --  6*  ANIONGAP 19* 20*  --  19*    Recent Labs  Lab 05/30/22 1445 06/01/22 0759 06/02/22 0411 06/05/22 0251  PROT 7.9  --   --   --   ALBUMIN 4.1 3.4* 3.2* 2.7*  AST 12*  --   --   --   ALT 16  --   --   --   ALKPHOS 103  --   --   --   BILITOT 0.7  --   --   --    Lipids No results for input(s): "CHOL", "TRIG", "HDL", "LABVLDL", "LDLCALC", "CHOLHDL" in the last 168 hours.  Hematology Recent Labs  Lab 06/03/22 0248 06/04/22 0414 06/05/22 0247  WBC 6.7 8.5 8.6  RBC 2.41* 2.72* 2.61*  HGB 7.1* 8.1* 7.9*  HCT 21.1* 24.4* 23.5*  MCV 87.6 89.7 90.0  MCH 29.5 29.8 30.3  MCHC 33.6 33.2 33.6  RDW 15.6* 15.3 15.0  PLT 128* 139* 145*   Thyroid No results for input(s): "TSH", "FREET4" in the last 168 hours.  BNPNo results for input(s): "BNP", "PROBNP" in the last 168 hours.  DDimer No results for input(s): "DDIMER" in the last 168 hours.   Radiology/Studies:  No results found.   Assessment and Plan:   New onset A-fib/Flutter RVR in the setting of  end-stage renal disease electrolyte derangements Patient admitted for uremia secondary to stopping HD with end-stage renal disease. He has complaints of worsening and progressive shortness of breath at rest and dizziness. He has been given a one-time dose of IV Cardizem 10 mg and still has persistently uncontrolled/elevated heart rates 120-160.  Arrhythmia likely due to ESRD and metabolic acidosis with electrolyte derangements.  He has not yet had specific attempts at rate control aside from diltiazem bolus.  Will start PO dilt 30mg  Q6. Given location not able to start IV drip. Nurse will notify team if patient responds or if he needs to be titrated further. TEE/DCCV could be considered if we are unable to achieve rate control and arrhythmia persists, keeping in mind he did spontaneously convert to NSR on 5/5 before reverting back out of rhythm Start heparin trial for anticoagulation with plans to place on Eliquis with reduced dosing of 2.5 mg twice daily due to age and impaired renal function.  There is some concern of compliance with this due to prior history of abrupt cessation of HD.  Have discussed with patient and son recognized the important of this and were agreeable.  Euvolemic on exam, however crackles noted on PE. Consider CXR.  TSH pending  Hypokalemia, hypocalcemia, hypomagnesemia Continues to require repletion.  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 The patient's score is based upon: CHF History: 0 HTN History: 0 Diabetes History: 0 Stroke History: 0 Vascular Disease History: 0 Age Score: 2 Gender Score: 0  For questions or updates, please contact [facility] Please consult [email or web address] for contact info under    Signed, Abagail Kitchens, PA-C  06/05/2022 1:45  PM   I have examined the patient and reviewed assessment and plan and discussed with patient.  Agree with above as stated.    Patient with atrial fibrillation with rapid ventricular response.  He has not been on any rate control medication.  Despite his high heart rate, he appears comfortable.  He is not very symptomatic with his atrial fibrillation.  Start diltiazem 30 mg p.o. every 6 hours.  Would titrate up the dose rapidly as his blood pressure allows.  Blood pressures ranging from 120s to 140 systolic.  He should tolerate some diltiazem.  He has been anemic.  In terms of anticoagulation, would have to make sure there are no bleeding issues.  If no significant bleeding issues, he does benefit in terms of risk reduction for stroke with anticoagulation.  Will check with pharmacist as what might be his best long-term option.  For now, could start IV heparin and make sure he does not have any bleeding issues.  If you were to get more symptomatic or we were unable to control his heart rates, could consider TEE cardioversion.  Transthoracic echo showed normal LVEF.  Will follow.  Lance Muss

## 2022-06-05 NOTE — Progress Notes (Signed)
Echocardiogram 2D Echocardiogram has been performed.  Glenn Olson 06/05/2022, 1:14 PM

## 2022-06-05 NOTE — Progress Notes (Addendum)
PROGRESS NOTE    Glenn Olson  NWG:[id number] DOB: 1940/12/14 DOA: 05/30/2022 PCP: Benita Stabile, MD    Brief Narrative:   Glenn Olson is a 82 y.o. male with medical history significant for ESRD, neurogenic atonic bladder presented to the hospital with headache and chest pain for 1 week.  Patient was on hemodialysis but had stopped going for hemodialysis for 1 year now.  Has been having poor oral intake with nausea and vomiting.  In the ED, patient was mildly tachypneic and tachycardic with elevated blood pressure.  Chest x-ray with increased left lower lower lobe markings. CO2 less than 7.  BUN of 241.  Potassium 4.5.  Creatinine 13.8.Marland KitchenTroponin 48 > 52.  Nephrology was consulted and decision was to proceed with hemodialysis.  Patient was then transferred to [facility].  At this time, patient has received vascular catheter placed in by vascular surgery has been started on hemodialysis.  Currently getting hemodialysis.  Plan for CIR but has been having tachyarrhythmia today.   Assessment and Plan:  * Uremia secondary to end-stage renal disease Had not been on dialysis for 1 year.   Vascular surgery has placed in tunneled hemodialysis catheter for hemodialysis and patient has been resumed on hemodialysis.   Further plans as per nephrology.  Tachyarrhythmia likely A-fib with RVR.  Will continue replacement of magnesium and potassium.  Will inform cardiology.  Check TSH and 2D echocardiogram.  Will give 1 dose of Cardizem 10 mg IV.  Will get cardiology evaluation for new diagnosis of atrial fibrillation.  Hypokalemia.   Significant hypokalemia with potassium of 2.4 initial presentation.  Has been replenished.  Latest potassium of 3.1.  Continue oral potassium 40 every 6 for 2 doses.  Received 20 mg Ellens this morning.  Hypocalcemia.  Has been receiving calcium gluconate.  Will repeat again today.  Latest calcium level of 6.6  Hypomagnesemia.  Magnesium of 1.6 will receive 1 g of  magnesium sulfate.   Chest pain   Nonspecific and generalized chest pain.   Resolved  Atonic neurogenic bladder Continue suprapubic catheter  Anemia of chronic kidney disease.   Hemoglobin of 7.9 today from initial 6.7.  Status post 1 unit of packed RBC transfusion.  No evidence of gross bleeding or blood loss at this time.  Iron profile shows iron of 31.  Receiving iron transfusion.  Deconditioning, debility.  Physical therapy evaluation has recommend skilled nursing facility.   DVT prophylaxis: SCDs Start: 05/30/22 1926   Code Status:     Code Status: DNR  Disposition:  Patient has been considered for CIR placement at this time.  Status is: Inpatient  Remains inpatient appropriate because: Uremia need for hemodialysis,need for CIR, tachyarrhythmia   Family Communication:  Spoke with multiple family members including son and daughter at bedside on 06/04/2022.  Spoke with the patient's son on the phone today.  Consultants:  Nephrology Vascular surgery Palliative care Cardiology  Procedures:  Right internal jugular tunneled hemodialysis catheter placement Hemodialysis  Antimicrobials:  None  Anti-infectives (From admission, onward)    Start     Dose/Rate Route Frequency Ordered Stop   05/31/22 1115  ceFAZolin (ANCEF) IVPB 2g/100 mL premix        2 g 200 mL/hr over 30 Minutes Intravenous  Once 05/31/22 1108 05/31/22 1200   05/31/22 1112  ceFAZolin (ANCEF) 2-4 GM/100ML-% IVPB       Note to Pharmacy: Shanda Bumps M: cabinet override      05/31/22 1112 05/31/22 1206  Subjective: Today, patient was seen and examined at bedside.  Patient denies any nausea, vomiting, fever, chills or rigor but has a low appetite.  He is trying to eat better.  Reported irregular heartbeat with tachycardia  Objective: Vitals:   06/05/22 0125 06/05/22 0451 06/05/22 0816 06/05/22 0915  BP: 116/81 136/87 (!) 148/90   Pulse: (!) 109 99 (!) 123 (!) 115  Resp:  16    Temp:       TempSrc:      SpO2:  97% 97%   Weight:      Height:        Intake/Output Summary (Last 24 hours) at 06/05/2022 1149 Last data filed at 06/05/2022 1059 Gross per 24 hour  Intake 340.49 ml  Output 1500 ml  Net -1159.51 ml    Filed Weights   05/30/22 1436 06/02/22 0818 06/02/22 1057  Weight: 80.3 kg 68.8 kg 69.4 kg    Physical Examination: Body mass index is 22.59 kg/m.   General: Alert awake and Communicative today, elderly male, not in obvious distress  HENT:   No scleral pallor or icterus noted. Oral mucosa is moist Chest:   Diminished breath sounds bilaterally. No crackles or wheezes.  CVS: S1 &S2 heard. No murmur.  Irregularly irregular rhythm. Abdomen: Soft, nontender, nondistended.  Bowel sounds are heard.   Extremities: No cyanosis, clubbing or edema.  Peripheral pulses are palpable. Psych: Alert, awake and Communicative, oriented to self and place, CNS:  No cranial nerve deficits.  Generalized weakness, moves all extremities Skin: Warm and dry.  No rashes noted.    Data Reviewed:   CBC: Recent Labs  Lab 05/30/22 1445 05/31/22 0723 06/01/22 0759 06/02/22 0411 06/03/22 0248 06/04/22 0414 06/05/22 0247  WBC 9.3   < > 6.5 7.6 6.7 8.5 8.6  NEUTROABS 8.3*  --   --   --   --   --   --   HGB 8.6*   < > 6.7* 7.6* 7.1* 8.1* 7.9*  HCT 25.5*   < > 20.0* 21.5* 21.1* 24.4* 23.5*  MCV 89.5   < > 87.3 84.3 87.6 89.7 90.0  PLT 226   < > 149* 136* 128* 139* 145*   < > = values in this interval not displayed.     Basic Metabolic Panel: Recent Labs  Lab 06/01/22 0759 06/02/22 0411 06/03/22 0248 06/04/22 0414 06/05/22 0247 06/05/22 0251  NA 140 137 136 138  --  140  K 3.3* 3.1* 2.4* 2.4*  --  3.1*  CL 100 94* 94* 91*  --  94*  CO2 12* 17* 23 27  --  27  GLUCOSE 77 77 68* 91  --  96  BUN 206* 143* 77* 79*  --  81*  CREATININE 12.40* 9.38* 6.38* 7.39*  --  8.21*  CALCIUM 5.5* 5.6* 5.9* 6.2*  --  6.6*  MG  --  1.3* 1.8 1.7 1.6*  --   PHOS 9.3* 7.4*  --   --   --   5.8*     Liver Function Tests: Recent Labs  Lab 05/30/22 1445 06/01/22 0759 06/02/22 0411 06/05/22 0251  AST 12*  --   --   --   ALT 16  --   --   --   ALKPHOS 103  --   --   --   BILITOT 0.7  --   --   --   PROT 7.9  --   --   --   ALBUMIN 4.1  3.4* 3.2* 2.7*      Radiology Studies: No results found.    LOS: 6 days    Joycelyn Das, MD [facility] Available via Epic secure chat 7am-7pm After these hours, please refer to coverage provider listed on [email or web address] 06/05/2022, 11:49 AM

## 2022-06-05 NOTE — Progress Notes (Signed)
Inpatient Rehab Admissions Coordinator:   Consult received and chart reviewed.  Note still working out HD and rate control with new afib/rvr.  I will f/u with patient at bedside tomorrow to discuss CIR.   Estill Dooms, PT, DPT Admissions Coordinator [phone] 06/05/22  4:32 PM

## 2022-06-06 ENCOUNTER — Other Ambulatory Visit (HOSPITAL_COMMUNITY): Payer: Self-pay

## 2022-06-06 ENCOUNTER — Telehealth (HOSPITAL_COMMUNITY): Payer: Self-pay | Admitting: Pharmacy Technician

## 2022-06-06 DIAGNOSIS — N186 End stage renal disease: Secondary | ICD-10-CM | POA: Diagnosis not present

## 2022-06-06 DIAGNOSIS — Z992 Dependence on renal dialysis: Secondary | ICD-10-CM | POA: Diagnosis not present

## 2022-06-06 DIAGNOSIS — I483 Typical atrial flutter: Secondary | ICD-10-CM

## 2022-06-06 DIAGNOSIS — Z978 Presence of other specified devices: Secondary | ICD-10-CM | POA: Diagnosis not present

## 2022-06-06 DIAGNOSIS — N19 Unspecified kidney failure: Secondary | ICD-10-CM | POA: Diagnosis not present

## 2022-06-06 DIAGNOSIS — I4892 Unspecified atrial flutter: Secondary | ICD-10-CM | POA: Insufficient documentation

## 2022-06-06 DIAGNOSIS — E876 Hypokalemia: Secondary | ICD-10-CM

## 2022-06-06 DIAGNOSIS — I4819 Other persistent atrial fibrillation: Secondary | ICD-10-CM

## 2022-06-06 DIAGNOSIS — I4891 Unspecified atrial fibrillation: Secondary | ICD-10-CM

## 2022-06-06 LAB — RENAL FUNCTION PANEL
Albumin: 3 g/dL — ABNORMAL LOW (ref 3.5–5.0)
Anion gap: 17 — ABNORMAL HIGH (ref 5–15)
BUN: 87 mg/dL — ABNORMAL HIGH (ref 8–23)
CO2: 24 mmol/L (ref 22–32)
Calcium: 6.9 mg/dL — ABNORMAL LOW (ref 8.9–10.3)
Chloride: 94 mmol/L — ABNORMAL LOW (ref 98–111)
Creatinine, Ser: 8.13 mg/dL — ABNORMAL HIGH (ref 0.61–1.24)
GFR, Estimated: 6 mL/min — ABNORMAL LOW (ref >60)
Glucose, Bld: 127 mg/dL — ABNORMAL HIGH (ref 70–99)
Phosphorus: 4.8 mg/dL — ABNORMAL HIGH (ref 2.5–4.6)
Potassium: 3.8 mmol/L (ref 3.5–5.1)
Sodium: 135 mmol/L (ref 135–145)

## 2022-06-06 LAB — CBC
HCT: 25.8 % — ABNORMAL LOW (ref 39.0–52.0)
Hemoglobin: 8.1 g/dL — ABNORMAL LOW (ref 13.0–17.0)
MCH: 28.9 pg (ref 26.0–34.0)
MCHC: 31.4 g/dL (ref 30.0–36.0)
MCV: 92.1 fL (ref 80.0–100.0)
Platelets: 149 10*3/uL — ABNORMAL LOW (ref 150–400)
RBC: 2.8 MIL/uL — ABNORMAL LOW (ref 4.22–5.81)
RDW: 14.8 % (ref 11.5–15.5)
WBC: 9.2 10*3/uL (ref 4.0–10.5)
nRBC: 0 % (ref 0.0–0.2)

## 2022-06-06 LAB — HEPARIN LEVEL (UNFRACTIONATED)
Heparin Unfractionated: <0.1 IU/mL — ABNORMAL LOW (ref 0.30–0.70)
Heparin Unfractionated: <0.1 IU/mL — ABNORMAL LOW (ref 0.30–0.70)
Heparin Unfractionated: <0.1 IU/mL — ABNORMAL LOW (ref 0.30–0.70)

## 2022-06-06 LAB — MAGNESIUM: Magnesium: 2.1 mg/dL (ref 1.7–2.4)

## 2022-06-06 MED ORDER — ALTEPLASE 2 MG IJ SOLR: 2.0000 mg | Freq: Once | INTRAMUSCULAR | Status: DC | PRN

## 2022-06-06 MED ORDER — HEPARIN SODIUM (PORCINE) 1000 UNIT/ML DIALYSIS
1000.0000 [IU] | INTRAMUSCULAR | Status: DC | PRN
  Administered 2022-06-06: 1000 [IU]
  Filled 2022-06-06 (×2): qty 1

## 2022-06-06 MED ORDER — ANTICOAGULANT SODIUM CITRATE 4% (200MG/5ML) IV SOLN: 5.0000 mL | Status: DC | PRN

## 2022-06-06 MED ORDER — DILTIAZEM HCL 60 MG PO TABS
120.0000 mg | ORAL_TABLET | Freq: Every day | ORAL | Status: DC
  Administered 2022-06-07: 120 mg via ORAL
  Filled 2022-06-06: qty 4
  Filled 2022-06-06: qty 2

## 2022-06-06 NOTE — Progress Notes (Signed)
ANTICOAGULATION CONSULT NOTE  Pharmacy Consult for IV heparin Indication: atrial fibrillation  No Known Allergies  Patient Measurements: Height: 5\' 9"  (175.3 cm) Weight: 69.4 kg (153 lb) IBW/kg (Calculated) : 70.7 Heparin Dosing Weight: ~ 70 kg  Vital Signs: Temp: 98.8 F (37.1 C) (05/07 1958) BP: 147/109 (05/08 0031) Pulse Rate: 158 (05/08 0031)  Labs: Recent Labs    06/04/22 0414 06/05/22 0247 06/05/22 0251 06/06/22 0053  HGB 8.1* 7.9*  --  8.1*  HCT 24.4* 23.5*  --  25.8*  PLT 139* 145*  --  149*  HEPARINUNFRC  --   --   --  <0.10*  CREATININE 7.39*  --  8.21* 8.13*     Estimated Creatinine Clearance: 7.1 mL/min (A) (by C-G formula based on SCr of 8.13 mg/dL (H)).   Medical History: Past Medical History:  Diagnosis Date   Anemia    Chronic kidney disease    Dysrhythmia    GERD (gastroesophageal reflux disease)    Neurogenic bladder     Medications:  Infusions:   ferric gluconate (FERRLECIT) IVPB Stopped (06/04/22 1513)   heparin 950 Units/hr (06/05/22 1710)    Assessment: 82 yo male with ESRD and new afib.  Pharmacy asked to start anticoagulation with IV heparin.  Hgb stable ~ 8.  No overt bleeding or complications noted.  5/8 AM update:  Heparin level sub-therapeutic   Goal of Therapy:  Heparin level 0.3-0.7 units/ml Monitor platelets by anticoagulation protocol: Yes   Plan:  Inc heparin to 1100 units/hr 1000 heparin level  Abran Duke, PharmD, BCPS Clinical Pharmacist Phone: [phone]

## 2022-06-06 NOTE — Progress Notes (Signed)
Admit: 05/30/2022 LOS: 7  Glenn Olson is an 82 year old male who presented with symptoms of fatigue, headache, chest pain in setting of quitting HD quit about 14 months ago because he did not like going and stopped all of his medications. HD catheter was placed by [facility] 5/2 and patient has resumed HD.   Subjective:  Patient was lying in bed resting, denies shortness of breath or chest pain, denied pain anywhere.  Admitted to fatigue.  Would like to continue with HD.  05/07 0701 - 05/08 0700 In: 153.1 [I.V.:3.1; IV Piggyback:150] Out: 725 [Urine:725]  Filed Weights   05/30/22 1436 06/02/22 0818 06/02/22 1057  Weight: 80.3 kg 68.8 kg 69.4 kg    Scheduled Meds:  sodium chloride   Intravenous Once   calcitRIOL  0.5 mcg Oral Daily   calcium acetate  1,334 mg Oral TID WC   Chlorhexidine Gluconate Cloth  6 each Topical Q0600   darbepoetin (ARANESP) injection - DIALYSIS  40 mcg Subcutaneous Q Fri-1800   diltiazem  30 mg Oral Q6H   loratadine  10 mg Oral Daily   Continuous Infusions:  ferric gluconate (FERRLECIT) IVPB Stopped (06/04/22 1513)   heparin 1,100 Units/hr (06/06/22 0237)   PRN Meds:.acetaminophen **OR** acetaminophen, HYDROmorphone (DILAUDID) injection, labetalol, ondansetron (ZOFRAN) IV, polyethylene glycol  Current Labs: reviewed    Physical Exam:  Blood pressure 136/88, pulse 73, temperature 98.8 F (37.1 C), resp. rate 16, height 5\' 9"  (1.753 m), weight 69.4 kg, SpO2 99 %. General: 82 year old frail male, lying in bed, NAD Cardio: Irregularly irregular Lungs: CTAB, normal effort Abdomen: Soft, nontender palpation, nondistended Extremities: No edema of BLEs  A ESRD and uremia: HD catheter placed 5/2, HD session on 5/2 cut short to 30 minutes due to patient agitation.  HD 06/01/22 cut short 15 min due to machine error and tolerated HD well on 06/02/2022-no sitter needed. Did not have HD 5/6 or 5/7 d/t HR in 130's and hypokalemia. Uremia stable with BUN 87 today.  A fib w/  RVR - new this admission. Cardiology is on board and started po dilt and heparin yesterday with plans to switch to eliquis. Echo showed EF 55-60% Anemia of CKD V: Hgb stable at 8.1 today. today. S/p I Unit RBCs on 5/3. 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 Phosphorous 9.3 (5 days ago) >4.8 today with phoslo. Ca 5.5 (5 days ago) >6.9 today s/p calcium gluconate and calcitriol daily Neurogenic bladder Hypokalemia - K 3.8 today s/p K repletion yesterday  Hypomagnesemia - 2.1  today s/p mag repletion yesterday Metabolic acidosis resolved, bicarb 24 today   P ESRD and uremia: Will plan to resume HD today and will need outpatient arrangements at [facility] when stable for discharge. Vascular access - he has a functioning AVG in his left forearm, as well as a RIJ TDC.  Given his confusion, would not be safe to use AVG initially.  He does not want to use AVG at this time.  Will continue with [facility] for now.  Will address this in the future if he tolerates HD.   A fib w/ RVR - management per cardiology Anemia of CKD V:. CTM hgb. Started Aranesp 40 mcg weekly.  Transfuse prn with transfusion threshold hgb <7.  IV iron w/ HD CKD-BMD - Continue PhosLo and calcitriol.  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 Morphine should not be used.  Baclofen should be avoided Avoid oral sodium phosphate and magnesium citrate based laxatives / bowel preps  Erick Alley, DO Family Medicine, PGY-2  Recent Labs  Lab 06/02/22 0411 06/03/22 0248 06/04/22 0414 06/05/22 0251 06/06/22 0053  NA 137   < > 138 140 135  K 3.1*   < > 2.4* 3.1* 3.8  CL 94*   < > 91* 94* 94*  CO2 17*   < > 27 27 24   GLUCOSE 77   < > 91 96 127*  BUN 143*   < > 79* 81* 87*  CREATININE  9.38*   < > 7.39* 8.21* 8.13*  CALCIUM 5.6*   < > 6.2* 6.6* 6.9*  PHOS 7.4*  --   --  5.8* 4.8*   < > = values in this interval not displayed.   Recent Labs  Lab 05/30/22 1445 05/31/22 0723 06/04/22 0414 06/05/22 0247 06/06/22 0053  WBC 9.3   < > 8.5 8.6 9.2  NEUTROABS 8.3*  --   --   --   --   HGB 8.6*   < > 8.1* 7.9* 8.1*  HCT 25.5*   < > 24.4* 23.5* 25.8*  MCV 89.5   < > 89.7 90.0 92.1  PLT 226   < > 139* 145* 149*   < > = values in this interval not displayed.

## 2022-06-06 NOTE — Progress Notes (Signed)
ANTICOAGULATION CONSULT NOTE  Pharmacy Consult for IV heparin Indication: atrial fibrillation  No Known Allergies  Patient Measurements: Height: 5\' 9"  (175.3 cm) Weight: 69.4 kg (153 lb) IBW/kg (Calculated) : 70.7 Heparin Dosing Weight: ~ 70 kg  Vital Signs: Temp: 97.5 F (36.4 C) (05/08 0733) Temp Source: Oral (05/08 0733) BP: 117/96 (05/08 0733) Pulse Rate: 108 (05/08 0733)  Labs: Recent Labs    06/04/22 0414 06/05/22 0247 06/05/22 0251 06/06/22 0053 06/06/22 1023  HGB 8.1* 7.9*  --  8.1*  --   HCT 24.4* 23.5*  --  25.8*  --   PLT 139* 145*  --  149*  --   HEPARINUNFRC  --   --   --  <0.10* <0.10*  CREATININE 7.39*  --  8.21* 8.13*  --      Estimated Creatinine Clearance: 7.1 mL/min (A) (by C-G formula based on SCr of 8.13 mg/dL (H)).   Medical History: Past Medical History:  Diagnosis Date   Anemia    Chronic kidney disease    Dysrhythmia    GERD (gastroesophageal reflux disease)    Neurogenic bladder     Medications:  Infusions:   ferric gluconate (FERRLECIT) IVPB Stopped (06/04/22 1513)   heparin 1,100 Units/hr (06/06/22 0237)    Assessment: 82 yo male with ESRD and new afib.  Pharmacy asked to start anticoagulation with IV heparin.   Heparin level came back undetectable on 1100 units/hr. Hgb 8.1, plt 149. No s/sx of bleeding or infusion issues - given HD restricted access but drawn appropriately to lab protocol.   Goal of Therapy:  Heparin level 0.3-0.7 units/ml Monitor platelets by anticoagulation protocol: Yes   Plan:  Increase heparin infusion to 1250 units/hr Order heparin level in 8 hours Monitor daily HL, CBC, and for s/sx of bleeding   Thank you for allowing pharmacy to participate in this patient's care,  Sherron Monday, PharmD, BCCCP Clinical Pharmacist  Phone: [phone] 06/06/2022 11:20 AM  Please check AMION for all [facility] phone numbers After 10:00 PM, call Main Pharmacy [phone]

## 2022-06-06 NOTE — Progress Notes (Signed)
     Referral previously received for Glenn Olson for goals of care discussion. Chart reviewed and updates received from RN. See last PMT note dated 06/05/2022.  Chart reviewed and no significant clinical changes or ongoing palliative needs noted. At this time goals are clear. We will sign off for now and discontinue consult order. Please contact us and enter a new consult order for any new palliative care needs.  Thank you for your referral and allowing PMT to assist in [name]'s care.   Wynne Dust, NP Palliative Medicine Team Phone: [phone]  NO CHARGE

## 2022-06-06 NOTE — Progress Notes (Signed)
Pt off unit to dialysis. Heparin gtt infusing at 12.68mL/hr.

## 2022-06-06 NOTE — TOC Benefit Eligibility Note (Signed)
Patient Product/process development scientist completed.    The patient is currently admitted and upon discharge could be taking Eliquis 2.5 mg.  The current 30 day co-pay is $43.00.   The patient is insured through [employer] [facility] only [facility] that takes T[employer]are)   This test claim was processed through [employer]- copay amounts may vary at other pharmacies due to pharmacy/plan contracts, or as the patient moves through the different stages of their insurance plan.  Roland Earl, CPHT Pharmacy Patient Advocate Specialist [facility] Patient Advocate Team Direct Number: [phone]  Fax: [phone]

## 2022-06-06 NOTE — Telephone Encounter (Signed)
Pharmacy Patient Advocate Encounter  Insurance verification completed.    The patient is insured through [employer]   The patient is currently admitted and ran test claims for the following: Eliquis.  Copays and coinsurance results were relayed to Inpatient clinical team.      

## 2022-06-06 NOTE — Progress Notes (Signed)
PROGRESS NOTE  Glenn Olson ZOX:[id number] DOB: August 21, 1940   PCP: Benita Stabile, MD  Patient is from: Home  DOA: 05/30/2022 LOS: 7  Chief complaints Chief Complaint  Patient presents with   Headache     Brief Narrative / Interim history: 82 y.o. male with medical history significant for ESRD, neurogenic atonic bladder presented to [facility] with headache and chest pain for 1 week.  Patient was on hemodialysis but had stopped going for hemodialysis for 1 year now.  Has been having poor oral intake with nausea and vomiting.  In the ED, patient was mildly tachypneic and tachycardic with elevated blood pressure.  Chest x-ray with increased left lower lower lobe markings. CO2 less than 7.  BUN of 241.  Potassium 4.5.  Cr 13.8.Marland KitchenTroponin 48 > 52.  Nephrology was consulted and decision was to proceed with hemodialysis.  Patient was then transferred to [facility].    Patient has had HD cath placed and started on hemodialysis.  Encephalopathy improving.  Hospital course complicated by A-fib with RVR.  Cardiology consulted.  He was started on p.o. Cardizem and IV heparin.  TTE without significant finding.   Therapy recommended CIR.  CIR following.    Subjective: Seen and examined earlier this morning.  No major events overnight of this morning.  He is awake and alert but not a great historian.  He is oriented to self "Glenn Olson" and follows commands.  Objective: Vitals:   06/05/22 1958 06/06/22 0031 06/06/22 0536 06/06/22 0733  BP: 122/64 (!) 147/109 136/88 (!) 117/96  Pulse: (!) 102 (!) 158 73 (!) 108  Resp: 16  16 16   Temp: 98.8 F (37.1 C)   (!) 97.5 F (36.4 C)  TempSrc:    Oral  SpO2: 93%  99% 94%  Weight:      Height:        Examination:  GENERAL: No apparent distress.  Nontoxic. HEENT: MMM.  Vision and hearing grossly intact.  NECK: Supple.  No apparent JVD.  RESP:  No IWOB.  Fair aeration bilaterally. CVS: Irregular rhythm.  Normal rate.  Heart sounds  normal.  ABD/GI/GU: BS+. Abd soft, NTND.  MSK/EXT:  Moves extremities. No apparent deformity. No edema.  SKIN: no apparent skin lesion or wound NEURO: Awake.  Oriented to self, "Glenn Olson".  Follows commands.  No apparent focal neuro deficit. PSYCH: Calm. Normal affect.   Procedures:  HD catheter placed  Microbiology summarized: MRSA PCR screen nonreactive.  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 Has had HD cath placed and started on dialysis.  Encephalopathy improving. -Plan per nephrology. -Reorientation and delirium precaution  New onset a flutter: Due to the above?  TTE without significant finding.  TSH was normal. -Per cardiology-on p.o. Cardizem and IV heparin -Continue p.o. Cardizem -Optimize electrolytes   Hypokalemia/hypocalcemia/hypomagnesemia -Per nephrology   Atonic neurogenic bladder -Continue suprapubic catheter   Anemia of chronic kidney disease:  H&H stable. Recent Labs    05/30/22 1445 05/31/22 0723 05/31/22 1117 06/01/22 0759 06/02/22 0411 06/03/22 0248 06/04/22 0414 06/05/22 0247 06/06/22 0053  HGB 8.6* 7.3* 7.5* 6.7* 7.6* 7.1* 8.1* 7.9* 8.1*  -Continue monitoring -Continue IV iron  Hemoglobin of 7.9 today from initial 6.7.  Status post 1 unit of packed RBC transfusion.  No evidence of gross bleeding or blood loss at this time.  Iron profile shows iron of 31.  Receiving iron transfusion.   Deconditioning, debility.  -Therapy recommended SNF  Body mass index is 22.59 kg/m.          DVT prophylaxis:  SCDs Start: 05/30/22 1926 On full dose anticoagulation  Code Status: DNR/DNI Family Communication: None at bedside Level of care: Telemetry Medical Status is: Inpatient Remains  inpatient appropriate because: Uremic encephalopathy   Final disposition: SNF with HD Consultants:  Nephrology Cardiology  35 minutes with more than 50% spent in reviewing records, counseling patient/family and coordinating care.   Sch Meds:  Scheduled Meds:  sodium chloride   Intravenous Once   calcitRIOL  0.5 mcg Oral Daily   calcium acetate  1,334 mg Oral TID WC   Chlorhexidine Gluconate Cloth  6 each Topical Q0600   darbepoetin (ARANESP) injection - DIALYSIS  40 mcg Subcutaneous Q Fri-1800   diltiazem  30 mg Oral Q6H   loratadine  10 mg Oral Daily   Continuous Infusions:  anticoagulant sodium citrate     ferric gluconate (FERRLECIT) IVPB Stopped (06/04/22 1513)   heparin 1,100 Units/hr (06/06/22 0237)   PRN Meds:.acetaminophen **OR** acetaminophen, alteplase, anticoagulant sodium citrate, heparin, HYDROmorphone (DILAUDID) injection, labetalol, ondansetron (ZOFRAN) IV, polyethylene glycol  Antimicrobials: Anti-infectives (From admission, onward)    Start     Dose/Rate Route Frequency Ordered Stop   05/31/22 1115  ceFAZolin (ANCEF) IVPB 2g/100 mL premix        2 g 200 mL/hr over 30 Minutes Intravenous  Once 05/31/22 1108 05/31/22 1200   05/31/22 1112  ceFAZolin (ANCEF) 2-4 GM/100ML-% IVPB       Note to Pharmacy: Shanda Bumps M: cabinet override      05/31/22 1112 05/31/22 1206        I have personally reviewed the following labs and images: CBC: Recent Labs  Lab 05/30/22 1445 05/31/22 0723 06/02/22 0411 06/03/22 0248 06/04/22 0414 06/05/22 0247 06/06/22 0053  WBC 9.3   < > 7.6 6.7 8.5 8.6 9.2  NEUTROABS 8.3*  --   --   --   --   --   --   HGB 8.6*   < > 7.6* 7.1* 8.1* 7.9* 8.1*  HCT 25.5*   < > 21.5* 21.1* 24.4* 23.5* 25.8*  MCV 89.5   < > 84.3 87.6 89.7 90.0 92.1  PLT 226   < > 136* 128* 139* 145* 149*   < > = values in this interval not displayed.   BMP &GFR Recent Labs  Lab 06/01/22 0759 06/02/22 0411 06/03/22 0248 06/04/22 0414  06/05/22 0247 06/05/22 0251 06/06/22 0053  NA 140 137 136 138  --  140 135  K 3.3* 3.1* 2.4* 2.4*  --  3.1* 3.8  CL 100 94* 94* 91*  --  94* 94*  CO2 12* 17* 23 27  --  27 24  GLUCOSE 77 77 68* 91  --  96 127*  BUN 206* 143* 77* 79*  --  81* 87*  CREATININE 12.40* 9.38* 6.38* 7.39*  --  8.21* 8.13*  CALCIUM 5.5* 5.6* 5.9* 6.2*  --  6.6* 6.9*  MG  --  1.3* 1.8 1.7 1.6*  --  2.1  PHOS 9.3* 7.4*  --   --   --  5.8* 4.8*   Estimated Creatinine Clearance: 7.1 mL/min (A) (by C-G formula based on SCr of 8.13  mg/dL (H)). Liver & Pancreas: Recent Labs  Lab 05/30/22 1445 06/01/22 0759 06/02/22 0411 06/05/22 0251 06/06/22 0053  AST 12*  --   --   --   --   ALT 16  --   --   --   --   ALKPHOS 103  --   --   --   --   BILITOT 0.7  --   --   --   --   PROT 7.9  --   --   --   --   ALBUMIN 4.1 3.4* 3.2* 2.7* 3.0*   No results for input(s): "LIPASE", "AMYLASE" in the last 168 hours. No results for input(s): "AMMONIA" in the last 168 hours. Diabetic: No results for input(s): "HGBA1C" in the last 72 hours. Recent Labs  Lab 05/31/22 2257 06/01/22 0059 06/01/22 2044  GLUCAP 64* 71 76   Cardiac Enzymes: No results for input(s): "CKTOTAL", "CKMB", "CKMBINDEX", "TROPONINI" in the last 168 hours. No results for input(s): "PROBNP" in the last 8760 hours. Coagulation Profile: No results for input(s): "INR", "PROTIME" in the last 168 hours. Thyroid Function Tests: Recent Labs    06/05/22 0251  TSH 1.494   Lipid Profile: No results for input(s): "CHOL", "HDL", "LDLCALC", "TRIG", "CHOLHDL", "LDLDIRECT" in the last 72 hours. Anemia Panel: No results for input(s): "VITAMINB12", "FOLATE", "FERRITIN", "TIBC", "IRON", "RETICCTPCT" in the last 72 hours. Urine analysis:    Component Value Date/Time   COLORURINE YELLOW 11/29/2020 1705   APPEARANCEUR CLOUDY (A) 11/29/2020 1705   LABSPEC 1.006 11/29/2020 1705   PHURINE 8.0 11/29/2020 1705   GLUCOSEU NEGATIVE 11/29/2020 1705   HGBUR SMALL  (A) 11/29/2020 1705   BILIRUBINUR NEGATIVE 11/29/2020 1705   KETONESUR NEGATIVE 11/29/2020 1705   PROTEINUR 100 (A) 11/29/2020 1705   NITRITE NEGATIVE 11/29/2020 1705   LEUKOCYTESUR LARGE (A) 11/29/2020 1705   Sepsis Labs: Invalid input(s): "PROCALCITONIN", "LACTICIDVEN"  Microbiology: Recent Results (from the past 240 hour(s))  MRSA Next Gen by PCR, Nasal     Status: None   Collection Time: 06/04/22  8:36 AM   Specimen: Nasal Mucosa; Nasal Swab  Result Value Ref Range Status   MRSA by PCR Next Gen NOT DETECTED NOT DETECTED Final    Comment: (NOTE) The GeneXpert MRSA Assay (FDA approved for NASAL specimens only), is one component of a comprehensive MRSA colonization surveillance program. It is not intended to diagnose MRSA infection nor to guide or monitor treatment for MRSA infections. Test performance is not FDA approved in patients less than 61 years old. Performed at [facility], [address] [place], [place] [address]     Radiology Studies: ECHOCARDIOGRAM COMPLETE  Result Date: 06/05/2022    ECHOCARDIOGRAM REPORT   Patient Name:   Glenn Olson Date of Exam: 06/05/2022 Medical Rec #:  [id number]      Height:       69.0 in Accession #:    [id number]     Weight:       153.0 lb Date of Birth:  07-11-1940      BSA:          1.844 m Patient Age:    81 years       BP:           116/84 mmHg Patient Gender: M              HR:           117 bpm. Exam Location:  Inpatient Procedure: 2D Echo,  Cardiac Doppler and Color Doppler Indications:    Atrial Fibrillation I48.91  History:        Patient has no prior history of Echocardiogram examinations.                 Signs/Symptoms:Chest Pain.  Sonographer:    Lucendia Herrlich Referring Phys: [id number] [facility] POK[facility]REL  Sonographer Comments: No apical window. IMPRESSIONS  1. Left ventricular ejection fraction, by estimation, is 55 to 60%. The left ventricle has normal function. The left ventricle has no regional wall motion abnormalities. There  is mild concentric left ventricular hypertrophy. Left ventricular diastolic function could not be evaluated.  2. Right ventricular systolic function is normal. The right ventricular size is normal. There is mildly elevated pulmonary artery systolic pressure. The estimated right ventricular systolic pressure is 44.2 mmHg.  3. Left atrial size was moderately dilated.  4. Right atrial size was mildly dilated.  5. The mitral valve is grossly normal. Trivial mitral valve regurgitation. No evidence of mitral stenosis.  6. The aortic valve is tricuspid. Aortic valve regurgitation is not visualized. No aortic stenosis is present.  7. The inferior vena cava is dilated in size with <50% respiratory variability, suggesting right atrial pressure of 15 mmHg. FINDINGS  Left Ventricle: Left ventricular ejection fraction, by estimation, is 55 to 60%. The left ventricle has normal function. The left ventricle has no regional wall motion abnormalities. The left ventricular internal cavity size was normal in size. There is  mild concentric left ventricular hypertrophy. Left ventricular diastolic function could not be evaluated due to atrial fibrillation. Left ventricular diastolic function could not be evaluated. Right Ventricle: The right ventricular size is normal. No increase in right ventricular wall thickness. Right ventricular systolic function is normal. There is mildly elevated pulmonary artery systolic pressure. The tricuspid regurgitant velocity is 2.70  m/s, and with an assumed right atrial pressure of 15 mmHg, the estimated right ventricular systolic pressure is 44.2 mmHg. Left Atrium: Left atrial size was moderately dilated. Right Atrium: Right atrial size was mildly dilated. Pericardium: Trivial pericardial effusion is present. Mitral Valve: The mitral valve is grossly normal. Trivial mitral valve regurgitation. No evidence of mitral valve stenosis. Tricuspid Valve: The tricuspid valve is grossly normal. Tricuspid valve  regurgitation is mild . No evidence of tricuspid stenosis. Aortic Valve: The aortic valve is tricuspid. Aortic valve regurgitation is not visualized. No aortic stenosis is present. Pulmonic Valve: The pulmonic valve was grossly normal. Pulmonic valve regurgitation is trivial. No evidence of pulmonic stenosis. Aorta: The aortic root and ascending aorta are structurally normal, with no evidence of dilitation. Venous: The inferior vena cava is dilated in size with less than 50% respiratory variability, suggesting right atrial pressure of 15 mmHg. IAS/Shunts: The atrial septum is grossly normal.  LEFT VENTRICLE PLAX 2D LVIDd:         4.50 cm LVIDs:         3.30 cm LV PW:         1.60 cm LV IVS:        1.20 cm LVOT diam:     2.10 cm LVOT Area:     3.46 cm  IVC IVC diam: 2.70 cm LEFT ATRIUM           Index LA diam:      5.40 cm 2.93 cm/m LA Vol (A4C): 85.0 ml 46.10 ml/m  PULMONIC VALVE AORTA                 PR End Diast Vel: 9.36 msec Ao Root diam: 3.70 cm Ao Asc diam:  3.20 cm MITRAL VALVE               TRICUSPID VALVE MV Area (PHT): 10.68 cm   TR Peak grad:   29.2 mmHg MV Decel Time: 71 msec     TR Vmax:        270.00 cm/s MV E velocity: 91.70 cm/s MV A velocity: 50.00 cm/s  SHUNTS MV E/A ratio:  1.83        Systemic Diam: 2.10 cm Lennie Odor MD Electronically signed by Lennie Odor MD Signature Date/Time: 06/05/2022/3:11:07 PM    Final       Adaline Trejos T. Josie [facility]  If 7PM-7AM, please contact night-coverage [email or web address] 06/06/2022, 12:21 PM

## 2022-06-06 NOTE — Progress Notes (Signed)
ANTICOAGULATION CONSULT NOTE  Pharmacy Consult for IV heparin Indication: atrial fibrillation  No Known Allergies  Patient Measurements: Height: 5\' 9"  (175.3 cm) Weight: 71 kg (156 lb 8.4 oz) IBW/kg (Calculated) : 70.7 Heparin Dosing Weight: ~ 70 kg  Vital Signs: Temp: 98.9 F (37.2 C) (05/08 2039) Temp Source: Oral (05/08 2039) BP: 127/65 (05/08 2039) Pulse Rate: 98 (05/08 2039)  Labs: Recent Labs    06/04/22 0414 06/05/22 0247 06/05/22 0251 06/06/22 0053 06/06/22 1023 06/06/22 2017  HGB 8.1* 7.9*  --  8.1*  --   --   HCT 24.4* 23.5*  --  25.8*  --   --   PLT 139* 145*  --  149*  --   --   HEPARINUNFRC  --   --   --  <0.10* <0.10* <0.10*  CREATININE 7.39*  --  8.21* 8.13*  --   --      Estimated Creatinine Clearance: 7.2 mL/min (A) (by C-G formula based on SCr of 8.13 mg/dL (H)).   Medical History: Past Medical History:  Diagnosis Date   Anemia    Chronic kidney disease    Dysrhythmia    GERD (gastroesophageal reflux disease)    Neurogenic bladder     Medications:  Infusions:   ferric gluconate (FERRLECIT) IVPB 110 mL/hr at 06/06/22 1828   heparin 1,250 Units/hr (06/06/22 1828)    Assessment: 82 yo male with ESRD and new afib.  Pharmacy asked to start anticoagulation with IV heparin.   Heparin level came back undetectable on 1100 units/hr. Hgb 8.1, plt 149. No s/sx of bleeding or infusion issues - given HD restricted access but drawn appropriately to lab protocol.   Heparin level still came back undetectable. Rn said only held for 10 minutes for collection. We will increase rate and check in AM.   Goal of Therapy:  Heparin level 0.3-0.7 units/ml Monitor platelets by anticoagulation protocol: Yes   Plan:  Increase heparin infusion to 1450 units/hr Order heparin level in Am Monitor daily HL, CBC, and for s/sx of bleeding   Ulyses Southward, PharmD, BCIDP, AAHIVP, CPP Infectious Disease Pharmacist 06/06/2022 9:55 PM

## 2022-06-06 NOTE — Progress Notes (Addendum)
Rounding Note    Patient Name: Glenn Olson Date of Encounter: 06/06/2022  [facility] Cardiologist: None   Subjective   Laying in bed, offers no complaints.   Inpatient Medications    Scheduled Meds:  sodium chloride   Intravenous Once   calcitRIOL  0.5 mcg Oral Daily   calcium acetate  1,334 mg Oral TID WC   Chlorhexidine Gluconate Cloth  6 each Topical Q0600   darbepoetin (ARANESP) injection - DIALYSIS  40 mcg Subcutaneous Q Fri-1800   diltiazem  30 mg Oral Q6H   loratadine  10 mg Oral Daily   Continuous Infusions:  ferric gluconate (FERRLECIT) IVPB Stopped (06/04/22 1513)   heparin 1,100 Units/hr (06/06/22 0237)   PRN Meds: acetaminophen **OR** acetaminophen, HYDROmorphone (DILAUDID) injection, labetalol, ondansetron (ZOFRAN) IV, polyethylene glycol   Vital Signs    Vitals:   06/05/22 1958 06/06/22 0031 06/06/22 0536 06/06/22 0733  BP: 122/64 (!) 147/109 136/88 (!) 117/96  Pulse: (!) 102 (!) 158 73 (!) 108  Resp: 16  16 16   Temp: 98.8 F (37.1 C)   (!) 97.5 F (36.4 C)  TempSrc:    Oral  SpO2: 93%  99% 94%  Weight:      Height:        Intake/Output Summary (Last 24 hours) at 06/06/2022 0948 Last data filed at 06/06/2022 0458 Gross per 24 hour  Intake 153.12 ml  Output 725 ml  Net -571.88 ml      06/02/2022   10:57 AM 06/02/2022    8:18 AM 05/30/2022    2:36 PM  Last 3 Weights  Weight (lbs) 153 lb 151 lb 10.8 oz 177 lb 0.5 oz  Weight (kg) 69.4 kg 68.8 kg 80.3 kg      Telemetry    Atrial flutter, PVC rates 100-120 - Personally Reviewed  ECG    No new tracing  Physical Exam   GEN: No acute distress.   Neck: No JVD Cardiac: Tachy, no murmurs, rubs, or gallops.  Respiratory: Clear to auscultation bilaterally. GI: Soft, nontender, non-distended  MS: No edema; No deformity. Neuro:  Nonfocal  Psych: Normal affect   Labs    High Sensitivity Troponin:   Recent Labs  Lab 05/30/22 1445 05/30/22 1640  TROPONINIHS 48* 52*      Chemistry Recent Labs  Lab 05/30/22 1445 05/31/22 0723 06/02/22 0411 06/03/22 0248 06/04/22 0414 06/05/22 0247 06/05/22 0251 06/06/22 0053  NA 134*   < > 137   < > 138  --  140 135  K 4.5   < > 3.1*   < > 2.4*  --  3.1* 3.8  CL 104   < > 94*   < > 91*  --  94* 94*  CO2 <7*   < > 17*   < > 27  --  27 24  GLUCOSE 137*   < > 77   < > 91  --  96 127*  BUN 241*   < > 143*   < > 79*  --  81* 87*  CREATININE 13.80*   < > 9.38*   < > 7.39*  --  8.21* 8.13*  CALCIUM 6.0*   < > 5.6*   < > 6.2*  --  6.6* 6.9*  MG  --   --  1.3*   < > 1.7 1.6*  --  2.1  PROT 7.9  --   --   --   --   --   --   --  ALBUMIN 4.1   < > 3.2*  --   --   --  2.7* 3.0*  AST 12*  --   --   --   --   --   --   --   ALT 16  --   --   --   --   --   --   --   ALKPHOS 103  --   --   --   --   --   --   --   BILITOT 0.7  --   --   --   --   --   --   --   GFRNONAA 3*   < > 5*   < > 7*  --  6* 6*  ANIONGAP NOT CALCULATED   < > 26*   < > 20*  --  19* 17*   < > = values in this interval not displayed.    Lipids No results for input(s): "CHOL", "TRIG", "HDL", "LABVLDL", "LDLCALC", "CHOLHDL" in the last 168 hours.  Hematology Recent Labs  Lab 06/04/22 0414 06/05/22 0247 06/06/22 0053  WBC 8.5 8.6 9.2  RBC 2.72* 2.61* 2.80*  HGB 8.1* 7.9* 8.1*  HCT 24.4* 23.5* 25.8*  MCV 89.7 90.0 92.1  MCH 29.8 30.3 28.9  MCHC 33.2 33.6 31.4  RDW 15.3 15.0 14.8  PLT 139* 145* 149*   Thyroid  Recent Labs  Lab 06/05/22 0251  TSH 1.494    BNPNo results for input(s): "BNP", "PROBNP" in the last 168 hours.  DDimer No results for input(s): "DDIMER" in the last 168 hours.   Radiology    ECHOCARDIOGRAM COMPLETE  Result Date: 06/05/2022    ECHOCARDIOGRAM REPORT   Patient Name:   Glenn Olson Date of Exam: 06/05/2022 Medical Rec #:  [id number]      Height:       69.0 in Accession #:    [id number]     Weight:       153.0 lb Date of Birth:  January 04, 1941      BSA:          1.844 m Patient Age:    82 years       BP:           116/84  mmHg Patient Gender: M              HR:           117 bpm. Exam Location:  Inpatient Procedure: 2D Echo, Cardiac Doppler and Color Doppler Indications:    Atrial Fibrillation I48.91  History:        Patient has no prior history of Echocardiogram examinations.                 Signs/Symptoms:Chest Pain.  Sonographer:    Lucendia Herrlich Referring Phys: [id number] [facility] [place]  Sonographer Comments: No apical window. IMPRESSIONS  1. Left ventricular ejection fraction, by estimation, is 55 to 60%. The left ventricle has normal function. The left ventricle has no regional wall motion abnormalities. There is mild concentric left ventricular hypertrophy. Left ventricular diastolic function could not be evaluated.  2. Right ventricular systolic function is normal. The right ventricular size is normal. There is mildly elevated pulmonary artery systolic pressure. The estimated right ventricular systolic pressure is 44.2 mmHg.  3. Left atrial size was moderately dilated.  4. Right atrial size was mildly dilated.  5. The mitral valve is grossly normal. Trivial mitral valve regurgitation. No evidence of mitral  stenosis.  6. The aortic valve is tricuspid. Aortic valve regurgitation is not visualized. No aortic stenosis is present.  7. The inferior vena cava is dilated in size with <50% respiratory variability, suggesting right atrial pressure of 15 mmHg. FINDINGS  Left Ventricle: Left ventricular ejection fraction, by estimation, is 55 to 60%. The left ventricle has normal function. The left ventricle has no regional wall motion abnormalities. The left ventricular internal cavity size was normal in size. There is  mild concentric left ventricular hypertrophy. Left ventricular diastolic function could not be evaluated due to atrial fibrillation. Left ventricular diastolic function could not be evaluated. Right Ventricle: The right ventricular size is normal. No increase in right ventricular wall thickness. Right ventricular  systolic function is normal. There is mildly elevated pulmonary artery systolic pressure. The tricuspid regurgitant velocity is 2.70  m/s, and with an assumed right atrial pressure of 15 mmHg, the estimated right ventricular systolic pressure is 44.2 mmHg. Left Atrium: Left atrial size was moderately dilated. Right Atrium: Right atrial size was mildly dilated. Pericardium: Trivial pericardial effusion is present. Mitral Valve: The mitral valve is grossly normal. Trivial mitral valve regurgitation. No evidence of mitral valve stenosis. Tricuspid Valve: The tricuspid valve is grossly normal. Tricuspid valve regurgitation is mild . No evidence of tricuspid stenosis. Aortic Valve: The aortic valve is tricuspid. Aortic valve regurgitation is not visualized. No aortic stenosis is present. Pulmonic Valve: The pulmonic valve was grossly normal. Pulmonic valve regurgitation is trivial. No evidence of pulmonic stenosis. Aorta: The aortic root and ascending aorta are structurally normal, with no evidence of dilitation. Venous: The inferior vena cava is dilated in size with less than 50% respiratory variability, suggesting right atrial pressure of 15 mmHg. IAS/Shunts: The atrial septum is grossly normal.  LEFT VENTRICLE PLAX 2D LVIDd:         4.50 cm LVIDs:         3.30 cm LV PW:         1.60 cm LV IVS:        1.20 cm LVOT diam:     2.10 cm LVOT Area:     3.46 cm  IVC IVC diam: 2.70 cm LEFT ATRIUM           Index LA diam:      5.40 cm 2.93 cm/m LA Vol (A4C): 85.0 ml 46.10 ml/m                        PULMONIC VALVE AORTA                 PR End Diast Vel: 9.36 msec Ao Root diam: 3.70 cm Ao Asc diam:  3.20 cm MITRAL VALVE               TRICUSPID VALVE MV Area (PHT): 10.68 cm   TR Peak grad:   29.2 mmHg MV Decel Time: 71 msec     TR Vmax:        270.00 cm/s MV E velocity: 91.70 cm/s MV A velocity: 50.00 cm/s  SHUNTS MV E/A ratio:  1.83        Systemic Diam: 2.10 cm Lennie Odor MD Electronically signed by Lennie Odor MD  Signature Date/Time: 06/05/2022/3:11:07 PM    Final     Cardiac Studies   Echo: 06/05/2022      ECHOCARDIOGRAM REPORT       Patient Name:   Glenn Olson Date of Exam: 06/05/2022 Medical Rec #:  [id number]      Height:       69.0 in Accession #:    [id number]     Weight:       153.0 lb Date of Birth:  [birth year]      BSA:          1.844 m Patient Age:    82 years       BP:           116/84 mmHg Patient Gender: M              HR:           117 bpm. Exam Location:  Inpatient  Procedure: 2D Echo, Cardiac Doppler and Color Doppler  Indications:    Atrial Fibrillation I48.91   History:        Patient has no prior history of Echocardiogram examinations.                 Signs/Symptoms:Chest Pain.   Sonographer:    Lucendia Herrlich Referring Phys: [id number] [facility] [place]    Sonographer Comments: No apical window. IMPRESSIONS    1. Left ventricular ejection fraction, by estimation, is 55 to 60%. The left ventricle has normal function. The left ventricle has no regional wall motion abnormalities. There is mild concentric left ventricular hypertrophy. Left ventricular diastolic function could not be evaluated.  2. Right ventricular systolic function is normal. The right ventricular size is normal. There is mildly elevated pulmonary artery systolic pressure. The estimated right ventricular systolic pressure is 44.2 mmHg.  3. Left atrial size was moderately dilated.  4. Right atrial size was mildly dilated.  5. The mitral valve is grossly normal. Trivial mitral valve regurgitation. No evidence of mitral stenosis.  6. The aortic valve is tricuspid. Aortic valve regurgitation is not visualized. No aortic stenosis is present.  7. The inferior vena cava is dilated in size with <50% respiratory variability, suggesting right atrial pressure of 15 mmHg.  FINDINGS  Left Ventricle: Left ventricular ejection fraction, by estimation, is 55 to 60%. The left ventricle has normal function. The  left ventricle has no regional wall motion abnormalities. The left ventricular internal cavity size was normal in size. There is  mild concentric left ventricular hypertrophy. Left ventricular diastolic function could not be evaluated due to atrial fibrillation. Left ventricular diastolic function could not be evaluated.  Right Ventricle: The right ventricular size is normal. No increase in right ventricular wall thickness. Right ventricular systolic function is normal. There is mildly elevated pulmonary artery systolic pressure. The tricuspid regurgitant velocity is 2.70  m/s, and with an assumed right atrial pressure of 15 mmHg, the estimated right ventricular systolic pressure is 44.2 mmHg.  Left Atrium: Left atrial size was moderately dilated.  Right Atrium: Right atrial size was mildly dilated.  Pericardium: Trivial pericardial effusion is present.  Mitral Valve: The mitral valve is grossly normal. Trivial mitral valve regurgitation. No evidence of mitral valve stenosis.  Tricuspid Valve: The tricuspid valve is grossly normal. Tricuspid valve regurgitation is mild . No evidence of tricuspid stenosis.  Aortic Valve: The aortic valve is tricuspid. Aortic valve regurgitation is not visualized. No aortic stenosis is present.  Pulmonic Valve: The pulmonic valve was grossly normal. Pulmonic valve regurgitation is trivial. No evidence of pulmonic stenosis.  Aorta: The aortic root and ascending aorta are structurally normal, with no evidence of dilitation.  Venous: The inferior vena cava is dilated in size with less than 50% respiratory variability, suggesting right atrial  pressure of 15 mmHg.  IAS/Shunts: The atrial septum is grossly normal.    Patient Profile     82 y.o. male with a hx of end-stage renal disease (has not attended dialysis in over a year), neurogenic atonic bladder, who is being seen 06/05/2022 for the evaluation of new onset A-fib/flutter with RVR at the  request of Dr Tyson Babinski.   Assessment & Plan    New onset Atrial flutter -- Patient admitted for uremia secondary to stopping HD with end-stage renal disease. Found to be in new onset atrial flutter. Was initially given a 1x dose of IV cardizem. 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  Agree with above as stated.    Continue with plans for rate control with diltiazem.  If no bleeding issues and hemoglobin stable, can transition to Eliquis once invasive procedures are completed.  Occasional higher blood pressure readings.  Given that he is getting back on dialysis, will hold diltiazem dose at diltiazem CD 120 mg daily.  This could be increased if his heart rate increases and blood pressure will tolerate.  Hopefully as volume status improves, rate control will improve.  Lance Muss

## 2022-06-06 NOTE — Progress Notes (Signed)
POST HD TX NOTE  06/06/22 1745  Vitals  Temp 98 F (36.7 C)  Temp Source Oral  BP (!) 146/75  MAP (mmHg) 96  BP Location Left Leg  BP Method Automatic  Patient Position (if appropriate) Lying  Pulse Rate 77  Pulse Rate Source Monitor  ECG Heart Rate (!) 114  Resp (!) 21  Oxygen Therapy  SpO2 97 %  O2 Device Room Air  Pulse Oximetry Type Continuous  During Treatment Monitoring  Intra-Hemodialysis Comments (S)   (post HD tx VS check)  Post Treatment  Dialyzer Clearance Lightly streaked  Duration of HD Treatment -hour(s) 3.5 hour(s)  Hemodialysis Intake (mL) 110 mL (Ferric gluconate)  Liters Processed 63  Fluid Removed (mL) 1090 mL ( (value from machine) - (ferric gluconate volume) = )  Tolerated HD Treatment Yes  Post-Hemodialysis Comments (S)  tx completed w/o prolbem, UF goal met, blood rinsed back, VSS. Medication Admin: Ferric Gluconate 125mg  IVBP, Heparin Dwells 3200 units  Hemodialysis Catheter Right Internal jugular Double lumen Permanent (Tunneled)  Placement Date/Time: 05/31/22 1223   Placed prior to admission: No  Serial / Lot #: [id number]  Expiration Date: 12/05/26  Time Out: Correct patient;Correct site;Correct procedure  Maximum sterile barrier precautions: Hand hygiene;Cap;Mask;Sterile gow...  Site Condition No complications  Blue Lumen Status Heparin locked;Dead end cap in place  Red Lumen Status Heparin locked;Dead end cap in place  Purple Lumen Status N/A  Catheter fill solution Heparin 1000 units/ml  Catheter fill volume (Arterial) 1.6 cc  Catheter fill volume (Venous) 1.6  Dressing Type Transparent  Dressing Status Antimicrobial disc in place;Clean, Dry, Intact  Drainage Description None  Dressing Change Due 06/08/22  Post treatment catheter status Capped and Clamped

## 2022-06-07 DIAGNOSIS — N19 Unspecified kidney failure: Secondary | ICD-10-CM | POA: Diagnosis not present

## 2022-06-07 DIAGNOSIS — E876 Hypokalemia: Secondary | ICD-10-CM | POA: Diagnosis not present

## 2022-06-07 DIAGNOSIS — N186 End stage renal disease: Secondary | ICD-10-CM | POA: Diagnosis not present

## 2022-06-07 DIAGNOSIS — Z978 Presence of other specified devices: Secondary | ICD-10-CM | POA: Diagnosis not present

## 2022-06-07 DIAGNOSIS — I483 Typical atrial flutter: Secondary | ICD-10-CM | POA: Diagnosis not present

## 2022-06-07 DIAGNOSIS — Z992 Dependence on renal dialysis: Secondary | ICD-10-CM | POA: Diagnosis not present

## 2022-06-07 LAB — CBC
HCT: 24.4 % — ABNORMAL LOW (ref 39.0–52.0)
Hemoglobin: 7.9 g/dL — ABNORMAL LOW (ref 13.0–17.0)
MCH: 29.8 pg (ref 26.0–34.0)
MCHC: 32.4 g/dL (ref 30.0–36.0)
MCV: 92.1 fL (ref 80.0–100.0)
Platelets: 132 10*3/uL — ABNORMAL LOW (ref 150–400)
RBC: 2.65 MIL/uL — ABNORMAL LOW (ref 4.22–5.81)
RDW: 14.6 % (ref 11.5–15.5)
WBC: 7.8 10*3/uL (ref 4.0–10.5)
nRBC: 0 % (ref 0.0–0.2)

## 2022-06-07 LAB — RENAL FUNCTION PANEL
Albumin: 2.9 g/dL — ABNORMAL LOW (ref 3.5–5.0)
Anion gap: 12 (ref 5–15)
BUN: 38 mg/dL — ABNORMAL HIGH (ref 8–23)
CO2: 24 mmol/L (ref 22–32)
Calcium: 7.3 mg/dL — ABNORMAL LOW (ref 8.9–10.3)
Chloride: 98 mmol/L (ref 98–111)
Creatinine, Ser: 4.94 mg/dL — ABNORMAL HIGH (ref 0.61–1.24)
GFR, Estimated: 11 mL/min — ABNORMAL LOW (ref >60)
Glucose, Bld: 106 mg/dL — ABNORMAL HIGH (ref 70–99)
Phosphorus: 3.1 mg/dL (ref 2.5–4.6)
Potassium: 3.7 mmol/L (ref 3.5–5.1)
Sodium: 134 mmol/L — ABNORMAL LOW (ref 135–145)

## 2022-06-07 LAB — HEPARIN LEVEL (UNFRACTIONATED)
Heparin Unfractionated: <0.1 IU/mL — ABNORMAL LOW (ref 0.30–0.70)
Heparin Unfractionated: <0.1 IU/mL — ABNORMAL LOW (ref 0.30–0.70)

## 2022-06-07 LAB — APTT: aPTT: 81 seconds — ABNORMAL HIGH (ref 24–36)

## 2022-06-07 LAB — MAGNESIUM: Magnesium: 1.5 mg/dL — ABNORMAL LOW (ref 1.7–2.4)

## 2022-06-07 MED ORDER — DILTIAZEM HCL 25 MG/5ML IV SOLN
15.0000 mg | Freq: Once | INTRAVENOUS | Status: AC
  Administered 2022-06-07: 15 mg via INTRAVENOUS
  Filled 2022-06-07: qty 5

## 2022-06-07 MED ORDER — DILTIAZEM HCL ER COATED BEADS 120 MG PO CP24
120.0000 mg | ORAL_CAPSULE | Freq: Every day | ORAL | Status: DC
  Administered 2022-06-08: 120 mg via ORAL
  Filled 2022-06-07 (×2): qty 1

## 2022-06-07 MED ORDER — DILTIAZEM HCL 30 MG PO TABS: 30.0000 mg | ORAL_TABLET | Freq: Once | ORAL | Status: DC

## 2022-06-07 MED ORDER — MELATONIN 3 MG PO TABS
3.0000 mg | ORAL_TABLET | Freq: Every evening | ORAL | Status: DC | PRN
  Administered 2022-06-07 – 2022-06-08 (×2): 3 mg via ORAL
  Filled 2022-06-07 (×2): qty 1

## 2022-06-07 MED ORDER — HEPARIN BOLUS VIA INFUSION
2500.0000 [IU] | Freq: Once | INTRAVENOUS | Status: AC
  Administered 2022-06-07: 2500 [IU] via INTRAVENOUS
  Filled 2022-06-07: qty 2500

## 2022-06-07 MED ORDER — MAGNESIUM SULFATE IN D5W 1-5 GM/100ML-% IV SOLN
1.0000 g | Freq: Once | INTRAVENOUS | Status: AC
  Administered 2022-06-07: 1 g via INTRAVENOUS
  Filled 2022-06-07: qty 100

## 2022-06-07 MED ORDER — NEPRO/CARBSTEADY PO LIQD
237.0000 mL | Freq: Two times a day (BID) | ORAL | Status: DC
  Administered 2022-06-07 – 2022-06-19 (×4): 237 mL via ORAL

## 2022-06-07 NOTE — Progress Notes (Addendum)
ANTICOAGULATION CONSULT NOTE  Pharmacy Consult for IV heparin Indication: atrial fibrillation  No Known Allergies  Patient Measurements: Height: 5\' 9"  (175.3 cm) Weight: 71 kg (156 lb 8.4 oz) IBW/kg (Calculated) : 70.7 Heparin Dosing Weight: ~ 70 kg  Vital Signs: Temp: 97.5 F (36.4 C) (05/09 1131) Temp Source: Oral (05/09 1131) BP: 101/87 (05/09 1823) Pulse Rate: 67 (05/09 1823)  Labs: Recent Labs    06/05/22 0247 06/05/22 0251 06/06/22 0053 06/06/22 1023 06/06/22 2017 06/07/22 0804 06/07/22 0809 06/07/22 1756  HGB 7.9*  --  8.1*  --   --   --  7.9*  --   HCT 23.5*  --  25.8*  --   --   --  24.4*  --   PLT 145*  --  149*  --   --   --  132*  --   APTT  --   --   --   --   --  81*  --   --   HEPARINUNFRC  --   --  <0.10*   < > <0.10*  --  <0.10* <0.10*  CREATININE  --  8.21* 8.13*  --   --   --  4.94*  --    < > = values in this interval not displayed.     Estimated Creatinine Clearance: 11.9 mL/min (A) (by C-G formula based on SCr of 4.94 mg/dL (H)).   Medical History: Past Medical History:  Diagnosis Date   Anemia    Chronic kidney disease    Dysrhythmia    GERD (gastroesophageal reflux disease)    Neurogenic bladder     Medications:  Infusions:   ferric gluconate (FERRLECIT) IVPB 110 mL/hr at 06/06/22 1828   heparin 1,700 Units/hr (06/07/22 1031)    Assessment: 82 yo male with ESRD and new afib.  Pharmacy asked to start anticoagulation with IV heparin.   Heparin level came back undetectable this PM   Goal of Therapy:  Heparin level 0.3-0.7 units/ml Monitor platelets by anticoagulation protocol: Yes   Plan:  Heparin to 1900 units / hr Monitor daily HL, CBC, and for s/sx of bleeding   Thank you. Okey Regal, PharmD Please utilize Amion for appropriate phone number to reach the unit pharmacist [facility])  06/07/2022 7:07 PM

## 2022-06-07 NOTE — Progress Notes (Signed)
Initial Nutrition Assessment  DOCUMENTATION CODES:   Not applicable  INTERVENTION:  - Liberalize to Regular diet with 1200 mL fluid restriction.   - Add Nepro Shake po BID, each supplement provides 425 kcal and 19 grams protein  NUTRITION DIAGNOSIS:   Inadequate oral intake related to poor appetite as evidenced by meal completion < 25%.  GOAL:   Patient will meet greater than or equal to 90% of their needs  MONITOR:   PO intake, Supplement acceptance  REASON FOR ASSESSMENT:   Consult Diet education  ASSESSMENT:   82 y.o. male admits related to chest pain and headache. PMH includes: anemia, CKD, dysrhythmia, GERD, neurogenic bladder. Pt is currently receiving medical management related to uremia.  Meds reviewed: calcitriol, phoslo. Labs reviewed: Na low, BUN/Creatinine low.   Consult for diet education. However, pt is oriented x2 and with poor PO intakes, making him not appropriate for education at this time. Per record, PO intakes have been 5-10% of meals since admission. RD will liberalize pt to a Regular diet with fluid restriction. Will also add Nepro shakes BID for now. Will continue to monitor PO intakes.   NUTRITION - FOCUSED PHYSICAL EXAM:  Working remotely, will attempt at f/u.   Diet Order:   Diet Order             Diet regular Room service appropriate? Yes with Assist; Fluid consistency: Thin; Fluid restriction: 1200 mL Fluid  Diet effective now                   EDUCATION NEEDS:   Not appropriate for education at this time  Skin:  Skin Assessment: Skin Integrity Issues: Skin Integrity Issues:: Incisions Incisions: right neck  Last BM:  5/7  Height:   Ht Readings from Last 1 Encounters:  05/30/22 5\' 9"  (1.753 m)    Weight:   Wt Readings from Last 1 Encounters:  06/06/22 71 kg    Ideal Body Weight:     BMI:  Body mass index is 23.11 kg/m.  Estimated Nutritional Needs:   Kcal:  3329-5188 kcals  Protein:  105-125 gm  Fluid:   </= 1.2 L  Bethann Humble, RD, LDN, CNSC.

## 2022-06-07 NOTE — Progress Notes (Signed)
Patient has been sustaining a heart rate of 166 since 0500. Dr. Alanda Slim made aware. Scheduled PO diltiazem given early per MD order.

## 2022-06-07 NOTE — Progress Notes (Signed)
Admit: 05/30/2022 LOS: 8  Glenn Olson is an 82 year old male who presented with symptoms of fatigue, headache, chest pain in setting of quitting HD quit about 14 months ago because he did not like going and stopped all of his medications. HD catheter was placed by [facility] 5/2 and patient has resumed HD.   Subjective:  Patient is sitting up in bed, alert, states he is feeling okay this morning, still fatigued but denies any chest pain, shortness of breath, dizziness.  05/08 0701 - 05/09 0700 In: 442.1 [P.O.:60; I.V.:272.1; IV Piggyback:110] Out: 1740 [Urine:650]  Filed Weights   06/02/22 1057 06/06/22 1356 06/06/22 1745  Weight: 69.4 kg 72.2 kg 71 kg    Scheduled Meds:  sodium chloride   Intravenous Once   calcitRIOL  0.5 mcg Oral Daily   calcium acetate  1,334 mg Oral TID WC   Chlorhexidine Gluconate Cloth  6 each Topical Q0600   darbepoetin (ARANESP) injection - DIALYSIS  40 mcg Subcutaneous Q Fri-1800   diltiazem  120 mg Oral Daily   loratadine  10 mg Oral Daily   Continuous Infusions:  ferric gluconate (FERRLECIT) IVPB 110 mL/hr at 06/06/22 1828   heparin 1,450 Units/hr (06/06/22 2236)   PRN Meds:.acetaminophen **OR** acetaminophen, HYDROmorphone (DILAUDID) injection, labetalol, melatonin, ondansetron (ZOFRAN) IV, polyethylene glycol  Current Labs: reviewed    Physical Exam:  Blood pressure (!) 120/95, pulse (!) 52, temperature 97.8 F (36.6 C), temperature source Oral, resp. rate (!) 22, height 5\' 9"  (1.753 m), weight 71 kg, SpO2 99 %. General: Frail-appearing 82 year old male, NAD Cardio: Tachycardic, regular rhythm Lungs: Mild expiratory wheeze throughout, normal effort Abdomen: Soft, nontender palpation, nondistended Extremities: No edema of BLEs   A ESRD: HD catheter placed 5/2, HD session on 5/2 cut short to 30 minutes due to patient agitation.  HD 06/01/22 cut short 15 min due to machine error and tolerated HD well on 06/02/2022-no sitter needed. Did not have HD 5/6 or  5/7 d/t HR in 130's and hypokalemia. HD resumed yesterday (5/8) for 3.5 hrs and tolerated well.  Patient most alert I have seen him since admission. A fib w/ RVR - new this admission. Cardiology is on board and started po dilt and heparin 5/7 with plans to switch to eliquis. Echo showed EF 55-60% Anemia of CKD V: Hgb stable at 7.9 today. today. S/p I Unit RBCs on 5/3. 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 Phosphorous 9.3 (5 days ago) >3.1 today with phoslo and HD. Ca 5.5 (5 days ago) >7.3 today s/p calcium gluconate and calcitriol daily Neurogenic bladder Hypokalemia - K 3.7 today  Hypomagnesemia - 2.1  yesterday, mag today pending Metabolic acidosis resolved, bicarb 24 today   P ESRD: Patient is stable from a nephrology standpoint.  Will plan to continue HD in hospital while he is here for other issues and will need outpatient arrangements at [facility] when stable for discharge. Vascular access - he has a functioning AVG in his left forearm, as well as a RIJ TDC.   He does not want to use AVG at this time.  Will continue with [facility] for now.  Will address this in the future if he continues to tolerate HD.   A fib w/ RVR - management per cardiology Anemia of CKD V:. CTM hgb. Started Aranesp 40 mcg weekly.  Transfuse prn with transfusion threshold hgb <7.  IV iron w/ HD CKD-BMD - Continue PhosLo and calcitriol.  Will  follow phosphorus and calcium levels Neurogenic bladder-suprapubic catheter in place Hypokalemia-CTM Hypomagnesemia-CTM Metabolic acidosis, bicarb improved CTM Medication Issues; Preferred narcotic agents for pain control are hydromorphone, fentanyl, and methadone. Morphine should not be used.  Baclofen should be avoided Avoid oral sodium phosphate and magnesium citrate based laxatives / bowel preps   Erick Alley, DO Family Medicine, PGY-2  Recent Labs  Lab 06/02/22 0411 06/03/22 0248 06/04/22 0414 06/05/22 0251 06/06/22 0053  NA 137   <  > 138 140 135  K 3.1*   < > 2.4* 3.1* 3.8  CL 94*   < > 91* 94* 94*  CO2 17*   < > 27 27 24   GLUCOSE 77   < > 91 96 127*  BUN 143*   < > 79* 81* 87*  CREATININE 9.38*   < > 7.39* 8.21* 8.13*  CALCIUM 5.6*   < > 6.2* 6.6* 6.9*  PHOS 7.4*  --   --  5.8* 4.8*   < > = values in this interval not displayed.   Recent Labs  Lab 06/04/22 0414 06/05/22 0247 06/06/22 0053  WBC 8.5 8.6 9.2  HGB 8.1* 7.9* 8.1*  HCT 24.4* 23.5* 25.8*  MCV 89.7 90.0 92.1  PLT 139* 145* 149*

## 2022-06-07 NOTE — Progress Notes (Signed)
ANTICOAGULATION CONSULT NOTE  Pharmacy Consult for IV heparin Indication: atrial fibrillation  No Known Allergies  Patient Measurements: Height: 5\' 9"  (175.3 cm) Weight: 71 kg (156 lb 8.4 oz) IBW/kg (Calculated) : 70.7 Heparin Dosing Weight: ~ 70 kg  Vital Signs: Temp: 97.7 F (36.5 C) (05/09 0834) Temp Source: Oral (05/09 0834) BP: 117/88 (05/09 0834) Pulse Rate: 169 (05/09 0834)  Labs: Recent Labs    06/05/22 0247 06/05/22 0247 06/05/22 0251 06/06/22 0053 06/06/22 1023 06/06/22 2017 06/07/22 0809  HGB 7.9*  --   --  8.1*  --   --  7.9*  HCT 23.5*  --   --  25.8*  --   --  24.4*  PLT 145*  --   --  149*  --   --  132*  HEPARINUNFRC  --    < >  --  <0.10* <0.10* <0.10* <0.10*  CREATININE  --   --  8.21* 8.13*  --   --  4.94*   < > = values in this interval not displayed.     Estimated Creatinine Clearance: 11.9 mL/min (A) (by C-G formula based on SCr of 4.94 mg/dL (H)).   Medical History: Past Medical History:  Diagnosis Date  . Anemia   . Chronic kidney disease   . Dysrhythmia   . GERD (gastroesophageal reflux disease)   . Neurogenic bladder     Medications:  Infusions:  . ferric gluconate (FERRLECIT) IVPB 110 mL/hr at 06/06/22 1828  . heparin 1,450 Units/hr (06/07/22 0735)    Assessment: 82 yo male with ESRD and new afib.  Pharmacy asked to start anticoagulation with IV heparin.   Heparin level came back undetectable on 1450 units/hr. Hgb 8.1>>7.9, plt 132. No s/sx of bleeding or infusion issues - given HD restricted access but drawn appropriately to lab protocol.     Goal of Therapy:  Heparin level 0.3-0.7 units/ml Monitor platelets by anticoagulation protocol: Yes   Plan:  Bolus 2500 units Iv x 1 Increase heparin infusion to 1700 units/hr Heparin level in 8 hours Monitor daily HL, CBC, and for s/sx of bleeding   Maximo Spratling A. Jeanella Craze, PharmD, BCPS, FNKF Clinical Pharmacist [place] Please utilize Amion for appropriate phone number to  reach the unit pharmacist [facility])  06/07/2022 9:36 AM

## 2022-06-07 NOTE — Progress Notes (Signed)
Inpatient Rehab Coordinator Note:  I met with patient at bedside and was able to f/u with his son, Alfonse Flavors. On the phone, to discuss CIR recommendations and goals/expectations of CIR stay.  We reviewed 3 hrs/day of therapy, physician follow up, and average length of stay 2 weeks (dependent upon progress) with goals of supervision.  We discussed medicare benefits and transition to rehab once medically stable and cleared by physicians.  We discussed likely recommendations for discharge from CIR would be 24/7 supervision and he reports that between himself, his wife, and hired caregivers they would make that work. I will continue to follow  Estill Dooms, PT, DPT Admissions Coordinator [phone] 06/07/22  1:05 PM

## 2022-06-07 NOTE — Progress Notes (Signed)
Pt has been accepted at [facility] on TTS 6:10 am chair time. This has not been discussed with family as of yet due to this may change pending d/c plans. Will follow and assist with coordination of out-pt HD based on d/c plans.   Olivia Canter Renal Navigator [phone]

## 2022-06-07 NOTE — Progress Notes (Signed)
Report called to [facility] on 4E

## 2022-06-07 NOTE — Progress Notes (Signed)
PROGRESS NOTE  Glenn Olson HQI:[id number] DOB: [birth year]   PCP: Benita Stabile, MD  Patient is from: Home  DOA: 05/30/2022 LOS: 8  Chief complaints Chief Complaint  Patient presents with   Headache     Brief Narrative / Interim history: 83 y.o. male with medical history significant for ESRD, neurogenic atonic bladder presented to [facility] with headache and chest pain for 1 week.  Patient was on hemodialysis but had stopped going for hemodialysis for 1 year now.  Has been having poor oral intake with nausea and vomiting.  In the ED, patient was mildly tachypneic and tachycardic with elevated blood pressure.  Chest x-ray with increased left lower lower lobe markings. CO2 less than 7.  BUN of 241.  Potassium 4.5.  Cr 13.8.Marland KitchenTroponin 48 > 52.  Nephrology was consulted and decision was to proceed with hemodialysis.  Patient was then transferred to [facility].   Patient has had HD cath placed and started on hemodialysis.  Encephalopathy improving.  Hospital course complicated by A-fib with RVR.  Cardiology consulted.  He was started on p.o. Cardizem and IV heparin.  TTE without significant finding.  Cardiology following.  Therapy recommended CIR.  CIR following.    Subjective: Seen and examined earlier this morning.  Patient went into RVR with HR in the 160s to 170s earlier this morning.  Blood pressure stable.  HR improved to 110s after IV Cardizem push.  Patient felt some palpitation and dizziness but sitting in bed and eating breakfast while HR is in 170s.  Does not appear to be in distress.  Cardiology notified.   Objective: Vitals:   06/06/22 1841 06/06/22 2039 06/07/22 0437 06/07/22 0834  BP: (!) 127/92 127/65 (!) 120/95 117/88  Pulse: (!) 108 98 (!) 52 (!) 169  Resp: 17  (!) 22 18  Temp: 98.2 F (36.8 C) 98.9 F (37.2 C) 97.8 F (36.6 C) 97.7 F (36.5 C)  TempSrc: Oral Oral Oral Oral  SpO2: 94% 93% 99% 98%  Weight:      Height:         Examination:  GENERAL: Sitting in bed in breakfast. HEENT: MMM.  Vision and hearing grossly intact.  NECK: Supple.  No apparent JVD.  RESP:  No IWOB.  Fair aeration bilaterally. CVS: Tachycardic to 160s.  Heart sounds normal.  ABD/GI/GU: BS+. Abd soft, NTND.  Suprapubic catheter. MSK/EXT:   No apparent deformity. Moves extremities. No edema.  SKIN: no apparent skin lesion or wound NEURO: Awake and alert. Oriented fairly.  No apparent focal neuro deficit. PSYCH: Calm. Normal affect.   Procedures:  HD catheter placed  Microbiology summarized: MRSA PCR screen nonreactive.  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 Has had HD cath placed and started on dialysis.  Encephalopathy improving. -Plan per nephrology. -Reorientation and delirium precaution  New onset a flutter: Due to the above?  TTE without significant finding.  TSH was normal.  HR elevated to 170 this morning but improved to 110s after IV Cardizem push. -Care per cardiology. -Transfer patient to progressive care. -Optimize electrolytes   Hypokalemia/hypocalcemia/hypomagnesemia -Per nephrology   Atonic neurogenic bladder -Continue suprapubic catheter  Anemia of chronic kidney disease: Stable after 1 unit.  Recent Labs    05/30/22 1445 05/31/22 0723 05/31/22 1117 06/01/22 0759 06/02/22 0411 06/03/22 0248 06/04/22 0414 06/05/22 0247 06/06/22 0053 06/07/22 0809  HGB 8.6* 7.3* 7.5* 6.7* 7.6* 7.1* 8.1* 7.9* 8.1* 7.9*  -Continue monitoring -IV iron on MWF per nephrology   Deconditioning, debility.  -Therapy recommended SNF  Thrombocytopenia: Relatively stable. -Continue  monitoring  Body mass index is 23.11 kg/m.  Nutrition Problem: Inadequate oral intake Etiology: poor appetite Signs/Symptoms: meal completion < 25% Interventions: Nepro shake   DVT prophylaxis:  SCDs Start: 05/30/22 1926 On full dose anticoagulation  Code Status: DNR/DNI Family Communication: None at bedside Level of care: Progressive Status is: Inpatient Remains inpatient appropriate because: Uremic encephalopathy and a flutter with RVR   Final disposition: SNF with HD Consultants:  Nephrology Cardiology  35 minutes with more than 50% spent in reviewing records, counseling patient/family and coordinating care.   Sch Meds:  Scheduled Meds:  sodium chloride   Intravenous Once   calcitRIOL  0.5 mcg Oral Daily   calcium acetate  1,334 mg Oral TID WC   Chlorhexidine Gluconate Cloth  6 each Topical Q0600   darbepoetin (ARANESP) injection - DIALYSIS  40 mcg Subcutaneous Q Fri-1800   diltiazem  120 mg Oral Daily   diltiazem  30 mg Oral Once   feeding supplement (NEPRO CARB STEADY)  237 mL Oral BID BM   loratadine  10 mg Oral Daily   Continuous Infusions:  ferric gluconate (FERRLECIT) IVPB 110 mL/hr at 06/06/22 1828   heparin 1,700 Units/hr (06/07/22 1031)   PRN Meds:.acetaminophen **OR** acetaminophen, HYDROmorphone (DILAUDID) injection, labetalol, melatonin, ondansetron (ZOFRAN) IV, polyethylene glycol  Antimicrobials: Anti-infectives (From admission, onward)    Start     Dose/Rate Route Frequency Ordered Stop   05/31/22 1115  ceFAZolin (ANCEF) IVPB 2g/100 mL premix        2 g 200 mL/hr over 30 Minutes Intravenous  Once 05/31/22 1108 05/31/22 1200   05/31/22 1112  ceFAZolin (ANCEF) 2-4 GM/100ML-% IVPB       Note to Pharmacy: Shanda Bumps M: cabinet override      05/31/22 1112 05/31/22 1206        I have personally reviewed the following labs and images: CBC: Recent Labs  Lab 06/03/22 0248 06/04/22 0414 06/05/22 0247 06/06/22 0053 06/07/22 0809  WBC  6.7 8.5 8.6 9.2 7.8  HGB 7.1* 8.1* 7.9* 8.1* 7.9*  HCT 21.1* 24.4* 23.5* 25.8* 24.4*  MCV 87.6 89.7 90.0 92.1 92.1  PLT 128* 139* 145* 149* 132*   BMP &GFR Recent Labs  Lab 06/01/22 0759 06/02/22 0411 06/03/22 0248 06/04/22 0414 06/05/22 0247 06/05/22 0251 06/06/22 0053 06/07/22 0809  NA 140 137 136 138  --  140 135 134*  K 3.3* 3.1* 2.4* 2.4*  --  3.1* 3.8 3.7  CL 100 94* 94* 91*  --  94* 94* 98  CO2 12* 17* 23 27  --  27 24 24   GLUCOSE 77 77 68* 91  --  96 127* 106*  BUN 206* 143* 77* 79*  --  81* 87* 38*  CREATININE 12.40* 9.38* 6.38* 7.39*  --  8.21* 8.13* 4.94*  CALCIUM 5.5* 5.6* 5.9* 6.2*  --  6.6* 6.9* 7.3*  MG  --  1.3* 1.8 1.7 1.6*  --  2.1  --   PHOS 9.3* 7.4*  --   --   --  5.8* 4.8* 3.1   Estimated Creatinine Clearance: 11.9  mL/min (A) (by C-G formula based on SCr of 4.94 mg/dL (H)). Liver & Pancreas: Recent Labs  Lab 06/01/22 0759 06/02/22 0411 06/05/22 0251 06/06/22 0053 06/07/22 0809  ALBUMIN 3.4* 3.2* 2.7* 3.0* 2.9*   No results for input(s): "LIPASE", "AMYLASE" in the last 168 hours. No results for input(s): "AMMONIA" in the last 168 hours. Diabetic: No results for input(s): "HGBA1C" in the last 72 hours. Recent Labs  Lab 05/31/22 2257 06/01/22 0059 06/01/22 2044  GLUCAP 64* 71 76   Cardiac Enzymes: No results for input(s): "CKTOTAL", "CKMB", "CKMBINDEX", "TROPONINI" in the last 168 hours. No results for input(s): "PROBNP" in the last 8760 hours. Coagulation Profile: No results for input(s): "INR", "PROTIME" in the last 168 hours. Thyroid Function Tests: Recent Labs    06/05/22 0251  TSH 1.494   Lipid Profile: No results for input(s): "CHOL", "HDL", "LDLCALC", "TRIG", "CHOLHDL", "LDLDIRECT" in the last 72 hours. Anemia Panel: No results for input(s): "VITAMINB12", "FOLATE", "FERRITIN", "TIBC", "IRON", "RETICCTPCT" in the last 72 hours. Urine analysis:    Component Value Date/Time   COLORURINE YELLOW 11/29/2020 1705   APPEARANCEUR  CLOUDY (A) 11/29/2020 1705   LABSPEC 1.006 11/29/2020 1705   PHURINE 8.0 11/29/2020 1705   GLUCOSEU NEGATIVE 11/29/2020 1705   HGBUR SMALL (A) 11/29/2020 1705   BILIRUBINUR NEGATIVE 11/29/2020 1705   KETONESUR NEGATIVE 11/29/2020 1705   PROTEINUR 100 (A) 11/29/2020 1705   NITRITE NEGATIVE 11/29/2020 1705   LEUKOCYTESUR LARGE (A) 11/29/2020 1705   Sepsis Labs: Invalid input(s): "PROCALCITONIN", "LACTICIDVEN"  Microbiology: Recent Results (from the past 240 hour(s))  MRSA Next Gen by PCR, Nasal     Status: None   Collection Time: 06/04/22  8:36 AM   Specimen: Nasal Mucosa; Nasal Swab  Result Value Ref Range Status   MRSA by PCR Next Gen NOT DETECTED NOT DETECTED Final    Comment: (NOTE) The GeneXpert MRSA Assay (FDA approved for NASAL specimens only), is one component of a comprehensive MRSA colonization surveillance program. It is not intended to diagnose MRSA infection nor to guide or monitor treatment for MRSA infections. Test performance is not FDA approved in patients less than 27 years old. Performed at [facility], [address] [place], [place] [address]     Radiology Studies: No results found.    Santiel Topper T. Sriyan Cutting [facility]  If 7PM-7AM, please contact night-coverage [email or web address] 06/07/2022, 11:13 AM

## 2022-06-07 NOTE — Progress Notes (Signed)
Physical Therapy Treatment Patient Details Name: Glenn Olson MRN: [id number] DOB: 11/04/1940 Today's Date: 06/07/2022   History of Present Illness Glenn Olson is a 82 y.o. male admitted 5/1  presented to hospital with headache and chest pain for 1 week.  Patient was on hemodialysis but had stopped going for hemodialysis for 1 year now.  Has been having poor oral intake with nausea and vomiting.  In the ED, patient was mildly tachypneic and tachycardic with elevated blood pressure as well as hypokalemia.  Chest x-ray with increased left lower lower lobe markings.  HD reinitiated. afib with RVR developed 5/9, transferred to cardiac progressive. PMH: ESRD, neurogenic atonic bladder    PT Comments    Pt resting upon PT arrival to room, agreeable to OOB mobility. Pt confused today, responding "I don't know" to several questions that he should be able to answer regarding pain, shortness of breath. Pt overall requiring min-mod physical assist for bed mobility, repeated transfers, and short-distance hallway gait with RW. Pt limited by fatigue and DOE (see below). Plan remains appropriate.   HR 90-128 bpm during session, SpO2 83% post-gait on RA with DOE 2/4 quickly recovering >90% with pursed lip breathing and rest      Recommendations for follow up therapy are one component of a multi-disciplinary discharge planning process, led by the attending physician.  Recommendations may be updated based on patient status, additional functional criteria and insurance authorization.  Follow Up Recommendations  Can patient physically be transported by private vehicle: Yes    Assistance Recommended at Discharge Frequent or constant Supervision/Assistance  Patient can return home with the following A lot of help with walking and/or transfers;A lot of help with bathing/dressing/bathroom;Assistance with cooking/housework;Assist for transportation;Help with stairs or ramp for entrance   Equipment  Recommendations  None recommended by PT    Recommendations for Other Services       Precautions / Restrictions Precautions Precautions: Fall Precaution Comments: watch HR Restrictions Weight Bearing Restrictions: No     Mobility  Bed Mobility Overal bed mobility: Needs Assistance Bed Mobility: Supine to Sit, Sit to Supine     Supine to sit: Min assist, HOB elevated Sit to supine: Min assist, HOB elevated   General bed mobility comments: assist for trunk and LE management.    Transfers Overall transfer level: Needs assistance Equipment used: Rolling walker (2 wheels) Transfers: Sit to/from Stand Sit to Stand: Mod assist, From elevated surface           General transfer comment: assist for power up, rise, steadying. cues for correct hand placement when rising and sitting    Ambulation/Gait Ambulation/Gait assistance: Min assist Gait Distance (Feet): 75 Feet Assistive device: Rolling walker (2 wheels) Gait Pattern/deviations: Step-through pattern, Decreased stride length, Trunk flexed, Drifts right/left, Leaning posteriorly Gait velocity: decr     General Gait Details: cues for upright posture and placement in RW, assist to steady. HR 90-128 bpm during session, SpO2 83% post-gait on RA with DOE 2/4 quickly recovering >90% with pursed lip breathing and rest   Stairs             Wheelchair Mobility    Modified Rankin (Stroke Patients Only)       Balance Overall balance assessment: Needs assistance Sitting-balance support: Feet supported Sitting balance-Leahy Scale: Fair     Standing balance support: Bilateral upper extremity supported, During functional activity, Reliant on assistive device for balance Standing balance-Leahy Scale: Poor Standing balance comment: relies on UE support on RW  Cognition Arousal/Alertness: Awake/alert Behavior During Therapy: Restless, Impulsive Overall Cognitive Status:  Impaired/Different from baseline Area of Impairment: Attention, Memory, Following commands, Safety/judgement                   Current Attention Level: Sustained Memory: Decreased recall of precautions Following Commands: Follows one step commands consistently Safety/Judgement: Decreased awareness of safety, Decreased awareness of deficits Awareness: Intellectual Problem Solving: Slow processing, Decreased initiation, Requires verbal cues General Comments: pt oriented to self and location, impulsive and requires multiple cues to wait for PT assist. Pt states "I don't know" to several questions, including "do you feel short of breath?", "are you hurting?"        Exercises      General Comments General comments (skin integrity, edema, etc.): HR 90-128 bpm during session, SpO2 83% post-gait on RA with DOE 2/4 quickly recovering >90% with pursed lip breathing and rest      Pertinent Vitals/Pain Pain Assessment Pain Assessment: Faces Faces Pain Scale: Hurts a little bit Pain Location: generalized Pain Descriptors / Indicators: Discomfort Pain Intervention(s): Limited activity within patient's tolerance, Monitored during session, Repositioned    Home Living                          Prior Function            PT Goals (current goals can now be found in the care plan section) Acute Rehab PT Goals Patient Stated Goal: rehab, then home PT Goal Formulation: With patient/family Time For Goal Achievement: 06/17/22 Potential to Achieve Goals: Fair Progress towards PT goals: Progressing toward goals    Frequency    Min 3X/week      PT Plan Current plan remains appropriate    Co-evaluation              AM-PAC PT "6 Clicks" Mobility   Outcome Measure  Help needed turning from your back to your side while in a flat bed without using bedrails?: A Little Help needed moving from lying on your back to sitting on the side of a flat bed without using  bedrails?: A Little Help needed moving to and from a bed to a chair (including a wheelchair)?: A Little Help needed standing up from a chair using your arms (e.g., wheelchair or bedside chair)?: A Lot Help needed to walk in hospital room?: A Lot Help needed climbing 3-5 steps with a railing? : Total 6 Click Score: 14    End of Session   Activity Tolerance: Patient limited by fatigue Patient left: in bed;with call bell/phone within reach;with bed alarm set Nurse Communication: Mobility status PT Visit Diagnosis: Unsteadiness on feet (R26.81);Muscle weakness (generalized) (M62.81)     Time: 5409-8119 PT Time Calculation (min) (ACUTE ONLY): 14 min  Charges:  $Gait Training: 8-22 mins                     [name], PT DPT Acute Rehabilitation Services Secure Chat Preferred  Office [phone]    Tanishia Lemaster [name] 06/07/2022, 4:28 PM

## 2022-06-07 NOTE — Progress Notes (Addendum)
Rounding Note    Patient Name: Glenn Olson Date of Encounter: 06/07/2022  [facility] Cardiologist: None   Subjective   Developed rapid afib this morning with rates in the 160-170 range. Received IV Cardizem with improvement. Moved to 4east  Inpatient Medications    Scheduled Meds:  sodium chloride   Intravenous Once   calcitRIOL  0.5 mcg Oral Daily   calcium acetate  1,334 mg Oral TID WC   Chlorhexidine Gluconate Cloth  6 each Topical Q0600   darbepoetin (ARANESP) injection - DIALYSIS  40 mcg Subcutaneous Q Fri-1800   diltiazem  120 mg Oral Daily   feeding supplement (NEPRO CARB STEADY)  237 mL Oral BID BM   loratadine  10 mg Oral Daily   Continuous Infusions:  ferric gluconate (FERRLECIT) IVPB 110 mL/hr at 06/06/22 1828   heparin 1,700 Units/hr (06/07/22 1031)   PRN Meds: acetaminophen **OR** acetaminophen, HYDROmorphone (DILAUDID) injection, labetalol, melatonin, ondansetron (ZOFRAN) IV, polyethylene glycol   Vital Signs    Vitals:   06/06/22 2039 06/07/22 0437 06/07/22 0834 06/07/22 1131  BP: 127/65 (!) 120/95 117/88 100/61  Pulse: 98 (!) 52 (!) 169 85  Resp:  (!) 22 18 20   Temp: 98.9 F (37.2 C) 97.8 F (36.6 C) 97.7 F (36.5 C) (!) 97.5 F (36.4 C)  TempSrc: Oral Oral Oral Oral  SpO2: 93% 99% 98% 97%  Weight:      Height:        Intake/Output Summary (Last 24 hours) at 06/07/2022 1142 Last data filed at 06/07/2022 0600 Gross per 24 hour  Intake 442.13 ml  Output 1740 ml  Net -1297.87 ml      06/06/2022    5:45 PM 06/06/2022    1:56 PM 06/02/2022   10:57 AM  Last 3 Weights  Weight (lbs) 156 lb 8.4 oz 159 lb 2.8 oz 153 lb  Weight (kg) 71 kg 72.2 kg 69.4 kg      Telemetry    Atrial flutter, rates 80s - Personally Reviewed  ECG    No new tracing  Physical Exam   GEN: No acute distress.   Neck: No JVD Cardiac: Irreg, no murmurs, rubs, or gallops.  Respiratory: Clear to auscultation bilaterally. GI: Soft, nontender, non-distended   MS: No edema; No deformity. Neuro:  Nonfocal  Psych: Normal affect   Labs    High Sensitivity Troponin:   Recent Labs  Lab 05/30/22 1445 05/30/22 1640  TROPONINIHS 48* 52*     Chemistry Recent Labs  Lab 06/04/22 0414 06/05/22 0247 06/05/22 0251 06/06/22 0053 06/07/22 0809  NA 138  --  140 135 134*  K 2.4*  --  3.1* 3.8 3.7  CL 91*  --  94* 94* 98  CO2 27  --  27 24 24   GLUCOSE 91  --  96 127* 106*  BUN 79*  --  81* 87* 38*  CREATININE 7.39*  --  8.21* 8.13* 4.94*  CALCIUM 6.2*  --  6.6* 6.9* 7.3*  MG 1.7 1.6*  --  2.1  --   ALBUMIN  --   --  2.7* 3.0* 2.9*  GFRNONAA 7*  --  6* 6* 11*  ANIONGAP 20*  --  19* 17* 12    Lipids No results for input(s): "CHOL", "TRIG", "HDL", "LABVLDL", "LDLCALC", "CHOLHDL" in the last 168 hours.  Hematology Recent Labs  Lab 06/05/22 0247 06/06/22 0053 06/07/22 0809  WBC 8.6 9.2 7.8  RBC 2.61* 2.80* 2.65*  HGB 7.9* 8.1* 7.9*  HCT 23.5* 25.8* 24.4*  MCV 90.0 92.1 92.1  MCH 30.3 28.9 29.8  MCHC 33.6 31.4 32.4  RDW 15.0 14.8 14.6  PLT 145* 149* 132*   Thyroid  Recent Labs  Lab 06/05/22 0251  TSH 1.494    BNPNo results for input(s): "BNP", "PROBNP" in the last 168 hours.  DDimer No results for input(s): "DDIMER" in the last 168 hours.   Radiology    ECHOCARDIOGRAM COMPLETE  Result Date: 06/05/2022    ECHOCARDIOGRAM REPORT   Patient Name:   Glenn Olson Date of Exam: 06/05/2022 Medical Rec #:  [id number]      Height:       69.0 in Accession #:    [id number]     Weight:       153.0 lb Date of Birth:  Nov 14, 1940      BSA:          1.844 m Patient Age:    82 years       BP:           116/84 mmHg Patient Gender: M              HR:           117 bpm. Exam Location:  Inpatient Procedure: 2D Echo, Cardiac Doppler and Color Doppler Indications:    Atrial Fibrillation I48.91  History:        Patient has no prior history of Echocardiogram examinations.                 Signs/Symptoms:Chest Pain.  Sonographer:    Lucendia Herrlich Referring  Phys: [id number] [facility] POKHREL  Sonographer Comments: No apical window. IMPRESSIONS  1. Left ventricular ejection fraction, by estimation, is 55 to 60%. The left ventricle has normal function. The left ventricle has no regional wall motion abnormalities. There is mild concentric left ventricular hypertrophy. Left ventricular diastolic function could not be evaluated.  2. Right ventricular systolic function is normal. The right ventricular size is normal. There is mildly elevated pulmonary artery systolic pressure. The estimated right ventricular systolic pressure is 44.2 mmHg.  3. Left atrial size was moderately dilated.  4. Right atrial size was mildly dilated.  5. The mitral valve is grossly normal. Trivial mitral valve regurgitation. No evidence of mitral stenosis.  6. The aortic valve is tricuspid. Aortic valve regurgitation is not visualized. No aortic stenosis is present.  7. The inferior vena cava is dilated in size with <50% respiratory variability, suggesting right atrial pressure of 15 mmHg. FINDINGS  Left Ventricle: Left ventricular ejection fraction, by estimation, is 55 to 60%. The left ventricle has normal function. The left ventricle has no regional wall motion abnormalities. The left ventricular internal cavity size was normal in size. There is  mild concentric left ventricular hypertrophy. Left ventricular diastolic function could not be evaluated due to atrial fibrillation. Left ventricular diastolic function could not be evaluated. Right Ventricle: The right ventricular size is normal. No increase in right ventricular wall thickness. Right ventricular systolic function is normal. There is mildly elevated pulmonary artery systolic pressure. The tricuspid regurgitant velocity is 2.70  m/s, and with an assumed right atrial pressure of 15 mmHg, the estimated right ventricular systolic pressure is 44.2 mmHg. Left Atrium: Left atrial size was moderately dilated. Right Atrium: Right atrial size was mildly  dilated. Pericardium: Trivial pericardial effusion is present. Mitral Valve: The mitral valve is grossly normal. Trivial mitral valve regurgitation. No evidence of mitral valve stenosis. Tricuspid Valve: The tricuspid valve  is grossly normal. Tricuspid valve regurgitation is mild . No evidence of tricuspid stenosis. Aortic Valve: The aortic valve is tricuspid. Aortic valve regurgitation is not visualized. No aortic stenosis is present. Pulmonic Valve: The pulmonic valve was grossly normal. Pulmonic valve regurgitation is trivial. No evidence of pulmonic stenosis. Aorta: The aortic root and ascending aorta are structurally normal, with no evidence of dilitation. Venous: The inferior vena cava is dilated in size with less than 50% respiratory variability, suggesting right atrial pressure of 15 mmHg. IAS/Shunts: The atrial septum is grossly normal.  LEFT VENTRICLE PLAX 2D LVIDd:         4.50 cm LVIDs:         3.30 cm LV PW:         1.60 cm LV IVS:        1.20 cm LVOT diam:     2.10 cm LVOT Area:     3.46 cm  IVC IVC diam: 2.70 cm LEFT ATRIUM           Index LA diam:      5.40 cm 2.93 cm/m LA Vol (A4C): 85.0 ml 46.10 ml/m                        PULMONIC VALVE AORTA                 PR End Diast Vel: 9.36 msec Ao Root diam: 3.70 cm Ao Asc diam:  3.20 cm MITRAL VALVE               TRICUSPID VALVE MV Area (PHT): 10.68 cm   TR Peak grad:   29.2 mmHg MV Decel Time: 71 msec     TR Vmax:        270.00 cm/s MV E velocity: 91.70 cm/s MV A velocity: 50.00 cm/s  SHUNTS MV E/A ratio:  1.83        Systemic Diam: 2.10 cm Lennie Odor MD Electronically signed by Lennie Odor MD Signature Date/Time: 06/05/2022/3:11:07 PM    Final     Cardiac Studies   Echo: 06/05/2022  IMPRESSIONS    1. Left ventricular ejection fraction, by estimation, is 55 to 60%. The left ventricle has normal function. The left ventricle has no regional wall motion abnormalities. There is mild concentric left ventricular hypertrophy. Left ventricular  diastolic function could not be evaluated.  2. Right ventricular systolic function is normal. The right ventricular size is normal. There is mildly elevated pulmonary artery systolic pressure. The estimated right ventricular systolic pressure is 44.2 mmHg.  3. Left atrial size was moderately dilated.  4. Right atrial size was mildly dilated.  5. The mitral valve is grossly normal. Trivial mitral valve regurgitation. No evidence of mitral stenosis.  6. The aortic valve is tricuspid. Aortic valve regurgitation is not visualized. No aortic stenosis is present.  7. The inferior vena cava is dilated in size with <50% respiratory variability, suggesting right atrial pressure of 15 mmHg.  FINDINGS  Left Ventricle: Left ventricular ejection fraction, by estimation, is 55 to 60%. The left ventricle has normal function. The left ventricle has no regional wall motion abnormalities. The left ventricular internal cavity size was normal in size. There is  mild concentric left ventricular hypertrophy. Left ventricular diastolic function could not be evaluated due to atrial fibrillation. Left ventricular diastolic function could not be evaluated.  Right Ventricle: The right ventricular size is normal. No increase in right ventricular wall thickness. Right ventricular systolic  function is normal. There is mildly elevated pulmonary artery systolic pressure. The tricuspid regurgitant velocity is 2.70  m/s, and with an assumed right atrial pressure of 15 mmHg, the estimated right ventricular systolic pressure is 44.2 mmHg.  Left Atrium: Left atrial size was moderately dilated.  Right Atrium: Right atrial size was mildly dilated.  Pericardium: Trivial pericardial effusion is present.  Mitral Valve: The mitral valve is grossly normal. Trivial mitral valve regurgitation. No evidence of mitral valve stenosis.  Tricuspid Valve: The tricuspid valve is grossly normal. Tricuspid valve regurgitation is  mild . No evidence of tricuspid stenosis.  Aortic Valve: The aortic valve is tricuspid. Aortic valve regurgitation is not visualized. No aortic stenosis is present.  Pulmonic Valve: The pulmonic valve was grossly normal. Pulmonic valve regurgitation is trivial. No evidence of pulmonic stenosis.  Aorta: The aortic root and ascending aorta are structurally normal, with no evidence of dilitation.  Venous: The inferior vena cava is dilated in size with less than 50% respiratory variability, suggesting right atrial pressure of 15 mmHg.  IAS/Shunts: The atrial septum is grossly normal.   Patient Profile     82 y.o. male with a hx of end-stage renal disease (has not attended dialysis in over a year), neurogenic atonic bladder, who is being seen 06/05/2022 for the evaluation of new onset A-fib/flutter with RVR at the request of Dr Tyson Babinski.   Assessment & Plan    New onset Atrial flutter -- Patient admitted for uremia secondary to stopping HD with end-stage renal disease. Found to be in new onset atrial flutter. Was initially given a 1x dose of IV cardizem. -- currently on IV heparin, with plans to transition to Eliquis as long as no further invasive procedures planned -- echo showed LVEF of 55-60% with no rWMA, mild LVH, normal RV, moderately dilated left atrium -- developed rapid afib rates this morning, now improved after IV Diltiazem 15mg  x1. Rates now in the 80s -- on Diltiazem CD 120mg  daily, blood pressures tolerating    Hypokalemia Hypocalcemia Hypomagnesemia -- improved   Anemia of chronic disease -- baseline around 7.9-8.0, remains stable  Uremia secondary to end-stage renal disease  -- nephrology following   For questions or updates, please contact [facility] Please consult [email or web address] for contact info under        Signed, Laverda Page, NP  06/07/2022, 11:42 AM    I have examined the patient and reviewed assessment and plan and discussed with patient.   Agree with above as stated.    Episode of high heart rates.  Improved after oral Cardizem plus an IV bolus.  Regimen consolidated to oral therapy with long-acting Cardizem CD 120 mg daily.  Can continue to give 30 mg short acting doses if needed.  Will monitor heart rate and adjust dose of AV nodal blockade accordingly.  Lance Muss

## 2022-06-08 DIAGNOSIS — I4819 Other persistent atrial fibrillation: Secondary | ICD-10-CM | POA: Diagnosis not present

## 2022-06-08 DIAGNOSIS — N19 Unspecified kidney failure: Secondary | ICD-10-CM | POA: Diagnosis not present

## 2022-06-08 DIAGNOSIS — Z978 Presence of other specified devices: Secondary | ICD-10-CM | POA: Diagnosis not present

## 2022-06-08 DIAGNOSIS — E876 Hypokalemia: Secondary | ICD-10-CM | POA: Diagnosis not present

## 2022-06-08 DIAGNOSIS — N186 End stage renal disease: Secondary | ICD-10-CM | POA: Diagnosis not present

## 2022-06-08 LAB — RENAL FUNCTION PANEL
Albumin: 2.9 g/dL — ABNORMAL LOW (ref 3.5–5.0)
Anion gap: 16 — ABNORMAL HIGH (ref 5–15)
BUN: 45 mg/dL — ABNORMAL HIGH (ref 8–23)
CO2: 24 mmol/L (ref 22–32)
Calcium: 7.7 mg/dL — ABNORMAL LOW (ref 8.9–10.3)
Chloride: 95 mmol/L — ABNORMAL LOW (ref 98–111)
Creatinine, Ser: 5.54 mg/dL — ABNORMAL HIGH (ref 0.61–1.24)
GFR, Estimated: 10 mL/min — ABNORMAL LOW (ref >60)
Glucose, Bld: 111 mg/dL — ABNORMAL HIGH (ref 70–99)
Phosphorus: 3.2 mg/dL (ref 2.5–4.6)
Potassium: 4 mmol/L (ref 3.5–5.1)
Sodium: 135 mmol/L (ref 135–145)

## 2022-06-08 LAB — CBC
HCT: 22.7 % — ABNORMAL LOW (ref 39.0–52.0)
Hemoglobin: 7.5 g/dL — ABNORMAL LOW (ref 13.0–17.0)
MCH: 30.6 pg (ref 26.0–34.0)
MCHC: 33 g/dL (ref 30.0–36.0)
MCV: 92.7 fL (ref 80.0–100.0)
Platelets: 138 10*3/uL — ABNORMAL LOW (ref 150–400)
RBC: 2.45 MIL/uL — ABNORMAL LOW (ref 4.22–5.81)
RDW: 14.6 % (ref 11.5–15.5)
WBC: 8.1 10*3/uL (ref 4.0–10.5)
nRBC: 0 % (ref 0.0–0.2)

## 2022-06-08 LAB — MAGNESIUM: Magnesium: 1.8 mg/dL (ref 1.7–2.4)

## 2022-06-08 LAB — HEPARIN LEVEL (UNFRACTIONATED)
Heparin Unfractionated: 0.13 IU/mL — ABNORMAL LOW (ref 0.30–0.70)
Heparin Unfractionated: 0.21 IU/mL — ABNORMAL LOW (ref 0.30–0.70)

## 2022-06-08 MED ORDER — DILTIAZEM HCL 25 MG/5ML IV SOLN
10.0000 mg | Freq: Once | INTRAVENOUS | Status: AC
  Administered 2022-06-08: 10 mg via INTRAVENOUS
  Filled 2022-06-08: qty 5

## 2022-06-08 MED ORDER — DILTIAZEM HCL ER COATED BEADS 180 MG PO CP24
180.0000 mg | ORAL_CAPSULE | Freq: Every day | ORAL | Status: DC
  Administered 2022-06-09 – 2022-06-13 (×5): 180 mg via ORAL
  Filled 2022-06-08 (×5): qty 1

## 2022-06-08 MED ORDER — HEPARIN SODIUM (PORCINE) 1000 UNIT/ML IJ SOLN
INTRAMUSCULAR | Status: AC
  Filled 2022-06-08: qty 4

## 2022-06-08 MED ORDER — GUAIFENESIN-DM 100-10 MG/5ML PO SYRP
5.0000 mL | ORAL_SOLUTION | ORAL | Status: DC | PRN
  Administered 2022-06-08 – 2022-06-10 (×4): 5 mL via ORAL
  Filled 2022-06-08 (×4): qty 5

## 2022-06-08 MED ORDER — DILTIAZEM HCL 30 MG PO TABS
30.0000 mg | ORAL_TABLET | Freq: Four times a day (QID) | ORAL | Status: DC | PRN
  Administered 2022-06-08 – 2022-06-17 (×11): 30 mg via ORAL
  Filled 2022-06-08 (×12): qty 1

## 2022-06-08 MED ORDER — MAGNESIUM SULFATE 2 GM/50ML IV SOLN
2.0000 g | Freq: Once | INTRAVENOUS | Status: AC
  Administered 2022-06-08: 2 g via INTRAVENOUS
  Filled 2022-06-08: qty 50

## 2022-06-08 MED ORDER — DILTIAZEM HCL ER 60 MG PO CP12
60.0000 mg | ORAL_CAPSULE | Freq: Once | ORAL | Status: AC
  Administered 2022-06-08: 60 mg via ORAL
  Filled 2022-06-08: qty 1

## 2022-06-08 NOTE — Procedures (Signed)
HD Note:  Some information was entered later than the data was gathered due to patient care needs. The stated time with the data is accurate.  Received patient in bed to unit.  Alert and oriented to self.  Informed consent signed and in chart.   TX duration: 3.5 hours  Patient tolerated well. He rested with his eyes closed and very still during the entire treatment.  He would respond when spoken to and was oriented to self.  Transported back to the room  Alert, without acute distress.  Hand-off given to patient's nurse.   Access used: Right upper chest HD catheter Access issues: None  Total UF removed: 1000 ml    [facility]

## 2022-06-08 NOTE — Progress Notes (Signed)
Patient heart rate is At. Fib. With RVR rate 120-150 at rest. Then comes down to less than 120 for few seconds then back up. Heart rate up 160's when using BSC. Patient voices no complaint. Tim R.N. Aware and BP 131/91 MAP 102. Dr Hetty Ely text paged the above .

## 2022-06-08 NOTE — Progress Notes (Signed)
ANTICOAGULATION CONSULT NOTE  Pharmacy Consult for IV heparin Indication: atrial fibrillation  No Known Allergies  Patient Measurements: Height: 5\' 9"  (175.3 cm) Weight: 76 kg (167 lb 8.8 oz) IBW/kg (Calculated) : 70.7 Heparin Dosing Weight: ~ 70 kg  Vital Signs: Temp: 97.8 F (36.6 C) (05/10 1039) Temp Source: Axillary (05/10 1039) BP: 118/87 (05/10 1501) Pulse Rate: 98 (05/10 1501)  Labs: Recent Labs    06/06/22 0053 06/06/22 1023 06/07/22 0804 06/07/22 0809 06/07/22 1756 06/08/22 0140 06/08/22 1257  HGB 8.1*  --   --  7.9*  --  7.5*  --   HCT 25.8*  --   --  24.4*  --  22.7*  --   PLT 149*  --   --  132*  --  138*  --   APTT  --   --  81*  --   --   --   --   HEPARINUNFRC <0.10*   < >  --  <0.10* <0.10* 0.13* 0.21*  CREATININE 8.13*  --   --  4.94*  --  5.54*  --    < > = values in this interval not displayed.     Estimated Creatinine Clearance: 10.6 mL/min (A) (by C-G formula based on SCr of 5.54 mg/dL (H)).   Medical History: Past Medical History:  Diagnosis Date   Anemia    Chronic kidney disease    Dysrhythmia    GERD (gastroesophageal reflux disease)    Neurogenic bladder     Medications:  Infusions:   ferric gluconate (FERRLECIT) IVPB 110 mL/hr at 06/08/22 1434   heparin 2,200 Units/hr (06/08/22 1434)    Assessment: 82 yo male with new afib. Patient has chronic anemia 2/2 ESRD- HD on [facility] asked to start anticoagulation with IV heparin.   Heparin level this afternoon still slightly lower than goal at 0.21.  No overt bleeding or complications noted, no known issues with IV infusion.  Goal of Therapy:  Heparin level 0.3-0.7 units/ml Monitor platelets by anticoagulation protocol: Yes   Plan:  Increase Heparin infusion to 2400 units / hr Heparin level tonight at midnight Monitor daily HL, CBC, and for s/sx of bleeding   Jenetta Downer, [facility] Clinical Pharmacist  06/08/2022 3:33 PM   [facility] pharmacy phone numbers  are listed on [email or web address]

## 2022-06-08 NOTE — Progress Notes (Signed)
Inpatient Rehab Admissions Coordinator:   Pt remains in Afib.  Continue to follow.   Estill Dooms, PT, DPT Admissions Coordinator [phone] 06/08/22  10:20 AM

## 2022-06-08 NOTE — Progress Notes (Signed)
PROGRESS NOTE  Glenn Olson ZOX:[id number] DOB: 02-12-1940   PCP: Benita Stabile, MD  Patient is from: Home  DOA: 05/30/2022 LOS: 9  Chief complaints Chief Complaint  Patient presents with   Headache     Brief Narrative / Interim history: 82 y.o. male with medical history significant for ESRD, neurogenic atonic bladder presented to [facility] with headache and chest pain for 1 week.  Patient was on hemodialysis but had stopped going for hemodialysis for 1 year now.  Has been having poor oral intake with nausea and vomiting.  In the ED, patient was mildly tachypneic and tachycardic with elevated blood pressure.  Chest x-ray with increased left lower lower lobe markings. CO2 less than 7.  BUN of 241.  Potassium 4.5.  Cr 13.8.Marland KitchenTroponin 48 > 52.  Nephrology was consulted and decision was to proceed with hemodialysis.  Patient was then transferred to [facility].   Patient has had HD cath placed and started on hemodialysis.  Encephalopathy improving.  Hospital course complicated by A-fib with RVR.  He was started on p.o. Cardizem and IV heparin.  TTE without significant finding.  Remains in RVR.  Cardiology titrating p.o. Cardizem.  Therapy recommended CIR.  CIR following.    Subjective: Seen and examined earlier this morning.  Had A-fib with RVR with HR ranging from 120s to 150s overnight.  He was given some Cardizem push.  Heart rate in 110s this morning.  Cardiology increased his Cardizem this morning.  Patient seems to be asymptomatic.  He denies palpitation, dizziness, chest pain or shortness of breath.  Mental status improved  Objective: Vitals:   06/08/22 0546 06/08/22 0739 06/08/22 1039 06/08/22 1229  BP: 131/86 116/71 118/84   Pulse: (!) 114 (!) 117 (!) 120 (!) 123  Resp: 20 20 19 20   Temp: 97.9 F (36.6 C) 98.1 F (36.7 C) 97.8 F (36.6 C)   TempSrc: Oral Oral Axillary   SpO2: 98%  99% 94%  Weight:      Height:        Examination:  GENERAL: No  apparent distress.  Nontoxic. HEENT: MMM.  Vision and hearing grossly intact.  NECK: Supple.  No apparent JVD.  RESP:  No IWOB.  Fair aeration bilaterally. CVS: Irregular rhythm.  HR 110s.  Heart sounds normal.  ABD/GI/GU: BS+. Abd soft, NTND.  MSK/EXT:   No apparent deformity.  Significant muscle mass and subcu fat loss. SKIN: no apparent skin lesion or wound NEURO: Awake and alert. Oriented x 4 except date of months.  No apparent focal neuro deficit. PSYCH: Calm. Normal affect.   Procedures:  HD catheter placed  Microbiology summarized: MRSA PCR screen nonreactive.  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 Has had HD cath placed and started on dialysis.  Encephalopathy improved.  Currently awake and alert and oriented x 4 except date of month. -HD per nephrology. -Reorientation and delirium precaution  New onset a flutter/A-fib with RVR: TTE without significant finding.  HR in the range of 120s to 150s overnight.  -Care per cardiology-increased p.o. Cardizem CD to 180 mg daily with as needed p.o. Cardizem -Continue heparin for anticoagulation -Optimize  electrolytes   Hypokalemia/hypocalcemia/hypomagnesemia -Per nephrology   Atonic neurogenic bladder -Continue suprapubic catheter   Anemia of chronic kidney disease: Relatively stable after 1 unit. Recent Labs    05/31/22 0723 05/31/22 1117 06/01/22 0759 06/02/22 0411 06/03/22 0248 06/04/22 0414 06/05/22 0247 06/06/22 0053 06/07/22 0809 06/08/22 0140  HGB 7.3* 7.5* 6.7* 7.6* 7.1* 8.1* 7.9* 8.1* 7.9* 7.5*  -Continue monitoring -IV iron on MWF per nephrology   Deconditioning, debility.  -Therapy  recommended SNF  Thrombocytopenia: Relatively stable. -Continue monitoring  Body mass index is 24.74 kg/m. Nutrition Problem: Inadequate oral intake Etiology: poor appetite Signs/Symptoms: meal completion < 25% Interventions: Nepro shake   DVT prophylaxis:  SCDs Start: 05/30/22 1926 On full dose anticoagulation  Code Status: DNR/DNI Family Communication: None at bedside Level of care: Progressive Status is: Inpatient Remains inpatient appropriate because: Uremic encephalopathy and a flutter/A-fib with RVR   Final disposition: SNF with HD Consultants:  Nephrology Cardiology  35 minutes with more than 50% spent in reviewing records, counseling patient/family and coordinating care.   Sch Meds:  Scheduled Meds:  sodium chloride   Intravenous Once   calcitRIOL  0.5 mcg Oral Daily   calcium acetate  1,334 mg Oral TID WC   Chlorhexidine Gluconate Cloth  6 each Topical Q0600   darbepoetin (ARANESP) injection - DIALYSIS  40 mcg Subcutaneous Q Fri-1800   [START ON 06/09/2022] diltiazem  180 mg Oral Daily   feeding supplement (NEPRO CARB STEADY)  237 mL Oral BID BM   loratadine  10 mg Oral Daily   Continuous Infusions:  ferric gluconate (FERRLECIT) IVPB 110 mL/hr at 06/06/22 1828   heparin 2,200 Units/hr (06/08/22 0807)   PRN Meds:.acetaminophen **OR** acetaminophen, diltiazem, guaiFENesin-dextromethorphan, HYDROmorphone (DILAUDID) injection, labetalol, melatonin, ondansetron (ZOFRAN) IV, polyethylene glycol  Antimicrobials: Anti-infectives (From admission, onward)    Start     Dose/Rate Route Frequency Ordered Stop   05/31/22 1115  ceFAZolin (ANCEF) IVPB 2g/100 mL premix        2 g 200 mL/hr over 30 Minutes Intravenous  Once 05/31/22 1108 05/31/22 1200   05/31/22 1112  ceFAZolin (ANCEF) 2-4 GM/100ML-% IVPB       Note to Pharmacy: Shanda Bumps M: cabinet override      05/31/22 1112 05/31/22 1206        I have personally reviewed the following labs and  images: CBC: Recent Labs  Lab 06/04/22 0414 06/05/22 0247 06/06/22 0053 06/07/22 0809 06/08/22 0140  WBC 8.5 8.6 9.2 7.8 8.1  HGB 8.1* 7.9* 8.1* 7.9* 7.5*  HCT 24.4* 23.5* 25.8* 24.4* 22.7*  MCV 89.7 90.0 92.1 92.1 92.7  PLT 139* 145* 149* 132* 138*   BMP &GFR Recent Labs  Lab 06/02/22 0411 06/03/22 0248 06/04/22 0414 06/05/22 0247 06/05/22 0251 06/06/22 0053 06/07/22 0809 06/08/22 0140  NA 137   < > 138  --  140 135 134* 135  K 3.1*   < > 2.4*  --  3.1* 3.8 3.7 4.0  CL 94*   < > 91*  --  94* 94* 98 95*  CO2 17*   < > 27  --  27 24 24 24   GLUCOSE 77   < > 91  --  96 127* 106* 111*  BUN 143*   < > 79*  --  81* 87* 38* 45*  CREATININE 9.38*   < > 7.39*  --  8.21* 8.13* 4.94* 5.54*  CALCIUM 5.6*   < > 6.2*  --  6.6* 6.9* 7.3* 7.7*  MG  1.3*   < > 1.7 1.6*  --  2.1 1.5* 1.8  PHOS 7.4*  --   --   --  5.8* 4.8* 3.1 3.2   < > = values in this interval not displayed.   Estimated Creatinine Clearance: 10.6 mL/min (A) (by C-G formula based on SCr of 5.54 mg/dL (H)). Liver & Pancreas: Recent Labs  Lab 06/02/22 0411 06/05/22 0251 06/06/22 0053 06/07/22 0809 06/08/22 0140  ALBUMIN 3.2* 2.7* 3.0* 2.9* 2.9*   No results for input(s): "LIPASE", "AMYLASE" in the last 168 hours. No results for input(s): "AMMONIA" in the last 168 hours. Diabetic: No results for input(s): "HGBA1C" in the last 72 hours. Recent Labs  Lab 06/01/22 2044  GLUCAP 76   Cardiac Enzymes: No results for input(s): "CKTOTAL", "CKMB", "CKMBINDEX", "TROPONINI" in the last 168 hours. No results for input(s): "PROBNP" in the last 8760 hours. Coagulation Profile: No results for input(s): "INR", "PROTIME" in the last 168 hours. Thyroid Function Tests: No results for input(s): "TSH", "T4TOTAL", "FREET4", "T3FREE", "THYROIDAB" in the last 72 hours.  Lipid Profile: No results for input(s): "CHOL", "HDL", "LDLCALC", "TRIG", "CHOLHDL", "LDLDIRECT" in the last 72 hours. Anemia Panel: No results for input(s):  "VITAMINB12", "FOLATE", "FERRITIN", "TIBC", "IRON", "RETICCTPCT" in the last 72 hours. Urine analysis:    Component Value Date/Time   COLORURINE YELLOW 11/29/2020 1705   APPEARANCEUR CLOUDY (A) 11/29/2020 1705   LABSPEC 1.006 11/29/2020 1705   PHURINE 8.0 11/29/2020 1705   GLUCOSEU NEGATIVE 11/29/2020 1705   HGBUR SMALL (A) 11/29/2020 1705   BILIRUBINUR NEGATIVE 11/29/2020 1705   KETONESUR NEGATIVE 11/29/2020 1705   PROTEINUR 100 (A) 11/29/2020 1705   NITRITE NEGATIVE 11/29/2020 1705   LEUKOCYTESUR LARGE (A) 11/29/2020 1705   Sepsis Labs: Invalid input(s): "PROCALCITONIN", "LACTICIDVEN"  Microbiology: Recent Results (from the past 240 hour(s))  MRSA Next Gen by PCR, Nasal     Status: None   Collection Time: 06/04/22  8:36 AM   Specimen: Nasal Mucosa; Nasal Swab  Result Value Ref Range Status   MRSA by PCR Next Gen NOT DETECTED NOT DETECTED Final    Comment: (NOTE) The GeneXpert MRSA Assay (FDA approved for NASAL specimens only), is one component of a comprehensive MRSA colonization surveillance program. It is not intended to diagnose MRSA infection nor to guide or monitor treatment for MRSA infections. Test performance is not FDA approved in patients less than 86 years old. Performed at [facility], [address], [place], [place] [address]     Radiology Studies: No results found.    Kamil Hanigan T. Lealand Elting [facility]  If 7PM-7AM, please contact night-coverage [email or web address] 06/08/2022, 12:30 PM

## 2022-06-08 NOTE — Progress Notes (Signed)
Rounding Note    Patient Name: Glenn Olson Date of Encounter: 06/08/2022  [facility] Cardiologist: None   Subjective   Feels ok.  No palpitations  Inpatient Medications    Scheduled Meds:  sodium chloride   Intravenous Once   calcitRIOL  0.5 mcg Oral Daily   calcium acetate  1,334 mg Oral TID WC   Chlorhexidine Gluconate Cloth  6 each Topical Q0600   darbepoetin (ARANESP) injection - DIALYSIS  40 mcg Subcutaneous Q Fri-1800   [START ON 06/09/2022] diltiazem  180 mg Oral Daily   feeding supplement (NEPRO CARB STEADY)  237 mL Oral BID BM   loratadine  10 mg Oral Daily   Continuous Infusions:  ferric gluconate (FERRLECIT) IVPB 110 mL/hr at 06/06/22 1828   heparin 2,200 Units/hr (06/08/22 0807)   PRN Meds: acetaminophen **OR** acetaminophen, diltiazem, guaiFENesin-dextromethorphan, HYDROmorphone (DILAUDID) injection, labetalol, melatonin, ondansetron (ZOFRAN) IV, polyethylene glycol   Vital Signs    Vitals:   06/08/22 0347 06/08/22 0546 06/08/22 0739 06/08/22 1039  BP: 111/64 131/86 116/71 118/84  Pulse: (!) 113 (!) 114 (!) 117 (!) 120  Resp: 20 20 20 19   Temp: (!) 97.4 F (36.3 C) 97.9 F (36.6 C) 98.1 F (36.7 C) 97.8 F (36.6 C)  TempSrc: Oral Oral Oral Axillary  SpO2: 99% 98%  99%  Weight: 76 kg     Height:        Intake/Output Summary (Last 24 hours) at 06/08/2022 1159 Last data filed at 06/08/2022 1041 Gross per 24 hour  Intake 391.4 ml  Output 900 ml  Net -508.6 ml      06/08/2022    3:47 AM 06/06/2022    5:45 PM 06/06/2022    1:56 PM  Last 3 Weights  Weight (lbs) 167 lb 8.8 oz 156 lb 8.4 oz 159 lb 2.8 oz  Weight (kg) 76 kg 71 kg 72.2 kg      Telemetry    Atrial fibrillation with intermittent rapid ventricular response- Personally Reviewed  ECG      Physical Exam   GEN: No acute distress.   Neck: No JVD Cardiac: Tachycardic, irregular, no murmurs, rubs, or gallops.  Respiratory: Clear to auscultation bilaterally. GI: Soft,  nontender, non-distended  MS: No edema; No deformity. Neuro:  Nonfocal  Psych: Normal affect   Labs    High Sensitivity Troponin:   Recent Labs  Lab 05/30/22 1445 05/30/22 1640  TROPONINIHS 48* 52*     Chemistry Recent Labs  Lab 06/06/22 0053 06/07/22 0809 06/08/22 0140  NA 135 134* 135  K 3.8 3.7 4.0  CL 94* 98 95*  CO2 24 24 24   GLUCOSE 127* 106* 111*  BUN 87* 38* 45*  CREATININE 8.13* 4.94* 5.54*  CALCIUM 6.9* 7.3* 7.7*  MG 2.1 1.5* 1.8  ALBUMIN 3.0* 2.9* 2.9*  GFRNONAA 6* 11* 10*  ANIONGAP 17* 12 16*    Lipids No results for input(s): "CHOL", "TRIG", "HDL", "LABVLDL", "LDLCALC", "CHOLHDL" in the last 168 hours.  Hematology Recent Labs  Lab 06/06/22 0053 06/07/22 0809 06/08/22 0140  WBC 9.2 7.8 8.1  RBC 2.80* 2.65* 2.45*  HGB 8.1* 7.9* 7.5*  HCT 25.8* 24.4* 22.7*  MCV 92.1 92.1 92.7  MCH 28.9 29.8 30.6  MCHC 31.4 32.4 33.0  RDW 14.8 14.6 14.6  PLT 149* 132* 138*   Thyroid  Recent Labs  Lab 06/05/22 0251  TSH 1.494    BNPNo results for input(s): "BNP", "PROBNP" in the last 168 hours.  DDimer No  results for input(s): "DDIMER" in the last 168 hours.   Radiology    No results found.  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  Additional 60 milligram sustained Cardizem dose given today in addition to the 120 mg.  As needed short acting Cardizem also added for rate over 120.  No palpitations.  On IV heparin for stroke prevention.  Can switch to oral when all invasive procedures are completed.   Continue to increase Cardizem if heart rate stays elevated, as blood pressure tolerates.  ESRD: Tolerating dialysis well per his report.     For questions or updates, please contact [facility] Please consult [email or web address] for contact info under        Signed, Lance Muss, MD  06/08/2022, 11:59  AM

## 2022-06-08 NOTE — Progress Notes (Addendum)
Text paged Dr. Brayton Layman Patient still in At Fib at rest rate 110-150 . Patient tolerating . BP 131/86 MAP 100. Rate did come down some after Cardizem 10 mg IV given earlier tonight. Tim RN aware

## 2022-06-08 NOTE — Progress Notes (Signed)
Admit: 05/30/2022 LOS: 9   Glenn Olson is an 82 year old male who presented with symptoms of fatigue, headache, chest pain in setting of quitting HD quit about 14 months ago because he did not like going and stopped all of his medications. HD catheter was placed by [facility] 5/2 and patient has resumed HD.   Subjective:  Patient sitting up in bed, eating breakfast, states he is feeling well, no complaints or concerns  05/09 0701 - 05/10 0700 In: 391.4 [P.O.:120; I.V.:232; IV Piggyback:39.4] Out: 650 [Urine:650]  Filed Weights   06/06/22 1356 06/06/22 1745 06/08/22 0347  Weight: 72.2 kg 71 kg 76 kg    Scheduled Meds:  sodium chloride   Intravenous Once   calcitRIOL  0.5 mcg Oral Daily   calcium acetate  1,334 mg Oral TID WC   Chlorhexidine Gluconate Cloth  6 each Topical Q0600   darbepoetin (ARANESP) injection - DIALYSIS  40 mcg Subcutaneous Q Fri-1800   diltiazem  120 mg Oral Daily   feeding supplement (NEPRO CARB STEADY)  237 mL Oral BID BM   loratadine  10 mg Oral Daily   Continuous Infusions:  ferric gluconate (FERRLECIT) IVPB 110 mL/hr at 06/06/22 1828   heparin 2,200 Units/hr (06/08/22 0456)   PRN Meds:.acetaminophen **OR** acetaminophen, guaiFENesin-dextromethorphan, HYDROmorphone (DILAUDID) injection, labetalol, melatonin, ondansetron (ZOFRAN) IV, polyethylene glycol  Current Labs: reviewed    Physical Exam:  Blood pressure 131/86, pulse (!) 114, temperature 97.9 F (36.6 C), temperature source Oral, resp. rate 20, height 5\' 9"  (1.753 m), weight 76 kg, SpO2 98 %. General: 82 year old male, sitting up in bed, pleasant to speak with, NAD Cardio: Tachycardic, irregular Pulm: Mild expiratory wheezes, breathing comfortably Abdomen: Soft, nontender palpation, nondistended Extremities: No edema BLEs  A ESRD: HD catheter placed 5/2, HD session on 5/2 cut short to 30 minutes due to patient agitation.  HD 06/01/22 cut short 15 min due to machine error and tolerated HD well on  06/02/2022-no sitter needed. Did not have HD 5/6 or 5/7 d/t HR in 130's and hypokalemia. HD resumed yesterday (5/8) for 3.5 hrs and tolerated well.   A fib w/ RVR - new this admission. Cardiology is on board and started po dilt and heparin 5/7 with plans to switch to eliquis. Echo showed EF 55-60% Anemia of CKD V: Hgb  7.9>7.5 today. S/p I Unit RBCs on 5/3. 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 Phosphorous 3.2 today with phoslo and HD. Ca 7.7 today s/p calcium gluconate and calcitriol daily Neurogenic bladder Hypokalemia - K 4 today  Hypomagnesemia - 1.8 today after repletion yesterday   Metabolic acidosis resolved, bicarb 24 today   P ESRD: Patient is stable from a nephrology standpoint.  Will plan to continue HD in hospital while he is here for other issues and will need outpatient arrangements at [facility] when stable for discharge. Vascular access - he has a functioning AVG in his left forearm, as well as a RIJ TDC.   He does not want to use AVG at this time.  Will continue with [facility] for now.  Will address this in the future if he continues to tolerate HD.   A fib w/ RVR - management per cardiology Anemia of CKD V:. CTM hgb. Started Aranesp 40 mcg weekly.  Transfuse prn with transfusion threshold hgb <7.  IV iron w/ HD CKD-BMD - Continue PhosLo and calcitriol.  Will follow phosphorus and calcium levels Neurogenic bladder-suprapubic catheter in place  Hypokalemia-CTM Hypomagnesemia-CTM Metabolic acidosis, bicarb improved CTM Medication Issues; Preferred narcotic agents for pain control are hydromorphone, fentanyl, and methadone. Morphine should not be used.  Baclofen should be avoided Avoid oral sodium phosphate and magnesium citrate based laxatives / bowel preps   Erick Alley, DO Family Medicine, PGY-2  Recent Labs  Lab 06/06/22 0053 06/07/22 0809 06/08/22 0140  NA 135 134* 135  K 3.8 3.7 4.0  CL 94* 98 95*  CO2 24 24 24   GLUCOSE 127* 106* 111*   BUN 87* 38* 45*  CREATININE 8.13* 4.94* 5.54*  CALCIUM 6.9* 7.3* 7.7*  PHOS 4.8* 3.1 3.2   Recent Labs  Lab 06/06/22 0053 06/07/22 0809 06/08/22 0140  WBC 9.2 7.8 8.1  HGB 8.1* 7.9* 7.5*  HCT 25.8* 24.4* 22.7*  MCV 92.1 92.1 92.7  PLT 149* 132* 138*

## 2022-06-08 NOTE — Progress Notes (Signed)
Occupational Therapy Treatment Patient Details Name: Glenn Olson MRN: [id number] DOB: [birth year] Today's Date: 06/08/2022   History of present illness Halid Ormiston is a 82 y.o. male admitted 5/1  presented to hospital with headache and chest pain for 1 week.  Patient was on hemodialysis but had stopped going for hemodialysis for 1 year now.  Has been having poor oral intake with nausea and vomiting.  In the ED, patient was mildly tachypneic and tachycardic with elevated blood pressure as well as hypokalemia.  Chest x-ray with increased left lower lower lobe markings.  HD reinitiated. afib with RVR developed 5/9, transferred to cardiac progressive. PMH: ESRD, neurogenic atonic bladder   OT comments  Patient with onset of a-fib, HR 109-122 throughout.  Treatment kept to in room mobility with an eye on HR.  Patient able to perform bed mobility with supervision, and able to complete seated grooming with setup only.  OT will continue efforts in the acute setting to address deficits and assist with transition to next level of care.  Patient will benefit from continued inpatient follow up therapy, <3 hours/day.   Recommendations for follow up therapy are one component of a multi-disciplinary discharge planning process, led by the attending physician.  Recommendations may be updated based on patient status, additional functional criteria and insurance authorization.    Assistance Recommended at Discharge Frequent or constant Supervision/Assistance  Patient can return home with the following  A lot of help with walking and/or transfers;A lot of help with bathing/dressing/bathroom;Assistance with cooking/housework;Assist for transportation;Help with stairs or ramp for entrance   Equipment Recommendations       Recommendations for Other Services      Precautions / Restrictions Precautions Precautions: Fall Precaution Comments: watch HR Restrictions Weight Bearing Restrictions: No        Mobility Bed Mobility Overal bed mobility: Needs Assistance Bed Mobility: Supine to Sit, Sit to Supine     Supine to sit: Supervision Sit to supine: Supervision        Transfers                         Balance Overall balance assessment: Needs assistance Sitting-balance support: Feet supported Sitting balance-Leahy Scale: Good                                     ADL either performed or assessed with clinical judgement   ADL   Eating/Feeding: Independent;Bed level   Grooming: Set up;Bed level                                      Extremity/Trunk Assessment Upper Extremity Assessment Upper Extremity Assessment: Overall WFL for tasks assessed   Lower Extremity Assessment Lower Extremity Assessment: Defer to PT evaluation   Cervical / Trunk Assessment Cervical / Trunk Assessment: Kyphotic                      Cognition Arousal/Alertness: Lethargic Behavior During Therapy: WFL for tasks assessed/performed Overall Cognitive Status: No family/caregiver present to determine baseline cognitive functioning  Pertinent Vitals/ Pain       Pain Assessment Pain Assessment: No/denies pain Pain Intervention(s): Monitored during session                                                          Frequency  Min 2X/week        Progress Toward Goals  OT Goals(current goals can now be found in the care plan section)  Progress towards OT goals: Progressing toward goals  Acute Rehab OT Goals OT Goal Formulation: With patient Time For Goal Achievement: 06/19/22 Potential to Achieve Goals: Fair  Plan Discharge plan remains appropriate    Co-evaluation                 AM-PAC OT "6 Clicks" Daily Activity     Outcome Measure   Help from another person eating meals?: None Help from another person taking  care of personal grooming?: None Help from another person toileting, which includes using toliet, bedpan, or urinal?: A Lot Help from another person bathing (including washing, rinsing, drying)?: A Lot Help from another person to put on and taking off regular upper body clothing?: A Little Help from another person to put on and taking off regular lower body clothing?: A Lot 6 Click Score: 17    End of Session Equipment Utilized During Treatment: Gait belt;Rolling walker (2 wheels)  OT Visit Diagnosis: Unsteadiness on feet (R26.81);Other abnormalities of gait and mobility (R26.89);Muscle weakness (generalized) (M62.81)   Activity Tolerance Patient tolerated treatment well   Patient Left in bed;with call bell/phone within reach;with bed alarm set   Nurse Communication Mobility status        Time: [phone] OT Time Calculation (min): 17 min  Charges: OT General Charges $OT Visit: 1 Visit OT Treatments $Self Care/Home Management : 8-22 mins  06/08/2022  RP, OTR/L  Acute Rehabilitation Services  Office:  [phone]   Suzanna Obey 06/08/2022, 9:43 AM

## 2022-06-08 NOTE — Progress Notes (Addendum)
Call return see orders for Cardizem 10 mg bolus x 1 now Tim. R.N. aware.

## 2022-06-08 NOTE — Progress Notes (Signed)
ANTICOAGULATION CONSULT NOTE- follow-up  Pharmacy Consult for IV heparin Indication: atrial fibrillation  No Known Allergies  Patient Measurements: Height: 5\' 9"  (175.3 cm) Weight: 76 kg (167 lb 8.8 oz) IBW/kg (Calculated) : 70.7 Heparin Dosing Weight: ~ 70 kg  Vital Signs: Temp: 97.4 F (36.3 C) (05/10 0347) Temp Source: Oral (05/10 0347) BP: 111/64 (05/10 0347) Pulse Rate: 113 (05/10 0347)  Labs: Recent Labs    06/06/22 0053 06/06/22 1023 06/07/22 0804 06/07/22 0809 06/07/22 1756 06/08/22 0140  HGB 8.1*  --   --  7.9*  --  7.5*  HCT 25.8*  --   --  24.4*  --  22.7*  PLT 149*  --   --  132*  --  138*  APTT  --   --  81*  --   --   --   HEPARINUNFRC <0.10*   < >  --  <0.10* <0.10* 0.13*  CREATININE 8.13*  --   --  4.94*  --  5.54*   < > = values in this interval not displayed.     Estimated Creatinine Clearance: 10.6 mL/min (A) (by C-G formula based on SCr of 5.54 mg/dL (H)).   Medical History: Past Medical History:  Diagnosis Date   Anemia    Chronic kidney disease    Dysrhythmia    GERD (gastroesophageal reflux disease)    Neurogenic bladder     Medications:  Infusions:   ferric gluconate (FERRLECIT) IVPB 110 mL/hr at 06/06/22 1828   heparin 1,900 Units/hr (06/07/22 1950)    Assessment: 82 yo male with new afib. Patient has chronic anemia 2/2 ESRD- [facility] asked to start anticoagulation with IV heparin.   Heparin level came back undetectable this PM  5/10 AM update: HL: 0.13- subtherapeutic Hgb 7.9 >> 8.1 >> 7.9 >> 7.5 / PLT  No signs of bleeding / no issues with infusion   Goal of Therapy:  Heparin level 0.3-0.7 units/ml Monitor platelets by anticoagulation protocol: Yes   Plan:  Increase Heparin infusion to 2200 units / hr Heparin level 1300 on 5/10 Monitor daily HL, CBC, and for s/sx of bleeding   Thank you. Greta Doom BS, PharmD, BCPS Clinical Pharmacist 06/08/2022 4:30 AM  Contact: [phone] after 3  PM  "Be curious, not judgmental..." -Debbora Dus

## 2022-06-09 DIAGNOSIS — Z978 Presence of other specified devices: Secondary | ICD-10-CM | POA: Diagnosis not present

## 2022-06-09 DIAGNOSIS — E876 Hypokalemia: Secondary | ICD-10-CM | POA: Diagnosis not present

## 2022-06-09 DIAGNOSIS — N19 Unspecified kidney failure: Secondary | ICD-10-CM | POA: Diagnosis not present

## 2022-06-09 LAB — RENAL FUNCTION PANEL
Albumin: 2.7 g/dL — ABNORMAL LOW (ref 3.5–5.0)
Anion gap: 10 (ref 5–15)
BUN: 27 mg/dL — ABNORMAL HIGH (ref 8–23)
CO2: 27 mmol/L (ref 22–32)
Calcium: 7.7 mg/dL — ABNORMAL LOW (ref 8.9–10.3)
Chloride: 98 mmol/L (ref 98–111)
Creatinine, Ser: 3.68 mg/dL — ABNORMAL HIGH (ref 0.61–1.24)
GFR, Estimated: 16 mL/min — ABNORMAL LOW (ref >60)
Glucose, Bld: 102 mg/dL — ABNORMAL HIGH (ref 70–99)
Phosphorus: 2.3 mg/dL — ABNORMAL LOW (ref 2.5–4.6)
Potassium: 4.3 mmol/L (ref 3.5–5.1)
Sodium: 135 mmol/L (ref 135–145)

## 2022-06-09 LAB — CBC
HCT: 24.7 % — ABNORMAL LOW (ref 39.0–52.0)
Hemoglobin: 7.6 g/dL — ABNORMAL LOW (ref 13.0–17.0)
MCH: 29.5 pg (ref 26.0–34.0)
MCHC: 30.8 g/dL (ref 30.0–36.0)
MCV: 95.7 fL (ref 80.0–100.0)
Platelets: 151 10*3/uL (ref 150–400)
RBC: 2.58 MIL/uL — ABNORMAL LOW (ref 4.22–5.81)
RDW: 14.6 % (ref 11.5–15.5)
WBC: 7.4 10*3/uL (ref 4.0–10.5)
nRBC: 0 % (ref 0.0–0.2)

## 2022-06-09 LAB — MAGNESIUM: Magnesium: 1.8 mg/dL (ref 1.7–2.4)

## 2022-06-09 LAB — HEPARIN LEVEL (UNFRACTIONATED): Heparin Unfractionated: 0.4 IU/mL (ref 0.30–0.70)

## 2022-06-09 MED ORDER — DARBEPOETIN ALFA 100 MCG/0.5ML IJ SOSY
100.0000 ug | PREFILLED_SYRINGE | INTRAMUSCULAR | Status: DC
  Administered 2022-06-15: 100 ug via SUBCUTANEOUS
  Filled 2022-06-09: qty 0.5

## 2022-06-09 MED ORDER — SODIUM CHLORIDE 0.9 % IV SOLN
250.0000 mg | Freq: Every day | INTRAVENOUS | Status: AC
  Administered 2022-06-10 – 2022-06-11 (×2): 250 mg via INTRAVENOUS
  Filled 2022-06-09 (×2): qty 20

## 2022-06-09 MED ORDER — APIXABAN 2.5 MG PO TABS
2.5000 mg | ORAL_TABLET | Freq: Two times a day (BID) | ORAL | Status: DC
  Administered 2022-06-09 – 2022-06-20 (×22): 2.5 mg via ORAL
  Filled 2022-06-09 (×22): qty 1

## 2022-06-09 NOTE — Progress Notes (Addendum)
Rounding Note    Patient Name: Glenn Olson Date of Encounter: 06/09/2022  [facility] Cardiologist: None   Subjective   Feels ok.  No palpitations  Inpatient Medications    Scheduled Meds:  sodium chloride   Intravenous Once   calcitRIOL  0.5 mcg Oral Daily   calcium acetate  1,334 mg Oral TID WC   Chlorhexidine Gluconate Cloth  6 each Topical Q0600   [START ON 06/15/2022] darbepoetin (ARANESP) injection - DIALYSIS  100 mcg Subcutaneous Q Fri-1800   diltiazem  180 mg Oral Daily   feeding supplement (NEPRO CARB STEADY)  237 mL Oral BID BM   loratadine  10 mg Oral Daily   Continuous Infusions:  [START ON 06/10/2022] ferric gluconate (FERRLECIT) IVPB     heparin 2,400 Units/hr (06/09/22 1133)   PRN Meds: acetaminophen **OR** acetaminophen, diltiazem, guaiFENesin-dextromethorphan, HYDROmorphone (DILAUDID) injection, labetalol, melatonin, ondansetron (ZOFRAN) IV, polyethylene glycol   Vital Signs    Vitals:   06/08/22 2308 06/09/22 0304 06/09/22 0830 06/09/22 1118  BP: 114/70 118/85 110/65 121/70  Pulse: 98 100 98 (!) 106  Resp: 20 20 20 20   Temp: 98.1 F (36.7 C) 98 F (36.7 C) (!) 97.1 F (36.2 C) 99.1 F (37.3 C)  TempSrc: Oral Oral Axillary Oral  SpO2: 95% 97% 96% 97%  Weight:      Height:        Intake/Output Summary (Last 24 hours) at 06/09/2022 1222 Last data filed at 06/09/2022 1200 Gross per 24 hour  Intake 929.89 ml  Output 1850 ml  Net -920.11 ml      06/08/2022    3:47 AM 06/06/2022    5:45 PM 06/06/2022    1:56 PM  Last 3 Weights  Weight (lbs) 167 lb 8.8 oz 156 lb 8.4 oz 159 lb 2.8 oz  Weight (kg) 76 kg 71 kg 72.2 kg      Telemetry    Atrial fibrillation with intermittent rapid ventricular response- Personally Reviewed  ECG      Physical Exam   GEN: No acute distress.   Neck: No JVD Cardiac: Tachycardic, irregular, no murmurs, rubs, or gallops.  Respiratory: Clear to auscultation bilaterally. GI: Soft, nontender,  non-distended  MS: No edema; No deformity. Neuro:  Nonfocal  Psych: Normal affect   Labs    High Sensitivity Troponin:   Recent Labs  Lab 05/30/22 1445 05/30/22 1640  TROPONINIHS 48* 52*     Chemistry Recent Labs  Lab 06/07/22 0809 06/08/22 0140 06/09/22 0102  NA 134* 135 135  K 3.7 4.0 4.3  CL 98 95* 98  CO2 24 24 27   GLUCOSE 106* 111* 102*  BUN 38* 45* 27*  CREATININE 4.94* 5.54* 3.68*  CALCIUM 7.3* 7.7* 7.7*  MG 1.5* 1.8 1.8  ALBUMIN 2.9* 2.9* 2.7*  GFRNONAA 11* 10* 16*  ANIONGAP 12 16* 10    Lipids No results for input(s): "CHOL", "TRIG", "HDL", "LABVLDL", "LDLCALC", "CHOLHDL" in the last 168 hours.  Hematology Recent Labs  Lab 06/07/22 0809 06/08/22 0140 06/09/22 0102  WBC 7.8 8.1 7.4  RBC 2.65* 2.45* 2.58*  HGB 7.9* 7.5* 7.6*  HCT 24.4* 22.7* 24.7*  MCV 92.1 92.7 95.7  MCH 29.8 30.6 29.5  MCHC 32.4 33.0 30.8  RDW 14.6 14.6 14.6  PLT 132* 138* 151   Thyroid  Recent Labs  Lab 06/05/22 0251  TSH 1.494    BNPNo results for input(s): "BNP", "PROBNP" in the last 168 hours.  DDimer No results for input(s): "DDIMER"  in the last 168 hours.   Radiology    No results found.  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     For questions or updates, please contact [facility] Please consult [email or web address] for contact info under        Signed, Maurice Small, MD  06/09/2022, 12:22 PM

## 2022-06-09 NOTE — Progress Notes (Signed)
Mobility Specialist Progress Note   06/09/22 1440  Mobility  Activity Transferred from chair to bed  Level of Assistance Moderate assist, patient does 50-74%  Assistive Device  (HHA + IV)  Distance Ambulated (ft) 5 ft  Range of Motion/Exercises Active;All extremities  Activity Response Tolerated well   Patient received in recliner agreeable to participate and requesting assistance back to bed. Required mod A to stand and took steps from recliner to bed min guard. Completed bed mobility independently upon returning to supine.Tolerated without complaint or incident. Was left with all needs met, call bell in reach.   Glenn Olson, BS EXP Mobility Specialist Please contact via SecureChat or Rehab office at [phone]

## 2022-06-09 NOTE — Progress Notes (Signed)
ANTICOAGULATION CONSULT NOTE  Pharmacy Consult for IV heparin Indication: atrial fibrillation  No Known Allergies  Patient Measurements: Height: 5\' 9"  (175.3 cm) Weight: 76 kg (167 lb 8.8 oz) IBW/kg (Calculated) : 70.7 Heparin Dosing Weight: ~ 70 kg  Vital Signs: Temp: 98 F (36.7 C) (05/11 0304) Temp Source: Oral (05/11 0304) BP: 118/85 (05/11 0304) Pulse Rate: 100 (05/11 0304)  Labs: Recent Labs    06/06/22 2017 06/07/22 0804 06/07/22 0809 06/07/22 1756 06/08/22 0140 06/08/22 1257 06/09/22 0102  HGB   < >  --  7.9*  --  7.5*  --  7.6*  HCT  --   --  24.4*  --  22.7*  --  24.7*  PLT  --   --  132*  --  138*  --  151  APTT  --  81*  --   --   --   --   --   HEPARINUNFRC  --   --  <0.10*   < > 0.13* 0.21* 0.40  CREATININE  --   --  4.94*  --  5.54*  --  3.68*   < > = values in this interval not displayed.     Estimated Creatinine Clearance: 16 mL/min (A) (by C-G formula based on SCr of 3.68 mg/dL (H)).   Medical History: Past Medical History:  Diagnosis Date   Anemia    Chronic kidney disease    Dysrhythmia    GERD (gastroesophageal reflux disease)    Neurogenic bladder     Medications:  Infusions:   ferric gluconate (FERRLECIT) IVPB Stopped (06/08/22 1853)   heparin 2,400 Units/hr (06/09/22 0524)    Assessment: 82 yo male with new afib. Patient has chronic anemia 2/2 ESRD- HD on [facility] asked to start anticoagulation with IV heparin.   Heparin level therapeutic at 0.40. No issues with heparin infusion or s/sx bleeding noted. Hgb 7.6, PLT 151 stable.   Goal of Therapy:  Heparin level 0.3-0.7 units/ml Monitor platelets by anticoagulation protocol: Yes   Plan:  Continue heparin at 2,400 units/hr  Daily heparin level, CBC Monitor for s/sx bleeding Follow plans for oral anticoagulation   Larena Sox, PharmD PGY1 Pharmacy Resident   06/09/2022  8:04 AM

## 2022-06-09 NOTE — Discharge Instructions (Signed)
Information on my medicine - ELIQUIS (apixaban)  Why was Eliquis prescribed for you? Eliquis was prescribed for you to reduce the risk of a blood clot forming that can cause a stroke if you have a medical condition called atrial fibrillation (a type of irregular heartbeat).  What do You need to know about Eliquis ? Take your Eliquis TWICE DAILY - one tablet in the morning and one tablet in the evening with or without food. If you have difficulty swallowing the tablet whole please discuss with your pharmacist how to take the medication safely.  Take Eliquis exactly as prescribed by your doctor and DO NOT stop taking Eliquis without talking to the doctor who prescribed the medication.  Stopping may increase your risk of developing a stroke.  Refill your prescription before you run out.  After discharge, you should have regular check-up appointments with your healthcare provider that is prescribing your Eliquis.  In the future your dose may need to be changed if your kidney function or weight changes by a significant amount or as you get older.  What do you do if you miss a dose? If you miss a dose, take it as soon as you remember on the same day and resume taking twice daily.  Do not take more than one dose of ELIQUIS at the same time to make up a missed dose.  Important Safety Information A possible side effect of Eliquis is bleeding. You should call your healthcare provider right away if you experience any of the following: Bleeding from an injury or your nose that does not stop. Unusual colored urine (red or dark brown) or unusual colored stools (red or black). Unusual bruising for unknown reasons. A serious fall or if you hit your head (even if there is no bleeding).  Some medicines may interact with Eliquis and might increase your risk of bleeding or clotting while on Eliquis. To help avoid this, consult your healthcare provider or pharmacist prior to using any new prescription or  non-prescription medications, including herbals, vitamins, non-steroidal anti-inflammatory drugs (NSAIDs) and supplements.  This website has more information on Eliquis (apixaban): http://www.eliquis.com/eliquis/home  =================================================================  Atrial Fibrillation    Atrial fibrillation is a type of heartbeat that is irregular or fast. If you have this condition, your heart beats without any order. This makes it hard for your heart to pump blood in a normal way. Atrial fibrillation may come and go, or it may become a long-lasting problem. If this condition is not treated, it can put you at higher risk for stroke, heart failure, and other heart problems.  What are the causes? This condition may be caused by diseases that damage the heart. They include: High blood pressure. Heart failure. Heart valve disease. Heart surgery. Other causes include: Diabetes. Thyroid disease. Being overweight. Kidney disease. Sometimes the cause is not known.  What increases the risk? You are more likely to develop this condition if: You are older. You smoke. You exercise often and very hard. You have a family history of this condition. You are a man. You use drugs. You drink a lot of alcohol. You have lung conditions, such as emphysema, pneumonia, or COPD. You have sleep apnea.  What are the signs or symptoms? Common symptoms of this condition include: A feeling that your heart is beating very fast. Chest pain or discomfort. Feeling short of breath. Suddenly feeling light-headed or weak. Getting tired easily during activity. Fainting. Sweating. In some cases, there are no symptoms.  How is   this treated? Treatment for this condition depends on underlying conditions and how you feel when you have atrial fibrillation. They include: Medicines to: Prevent blood clots. Treat heart rate or heart rhythm problems. Using devices, such as a pacemaker, to  correct heart rhythm problems. Doing surgery to remove the part of the heart that sends bad signals. Closing an area where clots can form in the heart (left atrial appendage). In some cases, your doctor will treat other underlying conditions.  Follow these instructions at home:  Medicines Take over-the-counter and prescription medicines only as told by your doctor. Do not take any new medicines without first talking to your doctor. If you are taking blood thinners: Talk with your doctor before you take any medicines that have aspirin or NSAIDs, such as ibuprofen, in them. Take your medicine exactly as told by your doctor. Take it at the same time each day. Avoid activities that could hurt or bruise you. Follow instructions about how to prevent falls. Wear a bracelet that says you are taking blood thinners. Or, carry a card that lists what medicines you take. Lifestyle         Do not use any products that have nicotine or tobacco in them. These include cigarettes, e-cigarettes, and chewing tobacco. If you need help quitting, ask your doctor. Eat heart-healthy foods. Talk with your doctor about the right eating plan for you. Exercise regularly as told by your doctor. Do not drink alcohol. Lose weight if you are overweight. Do not use drugs, including cannabis.  General instructions If you have a condition that causes breathing to stop for a short period of time (apnea), treat it as told by your doctor. Keep a healthy weight. Do not use diet pills unless your doctor says they are safe for you. Diet pills may make heart problems worse. Keep all follow-up visits as told by your doctor. This is important.  Contact a doctor if: You notice a change in the speed, rhythm, or strength of your heartbeat. You are taking a blood-thinning medicine and you get more bruising. You get tired more easily when you move or exercise. You have a sudden change in weight.  Get help right away if:     You have pain in your chest or your belly (abdomen). You have trouble breathing. You have side effects of blood thinners, such as blood in your vomit, poop (stool), or pee (urine), or bleeding that cannot stop. You have any signs of a stroke. "BE FAST" is an easy way to remember the main warning signs: B - Balance. Signs are dizziness, sudden trouble walking, or loss of balance. E - Eyes. Signs are trouble seeing or a change in how you see. F - Face. Signs are sudden weakness or loss of feeling in the face, or the face or eyelid drooping on one side. A - Arms. Signs are weakness or loss of feeling in an arm. This happens suddenly and usually on one side of the body. S - Speech. Signs are sudden trouble speaking, slurred speech, or trouble understanding what people say. T - Time. Time to call emergency services. Write down what time symptoms started. You have other signs of a stroke, such as: A sudden, very bad headache with no known cause. Feeling like you may vomit (nausea). Vomiting. A seizure.  These symptoms may be an emergency. Do not wait to see if the symptoms will go away. Get medical help right away. Call your local emergency services (911 in the   [place] Do not drive yourself to the hospital. Summary Atrial fibrillation is a type of heartbeat that is irregular or fast. You are at higher risk of this condition if you smoke, are older, have diabetes, or are overweight. Follow your doctor's instructions about medicines, diet, exercise, and follow-up visits. Get help right away if you have signs or symptoms of a stroke. Get help right away if you cannot catch your breath, or you have chest pain or discomfort. This information is not intended to replace advice given to you by your health care provider. Make sure you discuss any questions you have with your health care provider. Document Revised: 07/09/2018 Document Reviewed: 07/09/2018 Elsevier Patient Education  2020 [employer]      

## 2022-06-09 NOTE — Progress Notes (Signed)
ANTICOAGULATION CONSULT NOTE  Pharmacy Consult for IV heparin Indication: atrial fibrillation  No Known Allergies  Patient Measurements: Height: 5\' 9"  (175.3 cm) Weight: 76 kg (167 lb 8.8 oz) IBW/kg (Calculated) : 70.7 Heparin Dosing Weight: ~ 70 kg  Vital Signs: Temp: 99.1 F (37.3 C) (05/11 1118) Temp Source: Oral (05/11 1118) BP: 121/70 (05/11 1118) Pulse Rate: 106 (05/11 1118)  Labs: Recent Labs    06/06/22 2017 06/07/22 0804 06/07/22 0809 06/07/22 1756 06/08/22 0140 06/08/22 1257 06/09/22 0102  HGB   < >  --  7.9*  --  7.5*  --  7.6*  HCT  --   --  24.4*  --  22.7*  --  24.7*  PLT  --   --  132*  --  138*  --  151  APTT  --  81*  --   --   --   --   --   HEPARINUNFRC  --   --  <0.10*   < > 0.13* 0.21* 0.40  CREATININE  --   --  4.94*  --  5.54*  --  3.68*   < > = values in this interval not displayed.     Estimated Creatinine Clearance: 16 mL/min (A) (by C-G formula based on SCr of 3.68 mg/dL (H)).   Medical History: Past Medical History:  Diagnosis Date   Anemia    Chronic kidney disease    Dysrhythmia    GERD (gastroesophageal reflux disease)    Neurogenic bladder     Medications:  Infusions:   [START ON 06/10/2022] ferric gluconate (FERRLECIT) IVPB     heparin 2,400 Units/hr (06/09/22 1412)    Assessment: 82 yo male with new afib. 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 Pharmacy asked to start anticoagulation with IV heparin.   Recently got therapeutic on heparin infusion this morning. Plan to change to apixaban. Given age 65, ESRD, despite weight>60kg, would qualify for reduced dose apixaban. Hgb 7.6, plt 151. No s/sx of bleeding.    Goal of Therapy:  Heparin level 0.3-0.7 units/ml Monitor platelets by anticoagulation protocol: Yes   Plan:  Stop heparin infusion Start apixaban 2.5 mg twice daily Monitor for s/sx bleeding  Thank you for allowing pharmacy to participate in this patient's care,  Sherron Monday, PharmD,  BCCCP Clinical Pharmacist  Phone: [phone] 06/09/2022 3:47 PM  Please check AMION for all [facility] phone numbers After 10:00 PM, call Main Pharmacy [phone]

## 2022-06-09 NOTE — Progress Notes (Signed)
PROGRESS NOTE  Glenn Olson ZOX:[id number] DOB: 1940/03/03   PCP: Benita Stabile, MD  Patient is from: Home  DOA: 05/30/2022 LOS: 10  Chief complaints Chief Complaint  Patient presents with   Headache     Brief Narrative / Interim history: 82 y.o. male with medical history significant for ESRD, neurogenic atonic bladder presented to [facility] with headache and chest pain for 1 week.  Patient was on hemodialysis but had stopped going for hemodialysis for 1 year now.  Has been having poor oral intake with nausea and vomiting.  In the ED, patient was mildly tachypneic and tachycardic with elevated blood pressure.  Chest x-ray with increased left lower lower lobe markings. CO2 less than 7.  BUN of 241.  Potassium 4.5.  Cr 13.8.Marland KitchenTroponin 48 > 52.  Nephrology was consulted and decision was to proceed with hemodialysis.  Patient was then transferred to [facility].   Patient has had HD cath placed and started on hemodialysis.  Encephalopathy improving.  Hospital course complicated by A-fib with RVR.  He was started on p.o. Cardizem and IV heparin.  TTE without significant finding.  RVR improved.  Cardiology titrating p.o. Cardizem.  Therapy recommended CIR.  CIR following.    Subjective: Seen and examined earlier this morning.  No major events overnight of this morning.  No complaints.  HR improved, in the range of 90-1 110s.  Blood pressure stable.  Tolerated HD.  Encephalopathy resolved.  Objective: Vitals:   06/09/22 0304 06/09/22 0830 06/09/22 1118 06/09/22 1119  BP: 118/85 110/65 121/70   Pulse: 100 98 (!) 106   Resp: 20 20 20 19   Temp: 98 F (36.7 C) (!) 97.1 F (36.2 C) 99.1 F (37.3 C)   TempSrc: Oral Axillary Oral   SpO2: 97% 96% 97%   Weight:      Height:        Examination:  GENERAL: No apparent distress.  Appears frail. HEENT: MMM.  Vision and hearing grossly intact.  NECK: Supple.  No apparent JVD.  RESP:  No IWOB.  Fair aeration  bilaterally. CVS: Irregular rhythm.  HR ranges from 90s to 110s.  Heart sounds normal.  ABD/GI/GU: BS+. Abd soft, NTND.  MSK/EXT:   No apparent deformity. Moves extremities. No edema.  SKIN: no apparent skin lesion or wound NEURO: Awake and alert. Oriented x 4.  No apparent focal neuro deficit. PSYCH: Calm. Normal affect.   Procedures:  HD catheter placed  Microbiology summarized: MRSA PCR screen nonreactive.  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 Has had HD cath placed and started on dialysis.  Encephalopathy resolved.  Oriented x 4. -HD per nephrology. -Reorientation and delirium precaution  New onset a flutter/A-fib with RVR: TTE without significant finding.  HR improved, 90-110. -Care per cardiology-Cardizem CD to 180 mg daily with as needed p.o. Cardizem -Transition to p.o. Eliquis. -Optimize electrolytes   Hypokalemia/hypocalcemia/hypomagnesemia -Monitor replenish as appropriate   Atonic neurogenic bladder -Continue suprapubic catheter   Anemia of chronic kidney disease: Relatively stable after 1 unit. Recent Labs    05/31/22 1117 06/01/22 8119 06/02/22 0411 06/03/22 1478 06/04/22 2956 06/05/22 0247 06/06/22 0053 06/07/22  0809 06/08/22 0140 06/09/22 0102  HGB 7.5* 6.7* 7.6* 7.1* 8.1* 7.9* 8.1* 7.9* 7.5* 7.6*  -Continue monitoring -IV iron on MWF per nephrology   Deconditioning, debility: Improved. -Therapy recommended CIR.  CIR following.  Thrombocytopenia: Resolved.  Body mass index is 24.74 kg/m. Nutrition Problem: Inadequate oral intake Etiology: poor appetite Signs/Symptoms: meal completion < 25% Interventions: Nepro shake    DVT prophylaxis:  SCDs Start: 05/30/22 1926 On full dose anticoagulation  Code Status: DNR/DNI Family Communication: None at bedside Level of care: Progressive Status is: Inpatient Remains inpatient appropriate because: Safe disposition.   Final disposition: CIR.  CIR following. Consultants:  Nephrology Cardiology  35 minutes with more than 50% spent in reviewing records, counseling patient/family and coordinating care.   Sch Meds:  Scheduled Meds:  sodium chloride   Intravenous Once   calcitRIOL  0.5 mcg Oral Daily   calcium acetate  1,334 mg Oral TID WC   Chlorhexidine Gluconate Cloth  6 each Topical Q0600   [START ON 06/15/2022] darbepoetin (ARANESP) injection - DIALYSIS  100 mcg Subcutaneous Q Fri-1800   diltiazem  180 mg Oral Daily   feeding supplement (NEPRO CARB STEADY)  237 mL Oral BID BM   loratadine  10 mg Oral Daily   Continuous Infusions:  [START ON 06/10/2022] ferric gluconate (FERRLECIT) IVPB     heparin 2,400 Units/hr (06/09/22 1412)   PRN Meds:.acetaminophen **OR** acetaminophen, diltiazem, guaiFENesin-dextromethorphan, HYDROmorphone (DILAUDID) injection, labetalol, melatonin, ondansetron (ZOFRAN) IV, polyethylene glycol  Antimicrobials: Anti-infectives (From admission, onward)    Start     Dose/Rate Route Frequency Ordered Stop   05/31/22 1115  ceFAZolin (ANCEF) IVPB 2g/100 mL premix        2 g 200 mL/hr over 30 Minutes Intravenous  Once 05/31/22 1108 05/31/22 1200   05/31/22 1112  ceFAZolin (ANCEF) 2-4 GM/100ML-% IVPB       Note to Pharmacy: Shanda Bumps M: cabinet override      05/31/22 1112 05/31/22 1206        I have personally reviewed the following labs and images: CBC: Recent Labs  Lab 06/05/22 0247 06/06/22 0053 06/07/22 0809 06/08/22 0140 06/09/22 0102  WBC 8.6 9.2 7.8 8.1 7.4  HGB 7.9* 8.1* 7.9* 7.5* 7.6*  HCT 23.5* 25.8* 24.4* 22.7* 24.7*  MCV 90.0 92.1 92.1 92.7 95.7  PLT 145* 149* 132* 138* 151   BMP &GFR Recent Labs   Lab 06/05/22 0247 06/05/22 0251 06/06/22 0053 06/07/22 0809 06/08/22 0140 06/09/22 0102  NA  --  140 135 134* 135 135  K  --  3.1* 3.8 3.7 4.0 4.3  CL  --  94* 94* 98 95* 98  CO2  --  27 24 24 24 27   GLUCOSE  --  96 127* 106* 111* 102*  BUN  --  81* 87* 38* 45* 27*  CREATININE  --  8.21* 8.13* 4.94* 5.54* 3.68*  CALCIUM  --  6.6* 6.9* 7.3* 7.7* 7.7*  MG 1.6*  --  2.1 1.5* 1.8 1.8  PHOS  --  5.8* 4.8* 3.1 3.2 2.3*   Estimated Creatinine Clearance: 16 mL/min (A) (by C-G formula based on SCr of 3.68 mg/dL (H)). Liver & Pancreas: Recent Labs  Lab 06/05/22 0251 06/06/22 0053 06/07/22 0809 06/08/22 0140 06/09/22 0102  ALBUMIN 2.7* 3.0* 2.9* 2.9* 2.7*   No results for input(s): "LIPASE", "AMYLASE" in the last 168 hours. No results for input(s): "AMMONIA" in the last 168 hours. Diabetic: No results for input(s): "HGBA1C" in the last 72 hours.  No results for input(s): "GLUCAP" in the last 168 hours.  Cardiac Enzymes: No results for input(s): "CKTOTAL", "CKMB", "CKMBINDEX", "TROPONINI" in the last 168 hours. No results for input(s): "PROBNP" in the last 8760 hours. Coagulation Profile: No results for input(s): "INR", "PROTIME" in the last 168 hours. Thyroid Function Tests: No results for input(s): "TSH", "T4TOTAL", "FREET4", "T3FREE", "THYROIDAB" in the last 72 hours.  Lipid Profile: No results for input(s): "CHOL", "HDL", "LDLCALC", "TRIG", "CHOLHDL", "LDLDIRECT" in the last 72 hours. Anemia Panel: No results for input(s): "VITAMINB12", "FOLATE", "FERRITIN", "TIBC", "IRON", "RETICCTPCT" in the last 72 hours. Urine analysis:    Component Value Date/Time   COLORURINE YELLOW 11/29/2020 1705   APPEARANCEUR CLOUDY (A) 11/29/2020 1705   LABSPEC 1.006 11/29/2020 1705   PHURINE 8.0 11/29/2020 1705   GLUCOSEU NEGATIVE 11/29/2020 1705   HGBUR SMALL (A) 11/29/2020 1705   BILIRUBINUR NEGATIVE 11/29/2020 1705   KETONESUR NEGATIVE 11/29/2020 1705   PROTEINUR 100 (A) 11/29/2020  1705   NITRITE NEGATIVE 11/29/2020 1705   LEUKOCYTESUR LARGE (A) 11/29/2020 1705   Sepsis Labs: Invalid input(s): "PROCALCITONIN", "LACTICIDVEN"  Microbiology: Recent Results (from the past 240 hour(s))  MRSA Next Gen by PCR, Nasal     Status: None   Collection Time: 06/04/22  8:36 AM   Specimen: Nasal Mucosa; Nasal Swab  Result Value Ref Range Status   MRSA by PCR Next Gen NOT DETECTED NOT DETECTED Final    Comment: (NOTE) The GeneXpert MRSA Assay (FDA approved for NASAL specimens only), is one component of a comprehensive MRSA colonization surveillance program. It is not intended to diagnose MRSA infection nor to guide or monitor treatment for MRSA infections. Test performance is not FDA approved in patients less than 2 years old. Performed at [facility], [address], [place], [place] [address]     Radiology Studies: No results found.    Rylann Munford T. Hawley Michel [facility]  If 7PM-7AM, please contact night-coverage [email or web address] 06/09/2022, 3:38 PM

## 2022-06-09 NOTE — Plan of Care (Signed)
  Problem: Clinical Measurements: Goal: Will remain free from infection Outcome: Progressing   Problem: Health Behavior/Discharge Planning: Goal: Ability to manage health-related needs will improve Outcome: Not Progressing   

## 2022-06-09 NOTE — Progress Notes (Signed)
Nephrology Follow-Up Consult note   Assessment/Recommendations: Glenn Olson is a/an 82 y.o. male with a past medical history significant for ESRD but came off dialysis for some time, atonic bladder,, admitted for AKI on CKD with uremia.       ESRD: History of such and was off dialysis for some time. Looked into PD but not an option given adhesions. Now ESRD again and agreeing to dialysis -Continue with dialysis MWF -Outpatient dialysis arranged at [facility] but waiting for formal discharge plan given possibly going to CIR -[facility] in place with [facility].  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 Randie Heinz assessment from 03/17/21. Will need to f/u with [facility] for further plans regarding access in the future.  Atrial fibrillation with RVR: Persistent tachycardia.  Management per primary team  Anemia of CKD: Persistently low hemoglobin.  Iron depleted and IV iron as ordered.  Increase ESA due on Friday.  Hypertension: Blood pressure acceptable on current medications.  Secondary hyperparathyroidism: With hypocalcemia and hyperphosphatemia.  On PhosLo.  Start calcitriol 0.5 mcg daily.  Continue to monitor     Recommendations conveyed to primary service.    Darnell Level [facility] 06/09/2022 10:00 AM  ___________________________________________________________  CC: AKI on CKD  Interval History/Subjective: Patient states he tolerated dialysis yesterday with no complaints.  Persistent tachycardia.  No other issues   Medications:  Current Facility-Administered Medications  Medication Dose Route Frequency Provider Last Rate Last Admin   0.9 %  sodium chloride infusion (Manually program via Guardrails IV Fluids)   Intravenous Once Almon Hercules, MD       acetaminophen (TYLENOL) tablet 650 mg  650 mg Oral Q6H PRN Candelaria Stagers T, MD   650 mg at 06/01/22 6962   Or   acetaminophen (TYLENOL) suppository 650 mg  650 mg Rectal Q6H PRN Candelaria Stagers T, MD        calcitRIOL (ROCALTROL) capsule 0.5 mcg  0.5 mcg Oral Daily Candelaria Stagers T, MD   0.5 mcg at 06/09/22 0813   calcium acetate (PHOSLO) capsule 1,334 mg  1,334 mg Oral TID WC Candelaria Stagers T, MD   1,334 mg at 06/09/22 0813   Chlorhexidine Gluconate Cloth 2 % PADS 6 each  6 each Topical Q0600 Almon Hercules, MD   6 each at 06/09/22 0634   Darbepoetin Alfa (ARANESP) injection 40 mcg  40 mcg Subcutaneous Q Fri-1800 Candelaria Stagers T, MD   40 mcg at 06/08/22 1930   diltiazem (CARDIZEM CD) 24 hr capsule 180 mg  180 mg Oral Daily Corky Crafts, MD   180 mg at 06/09/22 0813   diltiazem (CARDIZEM) tablet 30 mg  30 mg Oral QID PRN Corky Crafts, MD   30 mg at 06/08/22 1228   feeding supplement (NEPRO CARB STEADY) liquid 237 mL  237 mL Oral BID BM Gonfa, Taye T, MD   237 mL at 06/07/22 1234   ferric gluconate (FERRLECIT) 125 mg in sodium chloride 0.9 % 100 mL IVPB  125 mg Intravenous Q M,W,F-HD Almon Hercules, MD   Stopped at 06/08/22 1853   guaiFENesin-dextromethorphan (ROBITUSSIN DM) 100-10 MG/5ML syrup 5 mL  5 mL Oral Q4H PRN Candelaria Stagers T, MD   5 mL at 06/08/22 2108   heparin ADULT infusion 100 units/mL (25000 units/261mL)  2,400 Units/hr Intravenous Continuous Gardner Candle, RPH 24 mL/hr at 06/09/22 0524 2,400 Units/hr at 06/09/22 0524   HYDROmorphone (DILAUDID) injection 0.5 mg  0.5 mg Intravenous Q4H PRN Candelaria Stagers T, MD   0.5 mg at 06/01/22 1647   labetalol (NORMODYNE) injection 10 mg  10 mg Intravenous Q2H PRN Candelaria Stagers T, MD   10 mg at 05/30/22 2045   loratadine (CLARITIN) tablet 10 mg  10 mg Oral Daily Candelaria Stagers T, MD   10 mg at 06/09/22 0813   melatonin tablet 3 mg  3 mg Oral QHS PRN Candelaria Stagers T, MD   3 mg at 06/08/22 0030   ondansetron (ZOFRAN) injection 4 mg  4 mg Intravenous Q6H PRN Candelaria Stagers T, MD   4 mg at 06/08/22 0753   polyethylene glycol (MIRALAX / GLYCOLAX) packet 17 g  17 g Oral Daily PRN Almon Hercules, MD          Review of Systems: 10 systems reviewed and negative  except per interval history/subjective  Physical Exam: Vitals:   06/09/22 0304 06/09/22 0830  BP: 118/85 110/65  Pulse: 100 98  Resp: 20 20  Temp: 98 F (36.7 C) (!) 97.1 F (36.2 C)  SpO2: 97% 96%   Total I/O In: 240 [P.O.:240] Out: -   Intake/Output Summary (Last 24 hours) at 06/09/2022 1000 Last data filed at 06/09/2022 0830 Gross per 24 hour  Intake 932.49 ml  Output 1750 ml  Net -817.51 ml   Constitutional: Chronically ill-appearing, lying in bed, no distress ENMT: ears and nose without scars or lesions, MMM CV: Tachycardic, regular rhythm, no edema Respiratory: Bilateral chest rise, normal work of breathing Gastrointestinal: soft, non-tender, no palpable masses or hernias Skin: no visible lesions or rashes   Test Results I personally reviewed new and old clinical labs and radiology tests Lab Results  Component Value Date   NA 135 06/09/2022   K 4.3 06/09/2022   CL 98 06/09/2022   CO2 27 06/09/2022   BUN 27 (H) 06/09/2022   CREATININE 3.68 (H) 06/09/2022   CALCIUM 7.7 (L) 06/09/2022   ALBUMIN 2.7 (L) 06/09/2022   PHOS 2.3 (L) 06/09/2022    CBC Recent Labs  Lab 06/07/22 0809 06/08/22 0140 06/09/22 0102  WBC 7.8 8.1 7.4  HGB 7.9* 7.5* 7.6*  HCT 24.4* 22.7* 24.7*  MCV 92.1 92.7 95.7  PLT 132* 138* 151

## 2022-06-10 DIAGNOSIS — N19 Unspecified kidney failure: Secondary | ICD-10-CM | POA: Diagnosis not present

## 2022-06-10 DIAGNOSIS — E876 Hypokalemia: Secondary | ICD-10-CM | POA: Diagnosis not present

## 2022-06-10 DIAGNOSIS — Z978 Presence of other specified devices: Secondary | ICD-10-CM | POA: Diagnosis not present

## 2022-06-10 LAB — RENAL FUNCTION PANEL
Albumin: 2.7 g/dL — ABNORMAL LOW (ref 3.5–5.0)
Anion gap: 10 (ref 5–15)
BUN: 45 mg/dL — ABNORMAL HIGH (ref 8–23)
CO2: 25 mmol/L (ref 22–32)
Calcium: 8.1 mg/dL — ABNORMAL LOW (ref 8.9–10.3)
Chloride: 98 mmol/L (ref 98–111)
Creatinine, Ser: 5.3 mg/dL — ABNORMAL HIGH (ref 0.61–1.24)
GFR, Estimated: 10 mL/min — ABNORMAL LOW (ref >60)
Glucose, Bld: 101 mg/dL — ABNORMAL HIGH (ref 70–99)
Phosphorus: 2.8 mg/dL (ref 2.5–4.6)
Potassium: 4.7 mmol/L (ref 3.5–5.1)
Sodium: 133 mmol/L — ABNORMAL LOW (ref 135–145)

## 2022-06-10 LAB — CBC
HCT: 22.8 % — ABNORMAL LOW (ref 39.0–52.0)
Hemoglobin: 7 g/dL — ABNORMAL LOW (ref 13.0–17.0)
MCH: 29.2 pg (ref 26.0–34.0)
MCHC: 30.7 g/dL (ref 30.0–36.0)
MCV: 95 fL (ref 80.0–100.0)
Platelets: 154 10*3/uL (ref 150–400)
RBC: 2.4 MIL/uL — ABNORMAL LOW (ref 4.22–5.81)
RDW: 14.7 % (ref 11.5–15.5)
WBC: 7.1 10*3/uL (ref 4.0–10.5)
nRBC: 0 % (ref 0.0–0.2)

## 2022-06-10 LAB — MAGNESIUM: Magnesium: 1.8 mg/dL (ref 1.7–2.4)

## 2022-06-10 MED ORDER — CALCIUM ACETATE (PHOS BINDER) 667 MG PO CAPS
667.0000 mg | ORAL_CAPSULE | Freq: Three times a day (TID) | ORAL | Status: DC
  Administered 2022-06-10 – 2022-06-20 (×26): 667 mg via ORAL
  Filled 2022-06-10 (×26): qty 1

## 2022-06-10 MED ORDER — ORAL CARE MOUTH RINSE: 15.0000 mL | OROMUCOSAL | Status: DC | PRN

## 2022-06-10 NOTE — Plan of Care (Signed)
  Problem: Clinical Measurements: Goal: Respiratory complications will improve Outcome: Progressing   Problem: Activity: Goal: Risk for activity intolerance will decrease Outcome: Progressing   

## 2022-06-10 NOTE — Progress Notes (Signed)
Nephrology Follow-Up Consult note   Assessment/Recommendations: Glenn Olson is a/an 82 y.o. male with a past medical history significant for ESRD but came off dialysis for some time, atonic bladder,, admitted for AKI on CKD with uremia.       ESRD: History of such and was off dialysis for some time. Looked into PD but not an option given adhesions. Now ESRD again and agreeing to dialysis -Continue with dialysis MWF -Outpatient dialysis arranged at [facility] but waiting for formal discharge plan given possibly going to [facility] -[facility] in place with [facility].  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 Randie Heinz assessment from 03/17/21. Will need to f/u with [facility] for further plans regarding access in the future.  Atrial fibrillation with RVR: Persistent tachycardia.  Management per primary team  Anemia of CKD: Persistently low hemoglobin.  Iron depleted and IV iron as ordered.  Increase ESA due on Friday.  Hypertension: Blood pressure acceptable on current medications.  Secondary hyperparathyroidism: With hypocalcemia and hyperphosphatemia.  On PhosLo.  Started calcitriol 0.5 mcg daily.  Ca and phos much improved. Decrease phosloContinue to monitor     Recommendations conveyed to primary service.    Darnell Level [facility] 06/10/2022 9:21 AM  ___________________________________________________________  CC: AKI on CKD  Interval History/Subjective: Patient sitting in bed with no complaints.   Medications:  Current Facility-Administered Medications  Medication Dose Route Frequency Provider Last Rate Last Admin   0.9 %  sodium chloride infusion (Manually program via Guardrails IV Fluids)   Intravenous Once Almon Hercules, MD       acetaminophen (TYLENOL) tablet 650 mg  650 mg Oral Q6H PRN Candelaria Stagers T, MD   650 mg at 06/01/22 1610   Or   acetaminophen (TYLENOL) suppository 650 mg  650 mg Rectal Q6H PRN Almon Hercules, MD       apixaban (ELIQUIS)  tablet 2.5 mg  2.5 mg Oral BID Kendal Hymen, RPH   2.5 mg at 06/09/22 1557   calcitRIOL (ROCALTROL) capsule 0.5 mcg  0.5 mcg Oral Daily Candelaria Stagers T, MD   0.5 mcg at 06/09/22 0813   calcium acetate (PHOSLO) capsule 1,334 mg  1,334 mg Oral TID WC Candelaria Stagers T, MD   1,334 mg at 06/09/22 1557   Chlorhexidine Gluconate Cloth 2 % PADS 6 each  6 each Topical Q0600 Almon Hercules, MD   6 each at 06/10/22 0608   [START ON 06/15/2022] Darbepoetin Alfa (ARANESP) injection 100 mcg  100 mcg Subcutaneous Q Fri-1800 Darnell Level, MD       diltiazem (CARDIZEM CD) 24 hr capsule 180 mg  180 mg Oral Daily Corky Crafts, MD   180 mg at 06/09/22 0813   diltiazem (CARDIZEM) tablet 30 mg  30 mg Oral QID PRN Corky Crafts, MD   30 mg at 06/08/22 1228   feeding supplement (NEPRO CARB STEADY) liquid 237 mL  237 mL Oral BID BM Gonfa, Taye T, MD   237 mL at 06/07/22 1234   ferric gluconate (FERRLECIT) 250 mg in sodium chloride 0.9 % 250 mL IVPB  250 mg Intravenous Daily Darnell Level, MD       guaiFENesin-dextromethorphan (ROBITUSSIN DM) 100-10 MG/5ML syrup 5 mL  5 mL Oral Q4H PRN Candelaria Stagers T, MD   5 mL at 06/09/22 1955   HYDROmorphone (DILAUDID) injection 0.5 mg  0.5 mg Intravenous Q4H PRN Almon Hercules, MD  0.5 mg at 06/01/22 1647   labetalol (NORMODYNE) injection 10 mg  10 mg Intravenous Q2H PRN Candelaria Stagers T, MD   10 mg at 05/30/22 2045   loratadine (CLARITIN) tablet 10 mg  10 mg Oral Daily Candelaria Stagers T, MD   10 mg at 06/09/22 0813   melatonin tablet 3 mg  3 mg Oral QHS PRN Candelaria Stagers T, MD   3 mg at 06/08/22 0030   ondansetron (ZOFRAN) injection 4 mg  4 mg Intravenous Q6H PRN Candelaria Stagers T, MD   4 mg at 06/08/22 0753   polyethylene glycol (MIRALAX / GLYCOLAX) packet 17 g  17 g Oral Daily PRN Almon Hercules, MD          Review of Systems: 10 systems reviewed and negative except per interval history/subjective  Physical Exam: Vitals:   06/10/22 0542 06/10/22 0746  BP: 123/81 116/69   Pulse: 84 (!) 105  Resp: 20 20  Temp: 98.3 F (36.8 C) 97.8 F (36.6 C)  SpO2: 98% 96%   No intake/output data recorded.  Intake/Output Summary (Last 24 hours) at 06/10/2022 1610 Last data filed at 06/10/2022 0636 Gross per 24 hour  Intake 214.51 ml  Output 1050 ml  Net -835.49 ml   Constitutional:  lying in bed, no distress ENMT: ears and nose without scars or lesions, MMM CV: Tachycardic, regular rhythm, no edema Respiratory: Bilateral chest rise, normal work of breathing Gastrointestinal: soft, non-tender, no palpable masses or hernias Skin: no visible lesions or rashes   Test Results I personally reviewed new and old clinical labs and radiology tests Lab Results  Component Value Date   NA 133 (L) 06/10/2022   K 4.7 06/10/2022   CL 98 06/10/2022   CO2 25 06/10/2022   BUN 45 (H) 06/10/2022   CREATININE 5.30 (H) 06/10/2022   CALCIUM 8.1 (L) 06/10/2022   ALBUMIN 2.7 (L) 06/10/2022   PHOS 2.8 06/10/2022    CBC Recent Labs  Lab 06/08/22 0140 06/09/22 0102 06/10/22 0112  WBC 8.1 7.4 7.1  HGB 7.5* 7.6* 7.0*  HCT 22.7* 24.7* 22.8*  MCV 92.7 95.7 95.0  PLT 138* 151 154

## 2022-06-10 NOTE — Progress Notes (Signed)
Mobility Specialist Progress Note   06/10/22 1035  Mobility  Activity Transferred from bed to chair;Ambulated with assistance in room  Level of Assistance Moderate assist, patient does 50-74%  Assistive Device None (HHA)  Distance Ambulated (ft) 5 ft  Range of Motion/Exercises Active;All extremities  Activity Response Tolerated well   Patient received in supine and agreeable to participate. Deferred ambulation but agreed to sit in recliner chair. Completed bed mobility independently, then stood and took minimal steps to recliner with min-mod HHA. Tolerated without complaint or incident. Was left in recliner with all needs met, call bell in reach.   Glenn Olson, BS EXP Mobility Specialist Please contact via SecureChat or Rehab office at [phone]

## 2022-06-10 NOTE — Progress Notes (Signed)
PROGRESS NOTE  Glenn Olson ZOX:[id number] DOB: 1940-05-15   PCP: Benita Stabile, MD  Patient is from: Home  DOA: 05/30/2022 LOS: 11  Chief complaints Chief Complaint  Patient presents with   Headache     Brief Narrative / Interim history: 82 y.o. male with medical history significant for ESRD, neurogenic atonic bladder presented to [facility] with headache and chest pain for 1 week.  Patient was on hemodialysis but had stopped going for hemodialysis for 1 year now.  Has been having poor oral intake with nausea and vomiting.  In the ED, patient was mildly tachypneic and tachycardic with elevated blood pressure.  Chest x-ray with increased left lower lower lobe markings. CO2 less than 7.  BUN of 241.  Potassium 4.5.  Cr 13.8.Marland KitchenTroponin 48 > 52.  Nephrology was consulted and decision was to proceed with hemodialysis.  Patient was then transferred to [facility].   Patient has had HD cath placed and started on hemodialysis.  Encephalopathy improving.  Hospital course complicated by A-fib with RVR.  He was started on p.o. Cardizem and IV heparin.  TTE without significant finding.  RVR improved.  Cardiology titrating p.o. Cardizem.  Therapy recommended CIR.  CIR following.    Subjective: Seen and examined earlier this morning.  No major events overnight of this morning.  Feels "great".  HR in the range of 90s to 100s.  Denies chest pain, dyspnea, dizziness or palpitation.  Objective: Vitals:   06/10/22 0006 06/10/22 0542 06/10/22 0746 06/10/22 1203  BP: 132/67 123/81 116/69 (!) 141/85  Pulse: (!) 111 84 (!) 105   Resp: 20 20 20 20   Temp: 98.9 F (37.2 C) 98.3 F (36.8 C) 97.8 F (36.6 C) 98 F (36.7 C)  TempSrc: Oral Oral Oral Oral  SpO2: 95% 98% 96% 96%  Weight:      Height:        Examination:  GENERAL: No apparent distress.  Appears frail. HEENT: MMM.  Vision and hearing grossly intact.  NECK: Supple.  No apparent JVD.  RESP:  No IWOB.  Fair aeration  bilaterally. CVS: Irregular rhythm.  HR ranges from 90s to 100s.  Heart sounds normal.  ABD/GI/GU: BS+. Abd soft, NTND.  MSK/EXT:   No apparent deformity. Moves extremities. No edema.  SKIN: no apparent skin lesion or wound NEURO: Awake and alert. Oriented x 4.  No apparent focal neuro deficit. PSYCH: Calm. Normal affect.   Procedures:  HD catheter placed  Microbiology summarized: MRSA PCR screen nonreactive.  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 Has had HD cath placed and started on dialysis.  Encephalopathy resolved.  Oriented x 4. -HD per nephrology. -Reorientation and delirium precaution  New onset a flutter/A-fib with RVR: TTE without significant finding.  HR improved, 90-110. -Cardizem CD to 180 mg daily.  Goal rate less than 110 per cardiology. -Continue p.o. Eliquis. -Optimize electrolytes -Cardiology signed off.   Hypokalemia/hypocalcemia/hypomagnesemia -Monitor replenish as appropriate   Atonic neurogenic bladder -Continue suprapubic catheter   Anemia of chronic kidney disease: Transfuse 1 unit early in the course. Recent Labs    06/01/22 0759 06/02/22 0411 06/03/22 0248 06/04/22 8119  06/05/22 0247 06/06/22 0053 06/07/22 0809 06/08/22 0140 06/09/22 0102 06/10/22 0112  HGB 6.7* 7.6* 7.1* 8.1* 7.9* 8.1* 7.9* 7.5* 7.6* 7.0*  -Continue monitoring -May need additional 1 unit during HD. -IV iron on MWF per nephrology   Deconditioning, debility: Improved. -Therapy recommended CIR.  CIR following.  Thrombocytopenia: Resolved.  Body mass index is 24.74 kg/m. Nutrition Problem: Inadequate oral intake Etiology: poor  appetite Signs/Symptoms: meal completion < 25% Interventions: Nepro shake   DVT prophylaxis:  apixaban (ELIQUIS) tablet 2.5 mg Start: 06/09/22 1700 SCDs Start: 05/30/22 1926 apixaban (ELIQUIS) tablet 2.5 mg On full dose anticoagulation  Code Status: DNR/DNI Family Communication: None at bedside Level of care: Telemetry Cardiac Status is: Inpatient Remains inpatient appropriate because: Safe disposition.   Final disposition: CIR.  CIR following. Consultants:  Nephrology Cardiology  35 minutes with more than 50% spent in reviewing records, counseling patient/family and coordinating care.   Sch Meds:  Scheduled Meds:  sodium chloride   Intravenous Once   apixaban  2.5 mg Oral BID   calcitRIOL  0.5 mcg Oral Daily   calcium acetate  667 mg Oral TID WC   Chlorhexidine Gluconate Cloth  6 each Topical Q0600   [START ON 06/15/2022] darbepoetin (ARANESP) injection - DIALYSIS  100 mcg Subcutaneous Q Fri-1800   diltiazem  180 mg Oral Daily   feeding supplement (NEPRO CARB STEADY)  237 mL Oral BID BM   loratadine  10 mg Oral Daily   Continuous Infusions:  ferric gluconate (FERRLECIT) IVPB 250 mg (06/10/22 1049)   PRN Meds:.acetaminophen **OR** acetaminophen, diltiazem, guaiFENesin-dextromethorphan, HYDROmorphone (DILAUDID) injection, labetalol, melatonin, ondansetron (ZOFRAN) IV, mouth rinse, polyethylene glycol  Antimicrobials: Anti-infectives (From admission, onward)    Start     Dose/Rate Route Frequency Ordered Stop   05/31/22 1115  ceFAZolin (ANCEF) IVPB 2g/100 mL premix        2 g 200 mL/hr over 30 Minutes Intravenous  Once 05/31/22 1108 05/31/22 1200   05/31/22 1112  ceFAZolin (ANCEF) 2-4 GM/100ML-% IVPB       Note to Pharmacy: Shanda Bumps M: cabinet override      05/31/22 1112 05/31/22 1206        I have personally reviewed the following labs and images: CBC: Recent Labs  Lab 06/06/22 0053 06/07/22 0809 06/08/22 0140 06/09/22 0102 06/10/22 0112  WBC 9.2  7.8 8.1 7.4 7.1  HGB 8.1* 7.9* 7.5* 7.6* 7.0*  HCT 25.8* 24.4* 22.7* 24.7* 22.8*  MCV 92.1 92.1 92.7 95.7 95.0  PLT 149* 132* 138* 151 154   BMP &GFR Recent Labs  Lab 06/06/22 0053 06/07/22 0809 06/08/22 0140 06/09/22 0102 06/10/22 0112  NA 135 134* 135 135 133*  K 3.8 3.7 4.0 4.3 4.7  CL 94* 98 95* 98 98  CO2 24 24 24 27 25   GLUCOSE 127* 106* 111* 102* 101*  BUN 87* 38* 45* 27* 45*  CREATININE 8.13* 4.94* 5.54* 3.68* 5.30*  CALCIUM 6.9* 7.3* 7.7* 7.7* 8.1*  MG 2.1 1.5* 1.8 1.8 1.8  PHOS 4.8* 3.1 3.2 2.3* 2.8   Estimated Creatinine Clearance: 11.1 mL/min (A) (by C-G formula based on SCr of 5.3 mg/dL (H)). Liver & Pancreas: Recent Labs  Lab 06/06/22 0053 06/07/22 0809 06/08/22 0140 06/09/22 0102 06/10/22 0112  ALBUMIN 3.0* 2.9* 2.9* 2.7* 2.7*   No results for input(s): "LIPASE", "AMYLASE" in the last 168 hours. No results for input(s): "AMMONIA" in the last 168 hours. Diabetic: No results for input(s): "HGBA1C" in the last 72 hours. No results  for input(s): "GLUCAP" in the last 168 hours.  Cardiac Enzymes: No results for input(s): "CKTOTAL", "CKMB", "CKMBINDEX", "TROPONINI" in the last 168 hours. No results for input(s): "PROBNP" in the last 8760 hours. Coagulation Profile: No results for input(s): "INR", "PROTIME" in the last 168 hours. Thyroid Function Tests: No results for input(s): "TSH", "T4TOTAL", "FREET4", "T3FREE", "THYROIDAB" in the last 72 hours.  Lipid Profile: No results for input(s): "CHOL", "HDL", "LDLCALC", "TRIG", "CHOLHDL", "LDLDIRECT" in the last 72 hours. Anemia Panel: No results for input(s): "VITAMINB12", "FOLATE", "FERRITIN", "TIBC", "IRON", "RETICCTPCT" in the last 72 hours. Urine analysis:    Component Value Date/Time   COLORURINE YELLOW 11/29/2020 1705   APPEARANCEUR CLOUDY (A) 11/29/2020 1705   LABSPEC 1.006 11/29/2020 1705   PHURINE 8.0 11/29/2020 1705   GLUCOSEU NEGATIVE 11/29/2020 1705   HGBUR SMALL (A) 11/29/2020 1705    BILIRUBINUR NEGATIVE 11/29/2020 1705   KETONESUR NEGATIVE 11/29/2020 1705   PROTEINUR 100 (A) 11/29/2020 1705   NITRITE NEGATIVE 11/29/2020 1705   LEUKOCYTESUR LARGE (A) 11/29/2020 1705   Sepsis Labs: Invalid input(s): "PROCALCITONIN", "LACTICIDVEN"  Microbiology: Recent Results (from the past 240 hour(s))  MRSA Next Gen by PCR, Nasal     Status: None   Collection Time: 06/04/22  8:36 AM   Specimen: Nasal Mucosa; Nasal Swab  Result Value Ref Range Status   MRSA by PCR Next Gen NOT DETECTED NOT DETECTED Final    Comment: (NOTE) The GeneXpert MRSA Assay (FDA approved for NASAL specimens only), is one component of a comprehensive MRSA colonization surveillance program. It is not intended to diagnose MRSA infection nor to guide or monitor treatment for MRSA infections. Test performance is not FDA approved in patients less than 84 years old. Performed at [facility], [address] [place], [place] [address]     Radiology Studies: No results found.    Nikkie Liming T. Jocilynn Grade [facility]  If 7PM-7AM, please contact night-coverage [email or web address] 06/10/2022, 2:25 PM

## 2022-06-10 NOTE — Progress Notes (Signed)
Rounding Note    Patient Name: Glenn Olson Date of Encounter: 06/10/2022  [facility] Cardiologist: None   Subjective   Feels ok.  No palpitations  Inpatient Medications    Scheduled Meds:  sodium chloride   Intravenous Once   apixaban  2.5 mg Oral BID   calcitRIOL  0.5 mcg Oral Daily   calcium acetate  667 mg Oral TID WC   Chlorhexidine Gluconate Cloth  6 each Topical Q0600   [START ON 06/15/2022] darbepoetin (ARANESP) injection - DIALYSIS  100 mcg Subcutaneous Q Fri-1800   diltiazem  180 mg Oral Daily   feeding supplement (NEPRO CARB STEADY)  237 mL Oral BID BM   loratadine  10 mg Oral Daily   Continuous Infusions:  ferric gluconate (FERRLECIT) IVPB 250 mg (06/10/22 1049)   PRN Meds: acetaminophen **OR** acetaminophen, diltiazem, guaiFENesin-dextromethorphan, HYDROmorphone (DILAUDID) injection, labetalol, melatonin, ondansetron (ZOFRAN) IV, polyethylene glycol   Vital Signs    Vitals:   06/09/22 1923 06/10/22 0006 06/10/22 0542 06/10/22 0746  BP: 118/65 132/67 123/81 116/69  Pulse: (!) 104 (!) 111 84 (!) 105  Resp: 20 20 20 20   Temp:  98.9 F (37.2 C) 98.3 F (36.8 C) 97.8 F (36.6 C)  TempSrc: Oral Oral Oral Oral  SpO2: 98% 95% 98% 96%  Weight:      Height:        Intake/Output Summary (Last 24 hours) at 06/10/2022 1112 Last data filed at 06/10/2022 0636 Gross per 24 hour  Intake 118.51 ml  Output 1050 ml  Net -931.49 ml      06/08/2022    3:47 AM 06/06/2022    5:45 PM 06/06/2022    1:56 PM  Last 3 Weights  Weight (lbs) 167 lb 8.8 oz 156 lb 8.4 oz 159 lb 2.8 oz  Weight (kg) 76 kg 71 kg 72.2 kg      Telemetry    Atrial fibrillation with intermittent rapid ventricular response- Personally Reviewed  ECG      Physical Exam   GEN: No acute distress.   Neck: No JVD Cardiac: Tachycardic, irregular, no murmurs, rubs, or gallops.  Respiratory: Clear to auscultation bilaterally. GI: Soft, nontender, non-distended  MS: No edema; No  deformity. Neuro:  Nonfocal  Psych: Normal affect   Labs    High Sensitivity Troponin:   Recent Labs  Lab 05/30/22 1445 05/30/22 1640  TROPONINIHS 48* 52*     Chemistry Recent Labs  Lab 06/08/22 0140 06/09/22 0102 06/10/22 0112  NA 135 135 133*  K 4.0 4.3 4.7  CL 95* 98 98  CO2 24 27 25   GLUCOSE 111* 102* 101*  BUN 45* 27* 45*  CREATININE 5.54* 3.68* 5.30*  CALCIUM 7.7* 7.7* 8.1*  MG 1.8 1.8 1.8  ALBUMIN 2.9* 2.7* 2.7*  GFRNONAA 10* 16* 10*  ANIONGAP 16* 10 10    Lipids No results for input(s): "CHOL", "TRIG", "HDL", "LABVLDL", "LDLCALC", "CHOLHDL" in the last 168 hours.  Hematology Recent Labs  Lab 06/08/22 0140 06/09/22 0102 06/10/22 0112  WBC 8.1 7.4 7.1  RBC 2.45* 2.58* 2.40*  HGB 7.5* 7.6* 7.0*  HCT 22.7* 24.7* 22.8*  MCV 92.7 95.7 95.0  MCH 30.6 29.5 29.2  MCHC 33.0 30.8 30.7  RDW 14.6 14.6 14.7  PLT 138* 151 154   Thyroid  Recent Labs  Lab 06/05/22 0251  TSH 1.494    BNPNo results for input(s): "BNP", "PROBNP" in the last 168 hours.  DDimer No results for input(s): "DDIMER" in the  last 168 hours.   Radiology    No results found.  Cardiac Studies   TTE 5/7  - Normal LVEF; moderately dilated LA  Patient Profile     82 y.o. male with ESRD and atrial fibrillation  Assessment & Plan    Atrial fibrillation:  Rates are technically controlled -- target average rate is < 110 bpm (RACE II trial) Continue diltiazem 180 Continue eliquis 5mg  PO BID  ESRD: Tolerating dialysis well per his report.     We will sign off please call for questions or concerns.  For questions or updates, please contact [facility] Please consult [email or web address] for contact info under        Signed, Maurice Small, MD  06/10/2022, 11:12 AM

## 2022-06-11 DIAGNOSIS — E876 Hypokalemia: Secondary | ICD-10-CM | POA: Diagnosis not present

## 2022-06-11 DIAGNOSIS — Z978 Presence of other specified devices: Secondary | ICD-10-CM | POA: Diagnosis not present

## 2022-06-11 DIAGNOSIS — N19 Unspecified kidney failure: Secondary | ICD-10-CM | POA: Diagnosis not present

## 2022-06-11 LAB — RENAL FUNCTION PANEL
Albumin: 2.7 g/dL — ABNORMAL LOW (ref 3.5–5.0)
Anion gap: 10 (ref 5–15)
BUN: 65 mg/dL — ABNORMAL HIGH (ref 8–23)
CO2: 25 mmol/L (ref 22–32)
Calcium: 8.3 mg/dL — ABNORMAL LOW (ref 8.9–10.3)
Chloride: 101 mmol/L (ref 98–111)
Creatinine, Ser: 6.73 mg/dL — ABNORMAL HIGH (ref 0.61–1.24)
GFR, Estimated: 8 mL/min — ABNORMAL LOW (ref >60)
Glucose, Bld: 97 mg/dL (ref 70–99)
Phosphorus: 3.6 mg/dL (ref 2.5–4.6)
Potassium: 5.4 mmol/L — ABNORMAL HIGH (ref 3.5–5.1)
Sodium: 136 mmol/L (ref 135–145)

## 2022-06-11 LAB — CBC
HCT: 23.9 % — ABNORMAL LOW (ref 39.0–52.0)
Hemoglobin: 7.4 g/dL — ABNORMAL LOW (ref 13.0–17.0)
MCH: 29.6 pg (ref 26.0–34.0)
MCHC: 31 g/dL (ref 30.0–36.0)
MCV: 95.6 fL (ref 80.0–100.0)
Platelets: 174 10*3/uL (ref 150–400)
RBC: 2.5 MIL/uL — ABNORMAL LOW (ref 4.22–5.81)
RDW: 14.7 % (ref 11.5–15.5)
WBC: 7.7 10*3/uL (ref 4.0–10.5)
nRBC: 0 % (ref 0.0–0.2)

## 2022-06-11 MED ORDER — HEPARIN SODIUM (PORCINE) 1000 UNIT/ML IJ SOLN
INTRAMUSCULAR | Status: AC
  Filled 2022-06-11: qty 4

## 2022-06-11 NOTE — Procedures (Signed)
HD Note:  Some information was entered later than the data was gathered due to patient care needs. The stated time with the data is accurate.  Received patient in bed to unit.  Alert and oriented.  Informed consent signed and in chart.   TX duration:3.5 hours  Patient tolerated well.  He slept through the treatment with no interaction except to continue to take his mask off while catheter was accessed.  Unable or unwilling to follow directions.  Patient had an episode of agitation where he stated he did not know how he was feeling.  UF turned off for patient comfort.  Patient went back to sleep within 5 minutes. See flowsheet  Transported back to the room  Sleeping without acute distress.  Hand-off given to patient's nurse.   Access used: right upper chest HD catheter Access issues: none  Total UF removed: 700     [facility]

## 2022-06-11 NOTE — Progress Notes (Signed)
PT Cancellation Note  Patient Details Name: Lemond Mckinney MRN: [id number] DOB: 1940/09/16   Cancelled Treatment:    Reason Eval/Treat Not Completed: Patient at procedure or test/unavailable. Pt off floor at HD. PT to return as able to progress mobility.  Lewis Shock, PT, DPT Acute Rehabilitation Services Secure chat preferred Office #: [phone]    Iona Hansen 06/11/2022, 10:16 AM

## 2022-06-11 NOTE — Procedures (Signed)
Patient seen and examined on Hemodialysis. The procedure was supervised and I have made appropriate changes.  BP 126/84   Pulse (!) 111   Temp 97.8 F (36.6 C)   Resp 18   Ht 5\' 9"  (1.753 m)   Wt 74.2 kg   SpO2 95%   BMI 24.16 kg/m   QB 400 mL/ min, UF goal 2L  Tolerating treatment without complaints at this time.   Bufford Buttner MD [facility] [phone] 1:32 PM

## 2022-06-11 NOTE — Progress Notes (Signed)
Inpatient Rehab Admissions Coordinator:   Appears rate controlled with goal <110 per Cardiology.  HD schedule MWF.  I do not have a bed for this patient to admit to CIR today but will follow for potential admit pending stability and bed availability.   Estill Dooms, PT, DPT Admissions Coordinator [phone] 06/11/22  12:52 PM

## 2022-06-11 NOTE — Plan of Care (Signed)
  Problem: Clinical Measurements: Goal: Will remain free from infection Outcome: Progressing Goal: Cardiovascular complication will be avoided Outcome: Progressing   

## 2022-06-11 NOTE — Progress Notes (Signed)
PROGRESS NOTE  Given Glenn Olson:[id number] DOB: Oct 14, 1940   PCP: Benita Stabile, MD  Patient is from: Home  DOA: 05/30/2022 LOS: 12  Chief complaints Chief Complaint  Patient presents with   Headache     Brief Narrative / Interim history: 82 y.o. male with medical history significant for ESRD, neurogenic atonic bladder presented to [facility] with headache and chest pain for 1 week.  Patient was on hemodialysis but had stopped going for hemodialysis for 1 year now.  Has been having poor oral intake with nausea and vomiting.  In the ED, patient was mildly tachypneic and tachycardic with elevated blood pressure.  Chest x-ray with increased left lower lower lobe markings. CO2 less than 7.  BUN of 241.  Potassium 4.5.  Cr 13.8.Marland KitchenTroponin 48 > 52.  Nephrology was consulted and decision was to proceed with hemodialysis.  Patient was then transferred to [facility].   Patient has had HD cath placed and started on hemodialysis.  Encephalopathy improving.  Hospital course complicated by A-fib with RVR.  He was started on p.o. Cardizem and IV heparin.  TTE without significant finding.  RVR improved.  Cardiology titrating p.o. Cardizem.  Therapy recommended CIR.  CIR following.    Subjective: Seen and examined earlier this morning.  No major events overnight of this morning.  About to start HD.  Sleepy but wakes to voice.  No complaints.   Objective: Vitals:   06/11/22 1030 06/11/22 1100 06/11/22 1130 06/11/22 1200  BP: 120/77 136/78 134/76 121/63  Pulse: (!) 102 (!) 123 (!) 106 (!) 49  Resp: (!) 26 (!) 21 16 18   Temp:      TempSrc:      SpO2: 97% 96% 98% 93%  Weight:      Height:        Examination:  GENERAL: Appears frail.  No apparent distress. HEENT: MMM.  Vision and hearing grossly intact.  NECK: Supple.  No apparent JVD.  RESP:  No IWOB.  Fair aeration bilaterally. CVS: Irregular rhythm.  HR in 100s.  Heart sounds normal.  ABD/GI/GU: BS+. Abd soft, NTND.   Suprapubic cath. MSK/EXT:   No apparent deformity. Moves extremities. No edema.  SKIN: no apparent skin lesion or wound NEURO: Sleepy but wakes to voice.  No apparent focal neuro deficit. PSYCH: Calm. Normal affect.   Procedures:  HD catheter placed  Microbiology summarized: MRSA PCR screen nonreactive.  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 Has had HD cath placed and started on dialysis.  Encephalopathy resolved.  Oriented x 4. -HD per nephrology.  Also making fair amount of urine. -Reorientation and delirium precaution  New onset a flutter/A-fib with RVR: TTE without significant finding.  HR improved. -Cardizem CD to 180 mg daily.  Goal rate less than 110 per cardiology. -Continue p.o. Eliquis. -Optimize electrolytes -Cardiology signed off.   Hypokalemia/hypocalcemia/hypomagnesemia -Monitor replenish as appropriate   Atonic neurogenic bladder -Continue suprapubic catheter   Anemia of chronic kidney disease: Transfuse 1 unit early in the course. Recent Labs    06/02/22 0411 06/03/22 8119 06/04/22 1478 06/05/22 2956 06/06/22 2130 06/07/22 8657 06/08/22 0140 06/09/22 0102 06/10/22 0112  06/11/22 0123  HGB 7.6* 7.1* 8.1* 7.9* 8.1* 7.9* 7.5* 7.6* 7.0* 7.4*  -Continue monitoring -IV iron on MWF per nephrology   Deconditioning, debility: Improved. -Therapy recommended CIR.  CIR following.  Thrombocytopenia: Resolved.  Body mass index is 24.16 kg/m. Nutrition Problem: Inadequate oral intake Etiology: poor appetite Signs/Symptoms: meal completion < 25% Interventions: Nepro shake   DVT prophylaxis:  apixaban (ELIQUIS) tablet 2.5 mg Start:  06/09/22 1700 SCDs Start: 05/30/22 1926 apixaban (ELIQUIS) tablet 2.5 mg On full dose anticoagulation  Code Status: DNR/DNI Family Communication: None at bedside Level of care: Telemetry Cardiac Status is: Inpatient Remains inpatient appropriate because: Safe disposition.   Final disposition: CIR.  CIR following. Consultants:  Nephrology Cardiology-signed off  35 minutes with more than 50% spent in reviewing records, counseling patient/family and coordinating care.   Sch Meds:  Scheduled Meds:  sodium chloride   Intravenous Once   apixaban  2.5 mg Oral BID   calcitRIOL  0.5 mcg Oral Daily   calcium acetate  667 mg Oral TID WC   Chlorhexidine Gluconate Cloth  6 each Topical Q0600   [START ON 06/15/2022] darbepoetin (ARANESP) injection - DIALYSIS  100 mcg Subcutaneous Q Fri-1800   diltiazem  180 mg Oral Daily   feeding supplement (NEPRO CARB STEADY)  237 mL Oral BID BM   heparin sodium (porcine)       loratadine  10 mg Oral Daily   Continuous Infusions:  ferric gluconate (FERRLECIT) IVPB Stopped (06/10/22 1252)   PRN Meds:.acetaminophen **OR** acetaminophen, diltiazem, guaiFENesin-dextromethorphan, heparin sodium (porcine), HYDROmorphone (DILAUDID) injection, labetalol, melatonin, ondansetron (ZOFRAN) IV, mouth rinse, polyethylene glycol  Antimicrobials: Anti-infectives (From admission, onward)    Start     Dose/Rate Route Frequency Ordered Stop   05/31/22 1115  ceFAZolin (ANCEF) IVPB 2g/100 mL premix        2 g 200 mL/hr over 30 Minutes Intravenous  Once 05/31/22 1108 05/31/22 1200   05/31/22 1112  ceFAZolin (ANCEF) 2-4 GM/100ML-% IVPB       Note to Pharmacy: Shanda Bumps M: cabinet override      05/31/22 1112 05/31/22 1206        I have personally reviewed the following labs and images: CBC: Recent Labs  Lab 06/07/22 0809 06/08/22 0140 06/09/22 0102 06/10/22 0112 06/11/22 0123  WBC 7.8 8.1 7.4 7.1 7.7  HGB 7.9* 7.5* 7.6* 7.0* 7.4*  HCT 24.4* 22.7*  24.7* 22.8* 23.9*  MCV 92.1 92.7 95.7 95.0 95.6  PLT 132* 138* 151 154 174   BMP &GFR Recent Labs  Lab 06/06/22 0053 06/07/22 0809 06/08/22 0140 06/09/22 0102 06/10/22 0112 06/11/22 0943  NA 135 134* 135 135 133* 136  K 3.8 3.7 4.0 4.3 4.7 5.4*  CL 94* 98 95* 98 98 101  CO2 24 24 24 27 25 25   GLUCOSE 127* 106* 111* 102* 101* 97  BUN 87* 38* 45* 27* 45* 65*  CREATININE 8.13* 4.94* 5.54* 3.68* 5.30* 6.73*  CALCIUM 6.9* 7.3* 7.7* 7.7* 8.1* 8.3*  MG 2.1 1.5* 1.8 1.8 1.8  --   PHOS 4.8* 3.1 3.2 2.3* 2.8 3.6   Estimated Creatinine Clearance: 8.8 mL/min (A) (by C-G formula based on SCr of 6.73 mg/dL (H)). Liver & Pancreas: Recent Labs  Lab 06/07/22 0809 06/08/22 0140 06/09/22 0102 06/10/22 0112 06/11/22 0943  ALBUMIN 2.9* 2.9* 2.7* 2.7* 2.7*   No results for input(s): "LIPASE", "AMYLASE" in the last 168 hours. No results for input(s): "AMMONIA" in the last 168 hours. Diabetic: No results for  input(s): "HGBA1C" in the last 72 hours. No results for input(s): "GLUCAP" in the last 168 hours.  Cardiac Enzymes: No results for input(s): "CKTOTAL", "CKMB", "CKMBINDEX", "TROPONINI" in the last 168 hours. No results for input(s): "PROBNP" in the last 8760 hours. Coagulation Profile: No results for input(s): "INR", "PROTIME" in the last 168 hours. Thyroid Function Tests: No results for input(s): "TSH", "T4TOTAL", "FREET4", "T3FREE", "THYROIDAB" in the last 72 hours.  Lipid Profile: No results for input(s): "CHOL", "HDL", "LDLCALC", "TRIG", "CHOLHDL", "LDLDIRECT" in the last 72 hours. Anemia Panel: No results for input(s): "VITAMINB12", "FOLATE", "FERRITIN", "TIBC", "IRON", "RETICCTPCT" in the last 72 hours. Urine analysis:    Component Value Date/Time   COLORURINE YELLOW 11/29/2020 1705   APPEARANCEUR CLOUDY (A) 11/29/2020 1705   LABSPEC 1.006 11/29/2020 1705   PHURINE 8.0 11/29/2020 1705   GLUCOSEU NEGATIVE 11/29/2020 1705   HGBUR SMALL (A) 11/29/2020 1705   BILIRUBINUR  NEGATIVE 11/29/2020 1705   KETONESUR NEGATIVE 11/29/2020 1705   PROTEINUR 100 (A) 11/29/2020 1705   NITRITE NEGATIVE 11/29/2020 1705   LEUKOCYTESUR LARGE (A) 11/29/2020 1705   Sepsis Labs: Invalid input(s): "PROCALCITONIN", "LACTICIDVEN"  Microbiology: Recent Results (from the past 240 hour(s))  MRSA Next Gen by PCR, Nasal     Status: None   Collection Time: 06/04/22  8:36 AM   Specimen: Nasal Mucosa; Nasal Swab  Result Value Ref Range Status   MRSA by PCR Next Gen NOT DETECTED NOT DETECTED Final    Comment: (NOTE) The GeneXpert MRSA Assay (FDA approved for NASAL specimens only), is one component of a comprehensive MRSA colonization surveillance program. It is not intended to diagnose MRSA infection nor to guide or monitor treatment for MRSA infections. Test performance is not FDA approved in patients less than 22 years old. Performed at [facility], [address], [place], [place] [address]     Radiology Studies: No results found.    Retal Tonkinson T. Teresea Donley [facility]  If 7PM-7AM, please contact night-coverage [email or web address] 06/11/2022, 12:12 PM

## 2022-06-11 NOTE — Progress Notes (Signed)
[facility] Progress Note   Assessment/ Plan:   ESRD: History of such and was off dialysis for some time. Looked into PD but not an option given adhesions. Now ESRD again and agreeing to dialysis -Continue with dialysis MWF -Outpatient dialysis arranged at [facility] but waiting for formal discharge plan given possibly going to CIR -[facility] in place with [facility].  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 Randie Heinz assessment from 03/17/21. Will need to f/u with [facility] for further plans regarding access in the future.   Atrial fibrillation with RVR: Persistent tachycardia.  Management per primary team   Anemia of CKD: Persistently low hemoglobin.  Iron depleted and IV iron as ordered.  Increase ESA due on Friday.   Hypertension: Blood pressure acceptable on current medications.   Secondary hyperparathyroidism: With hypocalcemia and hyperphosphatemia.  On PhosLo.  Started calcitriol 0.5 mcg daily.  Ca and phos much improved. Decrease phosloContinue to monitor  Subjective:    Seen on HD.  Sleeping, no complaints.     Objective:   BP 126/84   Pulse (!) 111   Temp 97.8 F (36.6 C)   Resp 18   Ht 5\' 9"  (1.753 m)   Wt 74.2 kg   SpO2 95%   BMI 24.16 kg/m   Intake/Output Summary (Last 24 hours) at 06/11/2022 1330 Last data filed at 06/11/2022 0606 Gross per 24 hour  Intake 270 ml  Output 600 ml  Net -330 ml   Weight change:   Physical Exam: ZOX:[id number] appearing CVS: irregular Resp: clear Abd: soft Ext: no LE edema ACCESS: TDC  Imaging: No results found.  Labs: BMET Recent Labs  Lab 06/05/22 0251 06/06/22 0053 06/07/22 0809 06/08/22 0140 06/09/22 0102 06/10/22 0112 06/11/22 0943  NA 140 135 134* 135 135 133* 136  K 3.1* 3.8 3.7 4.0 4.3 4.7 5.4*  CL 94* 94* 98 95* 98 98 101  CO2 27 24 24 24 27 25 25   GLUCOSE 96 127* 106* 111* 102* 101* 97  BUN 81* 87* 38* 45* 27* 45* 65*  CREATININE 8.21* 8.13* 4.94* 5.54* 3.68* 5.30* 6.73*   CALCIUM 6.6* 6.9* 7.3* 7.7* 7.7* 8.1* 8.3*  PHOS 5.8* 4.8* 3.1 3.2 2.3* 2.8 3.6   CBC Recent Labs  Lab 06/08/22 0140 06/09/22 0102 06/10/22 0112 06/11/22 0123  WBC 8.1 7.4 7.1 7.7  HGB 7.5* 7.6* 7.0* 7.4*  HCT 22.7* 24.7* 22.8* 23.9*  MCV 92.7 95.7 95.0 95.6  PLT 138* 151 154 174    Medications:     sodium chloride   Intravenous Once   apixaban  2.5 mg Oral BID   calcitRIOL  0.5 mcg Oral Daily   calcium acetate  667 mg Oral TID WC   Chlorhexidine Gluconate Cloth  6 each Topical Q0600   [START ON 06/15/2022] darbepoetin (ARANESP) injection - DIALYSIS  100 mcg Subcutaneous Q Fri-1800   diltiazem  180 mg Oral Daily   feeding supplement (NEPRO CARB STEADY)  237 mL Oral BID BM   heparin sodium (porcine)       loratadine  10 mg Oral Daily   Bufford Buttner MD 06/11/2022, 1:30 PM

## 2022-06-12 DIAGNOSIS — N19 Unspecified kidney failure: Secondary | ICD-10-CM | POA: Diagnosis not present

## 2022-06-12 DIAGNOSIS — E876 Hypokalemia: Secondary | ICD-10-CM | POA: Diagnosis not present

## 2022-06-12 DIAGNOSIS — Z978 Presence of other specified devices: Secondary | ICD-10-CM | POA: Diagnosis not present

## 2022-06-12 LAB — CBC
HCT: 25.2 % — ABNORMAL LOW (ref 39.0–52.0)
Hemoglobin: 7.8 g/dL — ABNORMAL LOW (ref 13.0–17.0)
MCH: 29.7 pg (ref 26.0–34.0)
MCHC: 31 g/dL (ref 30.0–36.0)
MCV: 95.8 fL (ref 80.0–100.0)
Platelets: 199 10*3/uL (ref 150–400)
RBC: 2.63 MIL/uL — ABNORMAL LOW (ref 4.22–5.81)
RDW: 14.7 % (ref 11.5–15.5)
WBC: 7.5 10*3/uL (ref 4.0–10.5)
nRBC: 0 % (ref 0.0–0.2)

## 2022-06-12 LAB — RENAL FUNCTION PANEL
Albumin: 2.8 g/dL — ABNORMAL LOW (ref 3.5–5.0)
Anion gap: 12 (ref 5–15)
BUN: 31 mg/dL — ABNORMAL HIGH (ref 8–23)
CO2: 27 mmol/L (ref 22–32)
Calcium: 8.4 mg/dL — ABNORMAL LOW (ref 8.9–10.3)
Chloride: 96 mmol/L — ABNORMAL LOW (ref 98–111)
Creatinine, Ser: 3.86 mg/dL — ABNORMAL HIGH (ref 0.61–1.24)
GFR, Estimated: 15 mL/min — ABNORMAL LOW (ref >60)
Glucose, Bld: 101 mg/dL — ABNORMAL HIGH (ref 70–99)
Phosphorus: 2.9 mg/dL (ref 2.5–4.6)
Potassium: 5.2 mmol/L — ABNORMAL HIGH (ref 3.5–5.1)
Sodium: 135 mmol/L (ref 135–145)

## 2022-06-12 LAB — MAGNESIUM: Magnesium: 1.6 mg/dL — ABNORMAL LOW (ref 1.7–2.4)

## 2022-06-12 MED ORDER — MAGNESIUM SULFATE IN D5W 1-5 GM/100ML-% IV SOLN
1.0000 g | Freq: Once | INTRAVENOUS | Status: AC
  Administered 2022-06-12: 1 g via INTRAVENOUS
  Filled 2022-06-12: qty 100

## 2022-06-12 MED ORDER — SODIUM ZIRCONIUM CYCLOSILICATE 10 G PO PACK
10.0000 g | PACK | Freq: Once | ORAL | Status: AC
  Administered 2022-06-12: 10 g via ORAL
  Filled 2022-06-12: qty 1

## 2022-06-12 NOTE — Progress Notes (Signed)
[facility] Progress Note   Assessment/ Plan:   ESRD: History of such and was off dialysis for some time. Looked into PD but not an option given adhesions. Now ESRD again and agreeing to dialysis -Continue with dialysis MWF -Outpatient dialysis arranged at SW but waiting for formal discharge plan given possibly going to CIR -[facility] in place with [facility].  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 Randie Heinz assessment from 03/17/21. Will need to f/u with [facility] for further plans regarding access in the future. - s/p dose of lokelma today   Atrial fibrillation with RVR: Persistent tachycardia.  Management per primary team   Anemia of CKD: Persistently low hemoglobin.  Iron depleted and IV iron as ordered.  Increase ESA due on Friday.   Hypertension: Blood pressure acceptable on current medications.   Secondary hyperparathyroidism: With hypocalcemia and hyperphosphatemia.  On PhosLo.  Started calcitriol 0.5 mcg daily.  Ca and phos much improved. Decrease phosloContinue to monitor  Subjective:    Seen in room.  Son at bedside.  Feeling well and excited about possible CIR admission.     Objective:   BP (!) 102/59 (BP Location: Right Arm)   Pulse 93   Temp 98.5 F (36.9 C) (Oral)   Resp 18   Ht 5\' 9"  (1.753 m)   Wt 74.2 kg   SpO2 98%   BMI 24.16 kg/m   Intake/Output Summary (Last 24 hours) at 06/12/2022 1426 Last data filed at 06/12/2022 0544 Gross per 24 hour  Intake 120 ml  Output 1450 ml  Net -1330 ml   Weight change: 0 kg  Physical Exam: [id number] appearing CVS: irregular Resp: clear Abd: soft Ext: no LE edema ACCESS: TDC  Imaging: No results found.  Labs: BMET Recent Labs  Lab 06/06/22 0053 06/07/22 0809 06/08/22 0140 06/09/22 0102 06/10/22 0112 06/11/22 0943 06/12/22 0157  NA 135 134* 135 135 133* 136 135  K 3.8 3.7 4.0 4.3 4.7 5.4* 5.2*  CL 94* 98 95* 98 98 101 96*  CO2 24 24 24 27 25 25 27   GLUCOSE 127* 106*  111* 102* 101* 97 101*  BUN 87* 38* 45* 27* 45* 65* 31*  CREATININE 8.13* 4.94* 5.54* 3.68* 5.30* 6.73* 3.86*  CALCIUM 6.9* 7.3* 7.7* 7.7* 8.1* 8.3* 8.4*  PHOS 4.8* 3.1 3.2 2.3* 2.8 3.6 2.9   CBC Recent Labs  Lab 06/09/22 0102 06/10/22 0112 06/11/22 0123 06/12/22 0157  WBC 7.4 7.1 7.7 7.5  HGB 7.6* 7.0* 7.4* 7.8*  HCT 24.7* 22.8* 23.9* 25.2*  MCV 95.7 95.0 95.6 95.8  PLT 151 154 174 199    Medications:     sodium chloride   Intravenous Once   apixaban  2.5 mg Oral BID   calcitRIOL  0.5 mcg Oral Daily   calcium acetate  667 mg Oral TID WC   Chlorhexidine Gluconate Cloth  6 each Topical Q0600   [START ON 06/15/2022] darbepoetin (ARANESP) injection - DIALYSIS  100 mcg Subcutaneous Q Fri-1800   diltiazem  180 mg Oral Daily   feeding supplement (NEPRO CARB STEADY)  237 mL Oral BID BM   loratadine  10 mg Oral Daily   Bufford Buttner MD 06/12/2022, 2:26 PM

## 2022-06-12 NOTE — Progress Notes (Signed)
Inpatient Rehab Admissions Coordinator:   I have no beds available for this patient to admit to CIR today.  Will continue to follow for timing of potential admission pending bed availability.  I will update pt/son today.   Estill Dooms, PT, DPT Admissions Coordinator [phone] 06/12/22  10:11 AM

## 2022-06-12 NOTE — Progress Notes (Signed)
Update provided to [facility] admissions and [facility] regarding pt being considered for CIR. Will provide further updates as information is received. Will assist as needed.   Olivia Canter Renal Navigator [phone]

## 2022-06-12 NOTE — Progress Notes (Signed)
PROGRESS NOTE  Glenn Olson ZOX:[id number] DOB: 1940/09/11   PCP: Benita Stabile, MD  Patient is from: Home  DOA: 05/30/2022 LOS: 13  Chief complaints Chief Complaint  Patient presents with   Headache     Brief Narrative / Interim history: 82 y.o. male with medical history significant for ESRD, neurogenic atonic bladder presented to [facility] with headache and chest pain for 1 week.  Patient was on hemodialysis but had stopped going for hemodialysis for 1 year now.  Has been having poor oral intake with nausea and vomiting.  In the ED, patient was mildly tachypneic and tachycardic with elevated blood pressure.  Chest x-ray with increased left lower lower lobe markings. CO2 less than 7.  BUN of 241.  Potassium 4.5.  Cr 13.8.Marland KitchenTroponin 48 > 52.  Nephrology was consulted and decision was to proceed with hemodialysis.  Patient was then transferred to [facility].   Patient has had HD cath placed and started on hemodialysis.  Encephalopathy improving.  Hospital course complicated by A-fib with RVR.  He was started on p.o. Cardizem and IV heparin.  TTE without significant finding.  RVR improved.  Transition to p.o. Cardizem CD.  Cardiology signed off.  Therapy recommended CIR. Medically stable for discharge.    Subjective: Seen and examined earlier this morning.  No major events overnight of this morning.  No complaints.  HR in the range of 90s to 110s.  Objective: Vitals:   06/12/22 0324 06/12/22 0752 06/12/22 1221 06/12/22 1246  BP: 125/64 119/71 109/72 (!) 102/59  Pulse: (!) 102 93    Resp: 16 16 17 18   Temp: 98 F (36.7 C) 98.2 F (36.8 C) 98.5 F (36.9 C) 98.5 F (36.9 C)  TempSrc: Oral Oral Oral Oral  SpO2: 99% 98%    Weight:      Height:        Examination:  GENERAL: Appears frail.  No apparent distress. HEENT: MMM.  Vision and hearing grossly intact.  NECK: Supple.  No apparent JVD.  RESP:  No IWOB.  Fair aeration bilaterally. CVS: Irregular rhythm.   HR in 100s.  Heart sounds normal.  ABD/GI/GU: BS+. Abd soft, NTND.  Suprapubic cath. MSK/EXT:   No apparent deformity. Moves extremities. No edema.  SKIN: no apparent skin lesion or wound NEURO: Sleepy but wakes to voice.  No apparent focal neuro deficit. PSYCH: Calm. Normal affect.   Procedures:  HD catheter placed  Microbiology summarized: MRSA PCR screen nonreactive.  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 Has had HD cath placed and started on dialysis.  Encephalopathy resolved.  Oriented x 4. -HD per nephrology.  Also making fair amount of urine. -Reorientation and delirium precaution  New onset a flutter/A-fib with RVR: TTE without significant finding.  HR improved. -Cardizem CD to 180 mg daily.  Goal rate less than 110 per cardiology. -Continue p.o. Eliquis. -Optimize electrolytes -Cardiology signed off.   Hypokalemia/hypocalcemia/hypomagnesemia -Monitor replenish as appropriate  Hyperkalemia -P.o. Lokelma 10 g x 1 -Recheck in the morning   Atonic neurogenic bladder -Continue suprapubic catheter   Anemia of chronic kidney disease: Transfuse 1 unit early in the course.  Recent Labs    06/03/22 0248 06/04/22 0414 06/05/22 0247 06/06/22 0053 06/07/22 0809 06/08/22 0140 06/09/22 0102 06/10/22 0112 06/11/22 0123 06/12/22 0157  HGB 7.1* 8.1* 7.9* 8.1* 7.9* 7.5* 7.6* 7.0* 7.4* 7.8*  -Continue monitoring -IV iron on MWF per nephrology   Deconditioning, debility: Improved. -Therapy recommended CIR.  CIR following.  Thrombocytopenia: Resolved.  Body mass index is 24.16 kg/m. Nutrition Problem: Inadequate oral intake Etiology: poor  appetite Signs/Symptoms: meal completion < 25% Interventions: Nepro shake   DVT prophylaxis:  apixaban (ELIQUIS) tablet 2.5 mg Start: 06/09/22 1700 SCDs Start: 05/30/22 1926 apixaban (ELIQUIS) tablet 2.5 mg On full dose anticoagulation  Code Status: DNR/DNI Family Communication: None at bedside Level of care: Telemetry Cardiac Status is: Inpatient Remains inpatient appropriate because: Safe disposition.   Final disposition: CIR. Consultants:  Nephrology Cardiology-signed off  35 minutes with more than 50% spent in reviewing records, counseling patient/family and coordinating care.   Sch Meds:  Scheduled Meds:  sodium chloride   Intravenous Once   apixaban  2.5 mg Oral BID   calcitRIOL  0.5 mcg Oral Daily   calcium acetate  667 mg Oral TID WC   Chlorhexidine Gluconate Cloth  6 each Topical Q0600   [START ON 06/15/2022] darbepoetin (ARANESP) injection - DIALYSIS  100 mcg Subcutaneous Q Fri-1800   diltiazem  180 mg Oral Daily   feeding supplement (NEPRO CARB STEADY)  237 mL Oral BID BM   loratadine  10 mg Oral Daily   Continuous Infusions:   PRN Meds:.acetaminophen **OR** acetaminophen, diltiazem, guaiFENesin-dextromethorphan, HYDROmorphone (DILAUDID) injection, labetalol, melatonin, ondansetron (ZOFRAN) IV, mouth rinse, polyethylene glycol  Antimicrobials: Anti-infectives (From admission, onward)    Start     Dose/Rate Route Frequency Ordered Stop   05/31/22 1115  ceFAZolin (ANCEF) IVPB 2g/100 mL premix        2 g 200 mL/hr over 30 Minutes Intravenous  Once 05/31/22 1108 05/31/22 1200   05/31/22 1112  ceFAZolin (ANCEF) 2-4 GM/100ML-% IVPB       Note to Pharmacy: Shanda Bumps M: cabinet override      05/31/22 1112 05/31/22 1206        I have personally reviewed the following labs and images: CBC: Recent Labs  Lab 06/08/22 0140 06/09/22 0102 06/10/22 0112 06/11/22 0123 06/12/22 0157  WBC 8.1 7.4 7.1 7.7 7.5  HGB 7.5* 7.6* 7.0* 7.4* 7.8*  HCT 22.7*  24.7* 22.8* 23.9* 25.2*  MCV 92.7 95.7 95.0 95.6 95.8  PLT 138* 151 154 174 199   BMP &GFR Recent Labs  Lab 06/07/22 0809 06/08/22 0140 06/09/22 0102 06/10/22 0112 06/11/22 0943 06/12/22 0157  NA 134* 135 135 133* 136 135  K 3.7 4.0 4.3 4.7 5.4* 5.2*  CL 98 95* 98 98 101 96*  CO2 24 24 27 25 25 27   GLUCOSE 106* 111* 102* 101* 97 101*  BUN 38* 45* 27* 45* 65* 31*  CREATININE 4.94* 5.54* 3.68* 5.30* 6.73* 3.86*  CALCIUM 7.3* 7.7* 7.7* 8.1* 8.3* 8.4*  MG 1.5* 1.8 1.8 1.8  --  1.6*  PHOS 3.1 3.2 2.3* 2.8 3.6 2.9   Estimated Creatinine Clearance: 15.3 mL/min (A) (by C-G formula based on SCr of 3.86 mg/dL (H)). Liver & Pancreas: Recent Labs  Lab 06/08/22 0140 06/09/22 0102 06/10/22 0112 06/11/22 0943 06/12/22 0157  ALBUMIN 2.9* 2.7* 2.7* 2.7* 2.8*   No results for input(s): "LIPASE", "AMYLASE" in the last 168 hours. No results for input(s): "AMMONIA" in the last 168 hours. Diabetic: No results for  input(s): "HGBA1C" in the last 72 hours. No results for input(s): "GLUCAP" in the last 168 hours.  Cardiac Enzymes: No results for input(s): "CKTOTAL", "CKMB", "CKMBINDEX", "TROPONINI" in the last 168 hours. No results for input(s): "PROBNP" in the last 8760 hours. Coagulation Profile: No results for input(s): "INR", "PROTIME" in the last 168 hours. Thyroid Function Tests: No results for input(s): "TSH", "T4TOTAL", "FREET4", "T3FREE", "THYROIDAB" in the last 72 hours.  Lipid Profile: No results for input(s): "CHOL", "HDL", "LDLCALC", "TRIG", "CHOLHDL", "LDLDIRECT" in the last 72 hours. Anemia Panel: No results for input(s): "VITAMINB12", "FOLATE", "FERRITIN", "TIBC", "IRON", "RETICCTPCT" in the last 72 hours. Urine analysis:    Component Value Date/Time   COLORURINE YELLOW 11/29/2020 1705   APPEARANCEUR CLOUDY (A) 11/29/2020 1705   LABSPEC 1.006 11/29/2020 1705   PHURINE 8.0 11/29/2020 1705   GLUCOSEU NEGATIVE 11/29/2020 1705   HGBUR SMALL (A) 11/29/2020 1705    BILIRUBINUR NEGATIVE 11/29/2020 1705   KETONESUR NEGATIVE 11/29/2020 1705   PROTEINUR 100 (A) 11/29/2020 1705   NITRITE NEGATIVE 11/29/2020 1705   LEUKOCYTESUR LARGE (A) 11/29/2020 1705   Sepsis Labs: Invalid input(s): "PROCALCITONIN", "LACTICIDVEN"  Microbiology: Recent Results (from the past 240 hour(s))  MRSA Next Gen by PCR, Nasal     Status: None   Collection Time: 06/04/22  8:36 AM   Specimen: Nasal Mucosa; Nasal Swab  Result Value Ref Range Status   MRSA by PCR Next Gen NOT DETECTED NOT DETECTED Final    Comment: (NOTE) The GeneXpert MRSA Assay (FDA approved for NASAL specimens only), is one component of a comprehensive MRSA colonization surveillance program. It is not intended to diagnose MRSA infection nor to guide or monitor treatment for MRSA infections. Test performance is not FDA approved in patients less than 29 years old. Performed at [facility], [address] [place], [place] [address]     Radiology Studies: No results found.    Laretta Pyatt T. Nandita Mathenia [facility]  If 7PM-7AM, please contact night-coverage [email or web address] 06/12/2022, 1:58 PM

## 2022-06-12 NOTE — Progress Notes (Signed)
Occupational Therapy Treatment Patient Details Name: Glenn Olson MRN: [id number] DOB: 1940-05-30 Today's Date: 06/12/2022   History of present illness Glenn Olson is a 82 y.o. male admitted 5/1  presented to hospital with headache and chest pain for 1 week.  Patient was on hemodialysis but had stopped going for hemodialysis for 1 year now.  Has been having poor oral intake with nausea and vomiting.  In the ED, patient was mildly tachypneic and tachycardic with elevated blood pressure as well as hypokalemia.  Chest x-ray with increased left lower lower lobe markings.  HD reinitiated. afib with RVR developed 5/9, transferred to cardiac progressive. PMH: ESRD, neurogenic atonic bladder   OT comments  Patient with fair progress toward patient focused goals.  Patient presenting with fatigue, but agreed to EOB grooming and sit to stand to maintain strength and mobility.  Patient declined up to the recliner, and overall moved with up to Mod A.  Patient with difficulty reaching feet, but able to stand for pant management with Mod A.  OT continues to be indicated to address deficits, and Patient will benefit from intensive inpatient follow up therapy, >3 hours/day to regain independence for eventual return home at Mod I level.     Recommendations for follow up therapy are one component of a multi-disciplinary discharge planning process, led by the attending physician.  Recommendations may be updated based on patient status, additional functional criteria and insurance authorization.    Assistance Recommended at Discharge Frequent or constant Supervision/Assistance  Patient can return home with the following  A lot of help with walking and/or transfers;A lot of help with bathing/dressing/bathroom;Assistance with cooking/housework;Assist for transportation;Help with stairs or ramp for entrance   Equipment Recommendations  Other (comment)    Recommendations for Other Services      Precautions /  Restrictions Precautions Precautions: Fall Precaution Comments: watch HR Restrictions Weight Bearing Restrictions: No       Mobility Bed Mobility   Bed Mobility: Supine to Sit, Sit to Supine     Supine to sit: Min assist Sit to supine: Min assist        Transfers Overall transfer level: Needs assistance   Transfers: Sit to/from Stand Sit to Stand: Mod assist, From elevated surface                 Balance Overall balance assessment: Needs assistance Sitting-balance support: Feet supported Sitting balance-Leahy Scale: Good     Standing balance support: Reliant on assistive device for balance Standing balance-Leahy Scale: Poor                             ADL either performed or assessed with clinical judgement   ADL       Grooming: Set up;Sitting   Upper Body Bathing: Minimal assistance;Sitting   Lower Body Bathing: Sitting/lateral leans;Moderate assistance   Upper Body Dressing : Minimal assistance;Sitting   Lower Body Dressing: Moderate assistance;Sit to/from stand;Maximal assistance                      Extremity/Trunk Assessment Upper Extremity Assessment Upper Extremity Assessment: Generalized weakness   Lower Extremity Assessment Lower Extremity Assessment: Defer to PT evaluation   Cervical / Trunk Assessment Cervical / Trunk Assessment: Kyphotic    Vision Patient Visual Report: No change from baseline     Perception     Praxis      Cognition Arousal/Alertness: Awake/alert Behavior During Therapy: [facility] for  tasks assessed/performed Overall Cognitive Status: No family/caregiver present to determine baseline cognitive functioning                     Current Attention Level: Selective Memory: Decreased recall of precautions Following Commands: Follows one step commands consistently   Awareness: Emergent Problem Solving: Slow processing, Decreased initiation, Requires verbal cues                        General Comments  HR 89    Pertinent Vitals/ Pain       Pain Assessment Pain Assessment: No/denies pain Pain Intervention(s): Monitored during session                                                          Frequency  Min 2X/week        Progress Toward Goals  OT Goals(current goals can now be found in the care plan section)  Progress towards OT goals: Progressing toward goals  Acute Rehab OT Goals OT Goal Formulation: With patient Time For Goal Achievement: 06/19/22 Potential to Achieve Goals: Fair  Plan Discharge plan needs to be updated    Co-evaluation                 AM-PAC OT "6 Clicks" Daily Activity     Outcome Measure   Help from another person eating meals?: None Help from another person taking care of personal grooming?: None Help from another person toileting, which includes using toliet, bedpan, or urinal?: A Lot Help from another person bathing (including washing, rinsing, drying)?: A Lot Help from another person to put on and taking off regular upper body clothing?: A Little Help from another person to put on and taking off regular lower body clothing?: A Lot 6 Click Score: 17    End of Session    OT Visit Diagnosis: Unsteadiness on feet (R26.81);Other abnormalities of gait and mobility (R26.89);Muscle weakness (generalized) (M62.81)   Activity Tolerance Patient limited by fatigue   Patient Left in bed;with call bell/phone within reach;with bed alarm set   Nurse Communication Mobility status        Time: 1478-2956 OT Time Calculation (min): 17 min  Charges: OT General Charges $OT Visit: 1 Visit OT Treatments $Self Care/Home Management : 8-22 mins  06/12/2022  RP, OTR/L  Acute Rehabilitation Services  Office:  [phone]   Glenn Olson 06/12/2022, 10:23 AM

## 2022-06-12 NOTE — Progress Notes (Signed)
Physical Therapy Treatment Patient Details Name: Glenn Olson MRN: [id number] DOB: April 19, 1940 Today's Date: 06/12/2022   History of Present Illness Glenn Olson is a 82 y.o. male admitted 5/1  presented to hospital with headache and chest pain for 1 week.  Patient was on hemodialysis but had stopped going for hemodialysis for 1 year now.  Has been having poor oral intake with nausea and vomiting.  In the ED, patient was mildly tachypneic and tachycardic with elevated blood pressure as well as hypokalemia.  Chest x-ray with increased left lower lower lobe markings.  HD reinitiated. afib with RVR developed 5/9, transferred to cardiac progressive. PMH: ESRD, neurogenic atonic bladder    PT Comments    Pt tolerated treatment well today. Pt was able ambulate in hallway with RW Min G level. Pt HR up to 141 during ambulation however pt was asymptomatic. No change in DC/DME recs at this time. PT will continue to follow.   Recommendations for follow up therapy are one component of a multi-disciplinary discharge planning process, led by the attending physician.  Recommendations may be updated based on patient status, additional functional criteria and insurance authorization.  Follow Up Recommendations  Can patient physically be transported by private vehicle: Yes    Assistance Recommended at Discharge Frequent or constant Supervision/Assistance  Patient can return home with the following A lot of help with walking and/or transfers;A lot of help with bathing/dressing/bathroom;Assistance with cooking/housework;Assist for transportation;Help with stairs or ramp for entrance   Equipment Recommendations  None recommended by PT    Recommendations for Other Services       Precautions / Restrictions Precautions Precautions: Fall Precaution Comments: watch HR Restrictions Weight Bearing Restrictions: No     Mobility  Bed Mobility Overal bed mobility: Needs Assistance Bed Mobility: Supine to  Sit, Sit to Supine     Supine to sit: Supervision Sit to supine: Supervision        Transfers Overall transfer level: Needs assistance Equipment used: Rolling walker (2 wheels) Transfers: Sit to/from Stand Sit to Stand: Min guard           General transfer comment: cues for hand placement    Ambulation/Gait Ambulation/Gait assistance: Min guard Gait Distance (Feet): 150 Feet Assistive device: Rolling walker (2 wheels) Gait Pattern/deviations: Step-through pattern, Decreased stride length, Trunk flexed, Drifts right/left Gait velocity: decr     General Gait Details: Cues for upright posture and proximity to [employer]    Modified Rankin (Stroke Patients Only)       Balance Overall balance assessment: Mild deficits observed, not formally tested                                          Cognition Arousal/Alertness: Awake/alert Behavior During Therapy: WFL for tasks assessed/performed Overall Cognitive Status: No family/caregiver present to determine baseline cognitive functioning                                          Exercises      General Comments General comments (skin integrity, edema, etc.): HR up to 141 during ambulation      Pertinent Vitals/Pain Pain Assessment Pain Assessment: No/denies pain    Home Living  Prior Function            PT Goals (current goals can now be found in the care plan section) Progress towards PT goals: Progressing toward goals    Frequency    Min 3X/week      PT Plan Current plan remains appropriate    Co-evaluation              AM-PAC PT "6 Clicks" Mobility   Outcome Measure  Help needed turning from your back to your side while in a flat bed without using bedrails?: A Little Help needed moving from lying on your back to sitting on the side of a flat bed without using bedrails?: A  Little Help needed moving to and from a bed to a chair (including a wheelchair)?: A Little Help needed standing up from a chair using your arms (e.g., wheelchair or bedside chair)?: A Little Help needed to walk in hospital room?: A Little Help needed climbing 3-5 steps with a railing? : A Lot 6 Click Score: 17    End of Session Equipment Utilized During Treatment: Gait belt Activity Tolerance: Patient tolerated treatment well Patient left: in bed;with call bell/phone within reach;with bed alarm set Nurse Communication: Mobility status PT Visit Diagnosis: Unsteadiness on feet (R26.81);Muscle weakness (generalized) (M62.81)     Time: 2725-3664 PT Time Calculation (min) (ACUTE ONLY): 14 min  Charges:  $Gait Training: 8-22 mins                     Shela Nevin, PT, DPT Acute Rehab Services [phone]    Gladys Damme 06/12/2022, 3:59 PM

## 2022-06-13 DIAGNOSIS — N19 Unspecified kidney failure: Secondary | ICD-10-CM | POA: Diagnosis not present

## 2022-06-13 LAB — RENAL FUNCTION PANEL
Albumin: 2.8 g/dL — ABNORMAL LOW (ref 3.5–5.0)
Anion gap: 15 (ref 5–15)
BUN: 52 mg/dL — ABNORMAL HIGH (ref 8–23)
CO2: 24 mmol/L (ref 22–32)
Calcium: 8.4 mg/dL — ABNORMAL LOW (ref 8.9–10.3)
Chloride: 95 mmol/L — ABNORMAL LOW (ref 98–111)
Creatinine, Ser: 4.93 mg/dL — ABNORMAL HIGH (ref 0.61–1.24)
GFR, Estimated: 11 mL/min — ABNORMAL LOW (ref >60)
Glucose, Bld: 98 mg/dL (ref 70–99)
Phosphorus: 3.7 mg/dL (ref 2.5–4.6)
Potassium: 5.1 mmol/L (ref 3.5–5.1)
Sodium: 134 mmol/L — ABNORMAL LOW (ref 135–145)

## 2022-06-13 LAB — CBC
HCT: 25.1 % — ABNORMAL LOW (ref 39.0–52.0)
Hemoglobin: 7.6 g/dL — ABNORMAL LOW (ref 13.0–17.0)
MCH: 29 pg (ref 26.0–34.0)
MCHC: 30.3 g/dL (ref 30.0–36.0)
MCV: 95.8 fL (ref 80.0–100.0)
Platelets: 204 10*3/uL (ref 150–400)
RBC: 2.62 MIL/uL — ABNORMAL LOW (ref 4.22–5.81)
RDW: 14.8 % (ref 11.5–15.5)
WBC: 6.5 10*3/uL (ref 4.0–10.5)
nRBC: 0 % (ref 0.0–0.2)

## 2022-06-13 LAB — MAGNESIUM: Magnesium: 1.9 mg/dL (ref 1.7–2.4)

## 2022-06-13 MED ORDER — DILTIAZEM HCL ER COATED BEADS 240 MG PO CP24
240.0000 mg | ORAL_CAPSULE | Freq: Every day | ORAL | Status: DC
  Administered 2022-06-14: 240 mg via ORAL
  Filled 2022-06-13: qty 1
  Filled 2022-06-13: qty 2
  Filled 2022-06-13: qty 1

## 2022-06-13 MED ORDER — DILTIAZEM HCL ER COATED BEADS 120 MG PO CP24
120.0000 mg | ORAL_CAPSULE | Freq: Once | ORAL | Status: AC
  Administered 2022-06-13: 120 mg via ORAL
  Filled 2022-06-13: qty 1

## 2022-06-13 NOTE — Progress Notes (Signed)
Inpatient Rehab Admissions Coordinator:   HR back in 120s-130s this morning.  Will continue to follow for readiness to transition to CIR.   Estill Dooms, PT, DPT Admissions Coordinator [phone] 06/13/22  9:41 AM

## 2022-06-13 NOTE — Progress Notes (Signed)
   06/13/22 0724  Assess: MEWS Score  Temp 97.9 F (36.6 C)  BP 125/86  MAP (mmHg) 98  Pulse Rate (!) 129  ECG Heart Rate (!) 130  Resp 20  SpO2 99 %  O2 Device Room Air  Assess: MEWS Score  MEWS Temp 0  MEWS Systolic 0  MEWS Pulse 3  MEWS RR 0  MEWS LOC 0  MEWS Score 3  MEWS Score Color Yellow  Assess: if the MEWS score is Yellow or Red  Were vital signs taken at a resting state? Yes  Focused Assessment No change from prior assessment  Does the patient meet 2 or more of the SIRS criteria? No  Does the patient have a confirmed or suspected source of infection? No  MEWS guidelines implemented  Yes, yellow  Treat  MEWS Interventions Considered administering scheduled or prn medications/treatments as ordered  Take Vital Signs  Increase Vital Sign Frequency  Yellow: Q2hr x1, continue Q4hrs until patient remains green for 12hrs  Escalate  MEWS: Escalate Yellow: Discuss with charge nurse and consider notifying provider and/or RRT  Notify: Charge Nurse/RN  Name of Charge Nurse/RN Notified Bryan  Assess: SIRS CRITERIA  SIRS Temperature  0  SIRS Pulse 1  SIRS Respirations  0  SIRS WBC 0  SIRS Score Sum  1    Pt transfer to HD at 09:15 - PO Cardizem to be administered when pt return to unit after HD.

## 2022-06-13 NOTE — Procedures (Signed)
Patient seen and examined on Hemodialysis. The procedure was supervised and I have made appropriate changes.  BP 108/66 (BP Location: Right Arm)   Pulse (!) 111   Temp 97.9 F (36.6 C) (Oral)   Resp (!) 21   Ht 5\' 9"  (1.753 m)   Wt 75.6 kg Comment: bed  SpO2 94%   BMI 24.61 kg/m   QB 300 mL/ min via 2 16 g needles, UF goal 1L  Tolerating treatment without complaints at this time.  Got cardizem for HR of 130  Bufford Buttner MD [facility] [phone] 11:57 AM

## 2022-06-13 NOTE — Progress Notes (Signed)
Pt HR 132 prn Cardizem BFR 300 per DR. Signe Colt. Successful cannulation of graft with 16g needles.

## 2022-06-13 NOTE — Progress Notes (Signed)
Mobility Specialist: Progress Note   06/13/22 1656  Mobility  Activity Ambulated with assistance in hallway  Level of Assistance Minimal assist, patient does 75% or more  Assistive Device Front wheel walker  Distance Ambulated (ft) 100 ft  Activity Response Tolerated well  Mobility Referral Yes  $Mobility charge 1 Mobility  Mobility Specialist Start Time (ACUTE ONLY) 1645  Mobility Specialist Stop Time (ACUTE ONLY) 1656  Mobility Specialist Time Calculation (min) (ACUTE ONLY) 11 min   Pre-Mobility: 127 HR During Mobility: 137 HR Post-Mobility: 118 HR, 93% SpO2  Pt received in the bed and agreeable to mobility. Mod I with bed mobility and minA to stand. No c/o throughout. Pt back to bed after session with call bell and phone at his side. Bed alarm is on.   Glenn Olson Mobility Specialist Please contact via SecureChat or Rehab office at [phone]

## 2022-06-13 NOTE — Progress Notes (Signed)
PROGRESS NOTE Glenn Olson  UJW:[id number] DOB: 1940/04/14 DOA: 05/30/2022 PCP: Benita Stabile, MD  Brief Narrative/Hospital Course: 82 y.o. male with medical history significant for ESRD, neurogenic atonic bladder presented to [facility] with headache and chest pain for 1 week.  Patient was on hemodialysis but had stopped going for hemodialysis for 1 year now.  Has been having poor oral intake with nausea and vomiting.  In the ED, patient was mildly tachypneic and tachycardic with elevated blood pressure.  Chest x-ray with increased left lower lower lobe markings. CO2 less than 7.  BUN of 241.  Potassium 4.5.  Cr 13.8.Troponin 48 > 52.  Nephrology was consulted and decision was to proceed with hemodialysis.  Patient was then transferred to [facility].   Patient has had HD cath placed and started on hemodialysis.  Encephalopathy improving. Hospital course complicated by A-fib with RVR.  He was started on p.o. Cardizem and IV heparin.  TTE without significant finding.  RVR improved. Transitioned to p.o. Cardizem CD, Eliquis and signed off by cardiology. Patient was treated for anemia of CKD, hypertension, secondary hyperparathyroidism with hypocalcemia and hyperphosphatemia. Remains weak and deconditioned with debility PT OT recommending CIR and awaiting for placement    Subjective: Seen and examined this morning in dialysis, alert awake resting comfortably heart rate remains poorly controlled   Assessment and Plan: Principal Problem:   Uremia Active Problems:   Metabolic acidosis   Chest pain   Atonic neurogenic bladder   ESRD on hemodialysis (HCC)   Chronic indwelling Foley catheter   Hypokalemia   Hypomagnesemia   Typical atrial flutter (HCC)   Persistent atrial fibrillation (HCC)  Acute uremic encephalopathy due to ESRD after being off HD for a year:Mentation improved stable continue supportive care fall precaution.  ESRD: Continue HD per nephrology, making some urine.   TDC in place with VVS, using his left AVG for HD today.   New onset a flutter/A-fib with RVR: TTE without significant finding.  HR remains borderline controlled continue Eliquis monitor electrolytes, Cardizem will be increased to 240 mg will give additional 120 mg x 1, patient on as needed Cardizem p.o. goal rate less than 110 .Cardiology signed off.   Hypokalemia/hypocalcemia/hypomagnesemia: Monitor and replete. Hyperkalemia-resolved Recent Labs  Lab 06/08/22 0140 06/09/22 0102 06/10/22 0112 06/11/22 0943 06/12/22 0157 06/13/22 0156  K 4.0 4.3 4.7 5.4* 5.2* 5.1  CALCIUM 7.7* 7.7* 8.1* 8.3* 8.4* 8.4*  MG 1.8 1.8 1.8  --  1.6* 1.9  PHOS 3.2 2.3* 2.8 3.6 2.9 3.7       Component Value Date/Time   PTH 386 (H) 06/04/2022 0706   PTH 495 (H) 06/02/2022 0411     Atonic neurogenic bladder: cont SP catheter Anemia of chronic kidney disease hemoglobin currently stable received 1 unit PRBC previously. Recent Labs  Lab 06/09/22 0102 06/10/22 0112 06/11/22 0123 06/12/22 0157 06/13/22 0156  HGB 7.6* 7.0* 7.4* 7.8* 7.6*  HCT 24.7* 22.8* 23.9* 25.2* 25.1*     Deconditioning, debility: Overall improving, awaiting for CIR improved.   Thrombocytopenia: Resolved.  DVT prophylaxis: apixaban (ELIQUIS) tablet 2.5 mg Start: 06/09/22 1700 SCDs Start: 05/30/22 1926 Code Status:   Code Status: DNR Family Communication: plan of care discussed with patient at bedside. Patient status is:  inpatient because of awaiting CIR Level of care: Telemetry Cardiac   Dispo: The patient is from: home            Anticipated disposition: CIR pending Objective: Vitals last 24 hrs: Vitals:  06/13/22 1230 06/13/22 1300 06/13/22 1331 06/13/22 1334  BP: 105/79 117/71 111/79 122/80  Pulse: (!) 135 (!) 103 (!) 133 (!) 101  Resp: (!) 25 19 (!) 22 19  Temp:   98 F (36.7 C)   TempSrc:   Oral   SpO2: 97% 96% 96% 95%  Weight:      Height:       Weight change: -1.9 kg  Physical Examination: General exam:  alert awake, older than stated age HEENT:Oral mucosa moist, Ear/Nose WNL grossly Respiratory system: bilaterally clear BS, no use of accessory muscle Cardiovascular system: S1 & S2 +, No JVD. Gastrointestinal system: Abdomen soft,NT,ND, BS+ Nervous System:Alert, awake, moving extremities. Extremities: LE edema neg,distal peripheral pulses palpable.  Skin: No rashes,no icterus. MSK: Normal muscle bulk,tone, power Left AVG present and TDC present  Medications reviewed:  Scheduled Meds:  sodium chloride   Intravenous Once   apixaban  2.5 mg Oral BID   calcitRIOL  0.5 mcg Oral Daily   calcium acetate  667 mg Oral TID WC   Chlorhexidine Gluconate Cloth  6 each Topical Q0600   [START ON 06/15/2022] darbepoetin (ARANESP) injection - DIALYSIS  100 mcg Subcutaneous Q Fri-1800   diltiazem  120 mg Oral Once   [START ON 06/14/2022] diltiazem  240 mg Oral Daily   feeding supplement (NEPRO CARB STEADY)  237 mL Oral BID BM   loratadine  10 mg Oral Daily   Continuous Infusions:    Diet Order             Diet regular Room service appropriate? Yes with Assist; Fluid consistency: Thin; Fluid restriction: 1200 mL Fluid  Diet effective now                    Nutrition Problem: Inadequate oral intake Etiology: poor appetite Signs/Symptoms: meal completion < 25% Interventions: Nepro shake   Intake/Output Summary (Last 24 hours) at 06/13/2022 1421 Last data filed at 06/13/2022 1335 Gross per 24 hour  Intake 340 ml  Output 3050 ml  Net -2710 ml   Net IO Since Admission: -6,486 mL [06/13/22 1421]  Wt Readings from Last 3 Encounters:  06/13/22 75.6 kg  03/17/21 80.3 kg  02/22/21 77.6 kg     Unresulted Labs (From admission, onward)     Start     Ordered   06/06/22 0500  CBC  Daily,   R     Question:  Specimen collection method  Answer:  Lab=Lab collect   06/05/22 1617          Data Reviewed: I have personally reviewed following labs and imaging studies CBC: Recent Labs  Lab  06/09/22 0102 06/10/22 0112 06/11/22 0123 06/12/22 0157 06/13/22 0156  WBC 7.4 7.1 7.7 7.5 6.5  HGB 7.6* 7.0* 7.4* 7.8* 7.6*  HCT 24.7* 22.8* 23.9* 25.2* 25.1*  MCV 95.7 95.0 95.6 95.8 95.8  PLT 151 154 174 199 204   Basic Metabolic Panel: Recent Labs  Lab 06/08/22 0140 06/09/22 0102 06/10/22 0112 06/11/22 0943 06/12/22 0157 06/13/22 0156  NA 135 135 133* 136 135 134*  K 4.0 4.3 4.7 5.4* 5.2* 5.1  CL 95* 98 98 101 96* 95*  CO2 24 27 25 25 27 24   GLUCOSE 111* 102* 101* 97 101* 98  BUN 45* 27* 45* 65* 31* 52*  CREATININE 5.54* 3.68* 5.30* 6.73* 3.86* 4.93*  CALCIUM 7.7* 7.7* 8.1* 8.3* 8.4* 8.4*  MG 1.8 1.8 1.8  --  1.6* 1.9  PHOS 3.2 2.3*  2.8 3.6 2.9 3.7   Recent Labs  Lab 06/09/22 0102 06/10/22 0112 06/11/22 0943 06/12/22 0157 06/13/22 0156  ALBUMIN 2.7* 2.7* 2.7* 2.8* 2.8*   Recent Results (from the past 240 hour(s))  MRSA Next Gen by PCR, Nasal     Status: None   Collection Time: 06/04/22  8:36 AM   Specimen: Nasal Mucosa; Nasal Swab  Result Value Ref Range Status   MRSA by PCR Next Gen NOT DETECTED NOT DETECTED Final    Comment: (NOTE) The GeneXpert MRSA Assay (FDA approved for NASAL specimens only), is one component of a comprehensive MRSA colonization surveillance program. It is not intended to diagnose MRSA infection nor to guide or monitor treatment for MRSA infections. Test performance is not FDA approved in patients less than 53 years old. Performed at [facility], [address] [place], [place] [address]     Antimicrobials: Anti-infectives (From admission, onward)    Start     Dose/Rate Route Frequency Ordered Stop   05/31/22 1115  ceFAZolin (ANCEF) IVPB 2g/100 mL premix        2 g 200 mL/hr over 30 Minutes Intravenous  Once 05/31/22 1108 05/31/22 1200   05/31/22 1112  ceFAZolin (ANCEF) 2-4 GM/100ML-% IVPB       Note to Pharmacy: Shanda Bumps M: cabinet override      05/31/22 1112 05/31/22 1206      Culture/Microbiology     Component Value Date/Time   SDES  11/29/2020 1705    URINE, CLEAN CATCH Performed at [facility], [address] [place], [place] [address]    [facility]  11/29/2020 1705    NONE Performed at [facility], [address] [place], [place] [address]    CULT MULTIPLE SPECIES PRESENT, SUGGEST RECOLLECTION (A) 11/29/2020 1705   REPTSTATUS 12/01/2020 FINAL 11/29/2020 1705  Radiology Studies: No results found.   LOS: 14 days   Lanae Boast, MD [facility]  06/13/2022, 2:21 PM

## 2022-06-13 NOTE — Progress Notes (Signed)
Received patient in bed to unit.  Alert and oriented.  Informed consent signed and in chart.   TX duration:3.5  Patient tolerated well.  Transported back to the room  Alert, without acute distress.  Hand-off given to patient's nurse.   Access used: left ACG Access issues: none  Total UF removed: 1L Medication(s) given: calcitriol, Cardizem   06/13/22 1331  Vitals  Temp 98 F (36.7 C)  Temp Source Oral  BP 111/79  MAP (mmHg) 91  BP Location Right Arm  BP Method Automatic  Patient Position (if appropriate) Lying  Pulse Rate (!) 133  Pulse Rate Source Monitor  ECG Heart Rate (!) 134  Resp (!) 22  Oxygen Therapy  SpO2 96 %  O2 Device Room Air  During Treatment Monitoring  HD Safety Checks Performed Yes  Intra-Hemodialysis Comments Tx completed;Tolerated well  Dialysis Fluid Bolus Normal Saline  Bolus Amount (mL) 300 mL      Ada Woodbury S [facility]

## 2022-06-13 NOTE — Progress Notes (Signed)
[facility] Progress Note   Assessment/ Plan:   ESRD: History of such and was off dialysis for some time. Looked into PD but not an option given adhesions. Now ESRD again and agreeing to dialysis -Continue with dialysis MWF -Outpatient dialysis arranged at [facility] but waiting for formal discharge plan given possibly going to CIR -[facility] in place with VVS.  Appreciate help -Using AVG with 2 16 g needles today- working fine   Atrial fibrillation with RVR: Persistent tachycardia.  Management per primary team   Anemia of CKD: Persistently low hemoglobin.  Iron depleted and IV iron as ordered.  Increase ESA due on Friday.   Hypertension: Blood pressure acceptable on current medications.   Secondary hyperparathyroidism: With hypocalcemia and hyperphosphatemia.  On PhosLo.  Started calcitriol 0.5 mcg daily.  Ca and phos much improved. Decrease phosloContinue to monitor  Subjective:    Seen in dialysis.  No complaints so far.  HR up and down   Objective:   BP 108/66 (BP Location: Right Arm)   Pulse (!) 111   Temp 97.9 F (36.6 C) (Oral)   Resp (!) 21   Ht 5\' 9"  (1.753 m)   Wt 75.6 kg Comment: bed  SpO2 94%   BMI 24.61 kg/m   Intake/Output Summary (Last 24 hours) at 06/13/2022 1156 Last data filed at 06/13/2022 0759 Gross per 24 hour  Intake 340 ml  Output 2050 ml  Net -1710 ml   Weight change: -1.9 kg  Physical Exam: WUJ:WJXBJ appearing CVS: irregular Resp: clear Abd: soft Ext: no LE edema ACCESS: TDC  Imaging: No results found.  Labs: BMET Recent Labs  Lab 06/07/22 0809 06/08/22 0140 06/09/22 0102 06/10/22 0112 06/11/22 0943 06/12/22 0157 06/13/22 0156  NA 134* 135 135 133* 136 135 134*  K 3.7 4.0 4.3 4.7 5.4* 5.2* 5.1  CL 98 95* 98 98 101 96* 95*  CO2 24 24 27 25 25 27 24   GLUCOSE 106* 111* 102* 101* 97 101* 98  BUN 38* 45* 27* 45* 65* 31* 52*  CREATININE 4.94* 5.54* 3.68* 5.30* 6.73* 3.86* 4.93*  CALCIUM 7.3* 7.7* 7.7* 8.1* 8.3* 8.4* 8.4*  PHOS  3.1 3.2 2.3* 2.8 3.6 2.9 3.7   CBC Recent Labs  Lab 06/10/22 0112 06/11/22 0123 06/12/22 0157 06/13/22 0156  WBC 7.1 7.7 7.5 6.5  HGB 7.0* 7.4* 7.8* 7.6*  HCT 22.8* 23.9* 25.2* 25.1*  MCV 95.0 95.6 95.8 95.8  PLT 154 174 199 204    Medications:     sodium chloride   Intravenous Once   apixaban  2.5 mg Oral BID   calcitRIOL  0.5 mcg Oral Daily   calcium acetate  667 mg Oral TID WC   Chlorhexidine Gluconate Cloth  6 each Topical Q0600   [START ON 06/15/2022] darbepoetin (ARANESP) injection - DIALYSIS  100 mcg Subcutaneous Q Fri-1800   diltiazem  120 mg Oral Once   [START ON 06/14/2022] diltiazem  240 mg Oral Daily   feeding supplement (NEPRO CARB STEADY)  237 mL Oral BID BM   loratadine  10 mg Oral Daily   Bufford Buttner MD 06/13/2022, 11:56 AM

## 2022-06-14 DIAGNOSIS — N19 Unspecified kidney failure: Secondary | ICD-10-CM | POA: Diagnosis not present

## 2022-06-14 LAB — CBC
HCT: 26.2 % — ABNORMAL LOW (ref 39.0–52.0)
Hemoglobin: 8.1 g/dL — ABNORMAL LOW (ref 13.0–17.0)
MCH: 29.8 pg (ref 26.0–34.0)
MCHC: 30.9 g/dL (ref 30.0–36.0)
MCV: 96.3 fL (ref 80.0–100.0)
Platelets: 241 10*3/uL (ref 150–400)
RBC: 2.72 MIL/uL — ABNORMAL LOW (ref 4.22–5.81)
RDW: 15.1 % (ref 11.5–15.5)
WBC: 6.6 10*3/uL (ref 4.0–10.5)
nRBC: 0 % (ref 0.0–0.2)

## 2022-06-14 NOTE — Progress Notes (Signed)
Reviewed note from rehab coordinator regarding possible tx to CIR. Contacted pt's son, Nadine Counts, via phone to discuss out-pt HD arrangements. Pt's son advised pt has been accepted at [facility] in [place] on TTS 6:10 am chair time. Son agreeable to arrangements. Will provide update to clinic and [facility] admissions regarding plan for CIR then d/c home. Will confirm start date with clinic once d/c date is known. Will assist as needed.   Olivia Canter Renal Navigator [phone]

## 2022-06-14 NOTE — Progress Notes (Signed)
Nutrition Follow-up  DOCUMENTATION CODES:   Non-severe (moderate) malnutrition in context of chronic illness  INTERVENTION:  Encourage adequate PO intake with emphasis on protein with all meals and snacks Discontinue nepro shakes Carnation Breakfast Essentials TID, each packet mixed with 8 ounces of 2% milk provides 13 grams of protein and 260 calories. 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 - updated 5/16  GOAL:   Patient will meet greater than or equal to 90% of their needs - progressing  MONITOR:   PO intake, Supplement acceptance, Labs, Weight trends, I & O's  REASON FOR ASSESSMENT:   Consult Diet education  ASSESSMENT:   82 y.o. male admits related to chest pain and headache. PMH includes: anemia, CKD, dysrhythmia, GERD, neurogenic bladder. Pt is currently receiving medical management related to uremia.  Pt had been on HD previously but was off dialysis for some time. TDC placed and restarted on HD during admission.   Plans for d/c to CIR likely tomorrow pending ongoing stability of HR as pt became tachycardic yesterday.   Spoke with pt at bedside. He reports eating well during admission but states "it's not enough food." He recalls eating 100% of all 3 meals daily. Declines nepro and offer for ensure as he does not like the taste of these supplements.   Meal completions: 5/6: 5% lunch and dinner 5/9-5/15: 100% x 4 recorded meals 5/15: 90% lunch 5/16: 90% breakfast  At home he recalls having an appetite that was up and down. He would eat when he wanted. His typical intakes included a big breakfast and snacks throughout the day. Denies difficulty  chewing and swallowing.   Encouraged adequate protein intake with all meals for muscle maintenance and overall optimal nutritional status.   Pt states that his weight remained stable around 160 lbs. Denies significant weight changes.   5/15: Post HD net UF 1L Current weight: 71.6 kg  Medications: calcitriol, phoslo  Labs: sodium 134, BUN 52, Cr 4.93, GFR 11  UOP: 1843m x24 hours I/O's: - since 5/2  NUTRITION - FOCUSED PHYSICAL EXAM:  Flowsheet Row Most Recent Value  Orbital Region Moderate depletion  Upper Arm Region Severe depletion  Thoracic and Lumbar Region Moderate depletion  Buccal Region Moderate depletion  Temple Region Moderate depletion  Clavicle Bone Region Severe depletion  Clavicle and Acromion Bone Region Severe depletion  Scapular Bone Region Moderate depletion  Dorsal Hand Mild depletion  Patellar Region Severe depletion  Anterior Thigh Region Severe depletion  Posterior Calf Region Mild depletion  Edema (RD Assessment) None  Hair Reviewed  Eyes Reviewed  Mouth Reviewed  Skin Reviewed  Nails Reviewed       Diet Order:   Diet Order             Diet regular Room service appropriate? Yes with Assist; Fluid consistency: Thin; Fluid restriction: 1200 mL Fluid  Diet effective now                   EDUCATION NEEDS:   Education needs have been addressed  Skin:  Skin Assessment: Skin Integrity Issues: Skin Integrity Issues:: Incisions Incisions: right neck  Last BM:  5/15 (type 6/7)  Height:   Ht Readings from Last  1 Encounters:  05/30/22 5\' 9"  (1.753 m)    Weight:   Wt Readings from Last 1 Encounters:  06/14/22 71.6 kg   BMI:  Body mass index is 23.31 kg/m.  Estimated Nutritional Needs:   Kcal:  2000-2200  Protein:  100-115g  Fluid:  1L + UOP  Drusilla Kanner, RDN, LDN Clinical Nutrition

## 2022-06-14 NOTE — Care Management Important Message (Signed)
Important Message  Patient Details  Name: Sanjaya Philemon MRN: [id number] Date of Birth: 1940-07-30   Medicare Important Message Given:  Yes     Renie Ora 06/14/2022, 11:41 AM

## 2022-06-14 NOTE — Progress Notes (Signed)
[facility] Progress Note   Assessment/ Plan:   ESRD: History of such and was off dialysis for some time. Looked into PD but not an option given adhesions. Now ESRD again and agreeing to dialysis -Continue with dialysis MWF -Outpatient dialysis arranged at [facility] but waiting for formal discharge plan given possibly going to CIR -[facility] in place with VVS.  Appreciate help -used AVG 5/15, will do again 5/17   Atrial fibrillation with RVR: Persistent tachycardia.  Management per primary team   Anemia of CKD: Persistently low hemoglobin.  Iron depleted and IV iron as ordered.  Increase ESA due on Friday.   Hypertension: Blood pressure acceptable on current medications.   Secondary hyperparathyroidism: With hypocalcemia and hyperphosphatemia.  On PhosLo.  Started calcitriol 0.5 mcg daily.  Ca and phos much improved. Decrease phosloContinue to monitor  Subjective:    Seen in room- no complaints   Objective:   BP 117/69 (BP Location: Right Arm)   Pulse 95   Temp 98 F (36.7 C) (Oral)   Resp 14   Ht 5\' 9"  (1.753 m)   Wt 71.6 kg   SpO2 99%   BMI 23.31 kg/m   Intake/Output Summary (Last 24 hours) at 06/14/2022 1206 Last data filed at 06/14/2022 1031 Gross per 24 hour  Intake 840 ml  Output 2851 ml  Net -2011 ml   Weight change: 3.3 kg  Physical Exam: [id number] appearing CVS: irregular Resp: clear Abd: soft Ext: no LE edema ACCESS: TDC, AVG  Imaging: No results found.  Labs: BMET Recent Labs  Lab 06/08/22 0140 06/09/22 0102 06/10/22 0112 06/11/22 0943 06/12/22 0157 06/13/22 0156  NA 135 135 133* 136 135 134*  K 4.0 4.3 4.7 5.4* 5.2* 5.1  CL 95* 98 98 101 96* 95*  CO2 24 27 25 25 27 24   GLUCOSE 111* 102* 101* 97 101* 98  BUN 45* 27* 45* 65* 31* 52*  CREATININE 5.54* 3.68* 5.30* 6.73* 3.86* 4.93*  CALCIUM 7.7* 7.7* 8.1* 8.3* 8.4* 8.4*  PHOS 3.2 2.3* 2.8 3.6 2.9 3.7   CBC Recent Labs  Lab 06/11/22 0123 06/12/22 0157 06/13/22 0156 06/14/22 0154   WBC 7.7 7.5 6.5 6.6  HGB 7.4* 7.8* 7.6* 8.1*  HCT 23.9* 25.2* 25.1* 26.2*  MCV 95.6 95.8 95.8 96.3  PLT 174 199 204 241    Medications:     sodium chloride   Intravenous Once   apixaban  2.5 mg Oral BID   calcitRIOL  0.5 mcg Oral Daily   calcium acetate  667 mg Oral TID WC   Chlorhexidine Gluconate Cloth  6 each Topical Q0600   [START ON 06/15/2022] darbepoetin (ARANESP) injection - DIALYSIS  100 mcg Subcutaneous Q Fri-1800   diltiazem  240 mg Oral Daily   feeding supplement (NEPRO CARB STEADY)  237 mL Oral BID BM   loratadine  10 mg Oral Daily   Bufford Buttner MD 06/14/2022, 12:06 PM

## 2022-06-14 NOTE — Plan of Care (Signed)

## 2022-06-14 NOTE — Progress Notes (Signed)
Inpatient Rehab Admissions Coordinator:   HR appears to be more controlled.  Discussed with rehab MDs ([facility]) and both feel need to observe for rate control at least 24 hours.  Will follow for possible admit tomorrow.  Estill Dooms, PT, DPT Admissions Coordinator [phone] 06/14/22  9:43 AM

## 2022-06-14 NOTE — Progress Notes (Signed)
Physical Therapy Treatment Patient Details Name: Glenn Olson MRN: [id number] DOB: July 24, 1940 Today's Date: 06/14/2022   History of Present Illness Glenn Olson is a 82 y.o. male admitted 5/1  presented to hospital with headache and chest pain for 1 week.  Patient was on hemodialysis but had stopped going for hemodialysis for 1 year now.  Has been having poor oral intake with nausea and vomiting.  In the ED, patient was mildly tachypneic and tachycardic with elevated blood pressure as well as hypokalemia.  Chest x-ray with increased left lower lower lobe markings.  HD reinitiated. afib with RVR developed 5/9, transferred to cardiac progressive. PMH: ESRD, neurogenic atonic bladder    PT Comments    Pt dozing on entry, rouses easily and is agreeable to ambulation with therapy. HR at rest 90bpm. Pt is supervision with bed mobility and min guard with transfers and ambulation. Pt reports fatigue with ambulation, HR in 130s and after brief rest break request to turn around and return to bed. Pt reports frustration with level of fatigue with short bouts of activity. Provided education about fatigue being a common occurrence with A-fib. D/c plans remain appropriate.     Recommendations for follow up therapy are one component of a multi-disciplinary discharge planning process, led by the attending physician.  Recommendations may be updated based on patient status, additional functional criteria and insurance authorization.  Follow Up Recommendations  Can patient physically be transported by private vehicle: Yes    Assistance Recommended at Discharge Frequent or constant Supervision/Assistance  Patient can return home with the following A lot of help with walking and/or transfers;A lot of help with bathing/dressing/bathroom;Assistance with cooking/housework;Assist for transportation;Help with stairs or ramp for entrance   Equipment Recommendations  None recommended by PT       Precautions /  Restrictions Precautions Precautions: Fall Precaution Comments: watch HR Restrictions Weight Bearing Restrictions: No     Mobility  Bed Mobility Overal bed mobility: Needs Assistance Bed Mobility: Supine to Sit, Sit to Supine     Supine to sit: Supervision Sit to supine: Supervision        Transfers Overall transfer level: Needs assistance Equipment used: Rolling walker (2 wheels) Transfers: Sit to/from Stand Sit to Stand: Min guard           General transfer comment: vc for one hand on bed surface to power up    Ambulation/Gait Ambulation/Gait assistance: Min guard Gait Distance (Feet): 75 Feet (x2) Assistive device: Rolling walker (2 wheels) Gait Pattern/deviations: Step-through pattern, Decreased stride length, Trunk flexed, Drifts right/left Gait velocity: decr Gait velocity interpretation: <1.8 ft/sec, indicate of risk for recurrent falls   General Gait Details: increased fatigue with ambulation, requires 1x standing rest break with ambulation         Balance Overall balance assessment: Mild deficits observed, not formally tested                                          Cognition Arousal/Alertness: Awake/alert Behavior During Therapy: WFL for tasks assessed/performed Overall Cognitive Status: No family/caregiver present to determine baseline cognitive functioning                                             General Comments General comments (skin integrity, edema, etc.):  max noted HR 138bpm, at rest 90bpm      Pertinent Vitals/Pain Pain Assessment Pain Assessment: No/denies pain     PT Goals (current goals can now be found in the care plan section) Acute Rehab PT Goals PT Goal Formulation: With patient/family Time For Goal Achievement: 06/17/22 Potential to Achieve Goals: Fair Progress towards PT goals: Progressing toward goals    Frequency    Min 3X/week      PT Plan Current plan remains  appropriate       AM-PAC PT "6 Clicks" Mobility   Outcome Measure  Help needed turning from your back to your side while in a flat bed without using bedrails?: A Little Help needed moving from lying on your back to sitting on the side of a flat bed without using bedrails?: A Little Help needed moving to and from a bed to a chair (including a wheelchair)?: A Little Help needed standing up from a chair using your arms (e.g., wheelchair or bedside chair)?: A Little Help needed to walk in hospital room?: A Little Help needed climbing 3-5 steps with a railing? : A Lot 6 Click Score: 17    End of Session Equipment Utilized During Treatment: Gait belt Activity Tolerance: Patient tolerated treatment well Patient left: in bed;with call bell/phone within reach;with bed alarm set Nurse Communication: Mobility status PT Visit Diagnosis: Unsteadiness on feet (R26.81);Muscle weakness (generalized) (M62.81)     Time: 0981-1914 PT Time Calculation (min) (ACUTE ONLY): 11 min  Charges:  $Therapeutic Exercise: 8-22 mins                     Glenn Olson B. Beverely Risen PT, DPT Acute Rehabilitation Services Please use secure chat or  Call Office [phone]    [name] 06/14/2022, 2:50 PM

## 2022-06-14 NOTE — Progress Notes (Addendum)
PROGRESS NOTE Glenn Olson  ZOX:[id number] DOB: [birth year] DOA: 05/30/2022 PCP: Benita Stabile, MD  Brief Narrative/Hospital Course: 82 y.o. male with medical history significant for ESRD, neurogenic atonic bladder presented to [facility] with headache and chest pain for 1 week.  Patient was on hemodialysis but had stopped going for hemodialysis for 1 year now.  Has been having poor oral intake with nausea and vomiting.  In the ED, patient was mildly tachypneic and tachycardic with elevated blood pressure.  Chest x-ray with increased left lower lower lobe markings. CO2 less than 7.  BUN of 241.  Potassium 4.5.  Cr 13.8.Troponin 48 > 52.  Nephrology was consulted and decision was to proceed with hemodialysis.  Patient was then transferred to [facility].   Patient has had HD cath placed and started on hemodialysis.  Encephalopathy improving. Hospital course complicated by A-fib with RVR.  He was started on p.o. Cardizem and IV heparin.  TTE without significant finding.  RVR improved. Transitioned to p.o. Cardizem CD, Eliquis and signed off by cardiology. Patient was treated for anemia of CKD, hypertension, secondary hyperparathyroidism with hypocalcemia and hyperphosphatemia. Remains weak and deconditioned with debility PT OT recommending CIR and awaiting for placement    Subjective: Seen and examined Resting well no new complaints  Assessment and Plan: Principal Problem:   Uremia Active Problems:   Metabolic acidosis   Chest pain   Atonic neurogenic bladder   ESRD on hemodialysis (HCC)   Chronic indwelling Foley catheter   Hypokalemia   Hypomagnesemia   Typical atrial flutter (HCC)   Persistent atrial fibrillation (HCC)  Acute uremic encephalopathy due to ESRD after being off HD for a year: mentation stable. Cont supportive care fall precaution and PTIOT.  ESRD:Continue HD per nephrology,making some urine.TDC in place with VVS,using his left AVG for HD Secondary  hyperparathyroidism in the setting of ESRD continue per nephrology  New onset a flutter/A-fib with RVR:TTE without significant finding. HR is better controlled cardizem increased from one 180 > 240 mg CD on 5/15, on prn cardizem as well.  Continue Eliquis.  Cardiology signed off.     Hypokalemia/hypocalcemia/hypomagnesemia: Improved K on higher side-adjust with HD Hyperkalemia-resolved Recent Labs  Lab 06/08/22 0140 06/09/22 0102 06/10/22 0112 06/11/22 0943 06/12/22 0157 06/13/22 0156  K 4.0 4.3 4.7 5.4* 5.2* 5.1  CALCIUM 7.7* 7.7* 8.1* 8.3* 8.4* 8.4*  MG 1.8 1.8 1.8  --  1.6* 1.9  PHOS 3.2 2.3* 2.8 3.6 2.9 3.7   Atonic neurogenic bladder: cont SP catheter Anemia of chronic kidney disease hbstable, s/p 1 unit prbc 06/01/22 Recent Labs  Lab 06/10/22 0112 06/11/22 0123 06/12/22 0157 06/13/22 0156 06/14/22 0154  HGB 7.0* 7.4* 7.8* 7.6* 8.1*  HCT 22.8* 23.9* 25.2* 25.1* 26.2*   Deconditioning, debility: Continue with PT OT awaiting for CIR.     Thrombocytopenia: Resolved.  DVT prophylaxis: apixaban (ELIQUIS) tablet 2.5 mg Start: 06/09/22 1700 SCDs Start: 05/30/22 1926 Code Status:   Code Status: DNR Family Communication: plan of care discussed with patient at bedside. Patient status is:  inpatient because of awaiting CIR Level of care: Telemetry Cardiac   Dispo: The patient is from: home            Anticipated disposition: CIR pending Objective: Vitals last 24 hrs: Vitals:   06/13/22 2342 06/14/22 0331 06/14/22 0531 06/14/22 0716  BP: 132/79 115/82  122/80  Pulse: 89 98  (!) 104  Resp: 18 16 20 20   Temp: 98.3 F (36.8 C)  97.7 F (36.5 C)  98 F (36.7 C)  TempSrc: Oral Oral  Oral  SpO2: 93% 92%  98%  Weight:   71.6 kg   Height:       Weight change: 3.3 kg  Physical Examination: General exam: AAox3, weak,older appearing HEENT:Oral mucosa moist, Ear/Nose WNL grossly, dentition normal. Respiratory system: bilaterally clear BS, no use of accessory  muscle Cardiovascular system: S1 & S2 +, irregular rate at times tachycardic. Gastrointestinal system: Abdomen soft, NT,ND,BS+ Nervous System:Alert, awake, moving extremities and grossly nonfocal Extremities: LE ankle edema NEG, lower extremities warm Skin: No rashes,no icterus. MSK: Normal muscle bulk,tone, power  Left arm AV graft and hd CATHETER ON CHEST  Medications reviewed:  Scheduled Meds:  sodium chloride   Intravenous Once   apixaban  2.5 mg Oral BID   calcitRIOL  0.5 mcg Oral Daily   calcium acetate  667 mg Oral TID WC   Chlorhexidine Gluconate Cloth  6 each Topical Q0600   [START ON 06/15/2022] darbepoetin (ARANESP) injection - DIALYSIS  100 mcg Subcutaneous Q Fri-1800   diltiazem  240 mg Oral Daily   feeding supplement (NEPRO CARB STEADY)  237 mL Oral BID BM   loratadine  10 mg Oral Daily   Continuous Infusions:    Diet Order             Diet regular Room service appropriate? Yes with Assist; Fluid consistency: Thin; Fluid restriction: 1200 mL Fluid  Diet effective now                    Nutrition Problem: Inadequate oral intake Etiology: poor appetite Signs/Symptoms: meal completion < 25% Interventions: Nepro shake   Intake/Output Summary (Last 24 hours) at 06/14/2022 1045 Last data filed at 06/14/2022 1031 Gross per 24 hour  Intake 840 ml  Output 2851 ml  Net -2011 ml    Net IO Since Admission: -7,497 mL [06/14/22 1045]  Wt Readings from Last 3 Encounters:  06/14/22 71.6 kg  03/17/21 80.3 kg  02/22/21 77.6 kg     Unresulted Labs (From admission, onward)     Start     Ordered   06/06/22 0500  CBC  Daily,   R     Question:  Specimen collection method  Answer:  Lab=Lab collect   06/05/22 1617          Data Reviewed: I have personally reviewed following labs and imaging studies CBC: Recent Labs  Lab 06/10/22 0112 06/11/22 0123 06/12/22 0157 06/13/22 0156 06/14/22 0154  WBC 7.1 7.7 7.5 6.5 6.6  HGB 7.0* 7.4* 7.8* 7.6* 8.1*  HCT  22.8* 23.9* 25.2* 25.1* 26.2*  MCV 95.0 95.6 95.8 95.8 96.3  PLT 154 174 199 204 241    Basic Metabolic Panel: Recent Labs  Lab 06/08/22 0140 06/09/22 0102 06/10/22 0112 06/11/22 0943 06/12/22 0157 06/13/22 0156  NA 135 135 133* 136 135 134*  K 4.0 4.3 4.7 5.4* 5.2* 5.1  CL 95* 98 98 101 96* 95*  CO2 24 27 25 25 27 24   GLUCOSE 111* 102* 101* 97 101* 98  BUN 45* 27* 45* 65* 31* 52*  CREATININE 5.54* 3.68* 5.30* 6.73* 3.86* 4.93*  CALCIUM 7.7* 7.7* 8.1* 8.3* 8.4* 8.4*  MG 1.8 1.8 1.8  --  1.6* 1.9  PHOS 3.2 2.3* 2.8 3.6 2.9 3.7    Recent Labs  Lab 06/09/22 0102 06/10/22 0112 06/11/22 0943 06/12/22 0157 06/13/22 0156  ALBUMIN 2.7* 2.7* 2.7* 2.8* 2.8*  No results found for this or any previous visit (from the past 240 hour(s)).   Antimicrobials: Anti-infectives (From admission, onward)    Start     Dose/Rate Route Frequency Ordered Stop   05/31/22 1115  ceFAZolin (ANCEF) IVPB 2g/100 mL premix        2 g 200 mL/hr over 30 Minutes Intravenous  Once 05/31/22 1108 05/31/22 1200   05/31/22 1112  ceFAZolin (ANCEF) 2-4 GM/100ML-% IVPB       Note to Pharmacy: Shanda Bumps M: cabinet override      05/31/22 1112 05/31/22 1206      Culture/Microbiology    Component Value Date/Time   SDES  11/29/2020 1705    URINE, CLEAN CATCH Performed at [facility], [address] [place], [place] [address]    [facility]  11/29/2020 1705    NONE Performed at [facility], [address], [place], [place] [address]    CULT MULTIPLE SPECIES PRESENT, SUGGEST RECOLLECTION (A) 11/29/2020 1705   REPTSTATUS 12/01/2020 FINAL 11/29/2020 1705  Radiology Studies: No results found.   LOS: 15 days   Lanae Boast, MD [facility]  06/14/2022, 10:45 AM

## 2022-06-15 DIAGNOSIS — E440 Moderate protein-calorie malnutrition: Secondary | ICD-10-CM | POA: Insufficient documentation

## 2022-06-15 DIAGNOSIS — N19 Unspecified kidney failure: Secondary | ICD-10-CM | POA: Diagnosis not present

## 2022-06-15 DIAGNOSIS — I4892 Unspecified atrial flutter: Secondary | ICD-10-CM

## 2022-06-15 LAB — RENAL FUNCTION PANEL
Albumin: 2.8 g/dL — ABNORMAL LOW (ref 3.5–5.0)
Anion gap: 11 (ref 5–15)
BUN: 55 mg/dL — ABNORMAL HIGH (ref 8–23)
CO2: 25 mmol/L (ref 22–32)
Calcium: 8.6 mg/dL — ABNORMAL LOW (ref 8.9–10.3)
Chloride: 99 mmol/L (ref 98–111)
Creatinine, Ser: 5.91 mg/dL — ABNORMAL HIGH (ref 0.61–1.24)
GFR, Estimated: 9 mL/min — ABNORMAL LOW (ref >60)
Glucose, Bld: 90 mg/dL (ref 70–99)
Phosphorus: 4.9 mg/dL — ABNORMAL HIGH (ref 2.5–4.6)
Potassium: 4.7 mmol/L (ref 3.5–5.1)
Sodium: 135 mmol/L (ref 135–145)

## 2022-06-15 LAB — CBC
HCT: 26 % — ABNORMAL LOW (ref 39.0–52.0)
Hemoglobin: 8 g/dL — ABNORMAL LOW (ref 13.0–17.0)
MCH: 29.4 pg (ref 26.0–34.0)
MCHC: 30.8 g/dL (ref 30.0–36.0)
MCV: 95.6 fL (ref 80.0–100.0)
Platelets: 231 10*3/uL (ref 150–400)
RBC: 2.72 MIL/uL — ABNORMAL LOW (ref 4.22–5.81)
RDW: 15 % (ref 11.5–15.5)
WBC: 6.5 10*3/uL (ref 4.0–10.5)
nRBC: 0 % (ref 0.0–0.2)

## 2022-06-15 MED ORDER — HEPARIN SODIUM (PORCINE) 1000 UNIT/ML IJ SOLN
INTRAMUSCULAR | Status: AC
  Administered 2022-06-15: 3200 [IU]
  Filled 2022-06-15: qty 4

## 2022-06-15 MED ORDER — DILTIAZEM HCL ER COATED BEADS 300 MG PO CP24: 300.0000 mg | ORAL_CAPSULE | Freq: Every day | ORAL | Status: DC

## 2022-06-15 MED ORDER — DILTIAZEM HCL ER COATED BEADS 180 MG PO CP24
300.0000 mg | ORAL_CAPSULE | Freq: Every day | ORAL | Status: DC
  Administered 2022-06-15 – 2022-06-20 (×6): 300 mg via ORAL
  Filled 2022-06-15 (×7): qty 1

## 2022-06-15 NOTE — Progress Notes (Signed)
Report given to dialysis RN, 1st shift dialysis today. Plan of care continues.

## 2022-06-15 NOTE — Progress Notes (Signed)
Received patient in bed to unit.  Alert and oriented.  Informed consent signed and in chart.   TX duration:3.5  Patient tolerated well.  Transported back to the room  Alert, without acute distress.  Hand-off given to patient's nurse.   Access used: left AVG Access issues: none  Total UF removed: 100 Medication(s) given: -100    06/15/22 1309  Vitals  Temp 98.3 F (36.8 C)  Temp Source Oral  BP (!) 143/88  MAP (mmHg) 103  BP Location Right Arm  BP Method Automatic  Patient Position (if appropriate) Lying  Pulse Rate (!) 131  Pulse Rate Source Monitor  ECG Heart Rate (!) 131  Resp 19  Oxygen Therapy  SpO2 99 %  O2 Device Room Air  During Treatment Monitoring  HD Safety Checks Performed Yes  Intra-Hemodialysis Comments Tx completed;Tolerated well  Dialysis Fluid Bolus Normal Saline  Bolus Amount (mL) 300 mL      Davie Sagona S [facility]

## 2022-06-15 NOTE — Procedures (Signed)
Patient seen and examined on Hemodialysis. The procedure was supervised and I have made appropriate changes. BP (!) 147/82   Pulse (!) 132   Temp 98.2 F (36.8 C) (Oral)   Resp (!) 21   Ht 5\' 9"  (1.753 m)   Wt 74.3 kg Comment: BED  SpO2 100%   BMI 24.19 kg/m   QB 300 mL/ min via AVG, UF goal 1L  Tolerating treatment without complaints at this time.   Bufford Buttner MD [facility] [phone] 10:58 AM

## 2022-06-15 NOTE — Plan of Care (Signed)
  Problem: Education: Goal: Knowledge of General Education information will improve Description: Including pain rating scale, medication(s)/side effects and non-pharmacologic comfort measures Outcome: Progressing   Problem: Health Behavior/Discharge Planning: Goal: Ability to manage health-related needs will improve Outcome: Progressing   Problem: Clinical Measurements: Goal: Ability to maintain clinical measurements within normal limits will improve Outcome: Progressing Goal: Will remain free from infection Outcome: Progressing Goal: Diagnostic test results will improve Outcome: Progressing Goal: Cardiovascular complication will be avoided Outcome: Progressing   Problem: Activity: Goal: Risk for activity intolerance will decrease Outcome: Progressing   Problem: Nutrition: Goal: Adequate nutrition will be maintained Outcome: Progressing   Problem: Elimination: Goal: Will not experience complications related to bowel motility Outcome: Progressing Goal: Will not experience complications related to urinary retention Outcome: Progressing   Problem: Pain Managment: Goal: General experience of comfort will improve Outcome: Progressing   Problem: Safety: Goal: Ability to remain free from injury will improve Outcome: Progressing

## 2022-06-15 NOTE — Progress Notes (Addendum)
Pt came back to rm 2 from HD. Re-initiated pt to tele. Obtained VS. Call bell within reach. Primary MD and cardiologist aware of pt's HR of 130s. Plan for cardioversion. Administered cardizem 30 mg PRN  Lawson Radar, RN

## 2022-06-15 NOTE — Progress Notes (Signed)
Inpatient Rehab Admissions Coordinator:   Note plans for TEE/DCCV Monday for rate control.  Will f/u afterwards.   Estill Dooms, PT, DPT Admissions Coordinator [phone] 06/15/22  1:27 PM

## 2022-06-15 NOTE — Progress Notes (Signed)
[facility] Progress Note   Assessment/ Plan:   ESRD: History of such and was off dialysis for some time. Looked into PD but not an option given adhesions. Now ESRD again and agreeing to dialysis -Continue with dialysis MWF -Outpatient dialysis arranged at [facility] but waiting for formal discharge plan given possibly going to CIR -[facility] in place with VVS.  Appreciate help -used AVG 5/15, 5/17.  Next HD will be Monday using AVG and if this is successful will pull TDC next week   Atrial fibrillation with RVR: Persistent tachycardia.  Management per primary team   Anemia of CKD: Persistently low hemoglobin.  Iron depleted and IV iron as ordered.  Increase ESA due on Friday.   Hypertension: Blood pressure acceptable on current medications.   Secondary hyperparathyroidism: With hypocalcemia and hyperphosphatemia.  On PhosLo.  Started calcitriol 0.5 mcg daily.  Ca and phos much improved. Decrease phosloContinue to monitor  Subjective:    Seen on dialysis. HR still in the 130s.  Using AVG without issue.     Objective:   BP (!) 147/82   Pulse (!) 132   Temp 98.2 F (36.8 C) (Oral)   Resp (!) 21   Ht 5\' 9"  (1.753 m)   Wt 74.3 kg Comment: BED  SpO2 100%   BMI 24.19 kg/m   Intake/Output Summary (Last 24 hours) at 06/15/2022 1056 Last data filed at 06/15/2022 0534 Gross per 24 hour  Intake 480 ml  Output 1527 ml  Net -1047 ml   Weight change: -3.1 kg  Physical Exam: [id number] appearing CVS: irregular Resp: clear Abd: soft Ext: no LE edema ACCESS: TDC, AVG  Imaging: No results found.  Labs: BMET Recent Labs  Lab 06/09/22 0102 06/10/22 0112 06/11/22 0943 06/12/22 0157 06/13/22 0156 06/15/22 0855  NA 135 133* 136 135 134* 135  K 4.3 4.7 5.4* 5.2* 5.1 4.7  CL 98 98 101 96* 95* 99  CO2 27 25 25 27 24 25   GLUCOSE 102* 101* 97 101* 98 90  BUN 27* 45* 65* 31* 52* 55*  CREATININE 3.68* 5.30* 6.73* 3.86* 4.93* 5.91*  CALCIUM 7.7* 8.1* 8.3* 8.4* 8.4* 8.6*  PHOS  2.3* 2.8 3.6 2.9 3.7 4.9*   CBC Recent Labs  Lab 06/12/22 0157 06/13/22 0156 06/14/22 0154 06/15/22 0202  WBC 7.5 6.5 6.6 6.5  HGB 7.8* 7.6* 8.1* 8.0*  HCT 25.2* 25.1* 26.2* 26.0*  MCV 95.8 95.8 96.3 95.6  PLT 199 204 241 231    Medications:     sodium chloride   Intravenous Once   apixaban  2.5 mg Oral BID   calcitRIOL  0.5 mcg Oral Daily   calcium acetate  667 mg Oral TID WC   Chlorhexidine Gluconate Cloth  6 each Topical Q0600   darbepoetin (ARANESP) injection - DIALYSIS  100 mcg Subcutaneous Q Fri-1800   diltiazem  240 mg Oral Daily   feeding supplement (NEPRO CARB STEADY)  237 mL Oral BID BM   loratadine  10 mg Oral Daily   Bufford Buttner MD 06/15/2022, 10:56 AM

## 2022-06-15 NOTE — Progress Notes (Signed)
PROGRESS NOTE Glenn Olson  GNF:[id number] DOB: 01/06/1941 DOA: 05/30/2022 PCP: Benita Stabile, MD  Brief Narrative/Hospital Course: 82 y.o. male with medical history significant for ESRD, neurogenic atonic bladder presented to [facility] with headache and chest pain for 1 week.  Patient was on hemodialysis but had stopped going for hemodialysis for 1 year now.  Has been having poor oral intake with nausea and vomiting.  In the ED, patient was mildly tachypneic and tachycardic with elevated blood pressure.  Chest x-ray with increased left lower lower lobe markings. CO2 less than 7.  BUN of 241.  Potassium 4.5.  Cr 13.8.Troponin 48 > 52.  Nephrology was consulted and decision was to proceed with hemodialysis.  Patient was then transferred to [facility].   Patient has had HD cath placed and started on hemodialysis.  Encephalopathy improving. Hospital course complicated by A-fib with RVR.  He was started on p.o. Cardizem and IV heparin.  TTE without significant finding.  RVR improved. Transitioned to p.o. Cardizem CD, Eliquis and signed off by cardiology. Patient was treated for anemia of CKD, hypertension, secondary hyperparathyroidism with hypocalcemia and hyperphosphatemia. Remains weak and deconditioned with debility PT OT recommending CIR and awaiting for placement    Subjective: Seen and examined in HD Is doing well he has no complaints Heart rate has been poorly controlled 130s with A-fib RVR  Assessment and Plan: Principal Problem:   Uremia Active Problems:   Metabolic acidosis   Chest pain   Atonic neurogenic bladder   ESRD on hemodialysis (HCC)   Chronic indwelling Foley catheter   Hypokalemia   Hypomagnesemia   Typical atrial flutter (HCC)   Persistent atrial fibrillation (HCC)   Malnutrition of moderate degree  Acute uremic encephalopathy due to ESRD after being off HD for a year: mentation stable tolerating HD continue HD per schedule awaiting for  CIR  ESRD:Continue HD per schedule nephrology following closely. 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 HR still poorly controlled after increasing Cardizem to 240 mg, cardiology requested to follow-up now.  On as needed Cardizem Continue Eliquis.     Hypokalemia/hypocalcemia/hypomagnesemia: labs stable.   Hyperkalemia-resolved Recent Labs  Lab 06/09/22 0102 06/10/22 0112 06/11/22 0943 06/12/22 0157 06/13/22 0156 06/15/22 0855  K 4.3 4.7 5.4* 5.2* 5.1 4.7  CALCIUM 7.7* 8.1* 8.3* 8.4* 8.4* 8.6*  MG 1.8 1.8  --  1.6* 1.9  --   PHOS 2.3* 2.8 3.6 2.9 3.7 4.9*   Atonic neurogenic bladder: cont SP catheter and catheter care Anemia of chronic kidney disease hb is stable, s/p 1 unit prbc 06/01/22.  Monitor Recent Labs  Lab 06/11/22 0123 06/12/22 0157 06/13/22 0156 06/14/22 0154 06/15/22 0202  HGB 7.4* 7.8* 7.6* 8.1* 8.0*  HCT 23.9* 25.2* 25.1* 26.2* 26.0*   Deconditioning, debility: Continue with PT OT awaiting for CIR.     Thrombocytopenia: Resolved.  DVT prophylaxis: apixaban (ELIQUIS) tablet 2.5 mg Start: 06/09/22 1700 SCDs Start: 05/30/22 1926 Code Status:   Code Status: DNR Family Communication: plan of care discussed with patient at bedside. Patient status is:  inpatient because of awaiting CIR once heart rate is optimized Level of care: Telemetry Cardiac   Dispo: The patient is from: home            Anticipated disposition: CIR pending  once heart rate is optimized Objective: Vitals last 24 hrs:  Vitals:   06/15/22 1000 06/15/22 1030 06/15/22 1100 06/15/22 1130  BP: 133/89 (!) 147/82 125/86 (!) 127/91  Pulse: (!) 132 (!) 132 (!) 132 (!) 132  Resp: 18 (!) 21 20 16   Temp:      TempSrc:      SpO2: 100% 100% 100% 100%  Weight:      Height:       Weight change: -3.1 kg  Physical Examination: General exam: alert  awake, oriented, older than stated age HEENT:Oral mucosa moist, Ear/Nose WNL grossly Respiratory system: Bilaterally clear BS, chest TDC present  Cardiovascular system: S1 & S2 +, No JVD. Gastrointestinal system: Abdomen soft,NT,ND, BS+ Nervous System: Alert, awake, moving extremities, he follows commands. Extremities: LE edema neg,distal peripheral pulses palpable.  Skin: No rashes,no icterus. MSK: Normal muscle bulk,tone, power  Medications reviewed:  Scheduled Meds:  sodium chloride   Intravenous Once   apixaban  2.5 mg Oral BID   calcitRIOL  0.5 mcg Oral Daily   calcium acetate  667 mg Oral TID WC   Chlorhexidine Gluconate Cloth  6 each Topical Q0600   darbepoetin (ARANESP) injection - DIALYSIS  100 mcg Subcutaneous Q Fri-1800   [START ON 06/16/2022] diltiazem  300 mg Oral Daily   feeding supplement (NEPRO CARB STEADY)  237 mL Oral BID BM   loratadine  10 mg Oral Daily   Continuous Infusions:    Diet Order             Diet regular Room service appropriate? Yes with Assist; Fluid consistency: Thin; Fluid restriction: 1200 mL Fluid  Diet effective now                    Nutrition Problem: Moderate Malnutrition Etiology: chronic illness (ESRD) Signs/Symptoms: moderate fat depletion, moderate muscle depletion, severe muscle depletion Interventions: Magic cup, MVI, Carnation Instant Breakfast   Intake/Output Summary (Last 24 hours) at 06/15/2022 1141 Last data filed at 06/15/2022 0534 Gross per 24 hour  Intake 480 ml  Output 1527 ml  Net -1047 ml    Net IO Since Admission: -8,944 mL [06/15/22 1141]  Wt Readings from Last 3 Encounters:  06/15/22 74.3 kg  03/17/21 80.3 kg  02/22/21 77.6 kg     Unresulted Labs (From admission, onward)    None     Data Reviewed: I have personally reviewed following labs and imaging studies CBC: Recent Labs  Lab 06/11/22 0123 06/12/22 0157 06/13/22 0156 06/14/22 0154 06/15/22 0202  WBC 7.7 7.5 6.5 6.6 6.5  HGB 7.4* 7.8*  7.6* 8.1* 8.0*  HCT 23.9* 25.2* 25.1* 26.2* 26.0*  MCV 95.6 95.8 95.8 96.3 95.6  PLT 174 199 204 241 231    Basic Metabolic Panel: Recent Labs  Lab 06/09/22 0102 06/10/22 0112 06/11/22 0943 06/12/22 0157 06/13/22 0156 06/15/22 0855  NA 135 133* 136 135 134* 135  K 4.3 4.7 5.4* 5.2* 5.1 4.7  CL 98 98 101 96* 95* 99  CO2 27 25 25 27 24 25   GLUCOSE 102* 101* 97 101* 98 90  BUN 27* 45* 65* 31* 52* 55*  CREATININE 3.68* 5.30* 6.73* 3.86* 4.93* 5.91*  CALCIUM 7.7* 8.1* 8.3* 8.4* 8.4* 8.6*  MG 1.8 1.8  --  1.6* 1.9  --   PHOS 2.3* 2.8 3.6 2.9 3.7 4.9*    Recent Labs  Lab 06/10/22 0112 06/11/22 0943 06/12/22 0157 06/13/22 0156 06/15/22 0855  ALBUMIN 2.7* 2.7* 2.8* 2.8* 2.8*    No results found for this or any previous  visit (from the past 240 hour(s)).   Antimicrobials: Anti-infectives (From admission, onward)    Start     Dose/Rate Route Frequency Ordered Stop   05/31/22 1115  ceFAZolin (ANCEF) IVPB 2g/100 mL premix        2 g 200 mL/hr over 30 Minutes Intravenous  Once 05/31/22 1108 05/31/22 1200   05/31/22 1112  ceFAZolin (ANCEF) 2-4 GM/100ML-% IVPB       Note to Pharmacy: Shanda Bumps M: cabinet override      05/31/22 1112 05/31/22 1206      Culture/Microbiology    Component Value Date/Time   SDES  11/29/2020 1705    URINE, CLEAN CATCH Performed at [facility], [address], [place], [place] [address]    [facility]  11/29/2020 1705    NONE Performed at [facility], [address] [place] [address], [place], [place] [address]    CULT MULTIPLE SPECIES PRESENT, SUGGEST RECOLLECTION (A) 11/29/2020 1705   REPTSTATUS 12/01/2020 FINAL 11/29/2020 1705  Radiology Studies: No results found.   LOS: 16 days   Lanae Boast, MD [facility]  06/15/2022, 11:41 AM

## 2022-06-15 NOTE — Progress Notes (Signed)
OT Cancellation Note  Patient Details Name: Jacorri Crombie MRN: [id number] DOB: 09/29/1940   Cancelled Treatment:    Reason Eval/Treat Not Completed: Patient at procedure or test/ unavailable.  Remains at HD.  Layken Doenges D Chino Sardo 06/15/2022, 1:52 PM 06/15/2022  RP, OTR/L  Acute Rehabilitation Services  Office:  [phone]

## 2022-06-15 NOTE — Progress Notes (Addendum)
Rounding Note    Patient Name: Glenn Olson Date of Encounter: 06/15/2022  [facility] Cardiologist: None   Subjective   Patient reports feeling "bad today." Denies chest pain, palpitations, shortness of breath, cough. Upset because he missed breakfast today   Inpatient Medications    Scheduled Meds:  sodium chloride   Intravenous Once   apixaban  2.5 mg Oral BID   calcitRIOL  0.5 mcg Oral Daily   calcium acetate  667 mg Oral TID WC   Chlorhexidine Gluconate Cloth  6 each Topical Q0600   darbepoetin (ARANESP) injection - DIALYSIS  100 mcg Subcutaneous Q Fri-1800   diltiazem  240 mg Oral Daily   feeding supplement (NEPRO CARB STEADY)  237 mL Oral BID BM   loratadine  10 mg Oral Daily   Continuous Infusions:  PRN Meds: acetaminophen **OR** acetaminophen, diltiazem, guaiFENesin-dextromethorphan, HYDROmorphone (DILAUDID) injection, labetalol, melatonin, ondansetron (ZOFRAN) IV, mouth rinse, polyethylene glycol   Vital Signs    Vitals:   06/15/22 0902 06/15/22 0930 06/15/22 1000 06/15/22 1030  BP: (!) 122/91 (!) 144/86 133/89 (!) 147/82  Pulse: (!) 129 (!) 128 (!) 132 (!) 132  Resp: 17 (!) 29 18 (!) 21  Temp:      TempSrc:      SpO2: 99% 97% 100% 100%  Weight:      Height:        Intake/Output Summary (Last 24 hours) at 06/15/2022 1036 Last data filed at 06/15/2022 0534 Gross per 24 hour  Intake 480 ml  Output 1527 ml  Net -1047 ml      06/15/2022    8:49 AM 06/15/2022    5:34 AM 06/14/2022    5:31 AM  Last 3 Weights  Weight (lbs) 163 lb 12.8 oz 159 lb 13.3 oz 157 lb 13.6 oz  Weight (kg) 74.3 kg 72.5 kg 71.6 kg      Telemetry    Afib, HR in the 130s during HD  - Personally Reviewed  ECG    No new tracings since 5/7 - Personally Reviewed  Physical Exam   GEN: No acute distress.  Sitting in the bed with head elevated, undergoing HD  Neck: No JVD Cardiac: Irregular rate and rhythm, tachycardic  Respiratory: Clear to auscultation  bilaterally. Normal work of breathing  GI: Soft, nontender, non-distended  MS: No edema in BLE; No deformity. Neuro:  Nonfocal  Psych: Normal affect   Labs    High Sensitivity Troponin:   Recent Labs  Lab 05/30/22 1445 05/30/22 1640  TROPONINIHS 48* 52*     Chemistry Recent Labs  Lab 06/10/22 0112 06/11/22 0943 06/12/22 0157 06/13/22 0156 06/15/22 0855  NA 133*   < > 135 134* 135  K 4.7   < > 5.2* 5.1 4.7  CL 98   < > 96* 95* 99  CO2 25   < > 27 24 25   GLUCOSE 101*   < > 101* 98 90  BUN 45*   < > 31* 52* 55*  CREATININE 5.30*   < > 3.86* 4.93* 5.91*  CALCIUM 8.1*   < > 8.4* 8.4* 8.6*  MG 1.8  --  1.6* 1.9  --   ALBUMIN 2.7*   < > 2.8* 2.8* 2.8*  GFRNONAA 10*   < > 15* 11* 9*  ANIONGAP 10   < > 12 15 11    < > = values in this interval not displayed.    Lipids No results for input(s): "CHOL", "TRIG", "HDL", "LABVLDL", "  LDLCALC", "CHOLHDL" in the last 168 hours.  Hematology Recent Labs  Lab 06/13/22 0156 06/14/22 0154 06/15/22 0202  WBC 6.5 6.6 6.5  RBC 2.62* 2.72* 2.72*  HGB 7.6* 8.1* 8.0*  HCT 25.1* 26.2* 26.0*  MCV 95.8 96.3 95.6  MCH 29.0 29.8 29.4  MCHC 30.3 30.9 30.8  RDW 14.8 15.1 15.0  PLT 204 241 231   Thyroid No results for input(s): "TSH", "FREET4" in the last 168 hours.  BNPNo results for input(s): "BNP", "PROBNP" in the last 168 hours.  DDimer No results for input(s): "DDIMER" in the last 168 hours.   Radiology    No results found.  Cardiac Studies   Echocardiogram 06/05/22 1. Left ventricular ejection fraction, by estimation, is 55 to 60%. The  left ventricle has normal function. The left ventricle has no regional  wall motion abnormalities. There is mild concentric left ventricular  hypertrophy. Left ventricular diastolic  function could not be evaluated.   2. Right ventricular systolic function is normal. The right ventricular  size is normal. There is mildly elevated pulmonary artery systolic  pressure. The estimated right  ventricular systolic pressure is 44.2 mmHg.   3. Left atrial size was moderately dilated.   4. Right atrial size was mildly dilated.   5. The mitral valve is grossly normal. Trivial mitral valve  regurgitation. No evidence of mitral stenosis.   6. The aortic valve is tricuspid. Aortic valve regurgitation is not  visualized. No aortic stenosis is present.   7. The inferior vena cava is dilated in size with <50% respiratory  variability, suggesting right atrial pressure of 15 mmHg.   Patient Profile     82 y.o. male with a past medical history of end-stage renal disease (had not attended dialysis in over a year), neurogenic atonic bladder. Patient is currently admitted with acute uremic encephalopathy.  Cardiology was consulted on 5/7 for new onset atrial fibs/flutter with RVR.  He was treated with diltiazem 240 mg daily and Eliquis 5 mg twice daily.  On 5/12, rates were well-controlled and cardiology signed off.  Cardiology is being reengaged on 5/17 because heart rates again became elevated.  Assessment & Plan    Atrial fibrillation - As above, cardiology was consulted on 5/7 for new onset atrial fibrillation/flutter with RVR. - Patient has been on diltiazem 240 mg daily and Eliquis 2.5 mg twice daily (dose reduced for age, renal function)  - Echocardiogram 06/05/2022 showed EF 55-60%, no regional wall motion abnormalities, mild LVH, normal RV systolic function, moderately dilated left atrium -On 5/17, cardiology is being reengaged because of A-fib with RVR. Per chart review, patient was due for his Cardizem CD 240 mg at 8 AM, but it has not yet been given - Evaluated patient in HD- HR is sustaining in the 130s. BP stable  - Patient has been receiving an additional 30 mg cardizem multiple times per day for the past few days due to elevated HR  - Increase cardizem CD to 300 mg daily. BP has been a bit elevated today, and he has not been hypotensive for the past few days. Suspect his BP will  tolerate dose increase well  -Given difficulty with rate control, will plan for TEE/DCCV on 5/20.     For questions or updates, please contact [facility] Please consult [email or web address] for contact info under        Signed, Jonita Albee, PA-C  06/15/2022, 10:36 AM    Patient seen and examined.  Agree with above  documentation.  On exam, patient is alert and oriented, regular, tachycardic, no murmurs, lungs CTAB, no LE edema.  Will increase diltiazem, but given currently in 2:1 AFL, may be difficult to rate control. Given difficulty with rate control, will plan for TEE/DCCV on 5/20.  After careful review of history and examination, the risks and benefits of transesophageal echocardiogram have been explained including risks of esophageal damage, perforation (1:10,000 risk), bleeding, pharyngeal hematoma as well as other potential complications associated with conscious sedation including aspiration, arrhythmia, respiratory failure and death. Alternatives to treatment were discussed, questions were answered. Patient is willing to proceed.   Little Ishikawa, MD

## 2022-06-16 DIAGNOSIS — N19 Unspecified kidney failure: Secondary | ICD-10-CM | POA: Diagnosis not present

## 2022-06-16 NOTE — Progress Notes (Signed)
[facility] Progress Note   Assessment/ Plan:   ESRD: History of such and was off dialysis for some time. Looked into PD but not an option given adhesions. Now ESRD again and agreeing to dialysis -Continue with dialysis MWF -Outpatient dialysis arranged at [facility] but waiting for formal discharge plan given possibly going to CIR -[facility] in place with VVS.  Appreciate help -used AVG 5/15, 5/17.  Next HD will be Monday using AVG and if this is successful will pull TDC next week.  Using 16 g needles   Atrial fibrillation with RVR: Persistent tachycardia.  Management per primary team   Anemia of CKD: Persistently low hemoglobin.  Iron depleted and IV iron as ordered.  Increase ESA due on Friday.   Hypertension: Blood pressure acceptable on current medications.   Secondary hyperparathyroidism: With hypocalcemia and hyperphosphatemia.  On PhosLo.  Started calcitriol 0.5 mcg daily.  Ca and phos much improved. Decrease phosloContinue to monitor  Subjective:    Sleeping, arousable, no complaints.  Did OK in dialysis yesterday.     Objective:   BP 130/72 (BP Location: Right Arm)   Pulse 87   Temp 97.6 F (36.4 C) (Oral)   Resp 20   Ht 5\' 9"  (1.753 m)   Wt 74.5 kg Comment: bed  SpO2 95%   BMI 24.25 kg/m   Intake/Output Summary (Last 24 hours) at 06/16/2022 0838 Last data filed at 06/16/2022 1610 Gross per 24 hour  Intake 500 ml  Output 1250 ml  Net -750 ml   Weight change: 1.8 kg  Physical Exam: RUE:AVWUJ appearing CVS: irregular Resp: clear Abd: soft Ext: no LE edema ACCESS: TDC, AVG  Imaging: No results found.  Labs: BMET Recent Labs  Lab 06/10/22 0112 06/11/22 0943 06/12/22 0157 06/13/22 0156 06/15/22 0855  NA 133* 136 135 134* 135  K 4.7 5.4* 5.2* 5.1 4.7  CL 98 101 96* 95* 99  CO2 25 25 27 24 25   GLUCOSE 101* 97 101* 98 90  BUN 45* 65* 31* 52* 55*  CREATININE 5.30* 6.73* 3.86* 4.93* 5.91*  CALCIUM 8.1* 8.3* 8.4* 8.4* 8.6*  PHOS 2.8 3.6 2.9 3.7  4.9*   CBC Recent Labs  Lab 06/12/22 0157 06/13/22 0156 06/14/22 0154 06/15/22 0202  WBC 7.5 6.5 6.6 6.5  HGB 7.8* 7.6* 8.1* 8.0*  HCT 25.2* 25.1* 26.2* 26.0*  MCV 95.8 95.8 96.3 95.6  PLT 199 204 241 231    Medications:     sodium chloride   Intravenous Once   apixaban  2.5 mg Oral BID   calcitRIOL  0.5 mcg Oral Daily   calcium acetate  667 mg Oral TID WC   Chlorhexidine Gluconate Cloth  6 each Topical Q0600   darbepoetin (ARANESP) injection - DIALYSIS  100 mcg Subcutaneous Q Fri-1800   diltiazem  300 mg Oral Daily   feeding supplement (NEPRO CARB STEADY)  237 mL Oral BID BM   loratadine  10 mg Oral Daily   Bufford Buttner MD 06/16/2022, 8:38 AM

## 2022-06-16 NOTE — Plan of Care (Signed)

## 2022-06-16 NOTE — Progress Notes (Signed)
PROGRESS NOTE Glenn Olson  ZOX:[id number] DOB: August 08, 1940 DOA: 05/30/2022 PCP: Benita Stabile, MD  Brief Narrative/Hospital Course: 82 y.o. male with medical history significant for ESRD, neurogenic atonic bladder presented to [facility] with headache and chest pain for 1 week.  Patient was on hemodialysis but had stopped going for hemodialysis for 1 year now.  Has been having poor oral intake with nausea and vomiting.  In the ED, patient was mildly tachypneic and tachycardic with elevated blood pressure.  Chest x-ray with increased left lower lower lobe markings. CO2 less than 7.  BUN of 241.  Potassium 4.5.  Cr 13.8.Troponin 48 > 52.  Nephrology was consulted and decision was to proceed with hemodialysis.  Patient was then transferred to [facility].   Patient has had HD cath placed and started on hemodialysis.  Encephalopathy improving. Hospital course complicated by A-fib with RVR.  He was started on p.o. Cardizem and IV heparin.  TTE without significant finding.  RVR improved. Transitioned to p.o. Cardizem CD, Eliquis and signed off by cardiology. Patient was treated for anemia of CKD, hypertension, secondary hyperparathyroidism with hypocalcemia and hyperphosphatemia.Remains weak and deconditioned with debility PT OT recommending CIR and awaiting for placement     Subjective: Patient seen and examined this morning. Resting comfortably no chest pain or dyspnea shortness of breath.  Assessment and Plan: Principal Problem:   Uremia Active Problems:   Metabolic acidosis   Chest pain   Atonic neurogenic bladder   ESRD on hemodialysis (HCC)   Chronic indwelling Foley catheter   Hypokalemia   Hypomagnesemia   Atrial flutter (HCC)   Persistent atrial fibrillation (HCC)   Malnutrition of moderate degree  New onset a flutter/A-fib with RVR: TTE without significant finding. HR was somewhat better after being on Cardizem and cardiology had signed off but remained poorly  controlled cardiology back on board 5/17.  Diltiazem increased to 300 mg.  Given difficulty in rate control Cardio planning for TEE/DCCV on 5/20 Continue on Eliquis,monitor in tele.  Acute uremic encephalopathy 2/2 ESRD after being off HD for a year.Mental status back to baseline continue HD continue PT OT and rehab placement   ESRD:Continue HD per schedule nephrology following closely. Chest [facility] in place, but using his left AVG for HD> if able to ease AVG again on 5/20 planning to pull out TDC. Secondary hyperparathyroidism with hypocalcium and hyperphosphatemia> continue PhosLo,>started on  Calcitrol 0.5 mcg daily> calcium and phos much better,decreasing PhosLo plan per nephrology. Hypokalemia/hypocalcemia/hypomagnesemia: labs stable.   Hyperkalemia-resolved Recent Labs  Lab 06/10/22 0112 06/11/22 0943 06/12/22 0157 06/13/22 0156 06/15/22 0855  K 4.7 5.4* 5.2* 5.1 4.7  CALCIUM 8.1* 8.3* 8.4* 8.4* 8.6*  MG 1.8  --  1.6* 1.9  --   PHOS 2.8 3.6 2.9 3.7 4.9*   Atonic neurogenic bladder: cont SP catheter and catheter care Anemia of chronic kidney disease hb stable. s/p 1 unit prbc 06/01/22.  Monitor as below. Recent Labs  Lab 06/11/22 0123 06/12/22 0157 06/13/22 0156 06/14/22 0154 06/15/22 0202  HGB 7.4* 7.8* 7.6* 8.1* 8.0*  HCT 23.9* 25.2* 25.1* 26.2* 26.0*   Deconditioning, debility: Continue with PT OT awaiting for CIR once a fib better.     Thrombocytopenia: Resolved.  DVT prophylaxis: apixaban (ELIQUIS) tablet 2.5 mg Start: 06/09/22 1700 SCDs Start: 05/30/22 1926 Code Status:   Code Status: DNR Family Communication: plan of care discussed with patient at bedside. Patient status is:  inpatient because of awaiting CIR once heart rate is  optimized Level of care: Telemetry Cardiac   Dispo: The patient is from: home            Anticipated disposition: CIR pending improvement in heart rate   Objective: Vitals last 24 hrs: Vitals:   06/15/22 1957 06/15/22 2355 06/16/22 0344  06/16/22 0743  BP: 133/78 119/63 119/88 130/72  Pulse: (!) 130 94 98 87  Resp: 20 20 18 20   Temp: 98.2 F (36.8 C) 98.2 F (36.8 C) 98.1 F (36.7 C) 97.6 F (36.4 C)  TempSrc: Oral Oral Oral Oral  SpO2: 97% 100% 99% 95%  Weight:      Height:       Weight change: 1.8 kg  Physical Examination: General exam: AAox3, weak,older appearing HEENT:Oral mucosa moist, Ear/Nose WNL grossly, dentition normal. Respiratory system: bilaterally clear BS, chest TDC+  Cardiovascular system: S1 & S2 +, regular rate, JVD neg. Gastrointestinal system: Abdomen soft,NT,ND,BS+ Nervous System:Alert, awake, moving extremities and grossly nonfocal Extremities: LE ankle edema neg, lower extremities warm Skin: No rashes,no icterus. MSK: Normal muscle bulk,tone, power   Medications reviewed:  Scheduled Meds:  sodium chloride   Intravenous Once   apixaban  2.5 mg Oral BID   calcitRIOL  0.5 mcg Oral Daily   calcium acetate  667 mg Oral TID WC   Chlorhexidine Gluconate Cloth  6 each Topical Q0600   darbepoetin (ARANESP) injection - DIALYSIS  100 mcg Subcutaneous Q Fri-1800   diltiazem  300 mg Oral Daily   feeding supplement (NEPRO CARB STEADY)  237 mL Oral BID BM   loratadine  10 mg Oral Daily   Continuous Infusions:    Diet Order             Diet renal with fluid restriction Fluid restriction: 1200 mL Fluid; Room service appropriate? Yes; Fluid consistency: Thin  Diet effective now                    Nutrition Problem: Moderate Malnutrition Etiology: chronic illness (ESRD) Signs/Symptoms: moderate fat depletion, moderate muscle depletion, severe muscle depletion Interventions: Magic cup, MVI, Carnation Instant Breakfast   Intake/Output Summary (Last 24 hours) at 06/16/2022 0840 Last data filed at 06/16/2022 1308 Gross per 24 hour  Intake 500 ml  Output 1250 ml  Net -750 ml    Net IO Since Admission: -9,694 mL [06/16/22 0840]  Wt Readings from Last 3 Encounters:  06/15/22 74.5 kg   03/17/21 80.3 kg  02/22/21 77.6 kg     Unresulted Labs (From admission, onward)     Start     Ordered   Signed and Held  Protime-INR  ONCE - STAT,   R       Question:  Specimen collection method  Answer:  Lab=Lab collect   Signed and Held          Data Reviewed: I have personally reviewed following labs and imaging studies CBC: Recent Labs  Lab 06/11/22 0123 06/12/22 0157 06/13/22 0156 06/14/22 0154 06/15/22 0202  WBC 7.7 7.5 6.5 6.6 6.5  HGB 7.4* 7.8* 7.6* 8.1* 8.0*  HCT 23.9* 25.2* 25.1* 26.2* 26.0*  MCV 95.6 95.8 95.8 96.3 95.6  PLT 174 199 204 241 231    Basic Metabolic Panel: Recent Labs  Lab 06/10/22 0112 06/11/22 0943 06/12/22 0157 06/13/22 0156 06/15/22 0855  NA 133* 136 135 134* 135  K 4.7 5.4* 5.2* 5.1 4.7  CL 98 101 96* 95* 99  CO2 25 25 27 24 25   GLUCOSE 101* 97 101*  98 90  BUN 45* 65* 31* 52* 55*  CREATININE 5.30* 6.73* 3.86* 4.93* 5.91*  CALCIUM 8.1* 8.3* 8.4* 8.4* 8.6*  MG 1.8  --  1.6* 1.9  --   PHOS 2.8 3.6 2.9 3.7 4.9*    Recent Labs  Lab 06/10/22 0112 06/11/22 0943 06/12/22 0157 06/13/22 0156 06/15/22 0855  ALBUMIN 2.7* 2.7* 2.8* 2.8* 2.8*    No results found for this or any previous visit (from the past 240 hour(s)).   Antimicrobials: Anti-infectives (From admission, onward)    Start     Dose/Rate Route Frequency Ordered Stop   05/31/22 1115  ceFAZolin (ANCEF) IVPB 2g/100 mL premix        2 g 200 mL/hr over 30 Minutes Intravenous  Once 05/31/22 1108 05/31/22 1200   05/31/22 1112  ceFAZolin (ANCEF) 2-4 GM/100ML-% IVPB       Note to Pharmacy: Shanda Bumps M: cabinet override      05/31/22 1112 05/31/22 1206      Culture/Microbiology    Component Value Date/Time   SDES  11/29/2020 1705    URINE, CLEAN CATCH Performed at [facility], [address] [place], [place] [address]    [facility]  11/29/2020 1705    NONE Performed at [facility], [address] [place], [place] [address]    CULT MULTIPLE SPECIES  PRESENT, SUGGEST RECOLLECTION (A) 11/29/2020 1705   REPTSTATUS 12/01/2020 FINAL 11/29/2020 1705  Radiology Studies: No results found.   LOS: 17 days   Lanae Boast, MD [facility]  06/16/2022, 8:40 AM

## 2022-06-16 NOTE — Progress Notes (Signed)
Rounding Note    Patient Name: Glenn Olson Date of Encounter: 06/16/2022  [facility] Cardiologist: None   Subjective   BP 130/72.  Denies any chest pain or dyspnea  Inpatient Medications    Scheduled Meds:  sodium chloride   Intravenous Once   apixaban  2.5 mg Oral BID   calcitRIOL  0.5 mcg Oral Daily   calcium acetate  667 mg Oral TID WC   Chlorhexidine Gluconate Cloth  6 each Topical Q0600   darbepoetin (ARANESP) injection - DIALYSIS  100 mcg Subcutaneous Q Fri-1800   diltiazem  300 mg Oral Daily   feeding supplement (NEPRO CARB STEADY)  237 mL Oral BID BM   loratadine  10 mg Oral Daily   Continuous Infusions:  PRN Meds: acetaminophen **OR** acetaminophen, diltiazem, guaiFENesin-dextromethorphan, HYDROmorphone (DILAUDID) injection, labetalol, melatonin, ondansetron (ZOFRAN) IV, mouth rinse, polyethylene glycol   Vital Signs    Vitals:   06/15/22 1957 06/15/22 2355 06/16/22 0344 06/16/22 0743  BP: 133/78 119/63 119/88 130/72  Pulse: (!) 130 94 98 87  Resp: 20 20 18 20   Temp: 98.2 F (36.8 C) 98.2 F (36.8 C) 98.1 F (36.7 C) 97.6 F (36.4 C)  TempSrc: Oral Oral Oral Oral  SpO2: 97% 100% 99% 95%  Weight:      Height:        Intake/Output Summary (Last 24 hours) at 06/16/2022 0851 Last data filed at 06/16/2022 4098 Gross per 24 hour  Intake 500 ml  Output 1250 ml  Net -750 ml       06/15/2022    1:11 PM 06/15/2022    8:49 AM 06/15/2022    5:34 AM  Last 3 Weights  Weight (lbs) 164 lb 3.9 oz 163 lb 12.8 oz 159 lb 13.3 oz  Weight (kg) 74.5 kg 74.3 kg 72.5 kg      Telemetry    AFL with variable conduction, currently in 80-90s  - Personally Reviewed  ECG    No new tracings since 5/7 - Personally Reviewed  Physical Exam   GEN: No acute distress.  Sitting in the bed with head elevated, undergoing HD  Neck: No JVD Cardiac: Irregular rhythm, normal rate Respiratory: Clear to auscultation bilaterally. Normal work of breathing  GI:  Soft, nontender, non-distended  MS: No edema in BLE; No deformity. Neuro:  Nonfocal  Psych: Normal affect   Labs    High Sensitivity Troponin:   Recent Labs  Lab 05/30/22 1445 05/30/22 1640  TROPONINIHS 48* 52*      Chemistry Recent Labs  Lab 06/10/22 0112 06/11/22 0943 06/12/22 0157 06/13/22 0156 06/15/22 0855  NA 133*   < > 135 134* 135  K 4.7   < > 5.2* 5.1 4.7  CL 98   < > 96* 95* 99  CO2 25   < > 27 24 25   GLUCOSE 101*   < > 101* 98 90  BUN 45*   < > 31* 52* 55*  CREATININE 5.30*   < > 3.86* 4.93* 5.91*  CALCIUM 8.1*   < > 8.4* 8.4* 8.6*  MG 1.8  --  1.6* 1.9  --   ALBUMIN 2.7*   < > 2.8* 2.8* 2.8*  GFRNONAA 10*   < > 15* 11* 9*  ANIONGAP 10   < > 12 15 11    < > = values in this interval not displayed.     Lipids No results for input(s): "CHOL", "TRIG", "HDL", "LABVLDL", "LDLCALC", "CHOLHDL" in the last 168  hours.  Hematology Recent Labs  Lab 06/13/22 0156 06/14/22 0154 06/15/22 0202  WBC 6.5 6.6 6.5  RBC 2.62* 2.72* 2.72*  HGB 7.6* 8.1* 8.0*  HCT 25.1* 26.2* 26.0*  MCV 95.8 96.3 95.6  MCH 29.0 29.8 29.4  MCHC 30.3 30.9 30.8  RDW 14.8 15.1 15.0  PLT 204 241 231    Thyroid No results for input(s): "TSH", "FREET4" in the last 168 hours.  BNPNo results for input(s): "BNP", "PROBNP" in the last 168 hours.  DDimer No results for input(s): "DDIMER" in the last 168 hours.   Radiology    No results found.  Cardiac Studies   Echocardiogram 06/05/22 1. Left ventricular ejection fraction, by estimation, is 55 to 60%. The  left ventricle has normal function. The left ventricle has no regional  wall motion abnormalities. There is mild concentric left ventricular  hypertrophy. Left ventricular diastolic  function could not be evaluated.   2. Right ventricular systolic function is normal. The right ventricular  size is normal. There is mildly elevated pulmonary artery systolic  pressure. The estimated right ventricular systolic pressure is 44.2 mmHg.    3. Left atrial size was moderately dilated.   4. Right atrial size was mildly dilated.   5. The mitral valve is grossly normal. Trivial mitral valve  regurgitation. No evidence of mitral stenosis.   6. The aortic valve is tricuspid. Aortic valve regurgitation is not  visualized. No aortic stenosis is present.   7. The inferior vena cava is dilated in size with <50% respiratory  variability, suggesting right atrial pressure of 15 mmHg.   Patient Profile     82 y.o. male with a past medical history of end-stage renal disease (had not attended dialysis in over a year), neurogenic atonic bladder. Patient is currently admitted with acute uremic encephalopathy.  Cardiology was consulted on 5/7 for new onset atrial fibs/flutter with RVR.  He was treated with diltiazem 240 mg daily and Eliquis 5 mg twice daily.  On 5/12, rates were well-controlled and cardiology signed off.  Cardiology is being reengaged on 5/17 because heart rates again became elevated.  Assessment & Plan    Atrial fibrillation/flutter - As above, cardiology was consulted on 5/7 for new onset atrial fibrillation/flutter with RVR. - Patient has been on Eliquis 2.5 mg twice daily (dose reduced for age, renal function)  - Echocardiogram 06/05/2022 showed EF 55-60%, no regional wall motion abnormalities, mild LVH, normal RV systolic function, moderately dilated left atrium -On 5/17, cardiology is being reengaged because of A-fib with RVR. Cardizem increased to 300 mg daily, rates appear better controlled -Given difficulty with rate control and considering new onset Afib, will plan for TEE/DCCV on 5/20.  After careful review of history and examination, the risks and benefits of transesophageal echocardiogram have been explained including risks of esophageal damage, perforation (1:10,000 risk), bleeding, pharyngeal hematoma as well as other potential complications associated with conscious sedation including aspiration, arrhythmia,  respiratory failure and death. Alternatives to treatment were discussed, questions were answered. Patient is willing to proceed.   AMS: Presented with uremic encephalopathy due to ESRD and being off HD for a year.  Improved with restarting dialysis  ESRD: On HD   For questions or updates, please contact [facility] Please consult [email or web address] for contact info under        Signed, Little Ishikawa, MD  06/16/2022, 8:51 AM

## 2022-06-17 DIAGNOSIS — N19 Unspecified kidney failure: Secondary | ICD-10-CM | POA: Diagnosis not present

## 2022-06-17 NOTE — Progress Notes (Signed)
[facility] Progress Note   Assessment/ Plan:   ESRD: History of such and was off dialysis for some time. Looked into PD but not an option given adhesions. Now ESRD again and agreeing to dialysis -Continue with dialysis MWF -Outpatient dialysis arranged at [facility] but waiting for formal discharge plan given possibly going to CIR -[facility] in place with VVS.  Appreciate help -used AVG 5/15, 5/17.  Next HD will be Monday (tomorrow) using AVG and if this is successful will pull TDC next week.  Using 16 g needles, ? If he needs to advance to 15g   Atrial fibrillation with RVR: Persistent tachycardia.  Management per primary team   Anemia of CKD: Persistently low hemoglobin.  Iron depleted and IV iron as ordered.  Increase ESA due on Friday.   Hypertension: Blood pressure acceptable on current medications.   Secondary hyperparathyroidism: With hypocalcemia and hyperphosphatemia.  On PhosLo.  Started calcitriol 0.5 mcg daily.  Ca and phos much improved. Decrease phosloContinue to monitor  Subjective:    For HD tomorrow   Objective:   BP (!) 149/89 (BP Location: Right Arm)   Pulse (!) 126   Temp 97.8 F (36.6 C) (Oral)   Resp 19   Ht 5\' 9"  (1.753 m)   Wt 74.5 kg Comment: bed  SpO2 98%   BMI 24.25 kg/m   Intake/Output Summary (Last 24 hours) at 06/17/2022 1252 Last data filed at 06/17/2022 1042 Gross per 24 hour  Intake 240 ml  Output 1250 ml  Net -1010 ml   Weight change:   Physical Exam: ZOX:WRUEA appearing CVS: irregular Resp: clear Abd: soft Ext: no LE edema ACCESS: TDC, AVG  Imaging: No results found.  Labs: BMET Recent Labs  Lab 06/11/22 0943 06/12/22 0157 06/13/22 0156 06/15/22 0855  NA 136 135 134* 135  K 5.4* 5.2* 5.1 4.7  CL 101 96* 95* 99  CO2 25 27 24 25   GLUCOSE 97 101* 98 90  BUN 65* 31* 52* 55*  CREATININE 6.73* 3.86* 4.93* 5.91*  CALCIUM 8.3* 8.4* 8.4* 8.6*  PHOS 3.6 2.9 3.7 4.9*   CBC Recent Labs  Lab 06/12/22 0157 06/13/22 0156  06/14/22 0154 06/15/22 0202  WBC 7.5 6.5 6.6 6.5  HGB 7.8* 7.6* 8.1* 8.0*  HCT 25.2* 25.1* 26.2* 26.0*  MCV 95.8 95.8 96.3 95.6  PLT 199 204 241 231    Medications:     sodium chloride   Intravenous Once   apixaban  2.5 mg Oral BID   calcitRIOL  0.5 mcg Oral Daily   calcium acetate  667 mg Oral TID WC   Chlorhexidine Gluconate Cloth  6 each Topical Q0600   darbepoetin (ARANESP) injection - DIALYSIS  100 mcg Subcutaneous Q Fri-1800   diltiazem  300 mg Oral Daily   feeding supplement (NEPRO CARB STEADY)  237 mL Oral BID BM   loratadine  10 mg Oral Daily   Bufford Buttner MD 06/17/2022, 12:52 PM

## 2022-06-17 NOTE — Progress Notes (Signed)
Rounding Note    Patient Name: Glenn Olson Date of Encounter: 06/17/2022  [facility] Cardiologist: None   Subjective   BP 123/72  Denies any chest pain or dyspnea  Inpatient Medications    Scheduled Meds:  sodium chloride   Intravenous Once   apixaban  2.5 mg Oral BID   calcitRIOL  0.5 mcg Oral Daily   calcium acetate  667 mg Oral TID WC   Chlorhexidine Gluconate Cloth  6 each Topical Q0600   darbepoetin (ARANESP) injection - DIALYSIS  100 mcg Subcutaneous Q Fri-1800   diltiazem  300 mg Oral Daily   feeding supplement (NEPRO CARB STEADY)  237 mL Oral BID BM   loratadine  10 mg Oral Daily   Continuous Infusions:  PRN Meds: acetaminophen **OR** acetaminophen, diltiazem, guaiFENesin-dextromethorphan, HYDROmorphone (DILAUDID) injection, labetalol, melatonin, ondansetron (ZOFRAN) IV, mouth rinse, polyethylene glycol   Vital Signs    Vitals:   06/16/22 1631 06/16/22 1935 06/16/22 2326 06/17/22 0237  BP: (!) 114/58 114/72 (!) 113/92 123/72  Pulse: 85 97 100 90  Resp: 20 20 20 20   Temp: 97.7 F (36.5 C) 97.8 F (36.6 C) 98.2 F (36.8 C) 97.8 F (36.6 C)  TempSrc: Oral Oral Oral Oral  SpO2: 94% 95% 98% 96%  Weight:      Height:        Intake/Output Summary (Last 24 hours) at 06/17/2022 0825 Last data filed at 06/17/2022 0600 Gross per 24 hour  Intake 600 ml  Output 1250 ml  Net -650 ml       06/15/2022    1:11 PM 06/15/2022    8:49 AM 06/15/2022    5:34 AM  Last 3 Weights  Weight (lbs) 164 lb 3.9 oz 163 lb 12.8 oz 159 lb 13.3 oz  Weight (kg) 74.5 kg 74.3 kg 72.5 kg      Telemetry   AFL with variable conduction, currently in 90-120s  - Personally Reviewed  ECG    No new tracings- Personally Reviewed  Physical Exam   GEN: No acute distress.   Neck: No JVD Cardiac: Irregular rhythm, tachycardic Respiratory: Clear to auscultation bilaterally. Normal work of breathing  GI: Soft, nontender, non-distended  MS: No edema in BLE; No  deformity. Neuro:  Nonfocal  Psych: Normal affect   Labs    High Sensitivity Troponin:   Recent Labs  Lab 05/30/22 1445 05/30/22 1640  TROPONINIHS 48* 52*      Chemistry Recent Labs  Lab 06/12/22 0157 06/13/22 0156 06/15/22 0855  NA 135 134* 135  K 5.2* 5.1 4.7  CL 96* 95* 99  CO2 27 24 25   GLUCOSE 101* 98 90  BUN 31* 52* 55*  CREATININE 3.86* 4.93* 5.91*  CALCIUM 8.4* 8.4* 8.6*  MG 1.6* 1.9  --   ALBUMIN 2.8* 2.8* 2.8*  GFRNONAA 15* 11* 9*  ANIONGAP 12 15 11      Lipids No results for input(s): "CHOL", "TRIG", "HDL", "LABVLDL", "LDLCALC", "CHOLHDL" in the last 168 hours.  Hematology Recent Labs  Lab 06/13/22 0156 06/14/22 0154 06/15/22 0202  WBC 6.5 6.6 6.5  RBC 2.62* 2.72* 2.72*  HGB 7.6* 8.1* 8.0*  HCT 25.1* 26.2* 26.0*  MCV 95.8 96.3 95.6  MCH 29.0 29.8 29.4  MCHC 30.3 30.9 30.8  RDW 14.8 15.1 15.0  PLT 204 241 231    Thyroid No results for input(s): "TSH", "FREET4" in the last 168 hours.  BNPNo results for input(s): "BNP", "PROBNP" in the last 168 hours.  DDimer  No results for input(s): "DDIMER" in the last 168 hours.   Radiology    No results found.  Cardiac Studies   Echocardiogram 06/05/22 1. Left ventricular ejection fraction, by estimation, is 55 to 60%. The  left ventricle has normal function. The left ventricle has no regional  wall motion abnormalities. There is mild concentric left ventricular  hypertrophy. Left ventricular diastolic  function could not be evaluated.   2. Right ventricular systolic function is normal. The right ventricular  size is normal. There is mildly elevated pulmonary artery systolic  pressure. The estimated right ventricular systolic pressure is 44.2 mmHg.   3. Left atrial size was moderately dilated.   4. Right atrial size was mildly dilated.   5. The mitral valve is grossly normal. Trivial mitral valve  regurgitation. No evidence of mitral stenosis.   6. The aortic valve is tricuspid. Aortic valve  regurgitation is not  visualized. No aortic stenosis is present.   7. The inferior vena cava is dilated in size with <50% respiratory  variability, suggesting right atrial pressure of 15 mmHg.   Patient Profile     82 y.o. male with a past medical history of end-stage renal disease (had not attended dialysis in over a year), neurogenic atonic bladder. Patient is currently admitted with acute uremic encephalopathy.  Cardiology was consulted on 5/7 for new onset atrial fibs/flutter with RVR.  He was treated with diltiazem 240 mg daily and Eliquis 5 mg twice daily.  On 5/12, rates were well-controlled and cardiology signed off.  Cardiology is being reengaged on 5/17 because heart rates again became elevated.  Assessment & Plan    Atrial fibrillation/flutter - As above, cardiology was consulted on 5/7 for new onset atrial fibrillation/flutter with RVR. - Patient has been on Eliquis 2.5 mg twice daily (dose reduced for age, renal function)  - Echocardiogram 06/05/2022 showed EF 55-60%, no regional wall motion abnormalities, mild LVH, normal RV systolic function, moderately dilated left atrium -On 5/17, cardiology was reengaged because of A-fib with RVR. Cardizem increased to 300 mg daily -Rates appear better controlled but has been intermittently in 2:1 flutter with rates 120s.  Considering this and given new onset AF/AFL, recommend trying to restore sinus rhythm.  Will plan for TEE/DCCV on 5/20.  After careful review of history and examination, the risks and benefits of transesophageal echocardiogram have been explained including risks of esophageal damage, perforation (1:10,000 risk), bleeding, pharyngeal hematoma as well as other potential complications associated with conscious sedation including aspiration, arrhythmia, respiratory failure and death. Alternatives to treatment were discussed, questions were answered. Patient is willing to proceed.   AMS: Presented with uremic encephalopathy due to  ESRD and being off HD for a year.  Improved with restarting dialysis  ESRD: On HD   For questions or updates, please contact [facility] Please consult [email or web address] for contact info under        Signed, Little Ishikawa, MD  06/17/2022, 8:25 AM

## 2022-06-17 NOTE — Progress Notes (Signed)
PROGRESS NOTE Glenn Olson  SWN:[id number] DOB: [birth year] DOA: 05/30/2022 PCP: Benita Stabile, MD  Brief Narrative/Hospital Course: 82 y.o. male with medical history significant for ESRD, neurogenic atonic bladder presented to [facility] with headache and chest pain for 1 week.  Patient was on hemodialysis but had stopped going for hemodialysis for 1 year now.  Has been having poor oral intake with nausea and vomiting.  In the ED, patient was mildly tachypneic and tachycardic with elevated blood pressure.  Chest x-ray with increased left lower lower lobe markings. CO2 less than 7.  BUN of 241.  Potassium 4.5.  Cr 13.8.Troponin 48 > 52.  Nephrology was consulted and decision was to proceed with hemodialysis.  Patient was then transferred to [facility].   Patient has had HD cath placed and started on hemodialysis.  Encephalopathy improving. Hospital course complicated by A-fib with RVR.  He was started on p.o. Cardizem and IV heparin.  TTE without significant finding.  RVR improved. Transitioned to p.o. Cardizem CD, Eliquis and signed off by cardiology. Patient was treated for anemia of CKD, hypertension, secondary hyperparathyroidism with hypocalcemia and hyperphosphatemia.Remains weak and deconditioned with debility PT OT recommending CIR and awaiting for placement     Subjective: Seen and examined this morning Overnight afebrile heart rate in 120s A-fib, denies any symptoms  Assessment and Plan: Principal Problem:   Uremia Active Problems:   Metabolic acidosis   Chest pain   Atonic neurogenic bladder   ESRD on hemodialysis (HCC)   Chronic indwelling Foley catheter   Hypokalemia   Hypomagnesemia   Atrial flutter (HCC)   Persistent atrial fibrillation (HCC)   Malnutrition of moderate degree  New onset a flutter/A-fib with RVR: TTE without significant finding. HR remains poorly controlled, continue on increased dose of Cardizem 300 mg, cardiology back on board 5/17.Given  difficulty in rate control Cardio planning for TEE/DCCV on 5/20.Continue on Eliquis,monitor in tele.  Acute uremic encephalopathy 2/2 ESRD after being off HD for a year: Mental status back to baseline continue HD continue PT OT and rehab placement   ESRD:Continue HD per schedule,nephrology managing.Chest [facility] in place, but using his left AVG for HD, so likely pull out [facility]?Marland Kitchen Secondary hyperparathyroidism with hypocalcium and hyperphosphatemia> continue PhosLo,>started on  Calcitrol 0.5 mcg daily> calcium and phos much better,decreasing PhosLo plan per nephrology. Hypokalemia/hypocalcemia/hypomagnesemia/Hyperkalemia- labs stable as below. Recent Labs  Lab 06/11/22 0943 06/12/22 0157 06/13/22 0156 06/15/22 0855  K 5.4* 5.2* 5.1 4.7  CALCIUM 8.3* 8.4* 8.4* 8.6*  MG  --  1.6* 1.9  --   PHOS 3.6 2.9 3.7 4.9*   Atonic neurogenic bladder: cont SP catheter and catheter care by RN. Anemia of chronic kidney disease hb stable as below- s/p 1 unit prbc 06/01/22. Recent Labs  Lab 06/11/22 0123 06/12/22 0157 06/13/22 0156 06/14/22 0154 06/15/22 0202  HGB 7.4* 7.8* 7.6* 8.1* 8.0*  HCT 23.9* 25.2* 25.1* 26.2* 26.0*   Deconditioning, debility: Continue with PT OT awaiting for CIR once a fib better.     Thrombocytopenia: Resolved.  DVT prophylaxis: apixaban (ELIQUIS) tablet 2.5 mg Start: 06/09/22 1700 SCDs Start: 05/30/22 1926 Code Status:   Code Status: DNR Family Communication: plan of care discussed with patient at bedside. Patient status is:  inpatient because of awaiting CIR once heart rate is optimized Level of care: Telemetry Cardiac   Dispo: The patient is from: home            Anticipated disposition: CIR pending improvement in heart  rate   Objective: Vitals last 24 hrs: Vitals:   06/16/22 1631 06/16/22 1935 06/16/22 2326 06/17/22 0237  BP: (!) 114/58 114/72 (!) 113/92 123/72  Pulse: 85 97 100 90  Resp: 20 20 20 20   Temp: 97.7 F (36.5 C) 97.8 F (36.6 C) 98.2 F (36.8 C)  97.8 F (36.6 C)  TempSrc: Oral Oral Oral Oral  SpO2: 94% 95% 98% 96%  Weight:      Height:       Weight change:   Physical Examination: General exam: AA, weak,older appearing HEENT:Oral mucosa moist, Ear/Nose WNL grossly, dentition normal. Respiratory system: bilaterally clear BS, no use of accessory muscle Cardiovascular system: S1 & S2 +, irregular and tachycardic heart rate. Gastrointestinal system: Abdomen soft,NT,ND,BS+ Nervous System:Alert, awake, moving extremities and grossly nonfocal Extremities: LE ankle edema neg, lower extremities warm Skin: No rashes,no icterus. MSK: Normal muscle bulk,tone, power r  Chest TDC+ SP catheter+  Medications reviewed:  Scheduled Meds:  sodium chloride   Intravenous Once   apixaban  2.5 mg Oral BID   calcitRIOL  0.5 mcg Oral Daily   calcium acetate  667 mg Oral TID WC   Chlorhexidine Gluconate Cloth  6 each Topical Q0600   darbepoetin (ARANESP) injection - DIALYSIS  100 mcg Subcutaneous Q Fri-1800   diltiazem  300 mg Oral Daily   feeding supplement (NEPRO CARB STEADY)  237 mL Oral BID BM   loratadine  10 mg Oral Daily   Continuous Infusions:    Diet Order             Diet renal with fluid restriction Fluid restriction: 1200 mL Fluid; Room service appropriate? Yes; Fluid consistency: Thin  Diet effective now                    Nutrition Problem: Moderate Malnutrition Etiology: chronic illness (ESRD) Signs/Symptoms: moderate fat depletion, moderate muscle depletion, severe muscle depletion Interventions: Magic cup, MVI, Carnation Instant Breakfast   Intake/Output Summary (Last 24 hours) at 06/17/2022 0804 Last data filed at 06/17/2022 0600 Gross per 24 hour  Intake 600 ml  Output 1250 ml  Net -650 ml    Net IO Since Admission: -10,344 mL [06/17/22 0804]  Wt Readings from Last 3 Encounters:  06/15/22 74.5 kg  03/17/21 80.3 kg  02/22/21 77.6 kg     Unresulted Labs (From admission, onward)     Start     Ordered    Signed and Held  Protime-INR  ONCE - STAT,   R       Question:  Specimen collection method  Answer:  Lab=Lab collect   Signed and Held          Data Reviewed: I have personally reviewed following labs and imaging studies CBC: Recent Labs  Lab 06/11/22 0123 06/12/22 0157 06/13/22 0156 06/14/22 0154 06/15/22 0202  WBC 7.7 7.5 6.5 6.6 6.5  HGB 7.4* 7.8* 7.6* 8.1* 8.0*  HCT 23.9* 25.2* 25.1* 26.2* 26.0*  MCV 95.6 95.8 95.8 96.3 95.6  PLT 174 199 204 241 231    Basic Metabolic Panel: Recent Labs  Lab 06/11/22 0943 06/12/22 0157 06/13/22 0156 06/15/22 0855  NA 136 135 134* 135  K 5.4* 5.2* 5.1 4.7  CL 101 96* 95* 99  CO2 25 27 24 25   GLUCOSE 97 101* 98 90  BUN 65* 31* 52* 55*  CREATININE 6.73* 3.86* 4.93* 5.91*  CALCIUM 8.3* 8.4* 8.4* 8.6*  MG  --  1.6* 1.9  --  PHOS 3.6 2.9 3.7 4.9*    Recent Labs  Lab 06/11/22 0943 06/12/22 0157 06/13/22 0156 06/15/22 0855  ALBUMIN 2.7* 2.8* 2.8* 2.8*    No results found for this or any previous visit (from the past 240 hour(s)).   Antimicrobials: Anti-infectives (From admission, onward)    Start     Dose/Rate Route Frequency Ordered Stop   05/31/22 1115  ceFAZolin (ANCEF) IVPB 2g/100 mL premix        2 g 200 mL/hr over 30 Minutes Intravenous  Once 05/31/22 1108 05/31/22 1200   05/31/22 1112  ceFAZolin (ANCEF) 2-4 GM/100ML-% IVPB       Note to Pharmacy: Shanda Bumps M: cabinet override      05/31/22 1112 05/31/22 1206      Culture/Microbiology    Component Value Date/Time   SDES  11/29/2020 1705    URINE, CLEAN CATCH Performed at [facility], [address] [place], [place] [address]    [facility]  11/29/2020 1705    NONE Performed at [facility], [address], [place], [place] [address]    CULT MULTIPLE SPECIES PRESENT, SUGGEST RECOLLECTION (A) 11/29/2020 1705   REPTSTATUS 12/01/2020 FINAL 11/29/2020 1705  Radiology Studies: No results found.   LOS: 18 days   Lanae Boast, MD [facility]  06/17/2022, 8:04 AM

## 2022-06-18 DIAGNOSIS — N19 Unspecified kidney failure: Secondary | ICD-10-CM | POA: Diagnosis not present

## 2022-06-18 LAB — PROTIME-INR
INR: 1.4 — ABNORMAL HIGH (ref 0.8–1.2)
Prothrombin Time: 17 seconds — ABNORMAL HIGH (ref 11.4–15.2)

## 2022-06-18 MED ORDER — SODIUM CHLORIDE 0.9 % IV SOLN: INTRAVENOUS | Status: DC

## 2022-06-18 NOTE — Progress Notes (Signed)
PT Cancellation Note  Patient Details Name: Glenn Olson MRN: [id number] DOB: Oct 07, 1940   Cancelled Treatment:    Reason Eval/Treat Not Completed: (P) Patient at procedure or test/unavailable Pt is off floor for HD. PT will follow back for treatment this afternoon as able.  Antwaine Boomhower B. Beverely Risen PT, DPT Acute Rehabilitation Services Please use secure chat or  Call Office [phone]     Elon Alas [facility] 06/18/2022, 7:34 AM

## 2022-06-18 NOTE — Progress Notes (Signed)
PROGRESS NOTE Glenn Olson  WUJ:[id number] DOB: Jan 27, 1941 DOA: 05/30/2022 PCP: Benita Stabile, MD  Brief Narrative/Hospital Course: 82 y.o. male with medical history significant for ESRD, neurogenic atonic bladder presented to [facility] with headache and chest pain for 1 week.  Patient was on hemodialysis but had stopped going for hemodialysis for 1 year now.  Has been having poor oral intake with nausea and vomiting.  In the ED, patient was mildly tachypneic and tachycardic with elevated blood pressure.  Chest x-ray with increased left lower lower lobe markings. CO2 less than 7.  BUN of 241.  Potassium 4.5.  Cr 13.8.Troponin 48 > 52.  Nephrology was consulted and decision was to proceed with hemodialysis.  Patient was then transferred to [facility]   Patient has had HD cath placed and started on hemodialysis.  Encephalopathy improving. Hospital course complicated by A-fib with RVR.  He was started on p.o. Cardizem and IV heparin.  TTE without significant finding.  RVR improved. Transitioned to p.o. Cardizem CD, Eliquis and signed off by cardiology. Patient was treated for anemia of CKD, hypertension, secondary hyperparathyroidism with hypocalcemia and hyperphosphatemia.Remains weak and deconditioned with debility PT OT recommending CIR and awaiting for placement     Subjective: Seen and examined this morning in dialysis Overnight afebrile.   Overall not having any new complaints of chest pain shortness of breath or palpitation.   He is scheduled for TEE/DCCV today and getting dialysis  Assessment and Plan: Principal Problem:   Uremia Active Problems:   Metabolic acidosis   Chest pain   Atonic neurogenic bladder   ESRD on hemodialysis (HCC)   Chronic indwelling Foley catheter   Hypokalemia   Hypomagnesemia   Atrial flutter (HCC)   Persistent atrial fibrillation (HCC)   Malnutrition of moderate degree  New onset a flutter/A-fib with RVR:TTE unremarkable, heart rate  remains poorly controlled despite increasing Cardizem and cardiology was reconsulted 5/17 and planning for TEE/DCCV.  Continue on Eliquis. Monitor in tele.  Acute uremic encephalopathy 2/2 ESRD after being off HD for a year: Mental status has resolved after being on dialysis  ESRD: Nephrology following, getting HD 5/20.Chest [facility] in place, but using his left AVG for HD, so likely pull out [facility] soon. Secondary hyperparathyroidism with hypocalcium and hyperphosphatemia> continue PhosLo,>started on  Calcitrol 0.5 mcg daily> calcium and phos much better,decreasing PhosLo plan per nephrology. Hypokalemia/hypocalcemia/hypomagnesemia/Hyperkalemia- labs stable as below. Recent Labs  Lab 06/11/22 0943 06/12/22 0157 06/13/22 0156 06/15/22 0855  K 5.4* 5.2* 5.1 4.7  CALCIUM 8.3* 8.4* 8.4* 8.6*  MG  --  1.6* 1.9  --   PHOS 3.6 2.9 3.7 4.9*  Atonic neurogenic bladder: cont SP catheter and catheter care by RN. Anemia of chronic kidney disease hb stable holding 8 g range,s/p 1 unit prbc 06/01/22. Recent Labs  Lab 06/12/22 0157 06/13/22 0156 06/14/22 0154 06/15/22 0202  HGB 7.8* 7.6* 8.1* 8.0*  HCT 25.2* 25.1* 26.2* 26.0*  Deconditioning, debility: Continue with PT OT awaiting for CIR once a fib better.     Thrombocytopenia: Resolved.  Moderate malnutrition RD following augment diet as below Nutrition Problem: Moderate Malnutrition Etiology: chronic illness (ESRD) Signs/Symptoms: moderate fat depletion, moderate muscle depletion, severe muscle depletion Interventions: Magic cup, MVI, Carnation Instant Breakfast  DVT prophylaxis: apixaban (ELIQUIS) tablet 2.5 mg Start: 06/09/22 1700 SCDs Start: 05/30/22 1926 Code Status:   Code Status: DNR Family Communication: plan of care discussed with patient at bedside. Patient status is:  inpatient because of awaiting CIR  once heart rate is optimized Level of care: Telemetry Cardiac   Dispo: The patient is from: home            Anticipated disposition:  CIR pending DCCV and stable HR  Objective: Vitals last 24 hrs: Vitals:   06/18/22 0735 06/18/22 0748 06/18/22 0805 06/18/22 0830  BP: 117/73 130/72 124/82 (!) 124/90  Pulse: 83 97 (!) 113 (!) 125  Resp: 17 (!) 21 (!) 25 (!) 22  Temp: 98.1 F (36.7 C)     TempSrc: Oral     SpO2: 96% 96% 99% 100%  Weight:      Height:       Weight change:   Physical Examination: General exam: AAox3, weak,older appearing HEENT:Oral mucosa moist, Ear/Nose WNL grossly, dentition normal. Respiratory system: bilaterally clear BS, no use of accessory muscle, chest TDC present Cardiovascular system: S1 & S2 +, irregular rate and rapid in 120s. Gastrointestinal system: Abdomen soft, NT,ND,BS+, SP cath + Nervous System:Alert, awake, moving extremities and grossly nonfocal Extremities: LE ankle edema eng, lower extremities warm Skin: No rashes,no icterus. MSK: Normal muscle bulk,tone, power   Medications reviewed:  Scheduled Meds:  sodium chloride   Intravenous Once   apixaban  2.5 mg Oral BID   calcitRIOL  0.5 mcg Oral Daily   calcium acetate  667 mg Oral TID WC   Chlorhexidine Gluconate Cloth  6 each Topical Q0600   darbepoetin (ARANESP) injection - DIALYSIS  100 mcg Subcutaneous Q Fri-1800   diltiazem  300 mg Oral Daily   feeding supplement (NEPRO CARB STEADY)  237 mL Oral BID BM   loratadine  10 mg Oral Daily   Continuous Infusions:  sodium chloride 20 mL/hr at 06/18/22 0523    Diet Order             Diet NPO time specified  Diet effective midnight                    Intake/Output Summary (Last 24 hours) at 06/18/2022 0850 Last data filed at 06/18/2022 0500 Gross per 24 hour  Intake 490 ml  Output 1700 ml  Net -1210 ml   Net IO Since Admission: -11,554 mL [06/18/22 0850]  Wt Readings from Last 3 Encounters:  06/18/22 74.1 kg  03/17/21 80.3 kg  02/22/21 77.6 kg     Unresulted Labs (From admission, onward)    None     Data Reviewed: I have personally reviewed following  labs and imaging studies CBC: Recent Labs  Lab 06/12/22 0157 06/13/22 0156 06/14/22 0154 06/15/22 0202  WBC 7.5 6.5 6.6 6.5  HGB 7.8* 7.6* 8.1* 8.0*  HCT 25.2* 25.1* 26.2* 26.0*  MCV 95.8 95.8 96.3 95.6  PLT 199 204 241 231   Basic Metabolic Panel: Recent Labs  Lab 06/11/22 0943 06/12/22 0157 06/13/22 0156 06/15/22 0855  NA 136 135 134* 135  K 5.4* 5.2* 5.1 4.7  CL 101 96* 95* 99  CO2 25 27 24 25   GLUCOSE 97 101* 98 90  BUN 65* 31* 52* 55*  CREATININE 6.73* 3.86* 4.93* 5.91*  CALCIUM 8.3* 8.4* 8.4* 8.6*  MG  --  1.6* 1.9  --   PHOS 3.6 2.9 3.7 4.9*   Recent Labs  Lab 06/11/22 0943 06/12/22 0157 06/13/22 0156 06/15/22 0855  ALBUMIN 2.7* 2.8* 2.8* 2.8*   No results found for this or any previous visit (from the past 240 hour(s)).   Antimicrobials: Anti-infectives (From admission, onward)    Start  Dose/Rate Route Frequency Ordered Stop   05/31/22 1115  ceFAZolin (ANCEF) IVPB 2g/100 mL premix        2 g 200 mL/hr over 30 Minutes Intravenous  Once 05/31/22 1108 05/31/22 1200   05/31/22 1112  ceFAZolin (ANCEF) 2-4 GM/100ML-% IVPB       Note to Pharmacy: Shanda Bumps M: cabinet override      05/31/22 1112 05/31/22 1206      Culture/Microbiology    Component Value Date/Time   SDES  11/29/2020 1705    URINE, CLEAN CATCH Performed at [facility], [address] [place], [place] [address]    [facility]  11/29/2020 1705    NONE Performed at [facility], [address] [place], [place] [address]    CULT MULTIPLE SPECIES PRESENT, SUGGEST RECOLLECTION (A) 11/29/2020 1705   REPTSTATUS 12/01/2020 FINAL 11/29/2020 1705  Radiology Studies: No results found.   LOS: 19 days   Lanae Boast, MD [facility]  06/18/2022, 8:50 AM

## 2022-06-18 NOTE — Progress Notes (Addendum)
 Rounding Note    Patient Name: Glenn Olson Date of Encounter: 06/18/2022  [facility] Cardiologist: Jayadeep Varanasi, MD (new)  Subjective   Patient seen during hemodialysis.  He reports that he is feeling well today, no chest pain, shortness of breath, palpitations, dizziness, syncope, near syncope.  He is scheduled for TEE/DCCV today and he has no questions about the procedure.  He has been n.p.o.  Inpatient Medications    Scheduled Meds:  sodium chloride   Intravenous Once   apixaban  2.5 mg Oral BID   calcitRIOL  0.5 mcg Oral Daily   calcium acetate  667 mg Oral TID WC   Chlorhexidine Gluconate Cloth  6 each Topical Q0600   darbepoetin (ARANESP) injection - DIALYSIS  100 mcg Subcutaneous Q Fri-1800   diltiazem  300 mg Oral Daily   feeding supplement (NEPRO CARB STEADY)  237 mL Oral BID BM   loratadine  10 mg Oral Daily   Continuous Infusions:  sodium chloride 20 mL/hr at 06/18/22 0523   PRN Meds: acetaminophen **OR** acetaminophen, diltiazem, guaiFENesin-dextromethorphan, HYDROmorphone (DILAUDID) injection, labetalol, melatonin, ondansetron (ZOFRAN) IV, mouth rinse, polyethylene glycol   Vital Signs    Vitals:   06/18/22 0356 06/18/22 0732 06/18/22 0735 06/18/22 0748  BP: 120/82  117/73 130/72  Pulse: 80  83 97  Resp: 20  17 (!) 21  Temp: 98.3 F (36.8 C)  98.1 F (36.7 C)   TempSrc: Oral  Oral   SpO2: 98%  96% 96%  Weight:  74.1 kg    Height:        Intake/Output Summary (Last 24 hours) at 06/18/2022 0750 Last data filed at 06/18/2022 0500 Gross per 24 hour  Intake 490 ml  Output 1700 ml  Net -1210 ml      06/18/2022    7:32 AM 06/15/2022    1:11 PM 06/15/2022    8:49 AM  Last 3 Weights  Weight (lbs) 163 lb 5.8 oz 164 lb 3.9 oz 163 lb 12.8 oz  Weight (kg) 74.1 kg 74.5 kg 74.3 kg      Telemetry    Atrial flutter, heart rate in the 90s-110s- Personally Reviewed  ECG     Atrial flutter with variable AV block, HR 90 BPM, RBBB  present- Personally Reviewed  Physical Exam   GEN: No acute distress. Laying in the bed with head elevated  Neck: No JVD Cardiac: Irregular rate and rhythm, no murmurs, rubs, or gallops. Radial pulses 2+ bilaterally  Respiratory: Anterior lung exam clear to auscultation. Normal work of breathing on room air  GI: Soft, nontender, non-distended  MS: No edema in BLE; No deformity. Neuro:  Nonfocal  Psych: Normal affect   Labs    High Sensitivity Troponin:   Recent Labs  Lab 05/30/22 1445 05/30/22 1640  TROPONINIHS 48* 52*     Chemistry Recent Labs  Lab 06/12/22 0157 06/13/22 0156 06/15/22 0855  NA 135 134* 135  K 5.2* 5.1 4.7  CL 96* 95* 99  CO2 27 24 25  GLUCOSE 101* 98 90  BUN 31* 52* 55*  CREATININE 3.86* 4.93* 5.91*  CALCIUM 8.4* 8.4* 8.6*  MG 1.6* 1.9  --   ALBUMIN 2.8* 2.8* 2.8*  GFRNONAA 15* 11* 9*  ANIONGAP 12 15 11    Lipids No results for input(s): "CHOL", "TRIG", "HDL", "LABVLDL", "LDLCALC", "CHOLHDL" in the last 168 hours.  Hematology Recent Labs  Lab 06/13/22 0156 06/14/22 0154 06/15/22 0202  WBC 6.5 6.6 6.5  RBC   2.62* 2.72* 2.72*  HGB 7.6* 8.1* 8.0*  HCT 25.1* 26.2* 26.0*  MCV 95.8 96.3 95.6  MCH 29.0 29.8 29.4  MCHC 30.3 30.9 30.8  RDW 14.8 15.1 15.0  PLT 204 241 231   Thyroid No results for input(s): "TSH", "FREET4" in the last 168 hours.  BNPNo results for input(s): "BNP", "PROBNP" in the last 168 hours.  DDimer No results for input(s): "DDIMER" in the last 168 hours.   Radiology    No results found.  Cardiac Studies   Echocardiogram 06/05/22 1. Left ventricular ejection fraction, by estimation, is 55 to 60%. The  left ventricle has normal function. The left ventricle has no regional  wall motion abnormalities. There is mild concentric left ventricular  hypertrophy. Left ventricular diastolic  function could not be evaluated.   2. Right ventricular systolic function is normal. The right ventricular  size is normal. There is mildly  elevated pulmonary artery systolic  pressure. The estimated right ventricular systolic pressure is 44.2 mmHg.   3. Left atrial size was moderately dilated.   4. Right atrial size was mildly dilated.   5. The mitral valve is grossly normal. Trivial mitral valve  regurgitation. No evidence of mitral stenosis.   6. The aortic valve is tricuspid. Aortic valve regurgitation is not  visualized. No aortic stenosis is present.   7. The inferior vena cava is dilated in size with <50% respiratory  variability, suggesting right atrial pressure of 15 mmHg.   Patient Profile     82 y.o. male  with a past medical history of end-stage renal disease (had not attended dialysis in over a year), neurogenic atonic bladder. Patient is currently admitted with acute uremic encephalopathy.  Cardiology was consulted on 5/7 for new onset atrial fibs/flutter with RVR.  He was treated with diltiazem 240 mg daily and Eliquis 5 mg twice daily.  On 5/12, rates were well-controlled and cardiology signed off.   Cardiology was reengaged on 5/17 because heart rates were elvatated   Assessment & Plan    New onset atrial flutter with RVR  - As above, cardiology was consulted on 5/7 for new onset atrial fibrillation/flutter with RVR. Reengaged on 5/17 due to elevated HR  - Echocardiogram 06/05/2022 showed EF 55-60%, no regional wall motion abnormalities, mild LVH, normal RV systolic function, moderately dilated left atrium - Patient is now on Cardizem cd 300 mg daily. Per telemetry, patient remains in atrial flutter with HR in the 90s-110s  - EKG this AM showed atrial flutter with HR 90 BPM  - HR has been difficult to control this admission- plan for TEE guided DCCV today. Patient has been NPO. See progress note from 5/19 for consent  - Continue eliquis 2.5 mg BID - dose reduced for age, renal function   Otherwise Per Primary  - Acute uremic encephalopathy  - ESRD  - Secondary Hyperparathyroidism  - Atonic neurogenic  bladder - Anemia of chronic disease  Deconditioning   For questions or updates, please contact [facility] Please consult [email or web address] for contact info under        Signed, Kathleen R. Johnson, PA-C  06/18/2022, 7:50 AM    Patient seen and examined with KJ PA-C.  Agree as above, with the following exceptions and changes as noted below. Pt seen after dialysis, could not accommodate procedure today. Gen: NAD, CV: iRRR, no murmurs, Lungs: clear, Abd: soft, Extrem: Warm, well perfused, no edema, Neuro/Psych: alert and oriented x 3, normal mood and affect. All available   labs, radiology testing, previous records reviewed. Plan for TEE/DCCV tomorrow. Rate well controlled.   Jimma Ortman A Raelan Burgoon, MD 06/18/22 2:39 PM  

## 2022-06-18 NOTE — Progress Notes (Signed)
[facility] Progress Note   Assessment/ Plan:   ESRD: History of such and was off dialysis for some time. Looked into PD but not an option given adhesions. Now ESRD again and agreeing to dialysis -Continue with dialysis MWF -Outpatient dialysis arranged at [facility] but waiting for formal discharge plan given possibly going to CIR -[facility] in place with VVS.  Appreciate help -used AVG 5/15, 5/17.    Seen on HD using Lt FAL AVG and if this is successful mid week will pull TDC.  Would like to advance to 15g as tolerated; has seat at [facility] TTS 610AM.   Atrial fibrillation with RVR: Persistent tachycardia.  Management per primary team -> TEE guided DCCV today, on Eliquis   Anemia of CKD: Persistently low hemoglobin.  Iron depleted and IV iron as ordered.  Increase ESA due on Friday.   Hypertension: Blood pressure acceptable on current medications.   Secondary hyperparathyroidism: With hypocalcemia and hyperphosphatemia.  On PhosLo.  Started calcitriol 0.5 mcg daily.  Ca and phos much improved. Decrease phosloContinue to monitor  Subjective:    Tolerating HD today.    Objective:   BP (!) 153/80 (BP Location: Right Arm)   Pulse (!) 125   Temp 98.1 F (36.7 C) (Oral)   Resp (!) 22   Ht 5\' 9"  (1.753 m)   Wt 74.1 kg Comment: bed  SpO2 96%   BMI 24.12 kg/m   Intake/Output Summary (Last 24 hours) at 06/18/2022 1055 Last data filed at 06/18/2022 0500 Gross per 24 hour  Intake 250 ml  Output 1700 ml  Net -1450 ml   Weight change:   Physical Exam: ZOX:WRUEA appearing CVS: irregular Resp: clear Abd: soft Ext: no LE edema ACCESS: TDC, AVG  Imaging: No results found.  Labs: BMET Recent Labs  Lab 06/12/22 0157 06/13/22 0156 06/15/22 0855  NA 135 134* 135  K 5.2* 5.1 4.7  CL 96* 95* 99  CO2 27 24 25   GLUCOSE 101* 98 90  BUN 31* 52* 55*  CREATININE 3.86* 4.93* 5.91*  CALCIUM 8.4* 8.4* 8.6*  PHOS 2.9 3.7 4.9*   CBC Recent Labs  Lab 06/12/22 0157  06/13/22 0156 06/14/22 0154 06/15/22 0202  WBC 7.5 6.5 6.6 6.5  HGB 7.8* 7.6* 8.1* 8.0*  HCT 25.2* 25.1* 26.2* 26.0*  MCV 95.8 95.8 96.3 95.6  PLT 199 204 241 231    Medications:     sodium chloride   Intravenous Once   apixaban  2.5 mg Oral BID   calcitRIOL  0.5 mcg Oral Daily   calcium acetate  667 mg Oral TID WC   Chlorhexidine Gluconate Cloth  6 each Topical Q0600   darbepoetin (ARANESP) injection - DIALYSIS  100 mcg Subcutaneous Q Fri-1800   diltiazem  300 mg Oral Daily   feeding supplement (NEPRO CARB STEADY)  237 mL Oral BID BM   loratadine  10 mg Oral Daily

## 2022-06-18 NOTE — Progress Notes (Signed)
Received patient in bed to unit.  Alert and oriented.  Informed consent signed and in chart.   TX duration:3.5  Patient tolerated well.  Transported back to the room  Alert, without acute distress.  Hand-off given to patient's nurse.   Access used: left AVG Access issues: none  Total UF removed: 1L Medication(s) given: none pt npo   06/18/22 1133  Vitals  Temp 97.9 F (36.6 C)  Temp Source Oral  BP 131/80  MAP (mmHg) 94  BP Location Right Arm  BP Method Automatic  Patient Position (if appropriate) Lying  Pulse Rate 98  Pulse Rate Source Monitor  ECG Heart Rate 99  Resp (!) 21  Oxygen Therapy  SpO2 100 %  O2 Device Room Air  During Treatment Monitoring  HD Safety Checks Performed Yes  Intra-Hemodialysis Comments Tx completed;Tolerated well  Dialysis Fluid Bolus Normal Saline  Bolus Amount (mL) 300 mL  Fistula / Graft Left Forearm Arteriovenous vein graft  Placement Date/Time: 09/27/20 1011   Placed prior to admission: No  Orientation: Left  Access Location: Forearm  Access Type: (c) Arteriovenous vein graft  Site Condition No complications  Fistula / Graft Assessment Present;Thrill;Bruit  Status Patent  Drainage Description None  Hemodialysis Catheter Right Internal jugular Double lumen Permanent (Tunneled)  Placement Date/Time: 05/31/22 1223   Placed prior to admission: No  Serial / Lot #: [id number]  Expiration Date: 12/05/26  Time Out: Correct patient;Correct site;Correct procedure  Maximum sterile barrier precautions: Hand hygiene;Cap;Mask;Sterile gow...  Site Condition No complications  Blue Lumen Status Dead end cap in place  Red Lumen Status Dead end cap in place  Purple Lumen Status N/A  Dressing Type Transparent  Dressing Status Antimicrobial disc in place;Clean, Dry, Intact  Drainage Description None  Dressing Change Due 06/22/22      Marilyne Haseley S [facility]

## 2022-06-18 NOTE — H&P (View-Only) (Signed)
Rounding Note    Patient Name: Glenn Olson Date of Encounter: 06/18/2022  [facility] Cardiologist: Lance Muss, MD (new)  Subjective   Patient seen during hemodialysis.  He reports that he is feeling well today, no chest pain, shortness of breath, palpitations, dizziness, syncope, near syncope.  He is scheduled for TEE/DCCV today and he has no questions about the procedure.  He has been n.p.o.  Inpatient Medications    Scheduled Meds:  sodium chloride   Intravenous Once   apixaban  2.5 mg Oral BID   calcitRIOL  0.5 mcg Oral Daily   calcium acetate  667 mg Oral TID WC   Chlorhexidine Gluconate Cloth  6 each Topical Q0600   darbepoetin (ARANESP) injection - DIALYSIS  100 mcg Subcutaneous Q Fri-1800   diltiazem  300 mg Oral Daily   feeding supplement (NEPRO CARB STEADY)  237 mL Oral BID BM   loratadine  10 mg Oral Daily   Continuous Infusions:  sodium chloride 20 mL/hr at 06/18/22 0523   PRN Meds: acetaminophen **OR** acetaminophen, diltiazem, guaiFENesin-dextromethorphan, HYDROmorphone (DILAUDID) injection, labetalol, melatonin, ondansetron (ZOFRAN) IV, mouth rinse, polyethylene glycol   Vital Signs    Vitals:   06/18/22 0356 06/18/22 0732 06/18/22 0735 06/18/22 0748  BP: 120/82  117/73 130/72  Pulse: 80  83 97  Resp: 20  17 (!) 21  Temp: 98.3 F (36.8 C)  98.1 F (36.7 C)   TempSrc: Oral  Oral   SpO2: 98%  96% 96%  Weight:  74.1 kg    Height:        Intake/Output Summary (Last 24 hours) at 06/18/2022 0750 Last data filed at 06/18/2022 0500 Gross per 24 hour  Intake 490 ml  Output 1700 ml  Net -1210 ml      06/18/2022    7:32 AM 06/15/2022    1:11 PM 06/15/2022    8:49 AM  Last 3 Weights  Weight (lbs) 163 lb 5.8 oz 164 lb 3.9 oz 163 lb 12.8 oz  Weight (kg) 74.1 kg 74.5 kg 74.3 kg      Telemetry    Atrial flutter, heart rate in the 90s-110s- Personally Reviewed  ECG     Atrial flutter with variable AV block, HR 90 BPM, RBBB  present- Personally Reviewed  Physical Exam   GEN: No acute distress. Laying in the bed with head elevated  Neck: No JVD Cardiac: Irregular rate and rhythm, no murmurs, rubs, or gallops. Radial pulses 2+ bilaterally  Respiratory: Anterior lung exam clear to auscultation. Normal work of breathing on room air  GI: Soft, nontender, non-distended  MS: No edema in BLE; No deformity. Neuro:  Nonfocal  Psych: Normal affect   Labs    High Sensitivity Troponin:   Recent Labs  Lab 05/30/22 1445 05/30/22 1640  TROPONINIHS 48* 52*     Chemistry Recent Labs  Lab 06/12/22 0157 06/13/22 0156 06/15/22 0855  NA 135 134* 135  K 5.2* 5.1 4.7  CL 96* 95* 99  CO2 27 24 25   GLUCOSE 101* 98 90  BUN 31* 52* 55*  CREATININE 3.86* 4.93* 5.91*  CALCIUM 8.4* 8.4* 8.6*  MG 1.6* 1.9  --   ALBUMIN 2.8* 2.8* 2.8*  GFRNONAA 15* 11* 9*  ANIONGAP 12 15 11     Lipids No results for input(s): "CHOL", "TRIG", "HDL", "LABVLDL", "LDLCALC", "CHOLHDL" in the last 168 hours.  Hematology Recent Labs  Lab 06/13/22 0156 06/14/22 0154 06/15/22 0202  WBC 6.5 6.6 6.5  RBC  2.62* 2.72* 2.72*  HGB 7.6* 8.1* 8.0*  HCT 25.1* 26.2* 26.0*  MCV 95.8 96.3 95.6  MCH 29.0 29.8 29.4  MCHC 30.3 30.9 30.8  RDW 14.8 15.1 15.0  PLT 204 241 231   Thyroid No results for input(s): "TSH", "FREET4" in the last 168 hours.  BNPNo results for input(s): "BNP", "PROBNP" in the last 168 hours.  DDimer No results for input(s): "DDIMER" in the last 168 hours.   Radiology    No results found.  Cardiac Studies   Echocardiogram 06/05/22 1. Left ventricular ejection fraction, by estimation, is 55 to 60%. The  left ventricle has normal function. The left ventricle has no regional  wall motion abnormalities. There is mild concentric left ventricular  hypertrophy. Left ventricular diastolic  function could not be evaluated.   2. Right ventricular systolic function is normal. The right ventricular  size is normal. There is mildly  elevated pulmonary artery systolic  pressure. The estimated right ventricular systolic pressure is 44.2 mmHg.   3. Left atrial size was moderately dilated.   4. Right atrial size was mildly dilated.   5. The mitral valve is grossly normal. Trivial mitral valve  regurgitation. No evidence of mitral stenosis.   6. The aortic valve is tricuspid. Aortic valve regurgitation is not  visualized. No aortic stenosis is present.   7. The inferior vena cava is dilated in size with <50% respiratory  variability, suggesting right atrial pressure of 15 mmHg.   Patient Profile     82 y.o. male  with a past medical history of end-stage renal disease (had not attended dialysis in over a year), neurogenic atonic bladder. Patient is currently admitted with acute uremic encephalopathy.  Cardiology was consulted on 5/7 for new onset atrial fibs/flutter with RVR.  He was treated with diltiazem 240 mg daily and Eliquis 5 mg twice daily.  On 5/12, rates were well-controlled and cardiology signed off.   Cardiology was reengaged on 5/17 because heart rates were elvatated   Assessment & Plan    New onset atrial flutter with RVR  - As above, cardiology was consulted on 5/7 for new onset atrial fibrillation/flutter with RVR. Reengaged on 5/17 due to elevated HR  - Echocardiogram 06/05/2022 showed EF 55-60%, no regional wall motion abnormalities, mild LVH, normal RV systolic function, moderately dilated left atrium - Patient is now on Cardizem cd 300 mg daily. Per telemetry, patient remains in atrial flutter with HR in the 90s-110s  - EKG this AM showed atrial flutter with HR 90 BPM  - HR has been difficult to control this admission- plan for TEE guided DCCV today. Patient has been NPO. 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  Agree as above, with the following exceptions and changes as noted below. Pt seen after dialysis, could not accommodate procedure today. Gen: NAD, CV: iRRR, no murmurs, Lungs: clear, Abd: soft, Extrem: Warm, well perfused, no edema, Neuro/Psych: alert and oriented x 3, normal mood and affect. All available  labs, radiology testing, previous records reviewed. Plan for TEE/DCCV tomorrow. Rate well controlled.   Parke Poisson, MD 06/18/22 2:39 PM

## 2022-06-18 NOTE — Plan of Care (Signed)
Plan of care reviewed.  Problem: Clinical Measurements: Goal: Respiratory complications will improve Outcome: Progressing: on room air, no respiratory distress noted.    Problem: Clinical Measurements: Atrial flutter on EKG 12- leads this am. HR 80s, was atrial fib with RVR before, on Cardizem 300 mg QD, PO and 30 mg PRN if HR > 120.    Goal: Cardiovascular complication will be avoided Outcome: Progressing: BP stable. HR 80s. Plan for TEE and cardioversion this am.   Problem: Activity:  Goal: Risk for activity intolerance will decrease Outcome: Progressing: ambulated to the restroom with one standby assist with walker. Pt is well tolerated. Plan transferring to CIR once medically stable after cardioversion.    Problem: Pain Managment: Goal: General experience of comfort will improve Outcome: Progressing: able to rest well overnight, denied pain.    Problem: Elimination: ESRD, HD on MWF, has had suprapubic urine catheter since 09/19/20 Goal: Will not experience complications related to urinary retention Outcome: Progressing: no obstruction of urine flow through the catheter. Plan HD today.    Problem: Elimination: Goal: Will not experience complications related to bowel motility Outcome: Progressing: normal BM 06/17/22   Filiberto Pinks, RN

## 2022-06-18 NOTE — Progress Notes (Signed)
Inpatient Rehab Admissions Coordinator:   Plans for cardiac intervention today.  Will continue to follow for readiness to transition to CIR.   Estill Dooms, PT, DPT Admissions Coordinator [phone] 06/18/22  10:10 AM

## 2022-06-18 NOTE — Progress Notes (Signed)
PT Cancellation Note  Patient Details Name: Glenn Olson MRN: [id number] DOB: August 19, 1940   Cancelled Treatment:    Reason Eval/Treat Not Completed: Fatigue/lethargy limiting ability to participate   Arlyss Gandy 06/18/2022, 5:33 PM

## 2022-06-19 ENCOUNTER — Inpatient Hospital Stay (HOSPITAL_COMMUNITY): Payer: Medicare Other | Admitting: Certified Registered Nurse Anesthetist

## 2022-06-19 ENCOUNTER — Inpatient Hospital Stay (HOSPITAL_COMMUNITY): Payer: Medicare Other

## 2022-06-19 ENCOUNTER — Encounter (HOSPITAL_COMMUNITY)
Admission: EM | Disposition: A | Discharge status: Rehabilitation Facility | Payer: Self-pay | Source: Home / Self Care | Attending: Internal Medicine

## 2022-06-19 ENCOUNTER — Encounter (HOSPITAL_COMMUNITY): Payer: Self-pay | Admitting: Internal Medicine

## 2022-06-19 DIAGNOSIS — N19 Unspecified kidney failure: Secondary | ICD-10-CM | POA: Diagnosis not present

## 2022-06-19 DIAGNOSIS — I4891 Unspecified atrial fibrillation: Secondary | ICD-10-CM

## 2022-06-19 DIAGNOSIS — I340 Nonrheumatic mitral (valve) insufficiency: Secondary | ICD-10-CM

## 2022-06-19 DIAGNOSIS — I4892 Unspecified atrial flutter: Secondary | ICD-10-CM

## 2022-06-19 DIAGNOSIS — I351 Nonrheumatic aortic (valve) insufficiency: Secondary | ICD-10-CM

## 2022-06-19 DIAGNOSIS — I361 Nonrheumatic tricuspid (valve) insufficiency: Secondary | ICD-10-CM

## 2022-06-19 HISTORY — PX: TEE WITHOUT CARDIOVERSION: SHX5443

## 2022-06-19 HISTORY — PX: CARDIOVERSION: SHX1299

## 2022-06-19 LAB — POCT I-STAT, CHEM 8
BUN: 56 mg/dL — ABNORMAL HIGH (ref 8–23)
Calcium, Ion: 1.04 mmol/L — ABNORMAL LOW (ref 1.15–1.40)
Chloride: 98 mmol/L (ref 98–111)
Creatinine, Ser: 5.3 mg/dL — ABNORMAL HIGH (ref 0.61–1.24)
Glucose, Bld: 93 mg/dL (ref 70–99)
HCT: 25 % — ABNORMAL LOW (ref 39.0–52.0)
Hemoglobin: 8.5 g/dL — ABNORMAL LOW (ref 13.0–17.0)
Potassium: 4.3 mmol/L (ref 3.5–5.1)
Sodium: 136 mmol/L (ref 135–145)
TCO2: 26 mmol/L (ref 22–32)

## 2022-06-19 LAB — ECHO TEE

## 2022-06-19 SURGERY — ECHOCARDIOGRAM, TRANSESOPHAGEAL
Anesthesia: Monitor Anesthesia Care

## 2022-06-19 MED ORDER — PROPOFOL 10 MG/ML IV BOLUS
INTRAVENOUS | Status: DC | PRN
  Administered 2022-06-19 (×2): 10 mg via INTRAVENOUS

## 2022-06-19 MED ORDER — SODIUM CHLORIDE 0.9 % IV SOLN: INTRAVENOUS | Status: DC | PRN

## 2022-06-19 MED ORDER — LIDOCAINE 2% (20 MG/ML) 5 ML SYRINGE
INTRAMUSCULAR | Status: DC | PRN
  Administered 2022-06-19: 40 mg via INTRAVENOUS
  Administered 2022-06-19: 20 mg via INTRAVENOUS

## 2022-06-19 MED ORDER — PROPOFOL 500 MG/50ML IV EMUL
INTRAVENOUS | Status: DC | PRN
  Administered 2022-06-19: 100 ug/kg/min via INTRAVENOUS

## 2022-06-19 SURGICAL SUPPLY — 1 items: ELECT DEFIB PAD ADLT CADENCE (PAD) ×1 IMPLANT

## 2022-06-19 NOTE — Interval H&P Note (Signed)
History and Physical Interval Note:  06/19/2022 8:36 AM  Larey Days  has presented today for surgery, with the diagnosis of afib/aflutter.  The various methods of treatment have been discussed with the patient and family. After consideration of risks, benefits and other options for treatment, the patient has consented to  Procedure(s): TRANSESOPHAGEAL ECHOCARDIOGRAM (N/A) CARDIOVERSION (N/A) as a surgical intervention.  The patient's history has been reviewed, patient examined, no change in status, stable for surgery.  I have reviewed the patient's chart and labs.  Questions were answered to the patient's satisfaction.    NPO for TEE/DCCV. On eliquis. EKG with flutter. [facility] labs pending.   Gerri Spore T. Flora Lipps, MD, [facility]  [facility]  [address], [address] [place], [place] [address] [phone]  8:37 AM

## 2022-06-19 NOTE — Progress Notes (Signed)
Pt is stable hemodynamically, afebrile, Atrial flutter on the monitor, HR 70s-80s. No acute distress noted overnight. NPO after midnight. Informed consent is signed. Pt is ready for TEE and  cardioversion this am.   Filiberto Pinks, RN

## 2022-06-19 NOTE — Progress Notes (Signed)
[facility] Progress Note   Assessment/ Plan:   ESRD: History of such and was off dialysis for some time. Looked into PD but not an option given adhesions. Now ESRD again and agreeing to dialysis -Continue with dialysis MWF -Outpatient dialysis arranged at [facility] but waiting for formal discharge plan given possibly going to CIR -[facility] in place with VVS.  Appreciate help -used AVG 5/15, 5/17.    Tolerated HD using Lt FAL AVG on Mon; will dialyze tomorrow and plan on removal as well.   Would like to advance to 15g as tolerated; has seat at [facility] TTS 610AM.   Atrial fibrillation with RVR: Persistent tachycardia.  Management per primary team -> TEE guided DCCV today, on Eliquis   Anemia of CKD: Persistently low hemoglobin.  Iron depleted and IV iron as ordered.  Increase ESA due on Friday.   Hypertension: Blood pressure acceptable on current medications.   Secondary hyperparathyroidism: With hypocalcemia and hyperphosphatemia.  On PhosLo.  Started calcitriol 0.5 mcg daily.  Ca and phos much improved. Decrease phosloContinue to monitor  Subjective:    Denies f/c/n/v/sob    Objective:   BP 114/67 (BP Location: Right Arm)   Pulse 72   Temp 97.7 F (36.5 C) (Oral)   Resp 16   Ht 5\' 9"  (1.753 m)   Wt 73.7 kg Comment: bed  SpO2 96%   BMI 23.99 kg/m   Intake/Output Summary (Last 24 hours) at 06/19/2022 1213 Last data filed at 06/19/2022 1027 Gross per 24 hour  Intake 1382.28 ml  Output 800 ml  Net 582.28 ml   Weight change:   Physical Exam: ZOX:WRUEA appearing CVS: irregular Resp: clear Abd: soft Ext: no LE edema ACCESS: TDC, Lt FAL with good bruit  Imaging: ECHO TEE  Result Date: 06/19/2022    TRANSESOPHOGEAL ECHO REPORT   Patient Name:   Glenn Olson Date of Exam: 06/19/2022 Medical Rec #:  [id number]      Height:       69.0 in Accession #:    [id number]     Weight:       162.5 lb Date of Birth:  September 06, 1940      BSA:          1.891 m Patient Age:    82 years        BP:           132/118 mmHg Patient Gender: M              HR:           76 bpm. Exam Location:  Inpatient Procedure: Transesophageal Echo, Cardiac Doppler and Color Doppler Indications:     I48.91* Unspeicified atrial fibrillation  History:         Patient has prior history of Echocardiogram examinations, most                  recent 06/05/2022. Abnormal ECG, Arrythmias:Atrial Fibrillation                  and Atrial Flutter; Signs/Symptoms:Chest Pain.  Sonographer:     Sheralyn Boatman RDCS Referring Phys:  [id number] Jonita Albee Diagnosing Phys: Lennie Odor MD PROCEDURE: After discussion of the risks and benefits of a TEE, an informed consent was obtained from the patient. TEE procedure time was 10 minutes. The transesophogeal probe was passed without difficulty through the esophogus of the patient. Imaged were obtained with the patient in a left lateral decubitus position. Sedation performed by  different physician. The patient was monitored while under deep sedation. Anesthestetic sedation was provided intravenously by Anesthesiology: 150mg  of Propofol, 60mg  of Lidocaine. Image quality was excellent. The patient's vital signs; including heart rate, blood pressure, and oxygen saturation; remained stable throughout the procedure. The patient developed no complications during the procedure. A successful direct current cardioversion was performed at 200 joules with 1 attempt.  IMPRESSIONS  1. Left ventricular ejection fraction, by estimation, is 50 to 55%. The left ventricle has low normal function. The left ventricle has no regional wall motion abnormalities.  2. Right ventricular systolic function is normal. The right ventricular size is normal.  3. Left atrial size was mild to moderately dilated. No left atrial/left atrial appendage thrombus was detected. The LAA emptying velocity was 39 cm/s.  4. Right atrial size was mildly dilated.  5. The mitral valve is grossly normal. Mild mitral valve regurgitation. No  evidence of mitral stenosis.  6. Tricuspid valve regurgitation is mild to moderate.  7. The aortic valve is tricuspid. Aortic valve regurgitation is mild. No aortic stenosis is present.  8. 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 Conclusion(s)/Recommendation(s): No LA/LAA thrombus identified. Successful cardioversion performed with restoration of normal sinus rhythm. FINDINGS  Left Ventricle: Left ventricular ejection fraction, by estimation, is 50 to 55%. The left ventricle has low normal function. The left ventricle has no regional wall motion abnormalities. The left ventricular internal cavity size was normal in size. Right Ventricle: The right ventricular size is normal. No increase in right ventricular wall thickness. Right ventricular systolic function is normal. Left Atrium: Left atrial size was mild to moderately dilated. No left atrial/left atrial appendage thrombus was detected. The LAA emptying velocity was 39 cm/s. Right Atrium: Right atrial size was mildly dilated. Pericardium: There is no evidence of pericardial effusion. Mitral Valve: The mitral valve is grossly normal. Mild mitral valve regurgitation. No evidence of mitral valve stenosis. Tricuspid Valve: The tricuspid valve is normal in structure. Tricuspid valve regurgitation is mild to moderate. Aortic Valve: The aortic valve is tricuspid. Aortic valve regurgitation is mild. No aortic stenosis is present. Pulmonic Valve: The pulmonic valve was grossly normal. Pulmonic valve regurgitation is not visualized. No evidence of pulmonic stenosis. Aorta: The aortic root and ascending aorta are structurally normal, with no evidence of dilitation. There is severe (Grade IV) layered plaque involving the descending aorta. Venous: The left upper pulmonary vein is normal. IAS/Shunts: No atrial level shunt detected by color flow Doppler. Additional Comments: Spectral Doppler performed.  AORTA Ao Root diam: 3.57 cm Lennie Odor  MD Electronically signed by Lennie Odor MD Signature Date/Time: 06/19/2022/10:23:07 AM    Final    EP STUDY  Result Date: 06/19/2022 See surgical note for result.   Labs: BMET Recent Labs  Lab 06/13/22 0156 06/15/22 0855 06/19/22 0843  NA 134* 135 136  K 5.1 4.7 4.3  CL 95* 99 98  CO2 24 25  --   GLUCOSE 98 90 93  BUN 52* 55* 56*  CREATININE 4.93* 5.91* 5.30*  CALCIUM 8.4* 8.6*  --   PHOS 3.7 4.9*  --    CBC Recent Labs  Lab 06/13/22 0156 06/14/22 0154 06/15/22 0202 06/19/22 0843  WBC 6.5 6.6 6.5  --   HGB 7.6* 8.1* 8.0* 8.5*  HCT 25.1* 26.2* 26.0* 25.0*  MCV 95.8 96.3 95.6  --   PLT 204 241 231  --     Medications:     sodium chloride  Intravenous Once   apixaban  2.5 mg Oral BID   calcitRIOL  0.5 mcg Oral Daily   calcium acetate  667 mg Oral TID WC   Chlorhexidine Gluconate Cloth  6 each Topical Q0600   darbepoetin (ARANESP) injection - DIALYSIS  100 mcg Subcutaneous Q Fri-1800   diltiazem  300 mg Oral Daily   feeding supplement (NEPRO CARB STEADY)  237 mL Oral BID BM   loratadine  10 mg Oral Daily

## 2022-06-19 NOTE — Progress Notes (Signed)
  Echocardiogram Echocardiogram Transesophageal has been performed.  Janalyn Harder 06/19/2022, 9:19 AM

## 2022-06-19 NOTE — Progress Notes (Signed)
PROGRESS NOTE Glenn Olson  ZOX:[id number] DOB: February 04, 1940 DOA: 05/30/2022 PCP: Benita Stabile, MD  Brief Narrative/Hospital Course: 82 y.o. male with medical history significant for ESRD, neurogenic atonic bladder presented to [facility] with headache and chest pain for 1 week.  Patient was on hemodialysis but had stopped going for hemodialysis for 1 year now.  Has been having poor oral intake with nausea and vomiting.  In the ED, patient was mildly tachypneic and tachycardic with elevated blood pressure.  Chest x-ray with increased left lower lower lobe markings. CO2 less than 7.  BUN of 241.  Potassium 4.5.  Cr 13.8.Troponin 48 > 52.  Nephrology was consulted and decision was to proceed with hemodialysis.  Patient was then transferred to [facility].   Patient has had HD cath placed and started on hemodialysis.  Encephalopathy improving. Hospital course complicated by A-fib with RVR.  He was started on p.o. Cardizem and IV heparin.  TTE without significant finding.  RVR improved. Transitioned to p.o. Cardizem CD, Eliquis and signed off by cardiology. Patient was treated for anemia of CKD, hypertension, secondary hyperparathyroidism with hypocalcemia and hyperphosphatemia.Remains weak and deconditioned with debility PT OT recommending CIR and awaiting for placement Ongoing uncontrolled A-fib with RVR cardiology reconsulted 5/17 Cardizem dose increased, underwent TEE DC cardioversion 5/21 and in sinus rhythm.  Awaiting for CIR admission.     Subjective: Patient seen and examined Overnight afebrile BP stable S/p TEE/DCCV this am Remains in normal sinus rhythm, asymptomatic  Assessment and Plan: Principal Problem:   Uremia Active Problems:   Metabolic acidosis   Chest pain   Atonic neurogenic bladder   ESRD on hemodialysis (HCC)   Chronic indwelling Foley catheter   Hypokalemia   Hypomagnesemia   Atrial flutter (HCC)   Persistent atrial fibrillation (HCC)   Malnutrition of  moderate degree  New onset a flutter/A-fib with RVR:TTE unremarkable, heart rate remained poorly controlled despite increasing Cardizem and cardiology was reconsulted 5/17 and s/p TEE/DCC, in nsr.Continue on Eliquis, CARDIZEM 300 mg daily and monitor in tele as per cardiology  Acute uremic encephalopathy 2/2 ESRD after being off HD for a year: Mental status at baseline, cont hd  ESRD: Nephrology following,last HD 5/20. Chest [facility] in place, but using his left AVG for HD, so likely pull out [facility] soon. Secondary hyperparathyroidism with hypocalcium and hyperphosphatemia> continue PhosLo,>started on  Calcitrol 0.5 mcg daily> calcium and phos much better,decreasing PhosLo plan per nephrology. Hypokalemia/hypocalcemia/hypomagnesemia/Hyperkalemia- labs stable as below. Recent Labs  Lab 06/13/22 0156 06/15/22 0855 06/19/22 0843  K 5.1 4.7 4.3  CALCIUM 8.4* 8.6*  --   MG 1.9  --   --   PHOS 3.7 4.9*  --   Atonic neurogenic bladder: No issues, cont SP catheter and catheter care by RN. Anemia of chronic kidney disease hb stable holding mid 8 gm as below, monitor. s/p 1 unit prbc 06/01/22. Recent Labs  Lab 06/13/22 0156 06/14/22 0154 06/15/22 0202 06/19/22 0843  HGB 7.6* 8.1* 8.0* 8.5*  HCT 25.1* 26.2* 26.0* 25.0*  Deconditioning, debility:Continue with PT OT awaiting for CIR once a fib better.     Thrombocytopenia: Resolved.  Moderate malnutrition RD following augment diet as below Nutrition Problem: Moderate Malnutrition Etiology: chronic illness (ESRD) Signs/Symptoms: moderate fat depletion, moderate muscle depletion, severe muscle depletion Interventions: Magic cup, MVI, Carnation Instant Breakfast  DVT prophylaxis: apixaban (ELIQUIS) tablet 2.5 mg Start: 06/09/22 1700 SCDs Start: 05/30/22 1926 Code Status:   Code Status: DNR Family Communication: plan of  care discussed with patient at bedside. Patient status is:  inpatient because of awaiting CIR once heart rate is optimized Level of  care: Telemetry Cardiac   Dispo: The patient is from: home            Anticipated disposition: CIR once accepted   Objective: Vitals last 24 hrs: Vitals:   06/19/22 0930 06/19/22 0931 06/19/22 0932 06/19/22 1012  BP:    114/67  Pulse: 73 73 72   Resp: (!) 25 (!) 23 (!) 28 16  Temp:    97.7 F (36.5 C)  TempSrc:    Oral  SpO2: 97% 96% 96% 96%  Weight:      Height:       Weight change:   Physical Examination: General exam: AAox3, weak,older appearing HEENT:Oral mucosa moist, Ear/Nose WNL grossly, dentition normal. Respiratory system: bilaterally clear BS, no use of accessory muscle Cardiovascular system: S1 & S2 +, M0 Gastrointestinal system: Abdomen soft, NT,ND,BS+ Nervous System:Alert, awake, moving extremities and grossly nonfocal Extremities: LE ankle edema neg, lower extremities warm Skin: No rashes,no icterus. MSK: Normal muscle bulk,tone, power  Chest TDC+ SP CATH  Medications reviewed:  Scheduled Meds:  sodium chloride   Intravenous Once   apixaban  2.5 mg Oral BID   calcitRIOL  0.5 mcg Oral Daily   calcium acetate  667 mg Oral TID WC   Chlorhexidine Gluconate Cloth  6 each Topical Q0600   darbepoetin (ARANESP) injection - DIALYSIS  100 mcg Subcutaneous Q Fri-1800   diltiazem  300 mg Oral Daily   feeding supplement (NEPRO CARB STEADY)  237 mL Oral BID BM   loratadine  10 mg Oral Daily  Continuous Infusions:  Diet Order             Diet Heart Room service appropriate? Yes with Assist; Fluid consistency: Thin  Diet effective now                  Intake/Output Summary (Last 24 hours) at 06/19/2022 1157 Last data filed at 06/19/2022 1027 Gross per 24 hour  Intake 1382.28 ml  Output 800 ml  Net 582.28 ml   Net IO Since Admission: -11,971.72 mL [06/19/22 1157]  Wt Readings from Last 3 Encounters:  06/18/22 73.7 kg  03/17/21 80.3 kg  02/22/21 77.6 kg   Unresulted Labs (From admission, onward)    None     Data Reviewed: I have personally reviewed  following labs and imaging studies CBC: Recent Labs  Lab 06/13/22 0156 06/14/22 0154 06/15/22 0202 06/19/22 0843  WBC 6.5 6.6 6.5  --   HGB 7.6* 8.1* 8.0* 8.5*  HCT 25.1* 26.2* 26.0* 25.0*  MCV 95.8 96.3 95.6  --   PLT 204 241 231  --   Basic Metabolic Panel: Recent Labs  Lab 06/13/22 0156 06/15/22 0855 06/19/22 0843  NA 134* 135 136  K 5.1 4.7 4.3  CL 95* 99 98  CO2 24 25  --   GLUCOSE 98 90 93  BUN 52* 55* 56*  CREATININE 4.93* 5.91* 5.30*  CALCIUM 8.4* 8.6*  --   MG 1.9  --   --   PHOS 3.7 4.9*  --    Recent Labs  Lab 06/13/22 0156 06/15/22 0855  ALBUMIN 2.8* 2.8*   No results found for this or any previous visit (from the past 240 hour(s)).   Antimicrobials: Anti-infectives (From admission, onward)    Start     Dose/Rate Route Frequency Ordered Stop   05/31/22 1115  ceFAZolin (ANCEF) IVPB 2g/100 mL premix        2 g 200 mL/hr over 30 Minutes Intravenous  Once 05/31/22 1108 05/31/22 1200   05/31/22 1112  ceFAZolin (ANCEF) 2-4 GM/100ML-% IVPB       Note to Pharmacy: Shanda Bumps M: cabinet override      05/31/22 1112 05/31/22 1206      Culture/Microbiology    Component Value Date/Time   SDES  11/29/2020 1705    URINE, CLEAN CATCH Performed at [facility], [address] [place], [place] [address]    [facility]  11/29/2020 1705    NONE Performed at [facility], [address] [place], [place] [address]    CULT MULTIPLE SPECIES PRESENT, SUGGEST RECOLLECTION (A) 11/29/2020 1705   REPTSTATUS 12/01/2020 FINAL 11/29/2020 1705  Radiology Studies: ECHO TEE  Result Date: 06/19/2022    TRANSESOPHOGEAL ECHO REPORT   Patient Name:   AKHIL EDELL Date of Exam: 06/19/2022 Medical Rec #:  [id number]      Height:       69.0 in Accession #:    [id number]     Weight:       162.5 lb Date of Birth:  September 22, 1940      BSA:          1.891 m Patient Age:    81 years       BP:           132/118 mmHg Patient Gender: M              HR:           76 bpm. Exam Location:   Inpatient Procedure: Transesophageal Echo, Cardiac Doppler and Color Doppler Indications:     I48.91* Unspeicified atrial fibrillation  History:         Patient has prior history of Echocardiogram examinations, most                  recent 06/05/2022. Abnormal ECG, Arrythmias:Atrial Fibrillation                  and Atrial Flutter; Signs/Symptoms:Chest Pain.  Sonographer:     Sheralyn Boatman RDCS Referring Phys:  [id number] Jonita Albee Diagnosing Phys: Lennie Odor MD PROCEDURE: After discussion of the risks and benefits of a TEE, an informed consent was obtained from the patient. TEE procedure time was 10 minutes. The transesophogeal probe was passed without difficulty through the esophogus of the patient. Imaged were obtained with the patient in a left lateral decubitus position. Sedation performed by different physician. The patient was monitored while under deep sedation. Anesthestetic sedation was provided intravenously by Anesthesiology: 150mg  of Propofol, 60mg  of Lidocaine. Image quality was excellent. The patient's vital signs; including heart rate, blood pressure, and oxygen saturation; remained stable throughout the procedure. The patient developed no complications during the procedure. A successful direct current cardioversion was performed at 200 joules with 1 attempt.  IMPRESSIONS  1. Left ventricular ejection fraction, by estimation, is 50 to 55%. The left ventricle has low normal function. The left ventricle has no regional wall motion abnormalities.  2. Right ventricular systolic function is normal. The right ventricular size is normal.  3. Left atrial size was mild to moderately dilated. No left atrial/left atrial appendage thrombus was detected. The LAA emptying velocity was 39 cm/s.  4. Right atrial size was mildly dilated.  5. The mitral valve is grossly normal. Mild mitral valve regurgitation. No evidence of mitral stenosis.  6. Tricuspid valve  regurgitation is mild to moderate.  7. The aortic  valve is tricuspid. Aortic valve regurgitation is mild. No aortic stenosis is present.  8. 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 Conclusion(s)/Recommendation(s): No LA/LAA thrombus identified. Successful cardioversion performed with restoration of normal sinus rhythm. FINDINGS  Left Ventricle: Left ventricular ejection fraction, by estimation, is 50 to 55%. The left ventricle has low normal function. The left ventricle has no regional wall motion abnormalities. The left ventricular internal cavity size was normal in size. Right Ventricle: The right ventricular size is normal. No increase in right ventricular wall thickness. Right ventricular systolic function is normal. Left Atrium: Left atrial size was mild to moderately dilated. No left atrial/left atrial appendage thrombus was detected. The LAA emptying velocity was 39 cm/s. Right Atrium: Right atrial size was mildly dilated. Pericardium: There is no evidence of pericardial effusion. Mitral Valve: The mitral valve is grossly normal. Mild mitral valve regurgitation. No evidence of mitral valve stenosis. Tricuspid Valve: The tricuspid valve is normal in structure. Tricuspid valve regurgitation is mild to moderate. Aortic Valve: The aortic valve is tricuspid. Aortic valve regurgitation is mild. No aortic stenosis is present. Pulmonic Valve: The pulmonic valve was grossly normal. Pulmonic valve regurgitation is not visualized. No evidence of pulmonic stenosis. Aorta: The aortic root and ascending aorta are structurally normal, with no evidence of dilitation. There is severe (Grade IV) layered plaque involving the descending aorta. Venous: The left upper pulmonary vein is normal. IAS/Shunts: No atrial level shunt detected by color flow Doppler. Additional Comments: Spectral Doppler performed.  AORTA Ao Root diam: 3.57 cm Lennie Odor MD Electronically signed by Lennie Odor MD Signature Date/Time: 06/19/2022/10:23:07 AM    Final     EP STUDY  Result Date: 06/19/2022 See surgical note for result.    LOS: 20 days   Lanae Boast, MD [facility]  06/19/2022, 11:57 AM

## 2022-06-19 NOTE — Transfer of Care (Signed)
Immediate Anesthesia Transfer of Care Note  Patient: Glenn Olson  Procedure(s) Performed: TRANSESOPHAGEAL ECHOCARDIOGRAM CARDIOVERSION  Patient Location: Cath Lab  Anesthesia Type:MAC  Level of Consciousness: drowsy and patient cooperative  Airway & Oxygen Therapy: Patient Spontanous Breathing and Patient connected to nasal cannula oxygen  Post-op Assessment: Report given to RN and Post -op Vital signs reviewed and stable  Post vital signs: Reviewed and stable  Last Vitals:  Vitals Value Taken Time  BP    Temp    Pulse 74 06/19/22 0919  Resp 29 06/19/22 0919  SpO2 94 % 06/19/22 0919  Vitals shown include unvalidated device data.  Last Pain:  Vitals:   06/19/22 0748  TempSrc: Temporal  PainSc: 0-No pain         Complications: No notable events documented.

## 2022-06-19 NOTE — CV Procedure (Signed)
   TRANSESOPHAGEAL ECHOCARDIOGRAM GUIDED DIRECT CURRENT CARDIOVERSION  NAME:  Glenn Olson    MRN: [id number] DOB:  1940/08/07    ADMIT DATE: 05/30/2022  INDICATIONS: Symptomatic atrial flutter  PROCEDURE:   Informed consent was obtained prior to the procedure. The risks, benefits and alternatives for the procedure were discussed and the patient comprehended these risks.  Risks include, but are not limited to, cough, sore throat, vomiting, nausea, somnolence, esophageal and stomach trauma or perforation, bleeding, low blood pressure, aspiration, pneumonia, infection, trauma to the teeth and death.    After a procedural time-out, the oropharynx was anesthetized and the patient was sedated by the anesthesia service. The transesophageal probe was inserted in the esophagus and stomach without difficulty and multiple views were obtained. Anesthesia was monitored by Dr. Maple Hudson.   COMPLICATIONS:    Complications: No complications Patient tolerated procedure well.  KEY FINDINGS:  No LAA thrombus.  Normal LVEF/RVEF.  No significant valvular heart disease.  Full Report to follow.   CARDIOVERSION:     Indications:  Symptomatic Atrial Flutter  Procedure Details:  Once the TEE was complete, the patient had the defibrillator pads placed in the anterior and posterior position. Once an appropriate level of sedation was confirmed, the patient was cardioverted x 1 with 200J of biphasic synchronized energy.  The patient converted to NSR.  There were no apparent complications.  The patient had normal neuro status and respiratory status post procedure with vitals stable as recorded elsewhere.  Adequate airway was maintained throughout and vital signs monitored per protocol.  Gerri Spore T. Flora Lipps, MD [facility]  [address] [place], [place] [address] [phone]  9:13 AM

## 2022-06-19 NOTE — Anesthesia Procedure Notes (Signed)
Procedure Name: MAC Date/Time: 06/19/2022 8:55 AM  Performed by: Audie Pinto, CRNAPre-anesthesia Checklist: Patient identified, Emergency Drugs available, Suction available and Patient being monitored Patient Re-evaluated:Patient Re-evaluated prior to induction Oxygen Delivery Method: Nasal cannula Induction Type: IV induction Placement Confirmation: positive ETCO2 Dental Injury: Teeth and Oropharynx as per pre-operative assessment

## 2022-06-19 NOTE — Progress Notes (Signed)
Inpatient Rehab Admissions Coordinator:   Cardioversion and TEE supposed to be completed this AM.  Will follow for stability for potential CIR admission.   Estill Dooms, PT, DPT Admissions Coordinator [phone] 06/19/22  10:52 AM

## 2022-06-19 NOTE — Progress Notes (Signed)
Physical Therapy Treatment Patient Details Name: Glenn Olson MRN: [id number] DOB: 08-31-1940 Today's Date: 06/19/2022   History of Present Illness Glenn Olson is a 82 y.o. male admitted 5/1  presented to hospital with headache and chest pain for 1 week.  Patient was on hemodialysis but had stopped going for hemodialysis for 1 year now.  Has been having poor oral intake with nausea and vomiting.  In the ED, patient was mildly tachypneic and tachycardic with elevated blood pressure as well as hypokalemia.  Chest x-ray with increased left lower lower lobe markings. HD reinitiated. Afib with RVR developed 5/9, transferred to cardiac progressive. PMH: ESRD, neurogenic atonic bladder with suprapubic catheter.    PT Comments    Pt received in supine, pleasantly agreeable to therapy session and with good participation and improved tolerance for transfers/gait progression in hallway with RW. Pt c/o fatigue at end of session but agreeable to sit up in chair, RN/NT in room to assist with CHG bath. Pt with suprapubic catheter but appears to also be urinating in toilet, RN notified. Pt continues to benefit from PT services to progress toward functional mobility goals.    Recommendations for follow up therapy are one component of a multi-disciplinary discharge planning process, led by the attending physician.  Recommendations may be updated based on patient status, additional functional criteria and insurance authorization.  Follow Up Recommendations  Can patient physically be transported by private vehicle: Yes    Assistance Recommended at Discharge Frequent or constant Supervision/Assistance  Patient can return home with the following A lot of help with walking and/or transfers;A lot of help with bathing/dressing/bathroom;Assistance with cooking/housework;Assist for transportation;Help with stairs or ramp for entrance   Equipment Recommendations  None recommended by PT    Recommendations for Other  Services       Precautions / Restrictions Precautions Precautions: Fall Precaution Comments: watch HR; suprapubic catheter Restrictions Weight Bearing Restrictions: No     Mobility  Bed Mobility Overal bed mobility: Needs Assistance Bed Mobility: Supine to Sit     Supine to sit: Supervision     General bed mobility comments: Pt needs cues to reach for bed rail to assist with raising his trunk, difficulty sequencing prior to cues being given.    Transfers Overall transfer level: Needs assistance Equipment used: Rolling walker (2 wheels) Transfers: Sit to/from Stand Sit to Stand: Min guard           General transfer comment: vc for one hand on bed surface to power up    Ambulation/Gait Ambulation/Gait assistance: Min guard Gait Distance (Feet): 170 Feet Assistive device: Rolling walker (2 wheels) Gait Pattern/deviations: Step-through pattern, Decreased stride length, Trunk flexed Gait velocity: decr     General Gait Details: x1 standing rest break, fair use of RW, min postural cues and frequent check-ins regarding energy conservation/activity pacing.      Balance Overall balance assessment: Mild deficits observed, not formally tested                                          Cognition Arousal/Alertness: Awake/alert Behavior During Therapy: WFL for tasks assessed/performed Overall Cognitive Status: No family/caregiver present to determine baseline cognitive functioning Area of Impairment: Memory, Problem solving                     Memory: Decreased recall of precautions, Decreased short-term memory Following Commands: Follows  one step commands consistently Safety/Judgement: Decreased awareness of safety, Decreased awareness of deficits Awareness: Emergent Problem Solving: Difficulty sequencing, Requires verbal cues General Comments: Pt unable to indicate to PTA how long he has had suprapubic catheter or whether he has been making  urine (urinating through urethra in addition to through the catheter) or if this is new for him. RN notified pt appears to have urinated on bed pad and in toilet during session.        Exercises      General Comments General comments (skin integrity, edema, etc.): pt with apparent urination from urethra on bed pad and in toilet, RN notified (pt also has suprapubic catheter). VSS on RA, HR between 100-110 bpm with exertion. NT entering room at end of session to give him a CHG bath, pt up in chair with NT/RN in room at end of session.      Pertinent Vitals/Pain Pain Assessment Pain Assessment: No/denies pain Pain Intervention(s): Monitored during session     PT Goals (current goals can now be found in the care plan section) Acute Rehab PT Goals Patient Stated Goal: rehab, then home PT Goal Formulation: With patient/family Time For Goal Achievement: 06/17/22 Progress towards PT goals: Progressing toward goals    Frequency    Min 3X/week      PT Plan Current plan remains appropriate       AM-PAC PT "6 Clicks" Mobility   Outcome Measure  Help needed turning from your back to your side while in a flat bed without using bedrails?: None Help needed moving from lying on your back to sitting on the side of a flat bed without using bedrails?: A Little Help needed moving to and from a bed to a chair (including a wheelchair)?: A Little Help needed standing up from a chair using your arms (e.g., wheelchair or bedside chair)?: A Little Help needed to walk in hospital room?: A Little Help needed climbing 3-5 steps with a railing? : A Lot 6 Click Score: 18    End of Session Equipment Utilized During Treatment: Gait belt Activity Tolerance: Patient tolerated treatment well Patient left: with call bell/phone within reach;in chair;with nursing/sitter in room;Other (comment) (RN/NT in room, PTA asked RN to place chair alarm as they were still getting him washed up/may need to stand  again) Nurse Communication: Mobility status;Other (comment) (urinating in toilet but does have catheter.) PT Visit Diagnosis: Unsteadiness on feet (R26.81);Muscle weakness (generalized) (M62.81)     Time: 1610-9604 PT Time Calculation (min) (ACUTE ONLY): 15 min  Charges:  $Gait Training: 8-22 mins                     Hermine Feria P., PTA Acute Rehabilitation Services Secure Chat Preferred 9a-5:30pm Office: [phone]    Dorathy [facility] 06/19/2022, 3:45 PM

## 2022-06-20 ENCOUNTER — Other Ambulatory Visit: Payer: Self-pay

## 2022-06-20 ENCOUNTER — Ambulatory Visit (HOSPITAL_COMMUNITY)
Admission: RE | Admit: 2022-06-20 | Discharge: 2022-06-20 | Disposition: A | Discharge status: Home / Self Care | Payer: Medicare Other | Source: Ambulatory Visit | Attending: Nephrology | Admitting: Nephrology

## 2022-06-20 ENCOUNTER — Encounter (HOSPITAL_COMMUNITY): Payer: Self-pay | Admitting: Physical Medicine and Rehabilitation

## 2022-06-20 ENCOUNTER — Inpatient Hospital Stay (HOSPITAL_COMMUNITY)
Admission: RE | Admit: 2022-06-20 | Discharge: 2022-06-28 | DRG: 945 | Disposition: A | Discharge status: Home Health | Payer: Medicare Other | Source: Intra-hospital | Attending: Physical Medicine and Rehabilitation | Admitting: Physical Medicine and Rehabilitation

## 2022-06-20 DIAGNOSIS — R5381 Other malaise: Secondary | ICD-10-CM | POA: Diagnosis present

## 2022-06-20 DIAGNOSIS — N319 Neuromuscular dysfunction of bladder, unspecified: Secondary | ICD-10-CM | POA: Diagnosis present

## 2022-06-20 DIAGNOSIS — H9193 Unspecified hearing loss, bilateral: Secondary | ICD-10-CM | POA: Diagnosis present

## 2022-06-20 DIAGNOSIS — I120 Hypertensive chronic kidney disease with stage 5 chronic kidney disease or end stage renal disease: Secondary | ICD-10-CM | POA: Diagnosis present

## 2022-06-20 DIAGNOSIS — Z79899 Other long term (current) drug therapy: Secondary | ICD-10-CM

## 2022-06-20 DIAGNOSIS — Z992 Dependence on renal dialysis: Secondary | ICD-10-CM

## 2022-06-20 DIAGNOSIS — I4891 Unspecified atrial fibrillation: Secondary | ICD-10-CM | POA: Diagnosis present

## 2022-06-20 DIAGNOSIS — E440 Moderate protein-calorie malnutrition: Secondary | ICD-10-CM | POA: Diagnosis present

## 2022-06-20 DIAGNOSIS — N186 End stage renal disease: Secondary | ICD-10-CM

## 2022-06-20 DIAGNOSIS — Z6824 Body mass index (BMI) 24.0-24.9, adult: Secondary | ICD-10-CM

## 2022-06-20 DIAGNOSIS — N312 Flaccid neuropathic bladder, not elsewhere classified: Secondary | ICD-10-CM | POA: Diagnosis present

## 2022-06-20 DIAGNOSIS — N19 Unspecified kidney failure: Secondary | ICD-10-CM | POA: Diagnosis not present

## 2022-06-20 DIAGNOSIS — K219 Gastro-esophageal reflux disease without esophagitis: Secondary | ICD-10-CM | POA: Diagnosis present

## 2022-06-20 DIAGNOSIS — N2581 Secondary hyperparathyroidism of renal origin: Secondary | ICD-10-CM | POA: Diagnosis present

## 2022-06-20 DIAGNOSIS — M898X9 Other specified disorders of bone, unspecified site: Secondary | ICD-10-CM | POA: Diagnosis present

## 2022-06-20 DIAGNOSIS — E559 Vitamin D deficiency, unspecified: Secondary | ICD-10-CM | POA: Diagnosis present

## 2022-06-20 DIAGNOSIS — R079 Chest pain, unspecified: Secondary | ICD-10-CM | POA: Diagnosis not present

## 2022-06-20 DIAGNOSIS — Z978 Presence of other specified devices: Secondary | ICD-10-CM

## 2022-06-20 DIAGNOSIS — D509 Iron deficiency anemia, unspecified: Secondary | ICD-10-CM | POA: Diagnosis present

## 2022-06-20 DIAGNOSIS — D631 Anemia in chronic kidney disease: Secondary | ICD-10-CM | POA: Diagnosis present

## 2022-06-20 DIAGNOSIS — R7989 Other specified abnormal findings of blood chemistry: Secondary | ICD-10-CM | POA: Diagnosis not present

## 2022-06-20 DIAGNOSIS — G4700 Insomnia, unspecified: Secondary | ICD-10-CM | POA: Diagnosis present

## 2022-06-20 DIAGNOSIS — Z66 Do not resuscitate: Secondary | ICD-10-CM | POA: Diagnosis present

## 2022-06-20 DIAGNOSIS — I959 Hypotension, unspecified: Secondary | ICD-10-CM | POA: Diagnosis not present

## 2022-06-20 DIAGNOSIS — Z4901 Encounter for fitting and adjustment of extracorporeal dialysis catheter: Secondary | ICD-10-CM | POA: Diagnosis not present

## 2022-06-20 HISTORY — PX: IR REMOVAL TUN CV CATH W/O FL: IMG2289

## 2022-06-20 LAB — RENAL FUNCTION PANEL
Albumin: 3.1 g/dL — ABNORMAL LOW (ref 3.5–5.0)
Anion gap: 15 (ref 5–15)
BUN: 84 mg/dL — ABNORMAL HIGH (ref 8–23)
CO2: 20 mmol/L — ABNORMAL LOW (ref 22–32)
Calcium: 7.7 mg/dL — ABNORMAL LOW (ref 8.9–10.3)
Chloride: 98 mmol/L (ref 98–111)
Creatinine, Ser: 6.42 mg/dL — ABNORMAL HIGH (ref 0.61–1.24)
GFR, Estimated: 8 mL/min — ABNORMAL LOW (ref >60)
Glucose, Bld: 94 mg/dL (ref 70–99)
Phosphorus: 6.9 mg/dL — ABNORMAL HIGH (ref 2.5–4.6)
Potassium: 4.5 mmol/L (ref 3.5–5.1)
Sodium: 133 mmol/L — ABNORMAL LOW (ref 135–145)

## 2022-06-20 LAB — CBC
HCT: 24.5 % — ABNORMAL LOW (ref 39.0–52.0)
Hemoglobin: 7.6 g/dL — ABNORMAL LOW (ref 13.0–17.0)
MCH: 30 pg (ref 26.0–34.0)
MCHC: 31 g/dL (ref 30.0–36.0)
MCV: 96.8 fL (ref 80.0–100.0)
Platelets: 260 10*3/uL (ref 150–400)
RBC: 2.53 MIL/uL — ABNORMAL LOW (ref 4.22–5.81)
RDW: 15.2 % (ref 11.5–15.5)
WBC: 5.8 10*3/uL (ref 4.0–10.5)
nRBC: 0 % (ref 0.0–0.2)

## 2022-06-20 MED ORDER — NEPRO/CARBSTEADY PO LIQD
237.0000 mL | Freq: Two times a day (BID) | ORAL | Status: DC
  Administered 2022-06-22: 237 mL via ORAL

## 2022-06-20 MED ORDER — GUAIFENESIN-DM 100-10 MG/5ML PO SYRP: 5.0000 mL | ORAL_SOLUTION | Freq: Four times a day (QID) | ORAL | Status: DC | PRN

## 2022-06-20 MED ORDER — MELATONIN 3 MG PO TABS: 3.0000 mg | ORAL_TABLET | Freq: Every evening | ORAL | 0 refills | Status: DC | PRN

## 2022-06-20 MED ORDER — MELATONIN 3 MG PO TABS
3.0000 mg | ORAL_TABLET | Freq: Every evening | ORAL | Status: DC | PRN
  Administered 2022-06-20 – 2022-06-27 (×6): 3 mg via ORAL
  Filled 2022-06-20 (×6): qty 1

## 2022-06-20 MED ORDER — LIDOCAINE HCL 1 % IJ SOLN
INTRAMUSCULAR | Status: AC
  Filled 2022-06-20: qty 20

## 2022-06-20 MED ORDER — LIDOCAINE HCL 1 % IJ SOLN: 20.0000 mL | Freq: Once | INTRAMUSCULAR | Status: DC

## 2022-06-20 MED ORDER — NEPRO/CARBSTEADY PO LIQD: 237.0000 mL | Freq: Two times a day (BID) | ORAL | 0 refills | Status: DC

## 2022-06-20 MED ORDER — CALCIUM ACETATE (PHOS BINDER) 667 MG PO CAPS
667.0000 mg | ORAL_CAPSULE | Freq: Three times a day (TID) | ORAL | Status: DC
  Administered 2022-06-20 – 2022-06-28 (×21): 667 mg via ORAL
  Filled 2022-06-20 (×22): qty 1

## 2022-06-20 MED ORDER — CALCITRIOL 0.5 MCG PO CAPS
0.5000 ug | ORAL_CAPSULE | Freq: Every day | ORAL | Status: DC
  Administered 2022-06-21 – 2022-06-28 (×8): 0.5 ug via ORAL
  Filled 2022-06-20 (×9): qty 1

## 2022-06-20 MED ORDER — LORATADINE 10 MG PO TABS
10.0000 mg | ORAL_TABLET | Freq: Every day | ORAL | Status: DC
  Administered 2022-06-21 – 2022-06-28 (×8): 10 mg via ORAL
  Filled 2022-06-20 (×8): qty 1

## 2022-06-20 MED ORDER — APIXABAN 2.5 MG PO TABS: 2.5000 mg | ORAL_TABLET | Freq: Two times a day (BID) | ORAL | Status: DC

## 2022-06-20 MED ORDER — PROCHLORPERAZINE EDISYLATE 10 MG/2ML IJ SOLN: 5.0000 mg | Freq: Four times a day (QID) | INTRAMUSCULAR | Status: DC | PRN

## 2022-06-20 MED ORDER — DILTIAZEM HCL 30 MG PO TABS: 30.0000 mg | ORAL_TABLET | Freq: Four times a day (QID) | ORAL | Status: DC | PRN

## 2022-06-20 MED ORDER — DILTIAZEM HCL ER COATED BEADS 300 MG PO CP24
300.0000 mg | ORAL_CAPSULE | Freq: Every day | ORAL | Status: DC
  Administered 2022-06-21 – 2022-06-28 (×8): 300 mg via ORAL
  Filled 2022-06-20 (×9): qty 1

## 2022-06-20 MED ORDER — DILTIAZEM HCL ER COATED BEADS 300 MG PO CP24: 300.0000 mg | ORAL_CAPSULE | Freq: Every day | ORAL | Status: DC

## 2022-06-20 MED ORDER — DARBEPOETIN ALFA 100 MCG/0.5ML IJ SOSY
100.0000 ug | PREFILLED_SYRINGE | INTRAMUSCULAR | Status: DC
  Administered 2022-06-22: 100 ug via SUBCUTANEOUS
  Filled 2022-06-20: qty 0.5

## 2022-06-20 MED ORDER — CALCITRIOL 0.5 MCG PO CAPS: 0.5000 ug | ORAL_CAPSULE | Freq: Every day | ORAL | Status: DC

## 2022-06-20 MED ORDER — CHLORHEXIDINE GLUCONATE CLOTH 2 % EX PADS
6.0000 | MEDICATED_PAD | Freq: Every day | CUTANEOUS | Status: DC
  Administered 2022-06-21 – 2022-06-24 (×4): 6 via TOPICAL

## 2022-06-20 MED ORDER — DIPHENHYDRAMINE HCL 25 MG PO CAPS
25.0000 mg | ORAL_CAPSULE | Freq: Four times a day (QID) | ORAL | Status: DC | PRN
  Administered 2022-06-26: 25 mg via ORAL
  Filled 2022-06-20: qty 1

## 2022-06-20 MED ORDER — POLYETHYLENE GLYCOL 3350 17 G PO PACK: 17.0000 g | PACK | Freq: Every day | ORAL | Status: DC | PRN

## 2022-06-20 MED ORDER — CALCIUM ACETATE (PHOS BINDER) 667 MG PO CAPS: 667.0000 mg | ORAL_CAPSULE | Freq: Three times a day (TID) | ORAL | Status: DC

## 2022-06-20 MED ORDER — ACETAMINOPHEN 325 MG PO TABS
325.0000 mg | ORAL_TABLET | ORAL | Status: DC | PRN
  Administered 2022-06-27: 650 mg via ORAL
  Filled 2022-06-20: qty 2

## 2022-06-20 MED ORDER — PROCHLORPERAZINE 25 MG RE SUPP: 12.5000 mg | Freq: Four times a day (QID) | RECTAL | Status: DC | PRN

## 2022-06-20 MED ORDER — BISACODYL 10 MG RE SUPP: 10.0000 mg | Freq: Every day | RECTAL | Status: DC | PRN

## 2022-06-20 MED ORDER — PROCHLORPERAZINE MALEATE 5 MG PO TABS: 5.0000 mg | ORAL_TABLET | Freq: Four times a day (QID) | ORAL | Status: DC | PRN

## 2022-06-20 MED ORDER — APIXABAN 2.5 MG PO TABS
2.5000 mg | ORAL_TABLET | Freq: Two times a day (BID) | ORAL | Status: DC
  Administered 2022-06-20 – 2022-06-28 (×16): 2.5 mg via ORAL
  Filled 2022-06-20 (×16): qty 1

## 2022-06-20 NOTE — Progress Notes (Signed)
PMR Admission Coordinator Pre-Admission Assessment   Patient: Glenn Olson is an 82 y.o., male MRN: [id number] DOB: 1940-04-03 Height: 5\' 9"  (175.3 cm) Weight: 74.3 kg (bed)   Insurance Information HMO:     PPO:      PCP:      IPA:      80/20:      OTHER:  PRIMARY: medicare a/b      Policy#: [id number]       Subscriber: pt CM Name:       Phone#:      Fax#:  Pre-Cert#: verified Health and safety inspector:  Benefits:  Phone #:      Name:  Eff. Date:  A/B 06/30/06     Deduct: $1632      Out of Pocket Max: n/a      Life Max: n/a CIR: 100%      SNF: 20 full days Outpatient: 80%     Co-Pay: 20% Home Health: 100%      Co-Pay:  DME: 80%     Co-Pay: 20% Providers:  SECONDARY: Tricare for Life      Policy#: [id number]     Phone#: [phone]   Financial Counselor:       Phone#:    The "Data Collection Information Summary" for patients in Inpatient Rehabilitation Facilities with attached "Privacy Act Statement-Health Care Records" was provided and verbally reviewed with: Patient and Family   Emergency Contact Information Contact Information       Name Relation Home Work Mobile    [name],[name] Son     [phone]           Current Medical History  Patient Admitting Diagnosis: debility, new AF RVR, new HD   History of Present Illness: Pt is an 82 y/o male with PMH ESRD, neurogenic bladder with SPC, admitted to [facility] on 05/30/22 with c/o headache and chest pain x1 week with associated DOE.  Pt had been on HD but had stopped going about 1 yr prior to admit.  In ED HR 118-121, RR 21-30, SBP 120s-170s, O2 sats 100% on room air.  BUN 241, Creatinine 13.8, Troponin 48>52.  He was started on sodium bicarb and IV fluids for hydration.  Chest xray showed increased left lower love markings.  Nephrology was consulted and pt was restarted on HD, MWF schedule.  Hospital course complicated by new onset afib with RVR.  Pt initially started on cardizem with fair rate control, ultimately required DCCV.   TTE/TEE completed and unrevealing.  Therapy ongoing and pt has been recommended for CIR.     Patient's medical record from [facility] has been reviewed by the rehabilitation admission coordinator and physician.   Past Medical History      Past Medical History:  Diagnosis Date   Anemia     Chronic kidney disease     Dysrhythmia     GERD (gastroesophageal reflux disease)     Neurogenic bladder        Has the patient had major surgery during 100 days prior to admission? Yes   Family History   family history is not on file.   Current Medications   Current Facility-Administered Medications:    0.9 %  sodium chloride infusion (Manually program via Guardrails IV Fluids), , Intravenous, Once, Gonfa, Taye T, MD   acetaminophen (TYLENOL) tablet 650 mg, 650 mg, Oral, Q6H PRN, 650 mg at 06/01/22 0237 **OR** acetaminophen (TYLENOL) suppository 650 mg, 650 mg, Rectal, Q6H PRN, Alanda Slim, Taye  T, MD   apixaban (ELIQUIS) tablet 2.5 mg, 2.5 mg, Oral, BID, Kendal Hymen, RPH, 2.5 mg at 06/19/22 2228   calcitRIOL (ROCALTROL) capsule 0.5 mcg, 0.5 mcg, Oral, Daily, Alanda Slim, Taye T, MD, 0.5 mcg at 06/20/22 0749   calcium acetate (PHOSLO) capsule 667 mg, 667 mg, Oral, TID WC, Darnell Level, MD, 667 mg at 06/19/22 1722   Chlorhexidine Gluconate Cloth 2 % PADS 6 each, 6 each, Topical, Q0600, Candelaria Stagers T, MD, 6 each at 06/18/22 1946   Darbepoetin Alfa (ARANESP) injection 100 mcg, 100 mcg, Subcutaneous, Q Fri-1800, Darnell Level, MD, 100 mcg at 06/15/22 1740   diltiazem (CARDIZEM CD) 24 hr capsule 300 mg, 300 mg, Oral, Daily, Jonita Albee, PA-C, 300 mg at 06/19/22 1420   diltiazem (CARDIZEM) tablet 30 mg, 30 mg, Oral, QID PRN, Corky Crafts, MD, 30 mg at 06/17/22 1831   feeding supplement (NEPRO CARB STEADY) liquid 237 mL, 237 mL, Oral, BID BM, Gonfa, Taye T, MD, 237 mL at 06/19/22 1421   guaiFENesin-dextromethorphan (ROBITUSSIN DM) 100-10 MG/5ML syrup 5 mL, 5 mL, Oral, Q4H PRN, Alanda Slim,  Taye T, MD, 5 mL at 06/10/22 2205   HYDROmorphone (DILAUDID) injection 0.5 mg, 0.5 mg, Intravenous, Q4H PRN, Alanda Slim, Taye T, MD, 0.5 mg at 06/01/22 1647   labetalol (NORMODYNE) injection 10 mg, 10 mg, Intravenous, Q2H PRN, Alanda Slim, Taye T, MD, 10 mg at 05/30/22 2045   loratadine (CLARITIN) tablet 10 mg, 10 mg, Oral, Daily, Gonfa, Taye T, MD, 10 mg at 06/19/22 1022   melatonin tablet 3 mg, 3 mg, Oral, QHS PRN, Alanda Slim, Taye T, MD, 3 mg at 06/08/22 0030   ondansetron (ZOFRAN) injection 4 mg, 4 mg, Intravenous, Q6H PRN, Alanda Slim, Taye T, MD, 4 mg at 06/08/22 0753   Oral care mouth rinse, 15 mL, Mouth Rinse, PRN, Gonfa, Taye T, MD   polyethylene glycol (MIRALAX / GLYCOLAX) packet 17 g, 17 g, Oral, Daily PRN, Almon Hercules, MD   Patients Current Diet:  Diet Order                  Diet Heart Room service appropriate? Yes with Assist; Fluid consistency: Thin  Diet effective now                         Precautions / Restrictions Precautions Precautions: Fall Precaution Comments: watch HR; suprapubic catheter Restrictions Weight Bearing Restrictions: No    Has the patient had 2 or more falls or a fall with injury in the past year? No   Prior Activity Level Limited Community (1-2x/wk): independent prior to admit, not driving, no DME   Prior Functional Level Self Care: Did the patient need help bathing, dressing, using the toilet or eating? Independent   Indoor Mobility: Did the patient need assistance with walking from room to room (with or without device)? Independent   Stairs: Did the patient need assistance with internal or external stairs (with or without device)? Independent   Functional Cognition: Did the patient need help planning regular tasks such as shopping or remembering to take medications? Independent   Patient Information Are you of Hispanic, Latino/a,or Spanish origin?: A. No, not of Hispanic, Latino/a, or Spanish origin What is your race?: A. White Do you need or want an  interpreter to communicate with a doctor or health care staff?: 0. No   Patient's Response To:  Health Literacy and Transportation Is the patient able to respond to health literacy  and transportation needs?: Yes Health Literacy - How often do you need to have someone help you when you read instructions, pamphlets, or other written material from your doctor or pharmacy?: Never In the past 12 months, has lack of transportation kept you from medical appointments or from getting medications?: No In the past 12 months, has lack of transportation kept you from meetings, work, or from getting things needed for daily living?: No   Home Assistive Devices / Equipment Home Assistive Devices/Equipment: Dentures (specify type), Eyeglasses, Scales, Walker (specify type), Grab bars around toilet, Grab bars in shower Home Equipment: Agricultural consultant (2 wheels), [employer] - single point, Shower seat   Prior Device Use: Indicate devices/aids used by the patient prior to current illness, exacerbation or injury?  Occasional RW   Current Functional Level Cognition   Overall Cognitive Status: No family/caregiver present to determine baseline cognitive functioning Current Attention Level: Selective Orientation Level: Oriented X4 Following Commands: Follows one step commands consistently Safety/Judgement: Decreased awareness of safety, Decreased awareness of deficits General Comments: Pt unable to indicate to PTA how long he has had suprapubic catheter or whether he has been making urine (urinating through urethra in addition to through the catheter) or if this is new for him. RN notified pt appears to have urinated on bed pad and in toilet during session.    Extremity Assessment (includes Sensation/Coordination)   Upper Extremity Assessment: Generalized weakness  Lower Extremity Assessment: Defer to PT evaluation     ADLs   Overall ADL's : Needs assistance/impaired Eating/Feeding: Independent, Bed level Grooming:  Set up, Sitting Upper Body Bathing: Minimal assistance, Sitting Lower Body Bathing: Sitting/lateral leans, Moderate assistance Upper Body Dressing : Minimal assistance, Sitting Lower Body Dressing: Moderate assistance, Sit to/from stand, Maximal assistance Toilet Transfer: Moderate assistance, +2 for physical assistance, +2 for safety/equipment, Stand-pivot, Rolling walker (2 wheels), BSC/3in1 Toileting- Clothing Manipulation and Hygiene: Maximal assistance, +2 for physical assistance, +2 for safety/equipment, Sit to/from stand Functional mobility during ADLs: Moderate assistance, +2 for physical assistance, +2 for safety/equipment, Rolling walker (2 wheels) General ADL Comments: limited by afib     Mobility   Overal bed mobility: Needs Assistance Bed Mobility: Supine to Sit Supine to sit: Supervision Sit to supine: Supervision General bed mobility comments: Pt needs cues to reach for bed rail to assist with raising his trunk, difficulty sequencing prior to cues being given.     Transfers   Overall transfer level: Needs assistance Equipment used: Rolling walker (2 wheels) Transfers: Sit to/from Stand Sit to Stand: Min guard General transfer comment: vc for one hand on bed surface to power up     Ambulation / Gait / Stairs / Wheelchair Mobility   Ambulation/Gait Ambulation/Gait assistance: Land (Feet): 170 Feet Assistive device: Rolling walker (2 wheels) Gait Pattern/deviations: Step-through pattern, Decreased stride length, Trunk flexed General Gait Details: x1 standing rest break, fair use of RW, min postural cues and frequent check-ins regarding energy conservation/activity pacing. Gait velocity: decr Gait velocity interpretation: <1.8 ft/sec, indicate of risk for recurrent falls     Posture / Balance Balance Overall balance assessment: Mild deficits observed, not formally tested Sitting-balance support: Feet supported Sitting balance-Leahy Scale:  Good Standing balance support: Reliant on assistive device for balance Standing balance-Leahy Scale: Poor Standing balance comment: relies on UE support on RW     Special needs/care consideration Dialysis: Hemodialysis Monday, Wednesday, and Friday    Previous Home Environment (from acute therapy documentation) Living Arrangements: Children Available Help at  Discharge: Family, Available PRN/intermittently Type of Home: House Bathroom Shower/Tub: Engineer, manufacturing systems: Standard Home Care Services: No Additional Comments: gleaned from chart   Discharge Living Setting Plans for Discharge Living Setting: Lives with (comment) (son and daughter in law) Type of Home at Discharge: House Discharge Home Layout: One level Discharge Home Access: Level entry Discharge Bathroom Shower/Tub:  (remodeling to walk-in) Discharge Bathroom Toilet: Handicapped height Discharge Bathroom Accessibility: Yes How Accessible: Accessible via wheelchair Does the patient have any problems obtaining your medications?: No   Social/Family/Support Systems Anticipated Caregiver: son, Alfonse Flavors. Anticipated Caregiver's Contact Information: [phone] Ability/Limitations of Caregiver: none specified Caregiver Availability: 24/7 (between son/daughter in law/hired caregivers if needed) Discharge Plan Discussed with Primary Caregiver: Yes Is Caregiver In Agreement with Plan?: Yes Does Caregiver/Family have Issues with Lodging/Transportation while Pt is in Rehab?: No   Goals Patient/Family Goal for Rehab: PT/OT supervision to mod I Expected length of stay: 7-10 days Additional Information: Discharge plan: home with son and daughter in law to their home (as prior to admit) Pt/Family Agrees to Admission and willing to participate: Yes Program Orientation Provided & Reviewed with Pt/Caregiver Including Roles  & Responsibilities: Yes  Barriers to Discharge: Insurance for SNF coverage   Decrease burden of Care  through IP rehab admission: n/a   Possible need for SNF placement upon discharge: Not anticipated.  Pt from home with son/daughter in law and will return to this set up.  Son/daughter in law can provide expected level of care, and will hire caregivers if needed.    Patient Condition: I have reviewed medical records from [facility], spoken with CM, and patient and son. I met with patient at the bedside and discussed via phone for inpatient rehabilitation assessment.  Patient will benefit from ongoing PT and OT, can actively participate in 3 hours of therapy a day 5 days of the week, and can make measurable gains during the admission.  Patient will also benefit from the coordinated team approach during an Inpatient Acute Rehabilitation admission.  The patient will receive intensive therapy as well as Rehabilitation physician, nursing, social worker, and care management interventions.  Due to bladder management, safety, skin/wound care, disease management, medication administration, pain management, and patient education the patient requires 24 hour a day rehabilitation nursing.  The patient is currently min assist with mobility and basic ADLs.  Discharge setting and therapy post discharge at home with home health is anticipated.  Patient has agreed to participate in the Acute Inpatient Rehabilitation Program and will admit today.   Preadmission Screen Completed By:  Stephania Fragmin, PT, DPT 06/20/2022 9:50 AM ______________________________________________________________________   Discussed status with Dr. Carlis Abbott on 06/20/22  at 10:19 AM  and received approval for admission today.   Admission Coordinator:  Stephania Fragmin, PT, DPT time 10:19 AM Dorna Bloom 06/20/22     Assessment/Plan: Diagnosis: Debility Does the need for close, 24 hr/day Medical supervision in concert with the patient's rehab needs make it unreasonable for this patient to be served in a less intensive setting? Yes Co-Morbidities requiring  supervision/potential complications: ESRD, neurogenic bladder with SPC, headache, chest pain, DOE Due to bladder management, bowel management, safety, skin/wound care, disease management, medication administration, pain management, and patient education, does the patient require 24 hr/day rehab nursing? Yes Does the patient require coordinated care of a physician, rehab nurse, PT, OT, and SLP to address physical and functional deficits in the context of the above medical diagnosis(es)? Yes Addressing deficits in the following areas:  balance, endurance, locomotion, strength, transferring, bowel/bladder control, bathing, dressing, feeding, grooming, toileting, cognition, and psychosocial support Can the patient actively participate in an intensive therapy program of at least 3 hrs of therapy 5 days a week? Yes The potential for patient to make measurable gains while on inpatient rehab is excellent Anticipated functional outcomes upon discharge from inpatient rehab: supervision PT, supervision OT, supervision SLP Estimated rehab length of stay to reach the above functional goals is: 5-7 days Anticipated discharge destination: Home 10. Overall Rehab/Functional Prognosis: excellent     MD Signature: Sula Soda, MD

## 2022-06-20 NOTE — Procedures (Signed)
Successful removal of right IJ tunneled HD catheter.   After obtaining consent and performing a time-out, the right upper chest was prepped and draped in the normal sterile fashion. The heparin was removed from both ports. Using gentle manual traction the cuff of the catheter was exposed and the catheter was removed in its entirety. Pressure was held until hemostasis was obtained. A sterile dressing was applied. The patient tolerated the procedure well with no immediate complications.   Alwyn Ren, [place] [phone] 06/20/2022, 3:47 PM

## 2022-06-20 NOTE — Progress Notes (Signed)
Patient off the unit at this time with shadow chart in IR.    Alcario Drought Naomi Castrogiovanni, LPN

## 2022-06-20 NOTE — Progress Notes (Signed)
Update provided to [facility] admissions and [facility] regarding pt's d/c to CIR today. Will assist as needed.   Olivia Canter Renal Navigator [phone]

## 2022-06-20 NOTE — PMR Pre-admission (Signed)
PMR Admission Coordinator Pre-Admission Assessment   Patient: Glenn Olson is an 82 y.o., male MRN: [id number] DOB: 11/10/1940 Height: 5' 9" (175.3 cm) Weight: 74.3 kg (bed)   Insurance Information HMO:     PPO:      PCP:      IPA:      80/20:      OTHER:  PRIMARY: medicare a/b      Policy#: [id number]       Subscriber: pt CM Name:       Phone#:      Fax#:  Pre-Cert#: verified online      Employer:  Benefits:  Phone #:      Name:  Eff. Date:  A/B 06/30/06     Deduct: $1632      Out of Pocket Max: n/a      Life Max: n/a CIR: 100%      SNF: 20 full days Outpatient: 80%     Co-Pay: 20% Home Health: 100%      Co-Pay:  DME: 80%     Co-Pay: 20% Providers:  SECONDARY: Tricare for Life      Policy#: [id number]     Phone#: [phone]   Financial Counselor:       Phone#:    The "Data Collection Information Summary" for patients in Inpatient Rehabilitation Facilities with attached "Privacy Act Statement-Health Care Records" was provided and verbally reviewed with: Patient and Family   Emergency Contact Information Contact Information       Name Relation Home Work Mobile    Glenn Olson Son     [phone]           Current Medical History  Patient Admitting Diagnosis: debility, new AF RVR, new HD   History of Present Illness: Pt is an 82 y/o male with PMH ESRD, neurogenic bladder with SPC, admitted to [facility] on 05/30/22 with c/o headache and chest pain x1 week with associated DOE.  Pt had been on HD but had stopped going about 1 yr prior to admit.  In ED HR 118-121, RR 21-30, SBP 120s-170s, O2 sats 100% on room air.  BUN 241, Creatinine 13.8, Troponin 48>52.  He was started on sodium bicarb and IV fluids for hydration.  Chest xray showed increased left lower love markings.  Nephrology was consulted and pt was restarted on HD, MWF schedule.  Hospital course complicated by new onset afib with RVR.  Pt initially started on cardizem with fair rate control, ultimately required DCCV.   TTE/TEE completed and unrevealing.  Therapy ongoing and pt has been recommended for CIR.     Patient's medical record from [place] has been reviewed by the rehabilitation admission coordinator and physician.   Past Medical History      Past Medical History:  Diagnosis Date   Anemia     Chronic kidney disease     Dysrhythmia     GERD (gastroesophageal reflux disease)     Neurogenic bladder        Has the patient had major surgery during 100 days prior to admission? Yes   Family History   family history is not on file.   Current Medications   Current Facility-Administered Medications:    0.9 %  sodium chloride infusion (Manually program via Guardrails IV Fluids), , Intravenous, Once, Gonfa, Taye T, MD   acetaminophen (TYLENOL) tablet 650 mg, 650 mg, Oral, Q6H PRN, 650 mg at 06/01/22 0237 **OR** acetaminophen (TYLENOL) suppository 650 mg, 650 mg, Rectal, Q6H PRN, Gonfa, Taye   T, MD   apixaban (ELIQUIS) tablet 2.5 mg, 2.5 mg, Oral, BID, Hurth, Kimberly P, RPH, 2.5 mg at 06/19/22 2228   calcitRIOL (ROCALTROL) capsule 0.5 mcg, 0.5 mcg, Oral, Daily, Gonfa, Taye T, MD, 0.5 mcg at 06/20/22 0749   calcium acetate (PHOSLO) capsule 667 mg, 667 mg, Oral, TID WC, Peeples, Samuel J, MD, 667 mg at 06/19/22 1722   Chlorhexidine Gluconate Cloth 2 % PADS 6 each, 6 each, Topical, Q0600, Gonfa, Taye T, MD, 6 each at 06/18/22 1946   Darbepoetin Alfa (ARANESP) injection 100 mcg, 100 mcg, Subcutaneous, Q Fri-1800, Peeples, Samuel J, MD, 100 mcg at 06/15/22 1740   diltiazem (CARDIZEM CD) 24 hr capsule 300 mg, 300 mg, Oral, Daily, Johnson, Kathleen R, PA-C, 300 mg at 06/19/22 1420   diltiazem (CARDIZEM) tablet 30 mg, 30 mg, Oral, QID PRN, Varanasi, Jayadeep S, MD, 30 mg at 06/17/22 1831   feeding supplement (NEPRO CARB STEADY) liquid 237 mL, 237 mL, Oral, BID BM, Gonfa, Taye T, MD, 237 mL at 06/19/22 1421   guaiFENesin-dextromethorphan (ROBITUSSIN DM) 100-10 MG/5ML syrup 5 mL, 5 mL, Oral, Q4H PRN, Gonfa,  Taye T, MD, 5 mL at 06/10/22 2205   HYDROmorphone (DILAUDID) injection 0.5 mg, 0.5 mg, Intravenous, Q4H PRN, Gonfa, Taye T, MD, 0.5 mg at 06/01/22 1647   labetalol (NORMODYNE) injection 10 mg, 10 mg, Intravenous, Q2H PRN, Gonfa, Taye T, MD, 10 mg at 05/30/22 2045   loratadine (CLARITIN) tablet 10 mg, 10 mg, Oral, Daily, Gonfa, Taye T, MD, 10 mg at 06/19/22 1022   melatonin tablet 3 mg, 3 mg, Oral, QHS PRN, Gonfa, Taye T, MD, 3 mg at 06/08/22 0030   ondansetron (ZOFRAN) injection 4 mg, 4 mg, Intravenous, Q6H PRN, Gonfa, Taye T, MD, 4 mg at 06/08/22 0753   Oral care mouth rinse, 15 mL, Mouth Rinse, PRN, Gonfa, Taye T, MD   polyethylene glycol (MIRALAX / GLYCOLAX) packet 17 g, 17 g, Oral, Daily PRN, Gonfa, Taye T, MD   Patients Current Diet:  Diet Order                  Diet Heart Room service appropriate? Yes with Assist; Fluid consistency: Thin  Diet effective now                         Precautions / Restrictions Precautions Precautions: Fall Precaution Comments: watch HR; suprapubic catheter Restrictions Weight Bearing Restrictions: No    Has the patient had 2 or more falls or a fall with injury in the past year? No   Prior Activity Level Limited Community (1-2x/wk): independent prior to admit, not driving, no DME   Prior Functional Level Self Care: Did the patient need help bathing, dressing, using the toilet or eating? Independent   Indoor Mobility: Did the patient need assistance with walking from room to room (with or without device)? Independent   Stairs: Did the patient need assistance with internal or external stairs (with or without device)? Independent   Functional Cognition: Did the patient need help planning regular tasks such as shopping or remembering to take medications? Independent   Patient Information Are you of Hispanic, Latino/a,or Spanish origin?: A. No, not of Hispanic, Latino/a, or Spanish origin What is your race?: A. White Do you need or want an  interpreter to communicate with a doctor or health care staff?: 0. No   Patient's Response To:  Health Literacy and Transportation Is the patient able to respond to health literacy   and transportation needs?: Yes Health Literacy - How often do you need to have someone help you when you read instructions, pamphlets, or other written material from your doctor or pharmacy?: Never In the past 12 months, has lack of transportation kept you from medical appointments or from getting medications?: No In the past 12 months, has lack of transportation kept you from meetings, work, or from getting things needed for daily living?: No   Home Assistive Devices / Equipment Home Assistive Devices/Equipment: Dentures (specify type), Eyeglasses, Scales, Walker (specify type), Grab bars around toilet, Grab bars in shower Home Equipment: Rolling Walker (2 wheels), Cane - single point, Shower seat   Prior Device Use: Indicate devices/aids used by the patient prior to current illness, exacerbation or injury?  Occasional RW   Current Functional Level Cognition   Overall Cognitive Status: No family/caregiver present to determine baseline cognitive functioning Current Attention Level: Selective Orientation Level: Oriented X4 Following Commands: Follows one step commands consistently Safety/Judgement: Decreased awareness of safety, Decreased awareness of deficits General Comments: Pt unable to indicate to PTA how long he has had suprapubic catheter or whether he has been making urine (urinating through urethra in addition to through the catheter) or if this is new for him. RN notified pt appears to have urinated on bed pad and in toilet during session.    Extremity Assessment (includes Sensation/Coordination)   Upper Extremity Assessment: Generalized weakness  Lower Extremity Assessment: Defer to PT evaluation     ADLs   Overall ADL's : Needs assistance/impaired Eating/Feeding: Independent, Bed level Grooming:  Set up, Sitting Upper Body Bathing: Minimal assistance, Sitting Lower Body Bathing: Sitting/lateral leans, Moderate assistance Upper Body Dressing : Minimal assistance, Sitting Lower Body Dressing: Moderate assistance, Sit to/from stand, Maximal assistance Toilet Transfer: Moderate assistance, +2 for physical assistance, +2 for safety/equipment, Stand-pivot, Rolling walker (2 wheels), BSC/3in1 Toileting- Clothing Manipulation and Hygiene: Maximal assistance, +2 for physical assistance, +2 for safety/equipment, Sit to/from stand Functional mobility during ADLs: Moderate assistance, +2 for physical assistance, +2 for safety/equipment, Rolling walker (2 wheels) General ADL Comments: limited by afib     Mobility   Overal bed mobility: Needs Assistance Bed Mobility: Supine to Sit Supine to sit: Supervision Sit to supine: Supervision General bed mobility comments: Pt needs cues to reach for bed rail to assist with raising his trunk, difficulty sequencing prior to cues being given.     Transfers   Overall transfer level: Needs assistance Equipment used: Rolling walker (2 wheels) Transfers: Sit to/from Stand Sit to Stand: Min guard General transfer comment: vc for one hand on bed surface to power up     Ambulation / Gait / Stairs / Wheelchair Mobility   Ambulation/Gait Ambulation/Gait assistance: Min guard Gait Distance (Feet): 170 Feet Assistive device: Rolling walker (2 wheels) Gait Pattern/deviations: Step-through pattern, Decreased stride length, Trunk flexed General Gait Details: x1 standing rest break, fair use of RW, min postural cues and frequent check-ins regarding energy conservation/activity pacing. Gait velocity: decr Gait velocity interpretation: <1.8 ft/sec, indicate of risk for recurrent falls     Posture / Balance Balance Overall balance assessment: Mild deficits observed, not formally tested Sitting-balance support: Feet supported Sitting balance-Leahy Scale:  Good Standing balance support: Reliant on assistive device for balance Standing balance-Leahy Scale: Poor Standing balance comment: relies on UE support on RW     Special needs/care consideration Dialysis: Hemodialysis Monday, Wednesday, and Friday    Previous Home Environment (from acute therapy documentation) Living Arrangements: Children Available Help at   Discharge: Family, Available PRN/intermittently Type of Home: House Bathroom Shower/Tub: Tub/shower unit Bathroom Toilet: Standard Home Care Services: No Additional Comments: gleaned from chart   Discharge Living Setting Plans for Discharge Living Setting: Lives with (comment) (son and daughter in law) Type of Home at Discharge: House Discharge Home Layout: One level Discharge Home Access: Level entry Discharge Bathroom Shower/Tub:  (remodeling to walk-in) Discharge Bathroom Toilet: Handicapped height Discharge Bathroom Accessibility: Yes How Accessible: Accessible via wheelchair Does the patient have any problems obtaining your medications?: No   Social/Family/Support Systems Anticipated Caregiver: son, [name] Jr. Anticipated Caregiver's Contact Information: [phone] Ability/Limitations of Caregiver: none specified Caregiver Availability: 24/7 (between son/daughter in law/hired caregivers if needed) Discharge Plan Discussed with Primary Caregiver: Yes Is Caregiver In Agreement with Plan?: Yes Does Caregiver/Family have Issues with Lodging/Transportation while Pt is in Rehab?: No   Goals Patient/Family Goal for Rehab: PT/OT supervision to mod I Expected length of stay: 7-10 days Additional Information: Discharge plan: home with son and daughter in law to their home (as prior to admit) Pt/Family Agrees to Admission and willing to participate: Yes Program Orientation Provided & Reviewed with Pt/Caregiver Including Roles  & Responsibilities: Yes  Barriers to Discharge: Insurance for SNF coverage   Decrease burden of Care  through IP rehab admission: n/a   Possible need for SNF placement upon discharge: Not anticipated.  Pt from home with son/daughter in law and will return to this set up.  Son/daughter in law can provide expected level of care, and will hire caregivers if needed.    Patient Condition: I have reviewed medical records from [place], spoken with CM, and patient and son. I met with patient at the bedside and discussed via phone for inpatient rehabilitation assessment.  Patient will benefit from ongoing PT and OT, can actively participate in 3 hours of therapy a day 5 days of the week, and can make measurable gains during the admission.  Patient will also benefit from the coordinated team approach during an Inpatient Acute Rehabilitation admission.  The patient will receive intensive therapy as well as Rehabilitation physician, nursing, social worker, and care management interventions.  Due to bladder management, safety, skin/wound care, disease management, medication administration, pain management, and patient education the patient requires 24 hour a day rehabilitation nursing.  The patient is currently min assist with mobility and basic ADLs.  Discharge setting and therapy post discharge at home with home health is anticipated.  Patient has agreed to participate in the Acute Inpatient Rehabilitation Program and will admit today.   Preadmission Screen Completed By:  Caitlin E Warren, PT, DPT 06/20/2022 9:50 AM ______________________________________________________________________   Discussed status with Dr. Davionna Blacksher on 06/20/22  at 10:19 AM  and received approval for admission today.   Admission Coordinator:  Caitlin E Warren, PT, DPT time 10:19 AM /Date 06/20/22     Assessment/Plan: Diagnosis: Debility Does the need for close, 24 hr/day Medical supervision in concert with the patient's rehab needs make it unreasonable for this patient to be served in a less intensive setting? Yes Co-Morbidities requiring  supervision/potential complications: ESRD, neurogenic bladder with SPC, headache, chest pain, DOE Due to bladder management, bowel management, safety, skin/wound care, disease management, medication administration, pain management, and patient education, does the patient require 24 hr/day rehab nursing? Yes Does the patient require coordinated care of a physician, rehab nurse, PT, OT, and SLP to address physical and functional deficits in the context of the above medical diagnosis(es)? Yes Addressing deficits in the following areas:   balance, endurance, locomotion, strength, transferring, bowel/bladder control, bathing, dressing, feeding, grooming, toileting, cognition, and psychosocial support Can the patient actively participate in an intensive therapy program of at least 3 hrs of therapy 5 days a week? Yes The potential for patient to make measurable gains while on inpatient rehab is excellent Anticipated functional outcomes upon discharge from inpatient rehab: supervision PT, supervision OT, supervision SLP Estimated rehab length of stay to reach the above functional goals is: 5-7 days Anticipated discharge destination: Home 10. Overall Rehab/Functional Prognosis: excellent     MD Signature: Cashtyn Pouliot, MD 

## 2022-06-20 NOTE — Progress Notes (Signed)
[facility] Progress Note   Assessment/ Plan:   ESRD: History of such and was off dialysis for some time. Looked into PD but not an option given adhesions. Now ESRD again and agreeing to dialysis -Continue with dialysis MWF -Outpatient dialysis arranged at [facility] but waiting for formal discharge plan given possibly going to CIR -[facility] in place with [facility].  Appreciate help -used AVG 5/15, 5/17.    Tolerated HD using Lt FAL AVG on Mon Seen on HD 3K bath 2L goal UF 134/72 Lt FAL  Will request to have RIJ TC removed by VIR; graft has been used over 4x already successfully.  Has seat at [facility] TTS 610AM.   Atrial fibrillation with RVR: TEE guided DCCV 5/21 CV x1 200J biphasic synchronized -> NSR, on Eliquis   Anemia of CKD: Persistently low hemoglobin.  Iron depleted and IV iron as ordered.  Increase ESA due on Friday.   Hypertension: Blood pressure acceptable on current medications.   Secondary hyperparathyroidism: With hypocalcemia and hyperphosphatemia.  On PhosLo.  Started calcitriol 0.5 mcg daily.  Ca and phos much improved. Decrease phosloContinue to monitor  Subjective:    Denies f/c/n/v/sob    Objective:   BP 136/81 (BP Location: Right Arm)   Pulse 76   Temp 98.4 F (36.9 C) (Oral)   Resp 17   Ht 5\' 9"  (1.753 m)   Wt 74.3 kg Comment: bed  SpO2 98%   BMI 24.19 kg/m   Intake/Output Summary (Last 24 hours) at 06/20/2022 0930 Last data filed at 06/19/2022 1800 Gross per 24 hour  Intake 250 ml  Output 650 ml  Net -400 ml   Weight change:   Physical Exam: ZOX:[id number] appearing CVS: irregular Resp: clear Abd: soft Ext: no LE edema ACCESS: RIJ TDC, Lt FAL with good bruit  Imaging: ECHO TEE  Result Date: 06/19/2022    TRANSESOPHOGEAL ECHO REPORT   Patient Name:   Glenn Olson Date of Exam: 06/19/2022 Medical Rec #:  [id number]      Height:       69.0 in Accession #:    [id number]     Weight:       162.5 lb Date of Birth:  July 03, 1940      BSA:          1.891 m  Patient Age:    81 years       BP:           132/118 mmHg Patient Gender: M              HR:           76 bpm. Exam Location:  Inpatient Procedure: Transesophageal Echo, Cardiac Doppler and Color Doppler Indications:     I48.91* Unspeicified atrial fibrillation  History:         Patient has prior history of Echocardiogram examinations, most                  recent 06/05/2022. Abnormal ECG, Arrythmias:Atrial Fibrillation                  and Atrial Flutter; Signs/Symptoms:Chest Pain.  Sonographer:     Sheralyn Boatman RDCS Referring Phys:  [id number] Jonita Albee Diagnosing Phys: Lennie Odor MD PROCEDURE: After discussion of the risks and benefits of a TEE, an informed consent was obtained from the patient. TEE procedure time was 10 minutes. The transesophogeal probe was passed without difficulty through the esophogus of the patient. Imaged were obtained with  the patient in a left lateral decubitus position. Sedation performed by different physician. The patient was monitored while under deep sedation. Anesthestetic sedation was provided intravenously by Anesthesiology: 150mg  of Propofol, 60mg  of Lidocaine. Image quality was excellent. The patient's vital signs; including heart rate, blood pressure, and oxygen saturation; remained stable throughout the procedure. The patient developed no complications during the procedure. A successful direct current cardioversion was performed at 200 joules with 1 attempt.  IMPRESSIONS  1. Left ventricular ejection fraction, by estimation, is 50 to 55%. The left ventricle has low normal function. The left ventricle has no regional wall motion abnormalities.  2. Right ventricular systolic function is normal. The right ventricular size is normal.  3. Left atrial size was mild to moderately dilated. No left atrial/left atrial appendage thrombus was detected. The LAA emptying velocity was 39 cm/s.  4. Right atrial size was mildly dilated.  5. The mitral valve is grossly normal. Mild mitral  valve regurgitation. No evidence of mitral stenosis.  6. Tricuspid valve regurgitation is mild to moderate.  7. The aortic valve is tricuspid. Aortic valve regurgitation is mild. No aortic stenosis is present.  8. 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 Conclusion(s)/Recommendation(s): No LA/LAA thrombus identified. Successful cardioversion performed with restoration of normal sinus rhythm. FINDINGS  Left Ventricle: Left ventricular ejection fraction, by estimation, is 50 to 55%. The left ventricle has low normal function. The left ventricle has no regional wall motion abnormalities. The left ventricular internal cavity size was normal in size. Right Ventricle: The right ventricular size is normal. No increase in right ventricular wall thickness. Right ventricular systolic function is normal. Left Atrium: Left atrial size was mild to moderately dilated. No left atrial/left atrial appendage thrombus was detected. The LAA emptying velocity was 39 cm/s. Right Atrium: Right atrial size was mildly dilated. Pericardium: There is no evidence of pericardial effusion. Mitral Valve: The mitral valve is grossly normal. Mild mitral valve regurgitation. No evidence of mitral valve stenosis. Tricuspid Valve: The tricuspid valve is normal in structure. Tricuspid valve regurgitation is mild to moderate. Aortic Valve: The aortic valve is tricuspid. Aortic valve regurgitation is mild. No aortic stenosis is present. Pulmonic Valve: The pulmonic valve was grossly normal. Pulmonic valve regurgitation is not visualized. No evidence of pulmonic stenosis. Aorta: The aortic root and ascending aorta are structurally normal, with no evidence of dilitation. There is severe (Grade IV) layered plaque involving the descending aorta. Venous: The left upper pulmonary vein is normal. IAS/Shunts: No atrial level shunt detected by color flow Doppler. Additional Comments: Spectral Doppler performed.  AORTA Ao Root  diam: 3.57 cm Lennie Odor MD Electronically signed by Lennie Odor MD Signature Date/Time: 06/19/2022/10:23:07 AM    Final    EP STUDY  Result Date: 06/19/2022 See surgical note for result.   Labs: BMET Recent Labs  Lab 06/15/22 0855 06/19/22 0843 06/20/22 0753  NA 135 136 133*  K 4.7 4.3 4.5  CL 99 98 98  CO2 25  --  20*  GLUCOSE 90 93 94  BUN 55* 56* 84*  CREATININE 5.91* 5.30* 6.42*  CALCIUM 8.6*  --  7.7*  PHOS 4.9*  --  6.9*   CBC Recent Labs  Lab 06/14/22 0154 06/15/22 0202 06/19/22 0843 06/20/22 0753  WBC 6.6 6.5  --  5.8  HGB 8.1* 8.0* 8.5* 7.6*  HCT 26.2* 26.0* 25.0* 24.5*  MCV 96.3 95.6  --  96.8  PLT 241 231  --  260  Medications:     sodium chloride   Intravenous Once   apixaban  2.5 mg Oral BID   calcitRIOL  0.5 mcg Oral Daily   calcium acetate  667 mg Oral TID WC   Chlorhexidine Gluconate Cloth  6 each Topical Q0600   darbepoetin (ARANESP) injection - DIALYSIS  100 mcg Subcutaneous Q Fri-1800   diltiazem  300 mg Oral Daily   feeding supplement (NEPRO CARB STEADY)  237 mL Oral BID BM   loratadine  10 mg Oral Daily

## 2022-06-20 NOTE — Progress Notes (Signed)
Inpatient Rehabilitation Center Individual Statement of Services  Patient Name:  Othell Mcqueary  Date:  06/20/2022  Welcome to the Inpatient Rehabilitation Center.  Our goal is to provide you with an individualized program based on your diagnosis and situation, designed to meet your specific needs.  With this comprehensive rehabilitation program, you will be expected to participate in at least 3 hours of rehabilitation therapies Monday-Friday, with modified therapy programming on the weekends.  Your rehabilitation program will include the following services:  Physical Therapy (PT), Occupational Therapy (OT), Speech Therapy (ST), 24 hour per day rehabilitation nursing, Therapeutic Recreaction (TR), Neuropsychology, Care Coordinator, Rehabilitation Medicine, Nutrition Services, Pharmacy Services, and Other  Weekly team conferences will be held on Wednesdays to discuss your progress.  Your Inpatient Rehabilitation Care Coordinator will talk with you frequently to get your input and to update you on team discussions.  Team conferences with you and your family in attendance may also be held.  Expected length of stay:  7-10 Days  Overall anticipated outcome: supervision to mod I   Depending on your progress and recovery, your program may change. Your Inpatient Rehabilitation Care Coordinator will coordinate services and will keep you informed of any changes. Your Inpatient Rehabilitation Care Coordinator's name and contact numbers are listed  below.  The following services may also be recommended but are not provided by the Inpatient Rehabilitation Center:   Home Health Rehabiltiation Services Outpatient Rehabilitation Services    Arrangements will be made to provide these services after discharge if needed.  Arrangements include referral to agencies that provide these services.  Your insurance has been verified to be: Medicare A & B Your primary doctor is:  Nita Sells, MD  Pertinent information  will be shared with your doctor and your insurance company.  Inpatient Rehabilitation Care Coordinator:  Lavera Guise, [place] [phone] or [phone]  Information discussed with and copy given to patient by: Andria Rhein, 06/20/2022, 3:39 PM

## 2022-06-20 NOTE — Progress Notes (Addendum)
Arrived to the unit approximately at 1445. Patient oriented to unit and oriented to assigned room. Admission/Assessments complete.    Tilden Dome, LPN

## 2022-06-20 NOTE — H&P (Signed)
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     HPI:  Glenn Olson is a 82 year old male with history of ESRD w/anemia, neurogenic bladder s/p SPC who quit meds and HD about 14 months ago. He was admitted on 05/30/22 with one week history of headache, difficulty breathing with activity, low energy level, chest pressure as well as lack of urine production. Family also reported progressive decline in activity for a months with decrease in ambulation. He was found to be uremic with BUN 241, Hgb 8.6, Ca-6.0, CO2< 7,  trops 48, SCr- 13.8 and increased crowding of LLL markings. Dr. Ronalee Belts consulted and recommended fluid bolus, HD cath placement and sodium bicarb. CP pain felt to be due to marked uremia as EKG without ST changes and pain improved with morphine. He was transferred to [facility] and R-IJ placed by Dr. Verl Blalock. HD initiated and tolerance of procedure improving with decrease of fatigue and improvement in mentation. Marland Kitchen   Hospital course significant for onset of A fib with RVR with HR 110-160 on 05/07. He was started on IV heparin and po diltiazem for rate control. 2 D echo done revealing EF 55-60% with no wall abnormality, mild concentric LVH and moderately dilated LA. He was transitioned to [place]. He  had recurrent issues with flutter on 05/17 and due to difficulty in rate control; underwent DCCV to NSR on 05/21 by Dr. Flora Lipps.  Anemia of chronic disease treated with IV iron and aranesp added.  Therapy has been working with patient who was noted to have weakness with fatigue due to  debility. CIR was recommended due to recent functional decline.     PD not an option due to adhesions. Has a seat at [facility] @ 6:10 am. Currently tachycardic.    Review of Systems  Constitutional:  Negative for chills and fever.  HENT:  Negative for hearing loss.   Eyes:  Negative for blurred vision.   Respiratory:  Negative for cough.   Cardiovascular:  Negative for chest pain and leg swelling.  Gastrointestinal:  Negative for abdominal pain, constipation, heartburn and nausea.  Musculoskeletal:  Negative for back pain, joint pain, myalgias and neck pain.  Neurological:  Negative for dizziness and tingling.  Psychiatric/Behavioral:  The patient is not nervous/anxious and does not have insomnia.      Past Medical History:  Diagnosis Date   Anemia    Chronic kidney disease    Dysrhythmia    GERD (gastroesophageal reflux disease)    Neurogenic bladder     Past Surgical History:  Procedure Laterality Date   AV FISTULA PLACEMENT Left 09/27/2020   Procedure: LEFT ARM ARTERIOVENOUS GRAFT PLACEMENT USING GORE-TEX 4-7MM X 45CM  VASCULAR GRAFT;  Surgeon: Larina Earthly, MD;  Location: [facility];  Service: Vascular;  Laterality: Left;   CAPD INSERTION N/A 03/17/2021   Procedure: ATTEMPTED INSERTION OF LAPAROSCOPIC CONTINUOUS AMBULATORY PERITONEAL DIALYSIS (CAPD) CATHETER;  Surgeon: Maeola Harman, MD;  Location: [facility] OR;  Service: Vascular;  Laterality: N/A;   CARDIOVERSION N/A 06/19/2022   Procedure: CARDIOVERSION;  Surgeon: Sande Rives, MD;  Location: [facility] INVASIVE CV LAB;  Service: Cardiovascular;  Laterality: N/A;   CYSTOURETHROSCOPY     INSERTION OF DIALYSIS CATHETER Right 09/27/2020   Procedure: INSERTION OF PALINDROME TUNNELED DIALYSIS CATHETER 14.63f X 23CM;  Surgeon: Larina Earthly, MD;  Location: [facility];  Service:  Vascular;  Laterality: Right;   INSERTION OF DIALYSIS CATHETER Right 05/31/2022   Procedure: INSERTION OF DIALYSIS CATHETER;  Surgeon: Victorino Sparrow, MD;  Location: [facility] OR;  Service: Vascular;  Laterality: Right;   REMOVAL OF A DIALYSIS CATHETER N/A 12/06/2020   Procedure: MINOR REMOVAL OF A DIALYSIS CATHETER;  Surgeon: Larina Earthly, MD;  Location: [facility];  Service: Vascular;  Laterality: N/A;   SUPRAPUBIC CATHETER PLACEMENT     TEE WITHOUT CARDIOVERSION N/A  06/19/2022   Procedure: TRANSESOPHAGEAL ECHOCARDIOGRAM;  Surgeon: Sande Rives, MD;  Location: [facility] INVASIVE CV LAB;  Service: Cardiovascular;  Laterality: N/A;    No family history on file.   Social History: Lives with family and independent without AD. 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 He reports that he has never smoked. He has never used smokeless tobacco. He reports current alcohol use--months ago. He reports that he does not use drugs.   Allergies: No Known Allergies   Medications Prior to Admission  Medication Sig Dispense Refill   colchicine 0.6 MG tablet Take 0.6 mg by mouth daily as needed (gout/knee flare).      Home: Home Living Family/patient expects to be discharged to:: Skilled nursing facility Living Arrangements: Children Available Help at Discharge: Family, Available PRN/intermittently Type of Home: House Bathroom Shower/Tub: Engineer, manufacturing systems: Standard Home Equipment: Agricultural consultant (2 wheels), [employer] - single point, Environmental education officer Comments: gleaned from chart   Functional History: Prior Function Prior Level of Function : Independent/Modified Independent Mobility Comments: independent with RW  when he had pain per son ADLs Comments: Independent with ADLs   Functional Status:  Mobility: Bed Mobility Overal bed mobility: Needs Assistance Bed Mobility: Supine to Sit, Sit to Supine Supine to sit: Supervision Sit to supine: Supervision General bed mobility comments: cues for reail use Transfers Overall transfer level: Needs assistance Equipment used: Rolling walker (2 wheels) Transfers: Sit to/from Stand Sit to Stand: Min guard General transfer comment: cues for hand placement Ambulation/Gait Ambulation/Gait assistance: Min guard Gait Distance (Feet): 170 Feet Assistive device: Rolling walker (2 wheels) Gait Pattern/deviations: Step-through pattern, Decreased stride length, Trunk flexed General  Gait Details: x1 standing rest break, fair use of RW, min postural cues and frequent check-ins regarding energy conservation/activity pacing. Gait velocity: decr Gait velocity interpretation: <1.8 ft/sec, indicate of risk for recurrent falls   ADL: ADL Overall ADL's : Needs assistance/impaired Eating/Feeding: Independent, Bed level Grooming: Wash/dry hands, Wash/dry face, Brushing hair, Min guard, Standing Grooming Details (indicate cue type and reason): at sink Upper Body Bathing: Minimal assistance, Sitting Lower Body Bathing: Sitting/lateral leans, Moderate assistance Upper Body Dressing : Sitting, Minimal assistance Upper Body Dressing Details (indicate cue type and reason): donned gown to cover back Lower Body Dressing: Minimal assistance, Sitting/lateral leans Lower Body Dressing Details (indicate cue type and reason): to donn socks Toilet Transfer: Moderate assistance, +2 for physical assistance, +2 for safety/equipment, Stand-pivot, Rolling walker (2 wheels), BSC/3in1 Toileting- Clothing Manipulation and Hygiene: Maximal assistance, +2 for physical assistance, +2 for safety/equipment, Sit to/from stand Functional mobility during ADLs: Moderate assistance, +2 for physical assistance, +2 for safety/equipment, Rolling walker (2 wheels) General ADL Comments: limited by afib   Cognition: Cognition Overall Cognitive Status: No family/caregiver present to determine baseline cognitive functioning Orientation Level: Oriented X4 Cognition Arousal/Alertness: Awake/alert Behavior During Therapy: WFL for tasks assessed/performed Overall Cognitive Status: No family/caregiver present to determine baseline cognitive functioning Area of Impairment: Memory, Problem solving Current Attention Level: Selective Memory: Decreased recall  of precautions, Decreased short-term memory Following Commands: Follows one step commands consistently Safety/Judgement: Decreased awareness of safety, Decreased  awareness of deficits Awareness: Emergent Problem Solving: Difficulty sequencing, Requires verbal cues General Comments: followed directions well     There were no vitals taken for this visit. Physical Exam Vitals and nursing note reviewed.  Constitutional:  BMI 23.57    Appearance: He is well-developed.  Cardiovascular:     Rate and Rhythm: Tachycardia present. Irregular rhythm Skin:    General: Skin is warm and dry. RIJ TDC Neurological:     Mental Status: He is alert and oriented to person, place, and time. No focal deficits    Results for orders placed or performed during the hospital encounter of 05/30/22 (from the past 48 hour(s))  I-STAT, chem 8     Status: Abnormal   Collection Time: 06/19/22  8:43 AM  Result Value Ref Range   Sodium 136 135 - 145 mmol/L   Potassium 4.3 3.5 - 5.1 mmol/L   Chloride 98 98 - 111 mmol/L   BUN 56 (H) 8 - 23 mg/dL   Creatinine, Ser 9.81 (H) 0.61 - 1.24 mg/dL   Glucose, Bld 93 70 - 99 mg/dL    Comment: Glucose reference range applies only to samples taken after fasting for at least 8 hours.   Calcium, Ion 1.04 (L) 1.15 - 1.40 mmol/L   TCO2 26 22 - 32 mmol/L   Hemoglobin 8.5 (L) 13.0 - 17.0 g/dL   HCT 19.1 (L) 47.8 - 29.5 %  CBC     Status: Abnormal   Collection Time: 06/20/22  7:53 AM  Result Value Ref Range   WBC 5.8 4.0 - 10.5 K/uL   RBC 2.53 (L) 4.22 - 5.81 MIL/uL   Hemoglobin 7.6 (L) 13.0 - 17.0 g/dL   HCT 62.1 (L) 30.8 - 65.7 %   MCV 96.8 80.0 - 100.0 fL   MCH 30.0 26.0 - 34.0 pg   MCHC 31.0 30.0 - 36.0 g/dL   RDW 84.6 96.2 - 95.2 %   Platelets 260 150 - 400 K/uL   nRBC 0.0 0.0 - 0.2 %    Comment: Performed at [facility], [address], [place], [place] [address]  Renal function panel     Status: Abnormal   Collection Time: 06/20/22  7:53 AM  Result Value Ref Range   Sodium 133 (L) 135 - 145 mmol/L   Potassium 4.5 3.5 - 5.1 mmol/L   Chloride 98 98 - 111 mmol/L   CO2 20 (L) 22 - 32 mmol/L   Glucose, Bld 94 70 - 99  mg/dL    Comment: Glucose reference range applies only to samples taken after fasting for at least 8 hours.   BUN 84 (H) 8 - 23 mg/dL   Creatinine, Ser 4.40 (H) 0.61 - 1.24 mg/dL   Calcium 7.7 (L) 8.9 - 10.3 mg/dL   Phosphorus 6.9 (H) 2.5 - 4.6 mg/dL   Albumin 3.1 (L) 3.5 - 5.0 g/dL   GFR, Estimated 8 (L) >60 mL/min    Comment: (NOTE) Calculated using the CKD-EPI Creatinine Equation (2021)    Anion gap 15 5 - 15    Comment: Performed at [facility], [address], [place], [place] [address]   ECHO TEE  Result Date: 06/19/2022    TRANSESOPHOGEAL ECHO REPORT   Patient Name:   Glenn Olson Date of Exam: 06/19/2022 Medical Rec #:  [id number]      Height:  69.0 in Accession #:    [id number]     Weight:       162.5 lb Date of Birth:  [birth year]      BSA:          1.891 m Patient Age:    81 years       BP:           132/118 mmHg Patient Gender: M              HR:           76 bpm. Exam Location:  Inpatient Procedure: Transesophageal Echo, Cardiac Doppler and Color Doppler Indications:     I48.91* Unspeicified atrial fibrillation  History:         Patient has prior history of Echocardiogram examinations, most                  recent 06/05/2022. Abnormal ECG, Arrythmias:Atrial Fibrillation                  and Atrial Flutter; Signs/Symptoms:Chest Pain.  Sonographer:     Sheralyn Boatman RDCS Referring Phys:  [id number] Jonita Albee Diagnosing Phys: Lennie Odor MD PROCEDURE: After discussion of the risks and benefits of a TEE, an informed consent was obtained from the patient. TEE procedure time was 10 minutes. The transesophogeal probe was passed without difficulty through the esophogus of the patient. Imaged were obtained with the patient in a left lateral decubitus position. Sedation performed by different physician. The patient was monitored while under deep sedation. Anesthestetic sedation was provided intravenously by Anesthesiology: 150mg  of Propofol, 60mg  of Lidocaine. Image quality was  excellent. The patient's vital signs; including heart rate, blood pressure, and oxygen saturation; remained stable throughout the procedure. The patient developed no complications during the procedure. A successful direct current cardioversion was performed at 200 joules with 1 attempt.  IMPRESSIONS  1. Left ventricular ejection fraction, by estimation, is 50 to 55%. The left ventricle has low normal function. The left ventricle has no regional wall motion abnormalities.  2. Right ventricular systolic function is normal. The right ventricular size is normal.  3. Left atrial size was mild to moderately dilated. No left atrial/left atrial appendage thrombus was detected. The LAA emptying velocity was 39 cm/s.  4. Right atrial size was mildly dilated.  5. The mitral valve is grossly normal. Mild mitral valve regurgitation. No evidence of mitral stenosis.  6. Tricuspid valve regurgitation is mild to moderate.  7. The aortic valve is tricuspid. Aortic valve regurgitation is mild. No aortic stenosis is present.  8. 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 Conclusion(s)/Recommendation(s): No LA/LAA thrombus identified. Successful cardioversion performed with restoration of normal sinus rhythm. FINDINGS  Left Ventricle: Left ventricular ejection fraction, by estimation, is 50 to 55%. The left ventricle has low normal function. The left ventricle has no regional wall motion abnormalities. The left ventricular internal cavity size was normal in size. Right Ventricle: The right ventricular size is normal. No increase in right ventricular wall thickness. Right ventricular systolic function is normal. Left Atrium: Left atrial size was mild to moderately dilated. No left atrial/left atrial appendage thrombus was detected. The LAA emptying velocity was 39 cm/s. Right Atrium: Right atrial size was mildly dilated. Pericardium: There is no evidence of pericardial effusion. Mitral Valve: The mitral  valve is grossly normal. Mild mitral valve regurgitation. No evidence of mitral valve stenosis. Tricuspid Valve: The tricuspid valve is normal in structure. Tricuspid  valve regurgitation is mild to moderate. Aortic Valve: The aortic valve is tricuspid. Aortic valve regurgitation is mild. No aortic stenosis is present. Pulmonic Valve: The pulmonic valve was grossly normal. Pulmonic valve regurgitation is not visualized. No evidence of pulmonic stenosis. Aorta: The aortic root and ascending aorta are structurally normal, with no evidence of dilitation. There is severe (Grade IV) layered plaque involving the descending aorta. Venous: The left upper pulmonary vein is normal. IAS/Shunts: No atrial level shunt detected by color flow Doppler. Additional Comments: Spectral Doppler performed.  AORTA Ao Root diam: 3.57 cm Lennie Odor MD Electronically signed by Lennie Odor MD Signature Date/Time: 06/19/2022/10:23:07 AM    Final    EP STUDY  Result Date: 06/19/2022 See surgical note for result.     There were no vitals taken for this visit.  Medical Problem List and Plan: 1. Functional deficits secondary to debility.  -patient may shower  -ELOS/Goals: 5-7 days  Order grounds pass 2.  Antithrombotics: -DVT/anticoagulation:  Pharmaceutical: Eliquis  -antiplatelet therapy: N/A 3. Pain Management:  Tylenol prn 4. Mood/Behavior/Sleep: LCSW to follow for evaluation and support.   --insomnia-->no issues with sleeping. Will add melatonin prn.   -antipsychotic agents: N/A 5. Neuropsych/cognition: This patient is capable of making decisions on his own behalf. 6. Skin/Wound Care: Routine pressure relief measures.   7. Fluids/Electrolytes/Nutrition: Strict I/O. 1200 cc FR/day             --renal diet  8. ESRD: HD at the end of the day to help with tolerance of therapy on MWF  --strict I/O. Daily weights. Renal diet w/1200 cc FR.   9. A fib with RVR: Monitor HR TID--continue Cardizem for rate control.  Check magnesium level tomorrow. -- Eliquis for CVA prophylaxis  10. Neurogenic bladder s/p SPC: Does not remember why or who?  --SPC has not been changed for years per patient.  11. Anemia of chronic disease: CBC with HD session and aranesp every Friday for erythropoiesis. --transfusion prn symptoms. Hgb stable in 7-8 range.    12. Code status: DNR w/full scope of treatment. --per Palliative care w/option on comfort measures if he feels that he wants to stop HD.   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   14. Screening for vitamin D deficiency: check vitamin D level tomorrow  15. Iron deficiency anemia: add iron supplement  I have personally performed a face to face diagnostic evaluation, including, but not limited to relevant history and physical exam findings, of this patient and developed relevant assessment and plan.  Additionally, I have reviewed and concur with the physician assistant's documentation above.  Delle Reining, PA-C  Horton Chin, MD 06/20/2022

## 2022-06-20 NOTE — Discharge Summary (Addendum)
Physician Discharge Summary  Glenn Olson ZOX:[id number] DOB: 12/18/1940 DOA: 05/30/2022  PCP: Benita Stabile, MD  Admit date: 05/30/2022 Discharge date: 06/20/2022 Recommendations for Outpatient Follow-up:  Follow up with PCP in 1 weeks-call for appointment Please obtain BMP/CBC in one week  Discharge Dispo: CIR Discharge Condition: Stable Code Status:   Code Status: DNR Diet recommendation:  Diet Order             Diet Heart Room service appropriate? Yes with Assist; Fluid consistency: Thin  Diet effective now                    Brief/Interim Summary: 82 y.o. male with medical history significant for ESRD, neurogenic atonic bladder presented to [facility] with headache and chest pain for 1 week.  Patient was on hemodialysis but had stopped going for hemodialysis for 1 year now.  Has been having poor oral intake with nausea and vomiting.  In the ED, patient was mildly tachypneic and tachycardic with elevated blood pressure.  Chest x-ray with increased left lower lower lobe markings. CO2 less than 7.  BUN of 241.  Potassium 4.5.  Cr 13.8.Troponin 48 > 52.  Nephrology was consulted and decision was to proceed with hemodialysis.  Patient was then transferred to [facility].   Patient has had HD cath placed and started on hemodialysis.  Encephalopathy improving. Hospital course complicated by A-fib with RVR.  He was started on p.o. Cardizem and IV heparin.  TTE without significant finding.  RVR improved. Transitioned to p.o. Cardizem CD, Eliquis and signed off by cardiology. Patient was treated for anemia of CKD, hypertension, secondary hyperparathyroidism with hypocalcemia and hyperphosphatemia.Remains weak and deconditioned with debility PT OT recommending CIR and awaiting for placement Ongoing uncontrolled A-fib with RVR cardiology reconsulted 5/17 Cardizem dose increased, underwent TEE DC cardioversion 5/21 and in sinus rhythm.  Awaiting for CIR admission. Patient will be  discharged to inpatient rehab as he has been approved   Discharge Diagnoses:  Principal Problem:   Uremia Active Problems:   Metabolic acidosis   Chest pain   Atonic neurogenic bladder   ESRD on hemodialysis (HCC)   Chronic indwelling Foley catheter   Hypokalemia   Hypomagnesemia   Atrial flutter (HCC)   Persistent atrial fibrillation (HCC)   Malnutrition of moderate degree  New onset a flutter/A-fib with RVR:TTE unremarkable, heart rate remained poorly controlled despite increasing Cardizem and cardiology was reconsulted 5/17 and s/p TEE/DCC, in nsr.Continue on Eliquis, CARDIZEM 300 mg daily .  Acute uremic encephalopathy 2/2 ESRD after being off HD for a year: Mental status at baseline, cont hd  ESRD: Nephrology following, getting HD 5/22. Chest [facility] in place, but using his left AVG for HD, so likely pull out [facility] soon at CIR os AS OP.  Secondary hyperparathyroidism with hypocalcium and hyperphosphatemia> continue PhosLo,>started on  Calcitrol 0.5 mcg daily> calcium and phos much better, cont PhosLo   Hypokalemia/hypocalcemia/hypomagnesemia/Hyperkalemia- labs stable as below. Recent Labs  Lab 06/15/22 0855 06/19/22 0843 06/20/22 0753  K 4.7 4.3 4.5  CALCIUM 8.6*  --  7.7*  PHOS 4.9*  --  6.9*   Atonic neurogenic bladder: No issues, cont SP catheter and catheter care by RN. Anemia of chronic kidney disease hb stable holding mid 8 gm as below, monitor. s/p 1 unit prbc 06/01/22. Recent Labs  Lab 06/14/22 0154 06/15/22 0202 06/19/22 0843 06/20/22 0753  HGB 8.1* 8.0* 8.5* 7.6*  HCT 26.2* 26.0* 25.0* 24.5*  Deconditioning, debility:Continue with  PT OT awaiting for CIR once a fib better.     Thrombocytopenia: Resolved.  Moderate malnutrition RD following augment diet as below Nutrition Problem: Moderate Malnutrition Etiology: chronic illness (ESRD) Signs/Symptoms: moderate fat depletion, moderate muscle depletion, severe muscle depletion Interventions: Magic cup, MVI,  C[employer] Instant Breakfast   Consults: Nephrology, cardiology Subjective: Alert awake oriented heart rate is controlled getting dialysis today  Discharge Exam: Vitals:   06/20/22 0900 06/20/22 0930  BP: 136/81 136/77  Pulse: 76 78  Resp: 17 16  Temp:    SpO2: 98% 94%   General: Pt is alert, awake, not in acute distress Cardiovascular: RRR, S1/S2 +, no rubs, no gallops Respiratory: CTA bilaterally, no wheezing, no rhonchi Abdominal: Soft, NT, ND, bowel sounds + Extremities: no edema, no cyanosis  Discharge Instructions  Discharge Instructions     No wound care   Complete by: As directed       Allergies as of 06/20/2022   No Known Allergies      Medication List     STOP taking these medications    colchicine 0.6 MG tablet       TAKE these medications    apixaban 2.5 MG Tabs tablet Commonly known as: ELIQUIS Take 1 tablet (2.5 mg total) by mouth 2 (two) times daily.   calcitRIOL 0.5 MCG capsule Commonly known as: ROCALTROL Take 1 capsule (0.5 mcg total) by mouth daily.   calcium acetate 667 MG capsule Commonly known as: PHOSLO Take 1 capsule (667 mg total) by mouth 3 (three) times daily with meals.   diltiazem 300 MG 24 hr capsule Commonly known as: CARDIZEM CD Take 1 capsule (300 mg total) by mouth daily.   feeding supplement (NEPRO CARB STEADY) Liqd Take 237 mLs by mouth 2 (two) times daily between meals.   melatonin 3 MG Tabs tablet Take 1 tablet (3 mg total) by mouth at bedtime as needed.        Follow-up Information     Benita Stabile, MD Follow up in 1 week(s).   Specialty: Internal Medicine Contact information: [address] [place] [address] [phone]                No Known Allergies  The results of significant diagnostics from this hospitalization (including imaging, microbiology, ancillary and laboratory) are listed below for reference.    Microbiology: No results found for this or any previous visit  (from the past 240 hour(s)).  Procedures/Studies: ECHO TEE  Result Date: 06/19/2022    TRANSESOPHOGEAL ECHO REPORT   Patient Name:   Glenn Olson Date of Exam: 06/19/2022 Medical Rec #:  [id number]      Height:       69.0 in Accession #:    [id number]     Weight:       162.5 lb Date of Birth:  [birth year]      BSA:          1.891 m Patient Age:    81 years       BP:           132/118 mmHg Patient Gender: M              HR:           76 bpm. Exam Location:  Inpatient Procedure: Transesophageal Echo, Cardiac Doppler and Color Doppler Indications:     I48.91* Unspeicified atrial fibrillation  History:         Patient has prior history of Echocardiogram  examinations, most                  recent 06/05/2022. Abnormal ECG, Arrythmias:Atrial Fibrillation                  and Atrial Flutter; Signs/Symptoms:Chest Pain.  Sonographer:     Sheralyn Boatman RDCS Referring Phys:  [id number] Jonita Albee Diagnosing Phys: Lennie Odor MD PROCEDURE: After discussion of the risks and benefits of a TEE, an informed consent was obtained from the patient. TEE procedure time was 10 minutes. The transesophogeal probe was passed without difficulty through the esophogus of the patient. Imaged were obtained with the patient in a left lateral decubitus position. Sedation performed by different physician. The patient was monitored while under deep sedation. Anesthestetic sedation was provided intravenously by Anesthesiology: 150mg  of Propofol, 60mg  of Lidocaine. Image quality was excellent. The patient's vital signs; including heart rate, blood pressure, and oxygen saturation; remained stable throughout the procedure. The patient developed no complications during the procedure. A successful direct current cardioversion was performed at 200 joules with 1 attempt.  IMPRESSIONS  1. Left ventricular ejection fraction, by estimation, is 50 to 55%. The left ventricle has low normal function. The left ventricle has no regional wall motion  abnormalities.  2. Right ventricular systolic function is normal. The right ventricular size is normal.  3. Left atrial size was mild to moderately dilated. No left atrial/left atrial appendage thrombus was detected. The LAA emptying velocity was 39 cm/s.  4. Right atrial size was mildly dilated.  5. The mitral valve is grossly normal. Mild mitral valve regurgitation. No evidence of mitral stenosis.  6. Tricuspid valve regurgitation is mild to moderate.  7. The aortic valve is tricuspid. Aortic valve regurgitation is mild. No aortic stenosis is present.  8. 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 Conclusion(s)/Recommendation(s): No LA/LAA thrombus identified. Successful cardioversion performed with restoration of normal sinus rhythm. FINDINGS  Left Ventricle: Left ventricular ejection fraction, by estimation, is 50 to 55%. The left ventricle has low normal function. The left ventricle has no regional wall motion abnormalities. The left ventricular internal cavity size was normal in size. Right Ventricle: The right ventricular size is normal. No increase in right ventricular wall thickness. Right ventricular systolic function is normal. Left Atrium: Left atrial size was mild to moderately dilated. No left atrial/left atrial appendage thrombus was detected. The LAA emptying velocity was 39 cm/s. Right Atrium: Right atrial size was mildly dilated. Pericardium: There is no evidence of pericardial effusion. Mitral Valve: The mitral valve is grossly normal. Mild mitral valve regurgitation. No evidence of mitral valve stenosis. Tricuspid Valve: The tricuspid valve is normal in structure. Tricuspid valve regurgitation is mild to moderate. Aortic Valve: The aortic valve is tricuspid. Aortic valve regurgitation is mild. No aortic stenosis is present. Pulmonic Valve: The pulmonic valve was grossly normal. Pulmonic valve regurgitation is not visualized. No evidence of pulmonic stenosis. Aorta:  The aortic root and ascending aorta are structurally normal, with no evidence of dilitation. There is severe (Grade IV) layered plaque involving the descending aorta. Venous: The left upper pulmonary vein is normal. IAS/Shunts: No atrial level shunt detected by color flow Doppler. Additional Comments: Spectral Doppler performed.  AORTA Ao Root diam: 3.57 cm Lennie Odor MD Electronically signed by Lennie Odor MD Signature Date/Time: 06/19/2022/10:23:07 AM    Final    EP STUDY  Result Date: 06/19/2022 See surgical note for result.  ECHOCARDIOGRAM COMPLETE  Result Date:  06/05/2022    ECHOCARDIOGRAM REPORT   Patient Name:   PACER WEHR Date of Exam: 06/05/2022 Medical Rec #:  [id number]      Height:       69.0 in Accession #:    [id number]     Weight:       153.0 lb Date of Birth:  10-30-1940      BSA:          1.844 m Patient Age:    81 years       BP:           116/84 mmHg Patient Gender: M              HR:           117 bpm. Exam Location:  Inpatient Procedure: 2D Echo, Cardiac Doppler and Color Doppler Indications:    Atrial Fibrillation I48.91  History:        Patient has no prior history of Echocardiogram examinations.                 Signs/Symptoms:Chest Pain.  Sonographer:    Lucendia Herrlich Referring Phys: [id number] [facility]  Sonographer Comments: No apical window. IMPRESSIONS  1. Left ventricular ejection fraction, by estimation, is 55 to 60%. The left ventricle has normal function. The left ventricle has no regional wall motion abnormalities. There is mild concentric left ventricular hypertrophy. Left ventricular diastolic function could not be evaluated.  2. Right ventricular systolic function is normal. The right ventricular size is normal. There is mildly elevated pulmonary artery systolic pressure. The estimated right ventricular systolic pressure is 44.2 mmHg.  3. Left atrial size was moderately dilated.  4. Right atrial size was mildly dilated.  5. The mitral valve is grossly normal.  Trivial mitral valve regurgitation. No evidence of mitral stenosis.  6. The aortic valve is tricuspid. Aortic valve regurgitation is not visualized. No aortic stenosis is present.  7. The inferior vena cava is dilated in size with <50% respiratory variability, suggesting right atrial pressure of 15 mmHg. FINDINGS  Left Ventricle: Left ventricular ejection fraction, by estimation, is 55 to 60%. The left ventricle has normal function. The left ventricle has no regional wall motion abnormalities. The left ventricular internal cavity size was normal in size. There is  mild concentric left ventricular hypertrophy. Left ventricular diastolic function could not be evaluated due to atrial fibrillation. Left ventricular diastolic function could not be evaluated. Right Ventricle: The right ventricular size is normal. No increase in right ventricular wall thickness. Right ventricular systolic function is normal. There is mildly elevated pulmonary artery systolic pressure. The tricuspid regurgitant velocity is 2.70  m/s, and with an assumed right atrial pressure of 15 mmHg, the estimated right ventricular systolic pressure is 44.2 mmHg. Left Atrium: Left atrial size was moderately dilated. Right Atrium: Right atrial size was mildly dilated. Pericardium: Trivial pericardial effusion is present. Mitral Valve: The mitral valve is grossly normal. Trivial mitral valve regurgitation. No evidence of mitral valve stenosis. Tricuspid Valve: The tricuspid valve is grossly normal. Tricuspid valve regurgitation is mild . No evidence of tricuspid stenosis. Aortic Valve: The aortic valve is tricuspid. Aortic valve regurgitation is not visualized. No aortic stenosis is present. Pulmonic Valve: The pulmonic valve was grossly normal. Pulmonic valve regurgitation is trivial. No evidence of pulmonic stenosis. Aorta: The aortic root and ascending aorta are structurally normal, with no evidence of dilitation. Venous: The inferior vena cava is  dilated in size with less than 50% respiratory  variability, suggesting right atrial pressure of 15 mmHg. IAS/Shunts: The atrial septum is grossly normal.  LEFT VENTRICLE PLAX 2D LVIDd:         4.50 cm LVIDs:         3.30 cm LV PW:         1.60 cm LV IVS:        1.20 cm LVOT diam:     2.10 cm LVOT Area:     3.46 cm  IVC IVC diam: 2.70 cm LEFT ATRIUM           Index LA diam:      5.40 cm 2.93 cm/m LA Vol (A4C): 85.0 ml 46.10 ml/m                        PULMONIC VALVE AORTA                 PR End Diast Vel: 9.36 msec Ao Root diam: 3.70 cm Ao Asc diam:  3.20 cm MITRAL VALVE               TRICUSPID VALVE MV Area (PHT): 10.68 cm   TR Peak grad:   29.2 mmHg MV Decel Time: 71 msec     TR Vmax:        270.00 cm/s MV E velocity: 91.70 cm/s MV A velocity: 50.00 cm/s  SHUNTS MV E/A ratio:  1.83        Systemic Diam: 2.10 cm Lennie Odor MD Electronically signed by Lennie Odor MD Signature Date/Time: 06/05/2022/3:11:07 PM    Final    DG CHEST PORT 1 VIEW  Result Date: 05/31/2022 CLINICAL DATA:  Insertion of tunneled venous catheter EXAM: PORTABLE CHEST 1 VIEW COMPARISON:  Portable exam 1455 hours compared to 05/30/2022 FINDINGS: RIGHT jugular line tip projects over RIGHT atrium. Enlargement of cardiac silhouette. Mediastinal contours and pulmonary vascularity normal. Lungs clear. No infiltrate, pleural effusion, or pneumothorax. Osseous structures unremarkable. IMPRESSION: No pneumothorax following RIGHT jugular line insertion. Electronically Signed   By: Ulyses Southward M.D.   On: 05/31/2022 15:09   HYBRID OR IMAGING ([facility] ONLY)  Result Date: 05/31/2022 There is no interpretation for this exam.  This order is for images obtained during a surgical procedure.  Please See "Surgeries" Tab for more information regarding the procedure.   CT Head Wo Contrast  Result Date: 05/30/2022 CLINICAL DATA:  Acute onset headache. EXAM: CT HEAD WITHOUT CONTRAST TECHNIQUE: Contiguous axial images were obtained from the base of the skull  through the vertex without intravenous contrast. RADIATION DOSE REDUCTION: This exam was performed according to the departmental dose-optimization program which includes automated exposure control, adjustment of the mA and/or kV according to patient size and/or use of iterative reconstruction technique. COMPARISON:  None Available. FINDINGS: Brain: No evidence of intracranial hemorrhage, acute infarction, hydrocephalus, extra-axial collection, or mass lesion/mass effect. Mild diffuse cerebral atrophy noted. Vascular:  No hyperdense vessel or other acute findings. Skull: No evidence of fracture or other significant bone abnormality. Sinuses/Orbits: Near-complete opacification of left maxillary sinus. Other: None. IMPRESSION: No acute intracranial abnormality. Mild cerebral atrophy. Left maxillary sinus disease. Electronically Signed   By: Danae Orleans M.D.   On: 05/30/2022 16:07   DG Chest Port 1 View  Result Date: 05/30/2022 CLINICAL DATA:  Chest pain, headaches EXAM: PORTABLE CHEST 1 VIEW COMPARISON:  02/06/2021 FINDINGS: Transverse diameter of heart is increased. Central pulmonary vessels are prominent. There are no signs of alveolar pulmonary edema. Increased markings  are seen in left lower lung field. Possible calcified granuloma is seen in right lower lung field. Patient's chin is partially obscuring the left apex. There is no pleural effusion or pneumothorax. IMPRESSION: Central pulmonary vessels are prominent without signs of alveolar pulmonary edema. Increased markings are seen in left lower lung field which may suggest crowding of normal bronchovascular structures or atelectasis/pneumonia. Electronically Signed   By: Ernie Avena M.D.   On: 05/30/2022 14:57    Labs: BNP (last 3 results) No results for input(s): "BNP" in the last 8760 hours. Basic Metabolic Panel: Recent Labs  Lab 06/15/22 0855 06/19/22 0843 06/20/22 0753  NA 135 136 133*  K 4.7 4.3 4.5  CL 99 98 98  CO2 25  --  20*   GLUCOSE 90 93 94  BUN 55* 56* 84*  CREATININE 5.91* 5.30* 6.42*  CALCIUM 8.6*  --  7.7*  PHOS 4.9*  --  6.9*   Liver Function Tests: Recent Labs  Lab 06/15/22 0855 06/20/22 0753  ALBUMIN 2.8* 3.1*   No results for input(s): "LIPASE", "AMYLASE" in the last 168 hours. No results for input(s): "AMMONIA" in the last 168 hours. CBC: Recent Labs  Lab 06/14/22 0154 06/15/22 0202 06/19/22 0843 06/20/22 0753  WBC 6.6 6.5  --  5.8  HGB 8.1* 8.0* 8.5* 7.6*  HCT 26.2* 26.0* 25.0* 24.5*  MCV 96.3 95.6  --  96.8  PLT 241 231  --  260   Anemia work up No results for input(s): "VITAMINB12", "FOLATE", "FERRITIN", "TIBC", "IRON", "RETICCTPCT" in the last 72 hours. Urinalysis    Component Value Date/Time   COLORURINE YELLOW 11/29/2020 1705   APPEARANCEUR CLOUDY (A) 11/29/2020 1705   LABSPEC 1.006 11/29/2020 1705   PHURINE 8.0 11/29/2020 1705   GLUCOSEU NEGATIVE 11/29/2020 1705   HGBUR SMALL (A) 11/29/2020 1705   BILIRUBINUR NEGATIVE 11/29/2020 1705   KETONESUR NEGATIVE 11/29/2020 1705   PROTEINUR 100 (A) 11/29/2020 1705   NITRITE NEGATIVE 11/29/2020 1705   LEUKOCYTESUR LARGE (A) 11/29/2020 1705   Sepsis Labs Recent Labs  Lab 06/14/22 0154 06/15/22 0202 06/20/22 0753  WBC 6.6 6.5 5.8   Microbiology No results found for this or any previous visit (from the past 240 hour(s)).  Time coordinating discharge: 25 minutes  SIGNED: Lanae Boast, MD  [facility] 06/20/2022, 9:59 AM  If 7PM-7AM, please contact night-coverage [email or web address]

## 2022-06-20 NOTE — Progress Notes (Signed)
Patient back to the unit with Shadow Chart.    Tilden Dome, LPN

## 2022-06-20 NOTE — Progress Notes (Signed)
Received patient in bed to unit.  Alert and oriented.  Informed consent signed and in chart.   TX duration:4  Patient tolerated well.  Transported back to the room  Alert, without acute distress.  Hand-off given to patient'Glenn nurse.   Access used: left AVG Access issues: none  Total UF removed: 1.9L Medication(Glenn) given: calcitriol   06/20/22 1232  Vitals  Temp 98.3 F (36.8 C)  Temp Source Oral  BP (!) 116/92  MAP (mmHg) 101  BP Location Right Arm  BP Method Automatic  Patient Position (if appropriate) Lying  Pulse Rate 87  Pulse Rate Source Monitor  ECG Heart Rate 100  Resp (!) 23  Oxygen Therapy  SpO2 99 %  O2 Device Room Air  During Treatment Monitoring  HD Safety Checks Performed Yes  Intra-Hemodialysis Comments Tx completed;Tolerated well  Dialysis Fluid Bolus Normal Saline  Bolus Amount (mL) 300 mL      Glenn Olson [facility]

## 2022-06-20 NOTE — Progress Notes (Signed)
Inpatient Rehab Admissions Coordinator:   I have a bed for this patient to admit to CIR today.  Dr. Jonathon Bellows in agreement and [facility] aware.  Will let pt/family know.   Estill Dooms, PT, DPT Admissions Coordinator [phone] 06/20/22  10:23 AM

## 2022-06-20 NOTE — Progress Notes (Signed)
Occupational Therapy Treatment Patient Details Name: Glenn Olson MRN: [id number] DOB: [birth year] Today's Date: 06/20/2022   History of present illness Glenn Olson is a 82 y.o. male admitted 5/1  presented to hospital with headache and chest pain for 1 week.  Patient was on hemodialysis but had stopped going for hemodialysis for 1 year now.  Has been having poor oral intake with nausea and vomiting.  In the ED, patient was mildly tachypneic and tachycardic with elevated blood pressure as well as hypokalemia.  Chest x-ray with increased left lower lower lobe markings. HD reinitiated. Afib with RVR developed 5/9, transferred to cardiac progressive. PMH: ESRD, neurogenic atonic bladder with suprapubic catheter.   OT comments  Patient demonstrating good progress with supervision to get to EOB and min assist to donn gown and socks seated on EOB. Patient able to ambulate to sink for grooming with seated rest break when complete. Patient performing mobility with RW when transport arrived to take patient to HD. Patient will benefit from intensive inpatient follow up therapy, >3 hours/day. Acute OT to continue to follow.    Recommendations for follow up therapy are one component of a multi-disciplinary discharge planning process, led by the attending physician.  Recommendations may be updated based on patient status, additional functional criteria and insurance authorization.    Assistance Recommended at Discharge Frequent or constant Supervision/Assistance  Patient can return home with the following  A lot of help with walking and/or transfers;A lot of help with bathing/dressing/bathroom;Assistance with cooking/housework;Assist for transportation;Help with stairs or ramp for entrance   Equipment Recommendations  Other (comment) (TBD)    Recommendations for Other Services      Precautions / Restrictions Precautions Precautions: Fall Precaution Comments: watch HR; suprapubic  catheter Restrictions Weight Bearing Restrictions: No       Mobility Bed Mobility Overal bed mobility: Needs Assistance Bed Mobility: Supine to Sit, Sit to Supine     Supine to sit: Supervision Sit to supine: Supervision   General bed mobility comments: cues for reail use    Transfers Overall transfer level: Needs assistance Equipment used: Rolling walker (2 wheels) Transfers: Sit to/from Stand Sit to Stand: Min guard           General transfer comment: cues for hand placement     Balance Overall balance assessment: Mild deficits observed, not formally tested                                         ADL either performed or assessed with clinical judgement   ADL Overall ADL's : Needs assistance/impaired     Grooming: Wash/dry hands;Wash/dry face;Brushing hair;Min guard;Standing Grooming Details (indicate cue type and reason): at sink         Upper Body Dressing : Sitting;Minimal assistance Upper Body Dressing Details (indicate cue type and reason): donned gown to cover back Lower Body Dressing: Minimal assistance;Sitting/lateral leans Lower Body Dressing Details (indicate cue type and reason): to donn socks                    Extremity/Trunk Assessment              Vision       Perception     Praxis      Cognition Arousal/Alertness: Awake/alert Behavior During Therapy: WFL for tasks assessed/performed Overall Cognitive Status: No family/caregiver present to determine baseline cognitive functioning Area of Impairment: Memory,  Problem solving                     Memory: Decreased recall of precautions, Decreased short-term memory Following Commands: Follows one step commands consistently Safety/Judgement: Decreased awareness of safety, Decreased awareness of deficits Awareness: Emergent Problem Solving: Difficulty sequencing, Requires verbal cues General Comments: followed directions well        Exercises       Shoulder Instructions       General Comments limited session due to arrival of transport for HD    Pertinent Vitals/ Pain       Pain Assessment Pain Assessment: No/denies pain Pain Intervention(s): Monitored during session  Home Living                                          Prior Functioning/Environment              Frequency  Min 2X/week        Progress Toward Goals  OT Goals(current goals can now be found in the care plan section)  Progress towards OT goals: Progressing toward goals  Acute Rehab OT Goals Patient Stated Goal: get better OT Goal Formulation: With patient Time For Goal Achievement: 06/19/22 Potential to Achieve Goals: Fair ADL Goals Pt Will Perform Grooming: standing;with supervision Pt Will Perform Upper Body Dressing: with set-up;sitting Pt Will Perform Lower Body Dressing: with min guard assist;sit to/from stand Pt Will Transfer to Toilet: with min guard assist;ambulating;regular height toilet  Plan Discharge plan needs to be updated    Co-evaluation                 AM-PAC OT "6 Clicks" Daily Activity     Outcome Measure   Help from another person eating meals?: None Help from another person taking care of personal grooming?: None Help from another person toileting, which includes using toliet, bedpan, or urinal?: A Lot Help from another person bathing (including washing, rinsing, drying)?: A Lot Help from another person to put on and taking off regular upper body clothing?: A Little Help from another person to put on and taking off regular lower body clothing?: A Lot 6 Click Score: 17    End of Session Equipment Utilized During Treatment: Gait belt;Rolling walker (2 wheels)  OT Visit Diagnosis: Unsteadiness on feet (R26.81);Other abnormalities of gait and mobility (R26.89);Muscle weakness (generalized) (M62.81)   Activity Tolerance Patient tolerated treatment well   Patient Left in bed;Other  (comment) (with transport)   Nurse Communication Mobility status        Time: 1610-9604 OT Time Calculation (min): 16 min  Charges: OT General Charges $OT Visit: 1 Visit OT Treatments $Self Care/Home Management : 8-22 mins  Alfonse Flavors, OTA Acute Rehabilitation Services  Office [phone]   Dewain Penning 06/20/2022, 11:01 AM

## 2022-06-21 LAB — HEPATITIS B SURFACE ANTIGEN: Hepatitis B Surface Ag: NONREACTIVE

## 2022-06-21 LAB — VITAMIN D 25 HYDROXY (VIT D DEFICIENCY, FRACTURES): Vit D, 25-Hydroxy: 18.36 ng/mL — ABNORMAL LOW (ref 30–100)

## 2022-06-21 MED ORDER — FERROUS SULFATE 325 (65 FE) MG PO TABS
325.0000 mg | ORAL_TABLET | Freq: Every day | ORAL | Status: DC
  Administered 2022-06-22 – 2022-06-28 (×7): 325 mg via ORAL
  Filled 2022-06-21 (×7): qty 1

## 2022-06-21 NOTE — Progress Notes (Addendum)
Inpatient Rehabilitation Care Coordinator Assessment and Plan Patient Details  Name: Glenn Olson MRN: [id number] Date of Birth: 1940-08-05  Today's Date: 06/21/2022  Hospital Problems: Principal Problem:   Debility  Past Medical History:  Past Medical History:  Diagnosis Date   Anemia    Chronic kidney disease    Dysrhythmia    GERD (gastroesophageal reflux disease)    Neurogenic bladder    Past Surgical History:  Past Surgical History:  Procedure Laterality Date   AV FISTULA PLACEMENT Left 09/27/2020   Procedure: LEFT ARM ARTERIOVENOUS GRAFT PLACEMENT USING GORE-TEX 4-7MM X 45CM  VASCULAR GRAFT;  Surgeon: Larina Earthly, MD;  Location: [facility];  Service: Vascular;  Laterality: Left;   CAPD INSERTION N/A 03/17/2021   Procedure: ATTEMPTED INSERTION OF LAPAROSCOPIC CONTINUOUS AMBULATORY PERITONEAL DIALYSIS (CAPD) CATHETER;  Surgeon: Maeola Harman, MD;  Location: [facility] OR;  Service: Vascular;  Laterality: N/A;   CARDIOVERSION N/A 06/19/2022   Procedure: CARDIOVERSION;  Surgeon: Sande Rives, MD;  Location: [facility] INVASIVE CV LAB;  Service: Cardiovascular;  Laterality: N/A;   CYSTOURETHROSCOPY     INSERTION OF DIALYSIS CATHETER Right 09/27/2020   Procedure: INSERTION OF PALINDROME TUNNELED DIALYSIS CATHETER 14.33f X 23CM;  Surgeon: Larina Earthly, MD;  Location: [facility];  Service: Vascular;  Laterality: Right;   INSERTION OF DIALYSIS CATHETER Right 05/31/2022   Procedure: INSERTION OF DIALYSIS CATHETER;  Surgeon: Victorino Sparrow, MD;  Location: [facility] OR;  Service: Vascular;  Laterality: Right;   IR REMOVAL TUN CV CATH W/O FL  06/20/2022   REMOVAL OF A DIALYSIS CATHETER N/A 12/06/2020   Procedure: MINOR REMOVAL OF A DIALYSIS CATHETER;  Surgeon: Larina Earthly, MD;  Location: [facility];  Service: Vascular;  Laterality: N/A;   SUPRAPUBIC CATHETER PLACEMENT     TEE WITHOUT CARDIOVERSION N/A 06/19/2022   Procedure: TRANSESOPHAGEAL ECHOCARDIOGRAM;  Surgeon: Sande Rives, MD;   Location: [facility] INVASIVE CV LAB;  Service: Cardiovascular;  Laterality: N/A;   Social History:  reports that he has never smoked. He has never used smokeless tobacco. He reports current alcohol use. He reports that he does not use drugs.  Family / Support Systems Marital Status: Divorced Patient Roles: Parent Spouse/Significant Other: N/A Children: Mills Koller Other Supports: N/A Anticipated Caregiver: Son, Alfonse Flavors. Ability/Limitations of Caregiver: None Caregiver Availability: 24/7 Family Dynamics: Support from son and daughter in law.  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[employer] Issues: N/A Guardian/Conservator: Mills Koller   Abuse/Neglect Abuse/Neglect Assessment Can Be Completed: Yes Physical Abuse: Denies Verbal Abuse: Denies Sexual Abuse: Denies Exploitation of patient/patient's resources: Denies Self-Neglect: Denies  Patient response to: Social Isolation - How often do you feel lonely or isolated from those around you?: Never  Emotional Status Pt's affect, behavior and adjustment status: Pleasant Recent Psychosocial Issues: coping Psychiatric History: N/A Substance Abuse History: N/a  Patient / Family Perceptions, Expectations & Goals Pt/Family understanding of illness & functional limitations: yes Premorbid pt/family roles/activities: Independent overall, living with son, not driving. Anticipated changes in roles/activities/participation: Supervision from son and DIL. Pt/family expectations/goals: Supervision to MOD [employer]: None Premorbid Home Care/DME Agencies: Other (Comment) (RW, SPC, Shower seat) Transportation available at discharge: Son or DIL able to  transport. Is the patient able to respond to transportation needs?:  Yes In the past 12 months, has lack of transportation kept you from medical appointments or from getting medications?: No In the past 12 months, has lack of transportation kept you from meetings, work, or from getting things needed for daily living?: No Resource referrals recommended: Neuropsychology  Discharge Planning Living Arrangements: Children Support Systems: Children Type of Residence: Private residence Insurance Resources: [employer], Media planner (specify) (Medicare & Tricare) Financial Resources: Family Support Financial Screen Referred: No Living Expenses: Lives with family Money Management: Patient, Family Does the patient have any problems obtaining your medications?: No Home Management: Independent Patient/Family Preliminary Plans: Plans to continue to manage Care Coordinator Barriers to Discharge: Other (comments) Care Coordinator Barriers to Discharge Comments: If son and DIL unable to provide 24/7, anticpate hiring HC. Care Coordinator Anticipated Follow Up Needs: HH/OP DC Planning Additional Notes/Comments: HD: MWF Expected length of stay: 7-10 Days  Clinical Impression Sw met with patient, introduced self and explained role. Pt lives with son and DIL and anticipates returning. 1 level home, level entry. SW contact information left for son. 24/7 supervision anticipated to be provided by son and DIL, if unable family will hire [facility] for assist with 24/7. No additional questions or concerns.   Andria Rhein 06/21/2022, 12:41 PM

## 2022-06-21 NOTE — Discharge Instructions (Addendum)
Inpatient Rehab Discharge Instructions  Glenn Olson Discharge date and time:  06/28/22  Activities/Precautions/ Functional Status: Activity: no lifting, driving, or strenuous exercise till cleared by Md Diet: renal diet Limit to 1200 cc (5 cups) fluid per day.  Wound Care: keep wound clean and dry   Functional status:  ___ No restrictions     ___ Walk up steps independently ___ 24/7 supervision/assistance   ___ Walk up steps with assistance _X__ Intermittent supervision/assistance  ___ Bathe/dress independently ___ Walk with walker     ___ Bathe/dress with assistance ___ Walk Independently     ___ Shower independently ___ Walk with assistance     _X__ Shower with assistance _X__ No alcohol      ___ Return to work/school ________   Special Instructions:  COMMUNITY REFERRALS UPON DISCHARGE:    Home Health:   PT     OT                    Agency: [facility] Phone: [phone]    Medical Equipment/Items Ordered: Rollator                                                 Agency/Supplier: [employer] [phone]   My questions have been answered and I understand these instructions. I will adhere to these goals and the provided educational materials after my discharge from the hospital.  Patient/Caregiver Signature _______________________________ Date __________  Clinician Signature _______________________________________ Date __________  Please bring this form and your medication list with you to all your follow-up doctor's appointments.   Information on my medicine - ELIQUIS (apixaban)  This medication education was reviewed with me or my healthcare representative as part of my discharge preparation.  Why was Eliquis prescribed for you? Eliquis was prescribed for you to reduce the risk of a blood clot forming that can cause a stroke if you have a medical condition called atrial fibrillation (a type of irregular heartbeat).  What do You need to know about Eliquis ? Take  your Eliquis TWICE DAILY - one tablet in the morning and one tablet in the evening with or without food. If you have difficulty swallowing the tablet whole please discuss with your pharmacist how to take the medication safely.  Take Eliquis exactly as prescribed by your doctor and DO NOT stop taking Eliquis without talking to the doctor who prescribed the medication.  Stopping may increase your risk of developing a stroke.  Refill your prescription before you run out.  After discharge, you should have regular check-up appointments with your healthcare provider that is prescribing your Eliquis.  In the future your dose may need to be changed if your kidney function or weight changes by a significant amount or as you get older.  What do you do if you miss a dose? If you miss a dose, take it as soon as you remember on the same day and resume taking twice daily.  Do not take more than one dose of ELIQUIS at the same time to make up a missed dose.  Important Safety Information A possible side effect of Eliquis is bleeding. You should call your healthcare provider right away if you experience any of the following: Bleeding from an injury or your nose that does not stop. Unusual colored urine (red or dark brown) or unusual colored stools (red or black).  Unusual bruising for unknown reasons. A serious fall or if you hit your head (even if there is no bleeding).  Some medicines may interact with Eliquis and might increase your risk of bleeding or clotting while on Eliquis. To help avoid this, consult your healthcare provider or pharmacist prior to using any new prescription or non-prescription medications, including herbals, vitamins, non-steroidal anti-inflammatory drugs (NSAIDs) and supplements.  This website has more information on Eliquis (apixaban): http://www.eliquis.com/eliquis/home

## 2022-06-21 NOTE — Progress Notes (Signed)
PROGRESS NOTE   Subjective/Complaints: No new complaints this morning MEWs 1 for hypotension, meds reviewed and is on Cardizem for afib, no other anti-hypertensives  ROS: denies pain   Objective:   IR Removal Tun Cv Cath W/O FL  Result Date: 06/20/2022 INDICATION: Patient with tunneled hemodialysis catheter placed 05/31/2022 by Vascular. Patient now has working fistula. Interventional radiology asked to remove tunneled dialysis catheter. EXAM: REMOVAL TUNNELED CENTRAL VENOUS CATHETER MEDICATIONS: None ANESTHESIA/SEDATION: None FLUOROSCOPY: None COMPLICATIONS: None immediate. PROCEDURE: Informed written consent was obtained from the patient after a thorough discussion of the procedural risks, benefits and alternatives. All questions were addressed. Maximal Sterile Barrier Technique was utilized including caps, mask, sterile gowns, sterile gloves, sterile drape, hand hygiene and skin antiseptic. A timeout was performed prior to the initiation of the procedure. The patient's right chest and catheter was prepped and draped in a normal sterile fashion. Heparin was removed from both ports of catheter. Using gentle manual traction the cuff of the catheter was exposed and the catheter was removed in it's entirety. Pressure was held till hemostasis was obtained. A sterile dressing was applied. The patient tolerated the procedure well with no immediate complications. IMPRESSION: Successful catheter removal as described above. Procedure performed by Alwyn Ren NP Electronically Signed   By: Gilmer Mor D.O.   On: 06/20/2022 16:09   Recent Labs    06/19/22 0843 06/20/22 0753  WBC  --  5.8  HGB 8.5* 7.6*  HCT 25.0* 24.5*  PLT  --  260   Recent Labs    06/19/22 0843 06/20/22 0753  NA 136 133*  K 4.3 4.5  CL 98 98  CO2  --  20*  GLUCOSE 93 94  BUN 56* 84*  CREATININE 5.30* 6.42*  CALCIUM  --  7.7*    Intake/Output Summary (Last 24  hours) at 06/21/2022 1224 Last data filed at 06/21/2022 9604 Gross per 24 hour  Intake 807 ml  Output 500 ml  Net 307 ml        Physical Exam: Vital Signs Blood pressure 99/67, pulse 98, temperature 97.8 F (36.6 C), resp. rate 17, height 5\' 8"  (1.727 m), weight 72.7 kg, SpO2 96 %. Gen: no distress, normal appearing HEENT: oral mucosa pink and moist, NCAT Cardio: Reg rate Chest: normal effort, normal rate of breathing Abd: soft, non-distended Ext: no edema Psych: pleasant, flat affect Skin:    General: Skin is warm and dry. RIJ TDC Neurological:     Mental Status: He is alert and oriented to person, place, and time. Decreased short term memory. No focal deficits   Assessment/Plan: 1. Functional deficits which require 3+ hours per day of interdisciplinary therapy in a comprehensive inpatient rehab setting. Physiatrist is providing close team supervision and 24 hour management of active medical problems listed below. Physiatrist and rehab team continue to assess barriers to discharge/monitor patient progress toward functional and medical goals  Care Tool:  Bathing    Body parts bathed by patient: Right arm, Left arm, Chest, Abdomen, Front perineal area, Buttocks, Right upper leg, Left upper leg, Right lower leg, Left lower leg, Face         Bathing assist Assist  Level: Supervision/Verbal cueing     Upper Body Dressing/Undressing Upper body dressing   What is the patient wearing?: Pull over shirt    Upper body assist Assist Level: Set up assist    Lower Body Dressing/Undressing Lower body dressing      What is the patient wearing?: Pants, Incontinence brief     Lower body assist Assist for lower body dressing: Minimal Assistance - Patient > 75%     Toileting Toileting    Toileting assist Assist for toileting: Minimal Assistance - Patient > 75%     Transfers Chair/bed transfer  Transfers assist     Chair/bed transfer assist level: Contact  Guard/Touching assist     Locomotion Ambulation   Ambulation assist      Assist level: Minimal Assistance - Patient > 75% Assistive device: No Device Max distance: 90ft   Walk 10 feet activity   Assist     Assist level: Minimal Assistance - Patient > 75% Assistive device: No Device   Walk 50 feet activity   Assist    Assist level: Minimal Assistance - Patient > 75% Assistive device: No Device    Walk 150 feet activity   Assist Walk 150 feet activity did not occur: Safety/medical concerns (fatigue, decreased balance/coordination)         Walk 10 feet on uneven surface  activity   Assist     Assist level: Minimal Assistance - Patient > 75% Assistive device: Other (comment) (L handrail)   Wheelchair     Assist Is the patient using a wheelchair?: Yes Type of Wheelchair: Manual    Wheelchair assist level: Supervision/Verbal cueing Max wheelchair distance: 125ft    Wheelchair 50 feet with 2 turns activity    Assist        Assist Level: Supervision/Verbal cueing   Wheelchair 150 feet activity     Assist      Assist Level: Supervision/Verbal cueing   Blood pressure 99/67, pulse 98, temperature 97.8 F (36.6 C), resp. rate 17, height 5\' 8"  (1.727 m), weight 72.7 kg, SpO2 96 %.  Medical Problem List and Plan: 1. Functional deficits secondary to debility.             -patient may shower             -ELOS/Goals: 5-7 days             Order grounds pass 2.  Antithrombotics: -DVT/anticoagulation:  Pharmaceutical: Eliquis             -antiplatelet therapy: N/A 3. Pain Management:  Tylenol prn 4. Mood/Behavior/Sleep: LCSW to follow for evaluation and support.              --insomnia-->no issues with sleeping. Will add melatonin prn.              -antipsychotic agents: N/A 5. Neuropsych/cognition: This patient is capable of making decisions on his own behalf. 6. Skin/Wound Care: Routine pressure relief measures.    7.  Fluids/Electrolytes/Nutrition: Strict I/O. 1200 cc FR/day             --renal diet   8. ESRD: HD at the end of the day to help with tolerance of therapy on MWF             --strict I/O. Daily weights. Renal diet w/1200 cc FR.    9. A fib with RVR: Monitor HR TID--continue Cardizem for rate control. Magnesium reviewed and was previously low -- Eliquis for CVA prophylaxis  10. Neurogenic bladder s/p SPC: Does not remember why or who?  --SPC has not been changed for years per patient.   11. Anemia of chronic disease: CBC with HD session and aranesp every Friday for erythropoiesis. --transfusion prn symptoms. Hgb stable in 7-8 range.     12. Code status: DNR w/full scope of treatment. --per Palliative care w/option on comfort measures if he feels that he wants to stop HD.    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    14. Screening for vitamin D deficiency: check vitamin D level, added on   15. Iron deficiency anemia: add iron supplement  >50 minutes spent in review of patient's chart, examination of patient, discussion of progress with PT, adding iron supplement, adding on vitamin D level, review of vitals and medications  LOS: 1 days A FACE TO FACE EVALUATION WAS PERFORMED  Drema Pry Skiler Tye 06/21/2022, 12:24 PM

## 2022-06-21 NOTE — Anesthesia Preprocedure Evaluation (Signed)
Anesthesia Evaluation  Patient identified by MRN, date of birth, ID band Patient awake    Reviewed: Allergy & Precautions, NPO status , Patient's Chart, lab work & pertinent test results  Airway Mallampati: II  TM Distance: >3 FB Neck ROM: Full    Dental  (+) Edentulous Upper, Edentulous Lower, Dental Advisory Given   Pulmonary neg pulmonary ROS    + decreased breath sounds      Cardiovascular + dysrhythmias Atrial Fibrillation  Rhythm:Irregular     Neuro/Psych negative neurological ROS  negative psych ROS   GI/Hepatic Neg liver ROS,GERD  ,,  Endo/Other  negative endocrine ROS    Renal/GU CRFRenal disease     Musculoskeletal   Abdominal   Peds  Hematology  (+) Blood dyscrasia, anemia   Anesthesia Other Findings   Reproductive/Obstetrics                              Anesthesia Physical Anesthesia Plan  ASA: 3  Anesthesia Plan: General   Post-op Pain Management:    Induction: Intravenous  PONV Risk Score and Plan: 2 and Treatment may vary due to age or medical condition and Propofol infusion  Airway Management Planned: Natural Airway and Nasal Cannula  Additional Equipment: None  Intra-op Plan:   Post-operative Plan:   Informed Consent: I have reviewed the patients History and Physical, chart, labs and discussed the procedure including the risks, benefits and alternatives for the proposed anesthesia with the patient or authorized representative who has indicated his/her understanding and acceptance.   Patient has DNR.  Discussed DNR with power of attorney and Continue DNR.   Dental advisory given  Plan Discussed with: CRNA  Anesthesia Plan Comments:         Anesthesia Quick Evaluation

## 2022-06-21 NOTE — Evaluation (Signed)
Physical Therapy Assessment and Plan  Patient Details  Name: Glenn Olson MRN: [id number] Date of Birth: 1940-02-13  PT Diagnosis: Abnormal posture, Abnormality of gait, Difficulty walking, and Muscle weakness Rehab Potential: Good ELOS: 7 days   Today's Date: 06/21/2022 PT Individual Time: 0830-0940 PT Individual Time Calculation (min): 70 min    Hospital Problem: Principal Problem:   Debility   Past Medical History:  Past Medical History:  Diagnosis Date   Anemia    Chronic kidney disease    Dysrhythmia    GERD (gastroesophageal reflux disease)    Neurogenic bladder    Past Surgical History:  Past Surgical History:  Procedure Laterality Date   AV FISTULA PLACEMENT Left 09/27/2020   Procedure: LEFT ARM ARTERIOVENOUS GRAFT PLACEMENT USING GORE-TEX 4-7MM X 45CM  VASCULAR GRAFT;  Surgeon: Larina Earthly, MD;  Location: [facility];  Service: Vascular;  Laterality: Left;   CAPD INSERTION N/A 03/17/2021   Procedure: ATTEMPTED INSERTION OF LAPAROSCOPIC CONTINUOUS AMBULATORY PERITONEAL DIALYSIS (CAPD) CATHETER;  Surgeon: Maeola Harman, MD;  Location: [facility] OR;  Service: Vascular;  Laterality: N/A;   CARDIOVERSION N/A 06/19/2022   Procedure: CARDIOVERSION;  Surgeon: Sande Rives, MD;  Location: [facility] INVASIVE CV LAB;  Service: Cardiovascular;  Laterality: N/A;   CYSTOURETHROSCOPY     INSERTION OF DIALYSIS CATHETER Right 09/27/2020   Procedure: INSERTION OF PALINDROME TUNNELED DIALYSIS CATHETER 14.7f X 23CM;  Surgeon: Larina Earthly, MD;  Location: [facility];  Service: Vascular;  Laterality: Right;   INSERTION OF DIALYSIS CATHETER Right 05/31/2022   Procedure: INSERTION OF DIALYSIS CATHETER;  Surgeon: Victorino Sparrow, MD;  Location: [facility] OR;  Service: Vascular;  Laterality: Right;   IR REMOVAL TUN CV CATH W/O FL  06/20/2022   REMOVAL OF A DIALYSIS CATHETER N/A 12/06/2020   Procedure: MINOR REMOVAL OF A DIALYSIS CATHETER;  Surgeon: Larina Earthly, MD;  Location: [facility];  Service:  Vascular;  Laterality: N/A;   SUPRAPUBIC CATHETER PLACEMENT     TEE WITHOUT CARDIOVERSION N/A 06/19/2022   Procedure: TRANSESOPHAGEAL ECHOCARDIOGRAM;  Surgeon: Sande Rives, MD;  Location: [facility] INVASIVE CV LAB;  Service: Cardiovascular;  Laterality: N/A;    Assessment & Plan Clinical Impression: Patient is a 82 y.o. year old male with history of ESRD w/anemia, neurogenic bladder s/p SPC who quit meds and HD about 14 months ago. He was admitted on 05/30/22 with one week history of headache, difficulty breathing with activity, low energy level, chest pressure as well as lack of urine production. Family also reported progressive decline in activity for a months with decrease in ambulation. He was found to be uremic with BUN 241, Hgb 8.6, Ca-6.0, CO2< 7,  trops 48, SCr- 13.8 and increased crowding of LLL markings. Dr. Ronalee Belts consulted and recommended fluid bolus, HD cath placement and sodium bicarb. CP pain felt to be due to marked uremia as EKG without ST changes and pain improved with morphine. He was transferred to [facility] and R-IJ placed by Dr. Verl Blalock. HD initiated and tolerance of procedure improving with decrease of fatigue and improvement in mentation. Marland Kitchen    Hospital course significant for onset of A fib with RVR with HR 110-160 on 05/07. He was started on IV heparin and po diltiazem for rate control. 2 D echo done revealing EF 55-60% with no wall abnormality, mild concentric LVH and moderately dilated LA. He was transitioned to [place]. He  had recurrent issues with flutter on 05/17 and due to difficulty in rate control; underwent  DCCV to NSR on 05/21 by Dr. Flora Lipps.  Anemia of chronic disease treated with IV iron and aranesp added.  Therapy has been working with patient who was noted to have weakness with fatigue due to  debility. CIR was recommended due to recent functional decline.       PD not an option due to adhesions. Has a seat at [facility] TTS @ 6:10 am. Currently tachycardic.   Patient  currently requires min with mobility secondary to muscle weakness, decreased cardiorespiratoy endurance, and decreased standing balance, decreased postural control, and decreased balance strategies.  Prior to hospitalization, patient was modified independent  with mobility and lived with Son, Other (Comment) (Daughter-in-law) in a House home.  Home access is  Level entry.  Patient will benefit from skilled PT intervention to maximize safe functional mobility, minimize fall risk, and decrease caregiver burden for planned discharge home with intermittent assist.  Anticipate patient will benefit from follow up [facility] at discharge.  PT - End of Session Activity Tolerance: Tolerates 30+ min activity with multiple rests Endurance Deficit: Yes Endurance Deficit Description: SOB and increased HR noted with exertion. Rest breaks provided during evaluation PT Assessment Rehab Potential (ACUTE/IP ONLY): Good PT Barriers to Discharge: Home environment access/layout;Decreased caregiver support;Hemodialysis PT Barriers to Discharge Comments: split level home, son and daughter in law work, hemodialysis, decreased insight into deficits PT Patient demonstrates impairments in the following area(s): Balance;Edema;Endurance;Nutrition;Safety PT Transfers Functional Problem(s): Bed Mobility;Bed to Chair;Car;Furniture PT Locomotion Functional Problem(s): Ambulation;Wheelchair Mobility;Stairs PT Plan PT Intensity: Minimum of 1-2 x/day ,45 to 90 minutes PT Frequency: 5 out of 7 days PT Duration Estimated Length of Stay: 7 days PT Treatment/Interventions: Ambulation/gait training;Discharge planning;Functional mobility training;Therapeutic Activities;Psychosocial support;Visual/perceptual remediation/compensation;Balance/vestibular training;Disease management/prevention;Neuromuscular re-education;Skin care/wound management;Therapeutic Exercise;Wheelchair propulsion/positioning;DME/adaptive equipment instruction;Pain  management;UE/LE Strength taining/ROM;Community reintegration;Patient/family education;Stair training;UE/LE Coordination activities PT Transfers Anticipated Outcome(s): Mod I with LRAD PT Locomotion Anticipated Outcome(s): Mod I with LRAD PT Recommendation Follow Up Recommendations: Home health PT Patient destination: Home Equipment Recommended: To be determined Equipment Details: has RW   PT Evaluation Precautions/Restrictions Precautions Precautions: Fall Precaution Comments: watch HR Restrictions Weight Bearing Restrictions: No Pain Interference Pain Interference Pain Effect on Sleep: 0. Does not apply - I have not had any pain or hurting in the past 5 days Pain Interference with Therapy Activities: 0. Does not apply - I have not received rehabilitationtherapy in the past 5 days Pain Interference with Day-to-Day Activities: 1. Rarely or not at all Home Living/Prior Functioning Home Living Available Help at Discharge: Family;Available PRN/intermittently (family works first shift) Type of Home: House Home Access: Level entry Home Layout: Multi-level Alternate Level Stairs-Number of Steps: split level home. Level entry from the side, then 2 steps to get into living room - no railings. Bedroom and bath are on the main level. 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 - History Ability to See in Adequate Light: 0 Adequate Perception Perception: Within Functional Limits Praxis Praxis: Intact   Cognition Overall Cognitive Status: Within Functional Limits for tasks assessed Arousal/Alertness: Awake/alert Orientation Level: Oriented X4 Memory: Appears intact Awareness: Impaired Problem Solving: Appears intact Comments: slight decreased insight into deficits Sensation Sensation Light Touch: Appears  Intact Hot/Cold: Appears Intact Proprioception: Appears Intact Stereognosis: Appears Intact Coordination Gross Motor Movements are Fluid and Coordinated: Yes Fine Motor Movements are Fluid and Coordinated: Yes Finger Nose Finger Test: WFL bilaterally Heel Shin Test: WFL bilaterally Motor  Motor Motor: Within Functional Limits Motor - Skilled Clinical Observations: generalized weakness/deconditioning  Trunk/Postural Assessment  Cervical Assessment Cervical Assessment: Exceptions to [facility] (forward head) Thoracic Assessment Thoracic Assessment: Exceptions to [facility] (rounded shoulders) Lumbar Assessment Lumbar Assessment: Exceptions to [facility] (posterior pelvic tilt) Postural Control Postural Control: Within Functional Limits  Balance Balance Balance Assessed: Yes Static Sitting Balance Static Sitting - Balance Support: No upper extremity supported;Feet supported Static Sitting - Level of Assistance: 6: Modified independent (Device/Increase time) Dynamic Sitting Balance Dynamic Sitting - Balance Support: No upper extremity supported;Feet supported;During functional activity Dynamic Sitting - Level of Assistance: 5: Stand by assistance (supervision) Dynamic Sitting - Balance Activities: Forward lean/weight shifting;Lateral lean/weight shifting;Reaching for objects Static Standing Balance Static Standing - Balance Support: No upper extremity supported;During functional activity Static Standing - Level of Assistance: 5: Stand by assistance (CGA) Dynamic Standing Balance Dynamic Standing - Balance Support: During functional activity;No upper extremity supported Dynamic Standing -  Level of Assistance: 4: Min assist Dynamic Standing - Comments: with transfers and gait Extremity Assessment  RLE Assessment RLE Assessment: Exceptions to [facility] General Strength Comments: tested sitting EOB - grossly 4-/5 LLE Assessment LLE Assessment: Exceptions to [facility] General Strength Comments: tested sitting EOB - grossly 4-/5  Care Tool Care Tool Bed Mobility Roll left and right activity   Roll left and right assist level: Supervision/Verbal cueing    Sit to lying activity   Sit to lying assist level: Supervision/Verbal cueing    Lying to sitting on side of bed activity   Lying to sitting on side of bed assist level: the ability to move from lying on the back to sitting on the side of the bed with no back support.: Supervision/Verbal cueing     Care Tool Transfers Sit to stand transfer   Sit to stand assist level: Contact Guard/Touching assist    Chair/bed transfer   Chair/bed transfer assist level: Contact Guard/Touching assist     Toilet transfer   Assist Level: Contact Guard/Touching assist    Car transfer   Car transfer assist level: Minimal Assistance - Patient > 75%      Care Tool Locomotion Ambulation   Assist level: Minimal Assistance - Patient > 75% Assistive device: No Device Max distance: 89ft  Walk 10 feet activity   Assist level: Minimal Assistance - Patient > 75% Assistive device: No Device   Walk 50 feet with 2 turns activity   Assist level: Minimal Assistance - Patient > 75% Assistive device: No Device  Walk 150 feet activity Walk 150 feet activity did not occur: Safety/medical concerns (fatigue, decreased balance/coordination)      Walk 10 feet on uneven surfaces activity   Assist level: Minimal Assistance - Patient > 75% Assistive device: Other (comment) (L handrail)  Stairs   Assist level: Minimal Assistance - Patient > 75% Stairs assistive device: 2 hand rails Max number of stairs: 8 (6in)  Walk up/down 1 step activity   Walk up/down 1  step (curb) assist level: Minimal Assistance - Patient > 75% Walk up/down 1 step or curb assistive device: 2 hand rails  Walk up/down 4 steps activity   Walk up/down 4 steps assist level: Minimal Assistance - Patient > 75% Walk up/down 4 steps assistive device: 2 hand rails  Walk  up/down 12 steps activity Walk up/down 12 steps activity did not occur: Safety/medical concerns (fatigue, decreased balance/coordination)      Pick up small objects from floor   Pick up small object from the floor assist level: Minimal Assistance - Patient > 75% Pick up small object from the floor assistive device: tissue box without AD  Wheelchair Is the patient using a wheelchair?: Yes Type of Wheelchair: Manual   Wheelchair assist level: Supervision/Verbal cueing Max wheelchair distance: 166ft  Wheel 50 feet with 2 turns activity   Assist Level: Supervision/Verbal cueing  Wheel 150 feet activity   Assist Level: Supervision/Verbal cueing    Refer to Care Plan for Long Term Goals  SHORT TERM GOAL WEEK 1 PT Short Term Goal 1 (Week 1): STG=LTG due to LOS  Recommendations for other services: None   Skilled Therapeutic Intervention Evaluation completed (see details above and below) with education on PT POC and goals and individual treatment initiated with focus on functional mobility/transfers, generalized strengthening and endurance, dynamic standing balance/coordination, simulated car transfers, stair navigation, and ambulation. Received pt semi-reclined in bed, pt educated on PT evaluation, CIR policies, and therapy schedule and agreeable. Pt denied any pain during session - HR 80bpm and SPO2 99% at rest. Provided pt with 16x16 manual WC and scrub clothing. Pt transferred supine<>sitting R EOB from flat bed using bedrails and supervision. Doffed gown and donned pull over scrub top with supervision and pants with max A to thread catheter through. Stood from EOB without AD and min A to pull pants over hips and  transferred bed<>WC stand<>pivot without AD and min A - HR 85bpm after transfer. Pt performed WC mobility 186ft using BUE and supervision to main therapy gym - emphasis on UE strength and endurance. In ortho gym, pt performed simulated car transfer without AD and min A and ambulated 31ft on uneven surfaces (ramp) with LUE support on railing and min A for balance - HR 117bpm decreasing to 104bpm with seated rest. Pt stood from [facility] without AD and picked up tissue box without AD and min A. In main gym, pt navigated 8 6in steps with bilateral handrails and min A ascending and descending with a step to pattern. MD arrived for morning rounds, then pt stood without AD and min A and ambulated additional 53ft without AD and min A - pt reaching out for R handrail due to feeling "off balance". Pt ambulates at decreased cadence, with narrow BOS, short/shuffling steps, and with flexed trunk/downward gaze. Pt required multiple rest breaks throughout session due to fatigue. Returned to room and discussed trying rollator this afternoon. Pt agreed to try it but stated "I'll tell you right now I won't use it" as pt prefers to use his RW. Concluded session with pt sitting in WC, needs within reach, and seatbelt alarm on awaiting upcoming OT session. Safety plan updated.   Mobility Bed Mobility Bed Mobility: Rolling Right;Supine to Sit Rolling Right: Supervision/verbal cueing Supine to Sit: Supervision/Verbal cueing Transfers Transfers: Sit to Stand;Stand to Sit;Stand Pivot Transfers Sit to Stand: Minimal Assistance - Patient > 75% Stand to Sit: Contact Guard/Touching assist Stand Pivot Transfers: Minimal Assistance - Patient > 75% Transfer (Assistive device): None Locomotion  Gait Ambulation: Yes Gait Assistance: Minimal Assistance - Patient > 75% Gait Distance (Feet): 98 Feet Assistive device: None Gait Gait: Yes Gait Pattern: Impaired Gait Pattern: Step-to pattern;Decreased step length - right;Decreased step  length - left;Decreased stride length;Shuffle;Trunk flexed;Poor foot clearance - left;Poor foot clearance - right;Narrow base  of support Gait velocity: decreased Stairs / Additional Locomotion Stairs: Yes Stairs Assistance: Minimal Assistance - Patient > 75% Stair Management Technique: Two rails Number of Stairs: 8 Height of Stairs: 6 Ramp: Minimal Assistance - Patient >75% (L railing) Wheelchair Mobility Wheelchair Mobility: Yes Wheelchair Assistance: Doctor, general practice: Both upper extremities Wheelchair Parts Management: Needs assistance Distance: 131ft   Discharge Criteria: Patient will be discharged from PT if patient refuses treatment 3 consecutive times without medical reason, if treatment goals not met, if there is a change in medical status, if patient makes no progress towards goals or if patient is discharged from hospital.  The above assessment, treatment plan, treatment alternatives and goals were discussed and mutually agreed upon: by patient  Huntley Dec PT, DPT 06/21/2022, 12:18 PM

## 2022-06-21 NOTE — Progress Notes (Signed)
Physical Therapy Session Note  Patient Details  Name: Glenn Olson MRN: [id number] Date of Birth: 05-08-1940  Today's Date: 06/21/2022 PT Individual Time: 1400-1453 PT Individual Time Calculation (min): 53 min  Today's Date: 06/21/2022 PT Missed Time: 7 Minutes Missed Time Reason: Other (Comment) (laboratory)  Short Term Goals: Week 1:  PT Short Term Goal 1 (Week 1): STG=LTG due to LOS  Skilled Therapeutic Interventions/Progress Updates:   Received pt semi-reclined in bed, pt agreeable to PT treatment, and denied any pain during session. Session with emphasis on functional mobility/transfers, generalized strengthening and endurance, dynamic standing balance/coordination, NMR, and gait training. Pt performed bed mobility with HOB elevated and use of bedrails with supervision x 2 trials throughout session and transferred bed<>WC stand<>pivot without AD and min A. Pt transported to/from room in [facility] dependently for time management purposes. Stood with RW and CGA and ambulated to [employer] and performed BUE/LE strengthening on Nustep at workload 4 for 8 minutes for a total of 541 steps with emphasis on cardiovascular endurance - HR reaching 81bpm. Pt required 3 rest breaks due to fatigue stating, "this is harder than I thought". Stood with RW and CGA and ambulated 272ft with RW and CGA with 1 standing rest break - HR 101bpm after ambulating. Pt then performed altrenating toe taps to 6in step with mod HHA 2x10 to fatigue. Transitioned to toe taps to 3 cones x 3 reps bilaterally with BUE support on RW and CGA for balance. Pt then performed blocked practice sit<>stands from 23in high mat x 5 reps with min A - challenged pt without using UE support, however pt unable (mostly due to fear), ultimately needing to push up with BUE support on knees. Transferred back into WC with RW and CGA and returned to room. Laboratory present and pt requested to return to bed. Stood from [facility] with RW and CGA and ambulated back to  bed. Concluded session with pt semi-reclined in bed, needs within reach, and bed alarm on. 7 minutes missed of skilled physical therapy due to laboratory drawing blood.   Therapy Documentation Precautions:  Precautions Precautions: Fall Precaution Comments: watch HR Restrictions Weight Bearing Restrictions: No  Therapy/Group: Individual Therapy Marlana [employer]age Zaunegger Blima Rich PT, DPT 06/21/2022, 7:14 AM

## 2022-06-21 NOTE — Anesthesia Postprocedure Evaluation (Signed)
Anesthesia Post Note  Patient: Jeffey Aplin  Procedure(s) Performed: TRANSESOPHAGEAL ECHOCARDIOGRAM CARDIOVERSION     Patient location during evaluation: Cath Lab Anesthesia Type: General Level of consciousness: awake and patient cooperative Pain management: pain level controlled Vital Signs Assessment: post-procedure vital signs reviewed and stable Respiratory status: spontaneous breathing, nonlabored ventilation and respiratory function stable Cardiovascular status: stable Postop Assessment: no apparent nausea or vomiting Anesthetic complications: no   No notable events documented.  Last Vitals:  Vitals:   06/20/22 1239 06/20/22 1300  BP: 118/81 127/78  Pulse: 80 (!) 108  Resp: 20   Temp:    SpO2: 98% 97%    Last Pain:  Vitals:   06/20/22 1237  TempSrc:   PainSc: 0-No pain                 Braelynn Lupton

## 2022-06-21 NOTE — Progress Notes (Signed)
Inpatient Rehabilitation  Patient information reviewed and entered into eRehab system by [employer] M. Zaray Gatchel, M.A., CCC/SLP, PPS Coordinator.  Information including medical coding, functional ability and quality indicators will be reviewed and updated through discharge.    

## 2022-06-21 NOTE — Progress Notes (Signed)
Inpatient Rehabilitation Admission Medication Review by a Pharmacist  A complete drug regimen review was completed for this patient to identify any potential clinically significant medication issues.  High Risk Drug Classes Is patient taking? Indication by Medication  Antipsychotic Yes PRN prochlorperazine - nausea/vomiting  Anticoagulant Yes Apixaban - atrial fibrillation  Antibiotic No   Opioid No   Antiplatelet No   Hypoglycemics/insulin No   Vasoactive Medication Yes Diltiazem CD - atrial fibrillation - and prn Diltiazem 30 mg PO QID for HR > 120  Chemotherapy No   Other Yes Calcitriol - secondary hyperparathyroidism Calcium acetate - hyperphosphatemia Darbepoetin - anemia due to ESRD Loratadine - seasonal allergies  PRNs: Acetaminophen - mile pain Bisacodyl, miralax - laxatives Diphenhydramine - itching Guaifenesin/dextromethorphan - cough Melatonin - sleep     Type of Medication Issue Identified Description of Issue Recommendation(s)  Drug Interaction(s) (clinically significant)     Duplicate Therapy     Allergy     No Medication Administration End Date     Incorrect Dose     Additional Drug Therapy Needed     Significant med changes from prior encounter (inform family/care partners about these prior to discharge). All meds are new from inpatient admit.   Other       Clinically significant medication issues were identified that warrant physician communication and completion of prescribed/recommended actions by midnight of the next day:  No  Pharmacist comments:  - per benefits check for Apixaban (on 06/06/22), co-pay is $43. Insured thru [place]. [facility] is the only [employer] that takes [employer]. - Can use [facility] for 1st 30 day free Rx  Time spent performing this drug regimen review (minutes):  [address]   Dennie Fetters, [place] 06/21/2022 8:36 AM

## 2022-06-21 NOTE — Evaluation (Signed)
Occupational Therapy Assessment and Plan  Patient Details  Name: Glenn Olson MRN: [id number] Date of Birth: 11/01/1940  OT Diagnosis: muscle weakness (generalized) Rehab Potential: Rehab Potential (ACUTE ONLY): Excellent ELOS: 5-7 days   Today's Date: 06/21/2022 OT Individual Time: 0950-1100 OT Individual Time Calculation (min): 70 min     Hospital Problem: Principal Problem:   Debility   Past Medical History:  Past Medical History:  Diagnosis Date   Anemia    Chronic kidney disease    Dysrhythmia    GERD (gastroesophageal reflux disease)    Neurogenic bladder    Past Surgical History:  Past Surgical History:  Procedure Laterality Date   AV FISTULA PLACEMENT Left 09/27/2020   Procedure: LEFT ARM ARTERIOVENOUS GRAFT PLACEMENT USING GORE-TEX 4-7MM X 45CM  VASCULAR GRAFT;  Surgeon: Larina Earthly, MD;  Location: [facility];  Service: Vascular;  Laterality: Left;   CAPD INSERTION N/A 03/17/2021   Procedure: ATTEMPTED INSERTION OF LAPAROSCOPIC CONTINUOUS AMBULATORY PERITONEAL DIALYSIS (CAPD) CATHETER;  Surgeon: Maeola Harman, MD;  Location: [facility] OR;  Service: Vascular;  Laterality: N/A;   CARDIOVERSION N/A 06/19/2022   Procedure: CARDIOVERSION;  Surgeon: Sande Rives, MD;  Location: [facility] INVASIVE CV LAB;  Service: Cardiovascular;  Laterality: N/A;   CYSTOURETHROSCOPY     INSERTION OF DIALYSIS CATHETER Right 09/27/2020   Procedure: INSERTION OF PALINDROME TUNNELED DIALYSIS CATHETER 14.51f X 23CM;  Surgeon: Larina Earthly, MD;  Location: [facility];  Service: Vascular;  Laterality: Right;   INSERTION OF DIALYSIS CATHETER Right 05/31/2022   Procedure: INSERTION OF DIALYSIS CATHETER;  Surgeon: Victorino Sparrow, MD;  Location: [facility] OR;  Service: Vascular;  Laterality: Right;   IR REMOVAL TUN CV CATH W/O FL  06/20/2022   REMOVAL OF A DIALYSIS CATHETER N/A 12/06/2020   Procedure: MINOR REMOVAL OF A DIALYSIS CATHETER;  Surgeon: Larina Earthly, MD;  Location: [facility];  Service: Vascular;   Laterality: N/A;   SUPRAPUBIC CATHETER PLACEMENT     TEE WITHOUT CARDIOVERSION N/A 06/19/2022   Procedure: TRANSESOPHAGEAL ECHOCARDIOGRAM;  Surgeon: Sande Rives, MD;  Location: [facility] INVASIVE CV LAB;  Service: Cardiovascular;  Laterality: N/A;    Assessment & Plan Clinical Impression: Patient is a 82 year old male with history of ESRD w/anemia, neurogenic bladder s/p SPC who quit meds and HD about 14 months ago. He was admitted on 05/30/22 with one week history of headache, difficulty breathing with activity, low energy level, chest pressure as well as lack of urine production. Family also reported progressive decline in activity for a months with decrease in ambulation.  Patient transferred to CIR on 06/20/2022 .    Patient currently requires  SBA-Min guard  with basic self-care skills secondary to muscle weakness and decreased standing balance, decreased postural control, and decreased balance strategies.  Prior to hospitalization, patient could complete all BADL tasks with modified independent .  Patient will benefit from skilled intervention to increase independence with basic self-care skills prior to discharge home with care partner.  Anticipate patient will require intermittent supervision and follow up home health.  OT - End of Session Activity Tolerance: Decreased this session Endurance Deficit: Yes Endurance Deficit Description: SOB and increased HR noted with exertion. Rest breaks provided during evaluation OT Assessment Rehab Potential (ACUTE ONLY): Excellent OT Barriers to Discharge: Neurogenic Bowel & Bladder;Lack of/limited family support OT Barriers to Discharge Comments: Lives with daughter and son-in-law who work first shift and are gone during the day. OT Patient demonstrates impairments in the following  area(s): Balance;Motor OT Basic ADL's Functional Problem(s): Bathing;Grooming;Dressing;Toileting OT Transfers Functional Problem(s): Toilet;Tub/Shower OT Plan OT  Intensity: Minimum of 1-2 x/day, 45 to 90 minutes OT Frequency: 5 out of 7 days OT Duration/Estimated Length of Stay: 5-7 days OT Treatment/Interventions: Balance/vestibular training;Neuromuscular re-education;Self Care/advanced ADL retraining;Therapeutic Exercise;DME/adaptive equipment instruction;UE/LE Strength taining/ROM;UE/LE Coordination activities;Patient/family education;Discharge planning;Functional mobility training;Therapeutic Activities OT Basic Self-Care Anticipated Outcome(s): Mod I OT Toileting Anticipated Outcome(s): Mod I OT Bathroom Transfers Anticipated Outcome(s): Mod I OT Recommendation Patient destination: Home Follow Up Recommendations: Home health OT Equipment Recommended: To be determined Equipment Details: RW, cane, Possible shower (check with son-in-law) grab bars (shower and toilet)   OT Evaluation Precautions/Restrictions  Precautions Precautions: Fall Precaution Comments: watch HR Restrictions Weight Bearing Restrictions: No  Pain Pain Assessment Pain Scale: 0-10 Pain Score: 0-No pain Home Living/Prior Functioning Home Living Family/patient expects to be discharged to:: Private residence Living Arrangements: Children Available Help at Discharge: Family, Available PRN/intermittently (family works first shift) Type of Home: House Home Access: Level entry Home Layout: Multi-level Alternate Level Stairs-Number of Steps: split level home. Level entry from the side, then 2 steps to get into living room - no railings. Bedroom and bath are on the main level. 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 Leisure and Hobbies: enjoys reading Prior Function Level of Independence: Requires assistive device for independence  Able to Take Stairs?: Yes Driving: No Vocation: Retired Gaffer: worked in Engineer, civil (consulting) Baseline Vision/History: 1 Wears glasses Ability to See in Adequate Light: 0 Adequate Patient Visual Report: No change from baseline Vision Assessment?: No apparent visual deficits Perception  Perception: Within Functional Limits Praxis Praxis: Intact Cognition Brief Interview for Mental Status (BIMS) Repetition of Three Words (First Attempt): 3 Temporal Orientation: Year: Correct Temporal Orientation: Month: Accurate within 5 days Temporal Orientation: Day: Correct Recall: "Sock": Yes, no cue required Recall: "Blue": Yes, no cue required Recall: "Bed": Yes, no cue required BIMS Summary Score: 15 Sensation Sensation Light Touch: Appears Intact Hot/Cold: Appears Intact Proprioception: Appears Intact Stereognosis: Appears Intact Coordination Gross Motor Movements are Fluid and Coordinated: Yes Fine Motor Movements are Fluid and Coordinated: Yes Finger Nose Finger Test: WFL bilaterally Heel Shin Test: [facility] bilaterally Motor    WFL Trunk/Postural Assessment  Cervical Assessment Cervical Assessment: Exceptions to [facility] (forward head) Thoracic Assessment Thoracic Assessment: Exceptions to [facility] (rounded shoulders) Lumbar Assessment Lumbar Assessment: Exceptions to [facility] (posterior pelvic tilt) Postural Control Postural Control: Deficits on evaluation (impaired)  Balance Balance Balance Assessed: Yes Static Sitting Balance Static Sitting - Balance Support: No upper extremity supported;Feet supported Static Sitting - Level of Assistance: 6: Modified independent (Device/Increase time) Dynamic Sitting Balance Dynamic Sitting - Balance Support: No upper extremity supported;Feet supported;During functional activity Dynamic Sitting - Level of  Assistance: 5: Stand by assistance Dynamic Sitting - Balance Activities: Forward lean/weight shifting;Lateral lean/weight shifting;Reaching for objects Static Standing Balance Static Standing - Balance Support: Bilateral upper extremity supported;During functional activity Static Standing - Level of Assistance: 5: Stand by assistance (reliant on external support with at least one UE) Dynamic Standing Balance Dynamic Standing - Balance Support: Bilateral upper extremity supported;During functional activity Dynamic Standing - Level of Assistance: 4: Min assist Extremity/Trunk Assessment RUE Assessment RUE Assessment: Within Functional Limits General Strength Comments:  4+/5 strength shoulder, elbow, wrist in all ranges. LUE Assessment LUE Assessment: Within Functional Limits General Strength Comments: 4+/5 strength shoulder, elbow, wrist in all ranges.  Care Tool Care Tool Self Care Eating   Eating Assist Level: Independent with assistive device Eating Assistive Level Comment: dentures  Oral Care    Oral Care Assist Level: Set up assist    Bathing   Body parts bathed by patient: Right arm;Left arm;Chest;Abdomen;Front perineal area;Buttocks;Right upper leg;Left upper leg;Right lower leg;Left lower leg;Face     Assist Level: Supervision/Verbal cueing    Upper Body Dressing(including orthotics)   What is the patient wearing?: Pull over shirt   Assist Level: Set up assist    Lower Body Dressing (excluding footwear)   What is the patient wearing?: Pants;Incontinence brief Assist for lower body dressing: Minimal Assistance - Patient > 75%    Putting on/Taking off footwear   What is the patient wearing?: Non-skid slipper socks Assist for footwear: Supervision/Verbal cueing       Care Tool Toileting Toileting activity   Assist for toileting: Minimal Assistance - Patient > 75%     Care Tool Bed Mobility    Sit to lying activity   Sit to lying assist level: Supervision/Verbal  cueing        Care Tool Transfers Sit to stand transfer   Sit to stand assist level: Contact Guard/Touching assist    Chair/bed transfer   Chair/bed transfer assist level: Contact Guard/Touching assist     Toilet transfer   Assist Level: Contact Guard/Touching assist     Care Tool Cognition  Expression of Ideas and Wants Expression of Ideas and Wants: 4. Without difficulty (complex and basic) - expresses complex messages without difficulty and with speech that is clear and easy to understand  Understanding Verbal and Non-Verbal Content Understanding Verbal and Non-Verbal Content: 4. Understands (complex and basic) - clear comprehension without cues or repetitions   Memory/Recall Ability Memory/Recall Ability : That he or she is in a hospital/hospital unit   Refer to Care Plan for Long Term Goals  SHORT TERM GOAL WEEK 1 OT Short Term Goal 1 (Week 1): STG = LTG d/t ELOS  Recommendations for other services: None    Skilled Therapeutic Intervention ADL ADL Eating: Modified independent Where Assessed-Eating: Wheelchair Grooming: Setup Where Assessed-Grooming: Sitting at sink Upper Body Bathing: Supervision/safety;Setup Where Assessed-Upper Body Bathing: Shower Lower Body Bathing: Setup Where Assessed-Lower Body Bathing: Sitting at sink Upper Body Dressing: Setup Where Assessed-Upper Body Dressing: Sitting at sink Lower Body Dressing: Setup Where Assessed-Lower Body Dressing: Sitting at sink Toileting: Minimal assistance Where Assessed-Toileting: Teacher, adult education: Furniture conservator/restorer Method: Proofreader: Bedside commode;Grab bars Tub/Shower Transfer: Scientific laboratory technician Method: Ship broker: Transfer tub bench;Grab bars;Walk in shower Mobility  Bed Mobility Bed Mobility:  (Pt sitting up in wheelchair upon therapy arrival.) Transfers Sit to Stand: Contact Guard/Touching assist Stand to Sit:  Contact Guard/Touching assist   Discharge Criteria: Patient will be discharged from OT if patient refuses treatment 3 consecutive times without medical reason, if treatment goals not met, if there is a change in medical status, if patient makes no progress towards goals or if patient is discharged from hospital.  The above assessment, treatment plan, treatment alternatives and goals were discussed and mutually agreed upon: by patient Limmie Patricia, OTR/L,CBIS  Supplemental OT - [facility] and [facility] Secure Chat Preferred   06/21/2022, 11:03 AM

## 2022-06-21 NOTE — Progress Notes (Signed)
[facility] Progress Note   Assessment/ Plan:   1) ESRD: History of such and was off dialysis for some time. Looked into PD but not an option given adhesions. Now ESRD again and agreeing to dialysis -Continue with dialysis MWF -Outpatient dialysis arranged at [facility] but waiting for formal discharge plan given possibly going to CIR -[facility] in place with [facility].  Appreciate help -used AVG 5/15, 5/17..5/22    Plan next HD for Friday  Appreciate RIJ TC removal by VIR on 5/22; graft has been used over 4x already successfully.  Has seat at [facility] but now in CIR.  2) Atrial fibrillation with RVR: TEE guided DCCV 5/21 CV x1 200J biphasic synchronized -> NSR, on Eliquis   3) Anemia of CKD: Persistently low hemoglobin.  Iron depleted and IV iron as ordered.  Increase ESA due on Friday.   4) Hypertension: Blood pressure acceptable on current medications.   5) Secondary hyperparathyroidism: With hypocalcemia and hyperphosphatemia.  On PhosLo.  Started calcitriol 0.5 mcg daily.  Ca and phos much improved. Decrease phoslo. Continue to monitor  Subjective:    Denies f/c/n/v/sob; no cramping with HD yest.    Objective:   BP 99/67 (BP Location: Right Arm)   Pulse 98   Temp 97.8 F (36.6 C)   Resp 17   Ht 5\' 8"  (1.727 m)   Wt 72.7 kg   SpO2 96%   BMI 24.37 kg/m   Intake/Output Summary (Last 24 hours) at 06/21/2022 1610 Last data filed at 06/21/2022 9604 Gross per 24 hour  Intake 807 ml  Output 500 ml  Net 307 ml   Weight change:   Physical Exam: [id number] appearing CVS: regular rate rhythm Resp: clear Abd: soft Ext: no LE edema ACCESS: Lt FAL with good bruit  Imaging: IR Removal Tun Cv Cath W/O FL  Result Date: 06/20/2022 INDICATION: Patient with tunneled hemodialysis catheter placed 05/31/2022 by Vascular. Patient now has working fistula. Interventional radiology asked to remove tunneled dialysis catheter. EXAM: REMOVAL TUNNELED CENTRAL VENOUS CATHETER  MEDICATIONS: None ANESTHESIA/SEDATION: None FLUOROSCOPY: None COMPLICATIONS: None immediate. PROCEDURE: Informed written consent was obtained from the patient after a thorough discussion of the procedural risks, benefits and alternatives. All questions were addressed. Maximal Sterile Barrier Technique was utilized including caps, mask, sterile gowns, sterile gloves, sterile drape, hand hygiene and skin antiseptic. A timeout was performed prior to the initiation of the procedure. The patient's right chest and catheter was prepped and draped in a normal sterile fashion. Heparin was removed from both ports of catheter. Using gentle manual traction the cuff of the catheter was exposed and the catheter was removed in it's entirety. Pressure was held till hemostasis was obtained. A sterile dressing was applied. The patient tolerated the procedure well with no immediate complications. IMPRESSION: Successful catheter removal as described above. Procedure performed by Alwyn Ren NP Electronically Signed   By: Gilmer Mor D.O.   On: 06/20/2022 16:09   ECHO TEE  Result Date: 06/19/2022    TRANSESOPHOGEAL ECHO REPORT   Patient Name:   KWINTON SCHOEPP Date of Exam: 06/19/2022 Medical Rec #:  [id number]      Height:       69.0 in Accession #:    [id number]     Weight:       162.5 lb Date of Birth:  03-03-1940      BSA:          1.891 m Patient Age:    82 years  BP:           132/118 mmHg Patient Gender: M              HR:           76 bpm. Exam Location:  Inpatient Procedure: Transesophageal Echo, Cardiac Doppler and Color Doppler Indications:     I48.91* Unspeicified atrial fibrillation  History:         Patient has prior history of Echocardiogram examinations, most                  recent 06/05/2022. Abnormal ECG, Arrythmias:Atrial Fibrillation                  and Atrial Flutter; Signs/Symptoms:Chest Pain.  Sonographer:     Sheralyn Boatman RDCS Referring Phys:  [id number] Jonita Albee Diagnosing Phys: Lennie Odor MD  PROCEDURE: After discussion of the risks and benefits of a TEE, an informed consent was obtained from the patient. TEE procedure time was 10 minutes. The transesophogeal probe was passed without difficulty through the esophogus of the patient. Imaged were obtained with the patient in a left lateral decubitus position. Sedation performed by different physician. The patient was monitored while under deep sedation. Anesthestetic sedation was provided intravenously by Anesthesiology: 150mg  of Propofol, 60mg  of Lidocaine. Image quality was excellent. The patient's vital signs; including heart rate, blood pressure, and oxygen saturation; remained stable throughout the procedure. The patient developed no complications during the procedure. A successful direct current cardioversion was performed at 200 joules with 1 attempt.  IMPRESSIONS  1. Left ventricular ejection fraction, by estimation, is 50 to 55%. The left ventricle has low normal function. The left ventricle has no regional wall motion abnormalities.  2. Right ventricular systolic function is normal. The right ventricular size is normal.  3. Left atrial size was mild to moderately dilated. No left atrial/left atrial appendage thrombus was detected. The LAA emptying velocity was 39 cm/s.  4. Right atrial size was mildly dilated.  5. The mitral valve is grossly normal. Mild mitral valve regurgitation. No evidence of mitral stenosis.  6. Tricuspid valve regurgitation is mild to moderate.  7. The aortic valve is tricuspid. Aortic valve regurgitation is mild. No aortic stenosis is present.  8. 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 Conclusion(s)/Recommendation(s): No LA/LAA thrombus identified. Successful cardioversion performed with restoration of normal sinus rhythm. FINDINGS  Left Ventricle: Left ventricular ejection fraction, by estimation, is 50 to 55%. The left ventricle has low normal function. The left ventricle has no regional  wall motion abnormalities. The left ventricular internal cavity size was normal in size. Right Ventricle: The right ventricular size is normal. No increase in right ventricular wall thickness. Right ventricular systolic function is normal. Left Atrium: Left atrial size was mild to moderately dilated. No left atrial/left atrial appendage thrombus was detected. The LAA emptying velocity was 39 cm/s. Right Atrium: Right atrial size was mildly dilated. Pericardium: There is no evidence of pericardial effusion. Mitral Valve: The mitral valve is grossly normal. Mild mitral valve regurgitation. No evidence of mitral valve stenosis. Tricuspid Valve: The tricuspid valve is normal in structure. Tricuspid valve regurgitation is mild to moderate. Aortic Valve: The aortic valve is tricuspid. Aortic valve regurgitation is mild. No aortic stenosis is present. Pulmonic Valve: The pulmonic valve was grossly normal. Pulmonic valve regurgitation is not visualized. No evidence of pulmonic stenosis. Aorta: The aortic root and ascending aorta are structurally normal, with no evidence of  dilitation. There is severe (Grade IV) layered plaque involving the descending aorta. Venous: The left upper pulmonary vein is normal. IAS/Shunts: No atrial level shunt detected by color flow Doppler. Additional Comments: Spectral Doppler performed.  AORTA Ao Root diam: 3.57 cm Lennie Odor MD Electronically signed by Lennie Odor MD Signature Date/Time: 06/19/2022/10:23:07 AM    Final    EP STUDY  Result Date: 06/19/2022 See surgical note for result.   Labs: BMET Recent Labs  Lab 06/15/22 0855 06/19/22 0843 06/20/22 0753  NA 135 136 133*  K 4.7 4.3 4.5  CL 99 98 98  CO2 25  --  20*  GLUCOSE 90 93 94  BUN 55* 56* 84*  CREATININE 5.91* 5.30* 6.42*  CALCIUM 8.6*  --  7.7*  PHOS 4.9*  --  6.9*   CBC Recent Labs  Lab 06/15/22 0202 06/19/22 0843 06/20/22 0753  WBC 6.5  --  5.8  HGB 8.0* 8.5* 7.6*  HCT 26.0* 25.0* 24.5*  MCV  95.6  --  96.8  PLT 231  --  260    Medications:     apixaban  2.5 mg Oral BID   calcitRIOL  0.5 mcg Oral Daily   calcium acetate  667 mg Oral TID WC   Chlorhexidine Gluconate Cloth  6 each Topical Q0600   [START ON 06/22/2022] darbepoetin (ARANESP) injection - DIALYSIS  100 mcg Subcutaneous Q Fri-1800   diltiazem  300 mg Oral Daily   feeding supplement (NEPRO CARB STEADY)  237 mL Oral BID BM   loratadine  10 mg Oral Daily

## 2022-06-22 DIAGNOSIS — R7989 Other specified abnormal findings of blood chemistry: Secondary | ICD-10-CM

## 2022-06-22 LAB — HEPATITIS B SURFACE ANTIBODY, QUANTITATIVE: Hep B S AB Quant (Post): 11.9 m[IU]/mL (ref >9.9)

## 2022-06-22 MED ORDER — VITAMIN D 25 MCG (1000 UNIT) PO TABS
1000.0000 [IU] | ORAL_TABLET | Freq: Every day | ORAL | Status: DC
  Administered 2022-06-22: 1000 [IU] via ORAL
  Filled 2022-06-22: qty 1

## 2022-06-22 MED ORDER — VITAMIN D 25 MCG (1000 UNIT) PO TABS
1000.0000 [IU] | ORAL_TABLET | Freq: Every day | ORAL | Status: DC
  Administered 2022-06-23 – 2022-06-28 (×6): 1000 [IU] via ORAL
  Filled 2022-06-22 (×6): qty 1

## 2022-06-22 NOTE — Procedures (Signed)
HD Note:  Some information was entered later than the data was gathered due to patient care needs. The stated time with the data is accurate  .Received patient in bed to unit.  Alert and oriented. Patient is hard of hearing.  Was able to understand what was being said Informed consent signed and in chart.   TX duration:4 hours  Patient tolerated well.  Transported back to the room  Alert, without acute distress.  Hand-off given to patient's nurse.   Access used: Left lower arm graft Access issues: No issues.  Graft without issue as well  Total UF removed: 1900    [facility]

## 2022-06-22 NOTE — Progress Notes (Signed)
Occupational Therapy Session Note  Patient Details  Name: Donn Angstadt MRN: [id number] Date of Birth: Apr 04, 1940  {CHL IP REHAB OT TIME CALCULATIONS:[id number]}   Short Term Goals: Week 1:  OT Short Term Goal 1 (Week 1): STG = LTG d/t ELOS  Skilled Therapeutic Interventions/Progress Updates:  Pt received *** for skilled OT session with focus on ***. Pt agreeable to interventions, demonstrating overall *** mood. Pt reported ***/10 pain, stating "***" in reference to ***. OT offering intermediate rest breaks and positioning suggestions throughout session to address pain/fatigue and maximize participation/safety in session.    Pt remained *** with all immediate needs met at end of session. Pt continues to be appropriate for skilled OT intervention to promote further functional independence.   Therapy Documentation Precautions:  Precautions Precautions: Fall Precaution Comments: watch HR Restrictions Weight Bearing Restrictions: No   Therapy/Group: Individual Therapy  Lou Cal, OTR/L, MSOT  06/22/2022, 9:28 PM

## 2022-06-22 NOTE — Progress Notes (Signed)
Occupational Therapy Session Note  Patient Details  Name: Glenn Olson MRN: [id number] Date of Birth: 09-Jan-1941  Today's Date: 06/22/2022 OT Individual Time: 7829-5621 OT Individual Time Calculation (min): 55 min    Short Term Goals: Week 1:  OT Short Term Goal 1 (Week 1): STG = LTG d/t ELOS  Skilled Therapeutic Interventions/Progress Updates:  Skilled OT intervention completed with focus on walk in shower transfers in prep for DC, functional ambulation and endurance. Pt received upright in bed, agreeable to session. No pain reported.  Flat affect noted, however pleasant and cooperative. Able to transition to EOB with supervision. CGA sit > stand using RW then CGA stand pivot to w/c. Stood again with CGA for self-adjustment of brief as it had fallen down into L side of his pants. Nurse in room to administer meds. HR at 70 bpm with rest however with simple mobility would reach as high as 129 bpm. Extended rest breaks needed for fatigue and HR management though not given any parameters to stay within by MD.   Transported dependently in w/c > ADL bathroom. Pt reports his son (who he lives with) is renovating his bathroom from tub to walk in shower and doesn't know any specific details. OT demonstrated side step walk in method using door/shower frame to simulate walk in shower at home. Pt was able to return demo CGA sit > stand using RW, then needed mod cues and min A to side step into shower and for lowering to shower stool. Discussed shower safety and options for energy conservation due to pt's low activity tolerance. Pt did not tolerate sitting on stool but for about 1 min, therefore discussed need for shower chair with back and potential push up rails for ease with sit > stand however need to confirm details with son if he places built in seat. Advised grab bar installation. Required same assist to exit back to w/c. Nurse present to administer additional med for HR.   In hallway, pt ambulated  about 75 ft using RW with CGA, cues needed for upright gaze due to heavy trunk/cervical flexion, back to room to EOB. Transitioned to upright in bed with supervision. Pt remained upright in bed, with bed alarm on/activated, and with all needs in reach at end of session.   Therapy Documentation Precautions:  Precautions Precautions: Fall Precaution Comments: watch HR Restrictions Weight Bearing Restrictions: No    Therapy/Group: Individual Therapy  Melvyn Novas, MS, OTR/L  06/22/2022, 10:13 AM

## 2022-06-22 NOTE — Progress Notes (Signed)
Unable to see patient. In dialysis.

## 2022-06-22 NOTE — Progress Notes (Signed)
[facility] NEPHROLOGY PROGRESS NOTE  Assessment/ Plan: Pt is a 82 y.o. yo male ESRD on HD now in CIR for rehab.  # ESRD: It looks like he was off of dialysis for some time, looked into PD but not an option because of sedation. Now he is getting dialysis MWF schedule.  Outpatient HD arranged at [facility] TTS 6:10 AM. Plan for regular dialysis today.  # Anemia of CKD: Received IV iron and now on ESA.  Monitor hemoglobin.  # Secondary hyperparathyroidism/hyperphosphatemia: On PhosLo, calcitriol.  Monitor lab.  # HTN/volume: Blood pressure and volume status acceptable.  Subjective: Seen and examined at bedside.  Denies nausea, vomiting, chest pain, shortness of breath.  He does have good urine output.  Objective Vital signs in last 24 hours: Vitals:   06/21/22 2043 06/22/22 0500 06/22/22 0508 06/22/22 0937  BP: 111/73  124/75 126/74  Pulse: 95     Resp: 20  16   Temp: 98 F (36.7 C)  97.8 F (36.6 C)   TempSrc: Oral     SpO2: 95%  94%   Weight:  72.8 kg    Height:       Weight change: 0.7 kg  Intake/Output Summary (Last 24 hours) at 06/22/2022 1133 Last data filed at 06/22/2022 0750 Gross per 24 hour  Intake 600 ml  Output 1250 ml  Net -650 ml       Labs: RENAL PANEL Recent Labs  Lab 06/19/22 0843 06/20/22 0753  NA 136 133*  K 4.3 4.5  CL 98 98  CO2  --  20*  GLUCOSE 93 94  BUN 56* 84*  CREATININE 5.30* 6.42*  CALCIUM  --  7.7*  PHOS  --  6.9*  ALBUMIN  --  3.1*    Liver Function Tests: Recent Labs  Lab 06/20/22 0753  ALBUMIN 3.1*   No results for input(s): "LIPASE", "AMYLASE" in the last 168 hours. No results for input(s): "AMMONIA" in the last 168 hours. CBC: Recent Labs    06/01/22 0759 06/02/22 0411 06/13/22 0156 06/14/22 0154 06/15/22 0202 06/19/22 0843 06/20/22 0753  HGB 6.7*   < > 7.6* 8.1* 8.0* 8.5* 7.6*  MCV 87.3   < > 95.8 96.3 95.6  --  96.8  TIBC 197*  --   --   --   --   --   --   IRON 31*  --   --   --   --   --    --    < > = values in this interval not displayed.    Cardiac Enzymes: No results for input(s): "CKTOTAL", "CKMB", "CKMBINDEX", "TROPONINI" in the last 168 hours. CBG: No results for input(s): "GLUCAP" in the last 168 hours.  Iron Studies: No results for input(s): "IRON", "TIBC", "TRANSFERRIN", "FERRITIN" in the last 72 hours. Studies/Results: IR Removal Tun Cv Cath W/O FL  Result Date: 06/20/2022 INDICATION: Patient with tunneled hemodialysis catheter placed 05/31/2022 by Vascular. Patient now has working fistula. Interventional radiology asked to remove tunneled dialysis catheter. EXAM: REMOVAL TUNNELED CENTRAL VENOUS CATHETER MEDICATIONS: None ANESTHESIA/SEDATION: None FLUOROSCOPY: None COMPLICATIONS: None immediate. PROCEDURE: Informed written consent was obtained from the patient after a thorough discussion of the procedural risks, benefits and alternatives. All questions were addressed. Maximal Sterile Barrier Technique was utilized including caps, mask, sterile gowns, sterile gloves, sterile drape, hand hygiene and skin antiseptic. A timeout was performed prior to the initiation of the procedure. The patient's right chest and catheter was prepped and draped  in a normal sterile fashion. Heparin was removed from both ports of catheter. Using gentle manual traction the cuff of the catheter was exposed and the catheter was removed in it's entirety. Pressure was held till hemostasis was obtained. A sterile dressing was applied. The patient tolerated the procedure well with no immediate complications. IMPRESSION: Successful catheter removal as described above. Procedure performed by Alwyn Ren NP Electronically Signed   By: Gilmer Mor D.O.   On: 06/20/2022 16:09    Medications: Infusions:   Scheduled Medications:  apixaban  2.5 mg Oral BID   calcitRIOL  0.5 mcg Oral Daily   calcium acetate  667 mg Oral TID WC   Chlorhexidine Gluconate Cloth  6 each Topical Q0600   darbepoetin  (ARANESP) injection - DIALYSIS  100 mcg Subcutaneous Q Fri-1800   diltiazem  300 mg Oral Daily   feeding supplement (NEPRO CARB STEADY)  237 mL Oral BID BM   ferrous sulfate  325 mg Oral Q breakfast   loratadine  10 mg Oral Daily    have reviewed scheduled and prn medications.  Physical Exam: General:NAD, comfortable Heart:RRR, s1s2 nl Lungs:clear b/l, no crackle Abdomen:soft, Non-tender, non-distended Extremities:No edema Dialysis Access: AVG has good thrill and bruit.  Jourdan Durbin Prasad Raheim Beutler 06/22/2022,11:33 AM  LOS: 2 days

## 2022-06-22 NOTE — Progress Notes (Signed)
PROGRESS NOTE   Subjective/Complaints: No new concerns of complaints this AM. Denies Pain. He reports therapy is going well overall.   ROS: denies pain, SOB, CP, HA, Insomnia. Denies N/V   Objective:   IR Removal Tun Cv Cath W/O FL  Result Date: 06/20/2022 INDICATION: Patient with tunneled hemodialysis catheter placed 05/31/2022 by Vascular. Patient now has working fistula. Interventional radiology asked to remove tunneled dialysis catheter. EXAM: REMOVAL TUNNELED CENTRAL VENOUS CATHETER MEDICATIONS: None ANESTHESIA/SEDATION: None FLUOROSCOPY: None COMPLICATIONS: None immediate. PROCEDURE: Informed written consent was obtained from the patient after a thorough discussion of the procedural risks, benefits and alternatives. All questions were addressed. Maximal Sterile Barrier Technique was utilized including caps, mask, sterile gowns, sterile gloves, sterile drape, hand hygiene and skin antiseptic. A timeout was performed prior to the initiation of the procedure. The patient's right chest and catheter was prepped and draped in a normal sterile fashion. Heparin was removed from both ports of catheter. Using gentle manual traction the cuff of the catheter was exposed and the catheter was removed in it's entirety. Pressure was held till hemostasis was obtained. A sterile dressing was applied. The patient tolerated the procedure well with no immediate complications. IMPRESSION: Successful catheter removal as described above. Procedure performed by Alwyn Ren NP Electronically Signed   By: Gilmer Mor D.O.   On: 06/20/2022 16:09   Recent Labs    06/19/22 0843 06/20/22 0753  WBC  --  5.8  HGB 8.5* 7.6*  HCT 25.0* 24.5*  PLT  --  260    Recent Labs    06/19/22 0843 06/20/22 0753  NA 136 133*  K 4.3 4.5  CL 98 98  CO2  --  20*  GLUCOSE 93 94  BUN 56* 84*  CREATININE 5.30* 6.42*  CALCIUM  --  7.7*     Intake/Output Summary  (Last 24 hours) at 06/22/2022 0815 Last data filed at 06/22/2022 0750 Gross per 24 hour  Intake 600 ml  Output 1250 ml  Net -650 ml         Physical Exam: Vital Signs Blood pressure 124/75, pulse 95, temperature 97.8 F (36.6 C), resp. rate 16, height 5\' 8"  (1.727 m), weight 72.8 kg, SpO2 94 %. Gen: no distress, normal appearing, lying in bed, appears comfortable HEENT: oral mucosa pink and moist, NCAT Cardio: Reg rate Chest: normal effort, normal rate of breathing Abd: soft, non-distended Ext: no edema +AVG Psych: pleasant, flat affect Skin:    General: Skin is warm and dry. RIJ TDC Neurological:     Mental Status: He is alert and oriented to person, place, and time. Decreased short term memory. No focal deficits   Assessment/Plan: 1. Functional deficits which require 3+ hours per day of interdisciplinary therapy in a comprehensive inpatient rehab setting. Physiatrist is providing close team supervision and 24 hour management of active medical problems listed below. Physiatrist and rehab team continue to assess barriers to discharge/monitor patient progress toward functional and medical goals  Care Tool:  Bathing    Body parts bathed by patient: Right arm, Left arm, Chest, Abdomen, Front perineal area, Buttocks, Right upper leg, Left upper leg, Right lower leg, Left lower leg,  Face         Bathing assist Assist Level: Supervision/Verbal cueing     Upper Body Dressing/Undressing Upper body dressing   What is the patient wearing?: Pull over shirt    Upper body assist Assist Level: Set up assist    Lower Body Dressing/Undressing Lower body dressing      What is the patient wearing?: Pants, Incontinence brief     Lower body assist Assist for lower body dressing: Minimal Assistance - Patient > 75%     Toileting Toileting    Toileting assist Assist for toileting: Minimal Assistance - Patient > 75%     Transfers Chair/bed transfer  Transfers assist      Chair/bed transfer assist level: Contact Guard/Touching assist     Locomotion Ambulation   Ambulation assist      Assist level: Contact Guard/Touching assist Assistive device: Walker-rolling Max distance: 49ft   Walk 10 feet activity   Assist     Assist level: Contact Guard/Touching assist Assistive device: Walker-rolling   Walk 50 feet activity   Assist    Assist level: Contact Guard/Touching assist Assistive device: Walker-rolling    Walk 150 feet activity   Assist Walk 150 feet activity did not occur: Safety/medical concerns (fatigue, decreased balance/coordination)         Walk 10 feet on uneven surface  activity   Assist     Assist level: Minimal Assistance - Patient > 75% Assistive device: Other (comment) (L handrail)   Wheelchair     Assist Is the patient using a wheelchair?: Yes Type of Wheelchair: Manual    Wheelchair assist level: Supervision/Verbal cueing Max wheelchair distance: 179ft    Wheelchair 50 feet with 2 turns activity    Assist        Assist Level: Supervision/Verbal cueing   Wheelchair 150 feet activity     Assist      Assist Level: Supervision/Verbal cueing   Blood pressure 124/75, pulse 95, temperature 97.8 F (36.6 C), resp. rate 16, height 5\' 8"  (1.727 m), weight 72.8 kg, SpO2 94 %.  Medical Problem List and Plan: 1. Functional deficits secondary to debility.             -patient may shower             -ELOS/Goals: 5-7 days             Order grounds pass  -Continue CIR  -Estimated discharge 5/30  -Ambulated 180 ft x2 with PT  2.  Antithrombotics: -DVT/anticoagulation:  Pharmaceutical: Eliquis             -antiplatelet therapy: N/A 3. Pain Management:  Tylenol prn 4. Mood/Behavior/Sleep: LCSW to follow for evaluation and support.              --insomnia-->no issues with sleeping. Will add melatonin prn.              -antipsychotic agents: N/A 5. Neuropsych/cognition: This patient is  capable of making decisions on his own behalf. 6. Skin/Wound Care: Routine pressure relief measures.    7. Fluids/Electrolytes/Nutrition: Strict I/O. 1200 cc FR/day             --renal diet   8. ESRD: HD at the end of the day to help with tolerance of therapy on MWF             --strict I/O. Daily weights. Renal diet w/1200 cc FR.    9. A fib with RVR: Monitor HR TID--continue Cardizem for  rate control. Magnesium reviewed and was previously low -- Eliquis for CVA prophylaxis   10. Neurogenic bladder s/p SPC: Does not remember why or who?  --SPC has not been changed for years per patient.   11. Anemia of chronic disease: CBC with HD session and aranesp every Friday for erythropoiesis. --transfusion prn symptoms. Hgb stable in 7-8 range.     12. Code status: DNR w/full scope of treatment. --per Palliative care w/option on comfort measures if he feels that he wants to stop HD.    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    14. Screening for vitamin D deficiency: check vitamin D level, added on  -5/24 will start 1000u vit D daily, Pt has been started on calcitriol 0.5 mcg by nephrology, this should provide activated vit D also, discussed with pharmacy- appreciate assistance    15. Iron deficiency anemia: add iron supplement    LOS: 2 days A FACE TO FACE EVALUATION WAS PERFORMED  Fanny Dance 06/22/2022, 8:15 AM

## 2022-06-22 NOTE — Progress Notes (Signed)
Physical Therapy Session Note  Patient Details  Name: Glenn Olson MRN: [id number] Date of Birth: August 15, 1940  Today's Date: 06/22/2022 PT Individual Time: 0700-0810 and 4098-1191 PT Individual Time Calculation (min): 70 min and 69 min  Short Term Goals: Week 1:  PT Short Term Goal 1 (Week 1): STG=LTG due to LOS  Skilled Therapeutic Interventions/Progress Updates:   Treatment Session 1 Received pt semi-reclined in bed eating breakfast. While pt ate, located rollator and educated pt on benefits of rollator (allowing pt ability to conserve energy and sit and rest wherever), especially with pt's quick onset of fatigue - pt reluctantly agreed to try rollator. Pt agreeable to PT treatment  and denied any pain during session. Session with emphasis on functional mobility/transfers, generalized strengthening and endurance, dynamic standing balance/coordination, gait training. HR remained elevated (128-130bpm throughout session) and pt required more rest breaks today > yesterday. Pt performed bed mobility with HOB elevated and use of bedrails with supervision x 2 trials throughout session. Donned pants sitting EOB with mod A to thread catheter through and pt stood without AD and CGA to pull pants over hips - transferred bed<>WC stand<>pivot without AD and CGA. Pt transported to/from room in [facility] dependently for energy conservation purposes. Educated pt on Chief Executive Officer and importance of backing rollator against stable surfaces (wall) prior to sitting. Stood with rollator and CGA and pt ambulated 90ft x 2 trials with rollator and CGA - practiced sitting on rollator with min cues for safety - required extensive seated rest break.  Stood from [facility] with RW and CGA x 5 trials and performed the following exercises with heavy reliance on BUE support on RW in standing and CGA for balance with emphasis on UE/LE strength/endurance. Pt required seated rest break in between each standing set  and cues for eccentric control and technique as pt attempting to rush through exercises. -standing single leg marches 2x10 with 1.5lb ankle weights -standing hip abduction 2x10 bilaterally with 1.5lb ankle weights -standing mini squats 2x8  -seated bicep curls with 6lb dumbbell 2x10 bilaterally  -seated horizontal chest press with 4lb dowel 2x10 -seated overhead chest press with 4lb dowel 2x10 Stood with RW and CGA and ambulated additional 68ft x 2 trials with RW and CGA fading to close supervision - pt reporting 6/10 fatigue at end of session but appeared extremely fatigued. Returned to room and requested to return to bed. Stood with RW and CGA and ambulated back to bed. Concluded session with pt semi-reclined in bed, needs within reach, and bed alarm on.   Treatment Session 2 Received pt semi-reclined in bed asleep. Pt easily aroused and agreeable to PT treatment and denied any pain during session. Session with emphasis on functional mobility/transfers, generalized strengthening and endurance, dynamic standing balance/coordination, and gait training. HR remained 125-128bpm throughout session but pt required less rest breaks compared to this morning. Pt performed bed mobility with HOB elevated and use of bedrails with supervision and performed all transfers with RW and CGA throughout session. Pt ambulated 1103ft with RW and CGA to dayroom. Took seated rest break, then worked on ambulation through obstacle course weaving in/out of cones for 44ft x 2 trials with RW and CGA - emphasis on navigating tight turns. Stood from elevated EOM on [employer] with RW and CGA and worked on dynamic standing balance tossing beanbags using RUE and LUE support x 1 trial progressing to no UE support for additional x 2 trials with min A for balance. Pt performed TUG with  RW and CGA with average of 33 seconds. Pt educated on test results/significance indicating high fall risk and importance of using AD upon discharge.  Tiral 1: 36  seconds Trial 2: 33 seconds Trial 3: 30 seconds Pt then ambulated additional 136ft x 2 trials with RW and CGA with emphasis on aerobic endurance. Pt ambulates with flexed trunk/downward gaze, decreased foot clearance, and with decreased cadence but no LOB noted. Worked on dynamic standing balance batting ball with 2lb dowel 2x20 reps with CGA for balance. Pt ambulated additional 159ft with RW and CGA back to room and concluded session with pt sitting in WC, needs within reach, and chair pad alarm on.   Therapy Documentation Precautions:  Precautions Precautions: Fall Precaution Comments: watch HR Restrictions Weight Bearing Restrictions: No  Therapy/Group: Individual Therapy Marlana Salvage Zaunegger Blima Rich PT, DPT 06/22/2022, 6:25 AM

## 2022-06-23 LAB — GLUCOSE, CAPILLARY
Glucose-Capillary: 99 mg/dL (ref 70–99)
Glucose-Capillary: 91 mg/dL (ref 70–99)
Glucose-Capillary: 113 mg/dL — ABNORMAL HIGH (ref 70–99)

## 2022-06-23 MED ORDER — CARBAMIDE PEROXIDE 6.5 % OT SOLN
5.0000 [drp] | Freq: Three times a day (TID) | OTIC | Status: DC
  Administered 2022-06-23 – 2022-06-28 (×12): 5 [drp] via OTIC
  Filled 2022-06-23: qty 15

## 2022-06-23 NOTE — Progress Notes (Signed)
PROGRESS NOTE   Subjective/Complaints:  Walking with PT this am from room to gym   ROS: denies pain, SOB, CP, HA, Insomnia. Denies N/V   Objective:   No results found. No results for input(s): "WBC", "HGB", "HCT", "PLT" in the last 72 hours.  No results for input(s): "NA", "K", "CL", "CO2", "GLUCOSE", "BUN", "CREATININE", "CALCIUM" in the last 72 hours.   Intake/Output Summary (Last 24 hours) at 06/23/2022 1102 Last data filed at 06/23/2022 0800 Gross per 24 hour  Intake 485 ml  Output 1001.9 ml  Net -516.9 ml         Physical Exam: Vital Signs Blood pressure (!) 152/72, pulse 66, temperature 97.6 F (36.4 C), resp. rate 19, height 5\' 8"  (1.727 m), weight 70.3 kg, SpO2 92 %.  General: No acute distress Mood and affect are appropriate Heart: Regular rate and rhythm no rubs murmurs or extra sounds Lungs: Clear to auscultation, breathing unlabored, no rales or wheezes Abdomen: Positive bowel sounds, soft nontender to palpation, nondistended Extremities: No clubbing, cyanosis, or edema   Neurological:     Mental Status: He is alert and oriented to person, place, Month and day but not date . Decreased short term memory. No focal deficits MMT - 4/5 in BUE and BLE   Assessment/Plan: 1. Functional deficits which require 3+ hours per day of interdisciplinary therapy in a comprehensive inpatient rehab setting. Physiatrist is providing close team supervision and 24 hour management of active medical problems listed below. Physiatrist and rehab team continue to assess barriers to discharge/monitor patient progress toward functional and medical goals  Care Tool:  Bathing    Body parts bathed by patient: Right arm, Left arm, Chest, Abdomen, Front perineal area, Buttocks, Right upper leg, Left upper leg, Right lower leg, Left lower leg, Face         Bathing assist Assist Level: Supervision/Verbal cueing     Upper  Body Dressing/Undressing Upper body dressing   What is the patient wearing?: Pull over shirt    Upper body assist Assist Level: Set up assist    Lower Body Dressing/Undressing Lower body dressing      What is the patient wearing?: Pants, Incontinence brief     Lower body assist Assist for lower body dressing: Minimal Assistance - Patient > 75%     Toileting Toileting    Toileting assist Assist for toileting: Minimal Assistance - Patient > 75%     Transfers Chair/bed transfer  Transfers assist     Chair/bed transfer assist level: Contact Guard/Touching assist     Locomotion Ambulation   Ambulation assist      Assist level: Contact Guard/Touching assist Assistive device: Walker-rolling Max distance: 141ft   Walk 10 feet activity   Assist     Assist level: Contact Guard/Touching assist Assistive device: Walker-rolling   Walk 50 feet activity   Assist    Assist level: Contact Guard/Touching assist Assistive device: Walker-rolling    Walk 150 feet activity   Assist Walk 150 feet activity did not occur: Safety/medical concerns (fatigue, decreased balance/coordination)  Assist level: Contact Guard/Touching assist Assistive device: Walker-rolling    Walk 10 feet on uneven surface  activity  Assist     Assist level: Minimal Assistance - Patient > 75% Assistive device: Other (comment) (L handrail)   Wheelchair     Assist Is the patient using a wheelchair?: Yes Type of Wheelchair: Manual    Wheelchair assist level: Supervision/Verbal cueing Max wheelchair distance: 130ft    Wheelchair 50 feet with 2 turns activity    Assist        Assist Level: Supervision/Verbal cueing   Wheelchair 150 feet activity     Assist      Assist Level: Supervision/Verbal cueing   Blood pressure (!) 152/72, pulse 66, temperature 97.6 F (36.4 C), resp. rate 19, height 5\' 8"  (1.727 m), weight 70.3 kg, SpO2 92 %.  Medical Problem List  and Plan: 1. 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             -patient may shower             -ELOS/Goals: 5-7 days             Order grounds pass  -Continue CIR  -Estimated discharge 5/30  -Ambulated 180 ft x2 with PT  2.  Antithrombotics: -DVT/anticoagulation:  Pharmaceutical: Eliquis             -antiplatelet therapy: N/A 3. Pain Management:  Tylenol prn 4. Mood/Behavior/Sleep: LCSW to follow for evaluation and support.              --insomnia-->no issues with sleeping. Will add melatonin prn.              -antipsychotic agents: N/A 5. Neuropsych/cognition: This patient is capable of making decisions on his own behalf. 6. Skin/Wound Care: Routine pressure relief measures.    7. Fluids/Electrolytes/Nutrition: Strict I/O. 1200 cc FR/day             --renal diet   8. ESRD: HD at the end of the day to help with tolerance of therapy on MWF             --strict I/O. Daily weights. Renal diet w/1200 cc FR.    9. A fib with RVR: Monitor HR TID--continue Cardizem for rate control. Magnesium reviewed and was previously low -- Eliquis for CVA prophylaxis   10. Neurogenic bladder s/p SPC: Does not remember why or who?  --SPC has not been changed for years per patient.   11. Anemia of chronic disease: CBC with HD session and aranesp every Friday for erythropoiesis. --transfusion prn symptoms. Hgb stable in 7-8 range.     12. Code status: DNR w/full scope of treatment. --per Palliative care w/option on comfort measures if he feels that he wants to stop HD.    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    14. Screening for vitamin D deficiency: check vitamin D level, added on  -5/24 will start 1000u vit D daily, Pt has been started on calcitriol 0.5 mcg by nephrology, this should provide activated vit D also, discussed with pharmacy- appreciate assistance    15. Iron deficiency anemia: add iron supplement    LOS: 3 days A FACE TO  FACE EVALUATION WAS PERFORMED  Erick Colace 06/23/2022, 11:02 AM

## 2022-06-23 NOTE — Progress Notes (Signed)
[facility] NEPHROLOGY PROGRESS NOTE  Assessment/ Plan: Pt is a 82 y.o. yo male ESRD on HD now in CIR for rehab.  # ESRD: It looks like he was off of dialysis for some time, looked into PD but not an option because of sedation. Now he is getting dialysis MWF schedule.  Outpatient HD arranged at [facility] TTS 6:10 AM. Status post dialysis yesterday and tolerated well.  Possibly next dialysis on Monday and then can do Thursday to make TTS schedule.  # Anemia of CKD: Received IV iron and now on ESA.  Monitor hemoglobin.  # Secondary hyperparathyroidism/hyperphosphatemia: On PhosLo, calcitriol.  Monitor lab.  # HTN/volume: Blood pressure and volume status acceptable.  Subjective: Seen and examined at bedside.  No new event.  Denies nausea, vomiting, chest pain, shortness of breath. Objective Vital signs in last 24 hours: Vitals:   06/22/22 2000 06/23/22 0533 06/23/22 0540 06/23/22 0550  BP: 130/72  116/80 (!) 152/72  Pulse: 97  (!) 108 66  Resp: 20  17 19   Temp: 98.5 F (36.9 C)  98.7 F (37.1 C) 97.6 F (36.4 C)  TempSrc: Oral  Oral   SpO2: 94%  99% 92%  Weight:  70.3 kg    Height:       Weight change: -2.5 kg  Intake/Output Summary (Last 24 hours) at 06/23/2022 1026 Last data filed at 06/23/2022 0500 Gross per 24 hour  Intake 485 ml  Output 901.9 ml  Net -416.9 ml        Labs: RENAL PANEL Recent Labs  Lab 06/19/22 0843 06/20/22 0753  NA 136 133*  K 4.3 4.5  CL 98 98  CO2  --  20*  GLUCOSE 93 94  BUN 56* 84*  CREATININE 5.30* 6.42*  CALCIUM  --  7.7*  PHOS  --  6.9*  ALBUMIN  --  3.1*     Liver Function Tests: Recent Labs  Lab 06/20/22 0753  ALBUMIN 3.1*    No results for input(s): "LIPASE", "AMYLASE" in the last 168 hours. No results for input(s): "AMMONIA" in the last 168 hours. CBC: Recent Labs    06/01/22 0759 06/02/22 0411 06/13/22 0156 06/14/22 0154 06/15/22 0202 06/19/22 0843 06/20/22 0753  HGB 6.7*   < > 7.6* 8.1* 8.0*  8.5* 7.6*  MCV 87.3   < > 95.8 96.3 95.6  --  96.8  TIBC 197*  --   --   --   --   --   --   IRON 31*  --   --   --   --   --   --    < > = values in this interval not displayed.     Cardiac Enzymes: No results for input(s): "CKTOTAL", "CKMB", "CKMBINDEX", "TROPONINI" in the last 168 hours. CBG: Recent Labs  Lab 06/23/22 0556  GLUCAP 99    Iron Studies: No results for input(s): "IRON", "TIBC", "TRANSFERRIN", "FERRITIN" in the last 72 hours. Studies/Results: No results found.  Medications: Infusions:   Scheduled Medications:  apixaban  2.5 mg Oral BID   calcitRIOL  0.5 mcg Oral Daily   calcium acetate  667 mg Oral TID WC   Chlorhexidine Gluconate Cloth  6 each Topical Q0600   cholecalciferol  1,000 Units Oral Daily   darbepoetin (ARANESP) injection - DIALYSIS  100 mcg Subcutaneous Q Fri-1800   diltiazem  300 mg Oral Daily   feeding supplement (NEPRO CARB STEADY)  237 mL Oral BID BM   ferrous sulfate  325 mg Oral Q breakfast   loratadine  10 mg Oral Daily    have reviewed scheduled and prn medications.  Physical Exam: General:NAD, comfortable Heart:RRR, s1s2 nl Lungs:clear b/l, no crackle Abdomen:soft, Non-tender, non-distended Extremities:No edema Dialysis Access: AVG has good thrill and bruit.  Shanette Tamargo Prasad Han Vejar 06/23/2022,10:26 AM  LOS: 3 days

## 2022-06-23 NOTE — IPOC Note (Signed)
Overall Plan of Care [facility]) Patient Details Name: Glenn Olson MRN: [id number] DOB: April 21, 1940  Admitting Diagnosis: Debility  Hospital Problems: Principal Problem:   Debility     Functional Problem List: Nursing Bladder, Bowel, Endurance, Medication Management, Nutrition, Pain  PT Balance, Edema, Endurance, Nutrition, Safety  OT Balance, Motor  SLP    TR         Basic ADL's: OT Bathing, Grooming, Dressing, Toileting     Advanced  ADL's: OT       Transfers: PT Bed Mobility, Bed to Chair, Car, Occupational psychologist, Research scientist (life sciences): PT Ambulation, Psychologist, prison and probation services, Stairs     Additional Impairments: OT    SLP        TR      Anticipated Outcomes Item Anticipated Outcome  Self Feeding    Swallowing      Basic self-care  Mod I  Toileting  Mod I   Bathroom Transfers Mod I  Bowel/Bladder  independent with SPC and continent of bowel  Transfers  Mod I with LRAD  Locomotion  Mod I with LRAD  Communication     Cognition     Pain  less than 3  Safety/Judgment  fall free   Therapy Plan: PT Intensity: Minimum of 1-2 x/day ,45 to 90 minutes PT Frequency: 5 out of 7 days PT Duration Estimated Length of Stay: 7 days OT Intensity: Minimum of 1-2 x/day, 45 to 90 minutes OT Frequency: 5 out of 7 days OT Duration/Estimated Length of Stay: 5-7 days     Team Interventions: Nursing Interventions Patient/Family Education, Medication Management, Psychosocial Support, Bladder Management, Bowel Management, Disease Management/Prevention, Pain Management, Discharge Planning  PT interventions Ambulation/gait training, Discharge planning, Functional mobility training, Therapeutic Activities, Psychosocial support, Visual/perceptual remediation/compensation, Balance/vestibular training, Disease management/prevention, Neuromuscular re-education, Skin care/wound management, Therapeutic Exercise, Wheelchair propulsion/positioning, DME/adaptive equipment  instruction, Pain management, UE/LE Strength taining/ROM, Community reintegration, Equities trader education, Museum/gallery curator, UE/LE Coordination activities  OT Interventions Warden/ranger, Neuromuscular re-education, Self Care/advanced ADL retraining, Therapeutic Exercise, DME/adaptive equipment instruction, UE/LE Strength taining/ROM, UE/LE Coordination activities, Patient/family education, Discharge planning, Functional mobility training, Therapeutic Activities  SLP Interventions    TR Interventions    SW/CM Interventions Discharge Planning, Psychosocial Support, Patient/Family Education, Disease Management/Prevention   Barriers to Discharge MD  Medical stability  Nursing Decreased caregiver support, Home environment access/layout, Hemodialysis, Medication compliance home alone with family available intermittenly  PT Home environment access/layout, Decreased caregiver support, Hemodialysis split level home, son and daughter in law work, hemodialysis, decreased insight into deficits  OT Neurogenic Bowel & Bladder, Lack of/limited family support Lives with daughter and son-in-law who work first shift and are gone during the day.  SLP      SW Other (comments) If son and DIL unable to provide 24/7, anticpate hiring HC.   Team Discharge Planning: Destination: PT-Home ,OT- Home , SLP-  Projected Follow-up: PT-Home health PT, OT-  Home health OT, SLP-  Projected Equipment Needs: PT-To be determined, OT- To be determined, SLP-  Equipment Details: PT-has RW, OT-RW, cane, Possible shower (check with son-in-law) grab bars (shower and toilet) Patient/family involved in discharge planning: PT- Patient,  OT-Patient, SLP-   MD ELOS: 5-7d Medical Rehab Prognosis:  Good Assessment: The patient has been admitted for CIR therapies with the diagnosis of debility . The team will be addressing functional mobility, strength, stamina, balance, safety, adaptive techniques and equipment, self-care,  bowel and bladder mgt, patient and caregiver education, ESRD. Goals have been set  at mod I. Anticipated discharge destination is home .        See Team Conference Notes for weekly updates to the plan of care

## 2022-06-24 LAB — GLUCOSE, CAPILLARY
Glucose-Capillary: 80 mg/dL (ref 70–99)
Glucose-Capillary: 139 mg/dL — ABNORMAL HIGH (ref 70–99)

## 2022-06-24 MED ORDER — CHLORHEXIDINE GLUCONATE CLOTH 2 % EX PADS
6.0000 | MEDICATED_PAD | Freq: Every day | CUTANEOUS | Status: DC
  Administered 2022-06-24 – 2022-06-26 (×3): 6 via TOPICAL

## 2022-06-24 NOTE — Progress Notes (Signed)
Occupational Therapy Session Note  Patient Details  Name: Glenn Olson MRN: [id number] Date of Birth: 02-20-1940  Today's Date: 06/24/2022 OT Individual Time: 0805-0900 OT Individual Time Calculation (min): 55 min   Short Term Goals: Week 1:  OT Short Term Goal 1 (Week 1): STG = LTG d/t ELOS  Skilled Therapeutic Interventions/Progress Updates:  Pt received resting in bed for skilled OT session with focus on functional mobility with trial of rollator, dynamic standing balance, and overall activity tolerance. Pt agreeable to interventions, demonstrating overall pleasant mood. Pt with no reports of pain, stating "I'm just tired." OT offering intermediate rest breaks and positioning suggestions throughout session to address potential pain/fatigue and maximize participation/safety in session.   Pt performs bed mobility with HOB elevated and supervision. At EOB, pt re-educated on rollator safety and benefits. AD trialed due to previous introduction by PT. Pt receptive to using rollator during session.  Pt then ambulates from room<>day room with close supervision + rollator.   In day room, pt participates in dynamic balance activity with use of colored visual targets, pt tasked to coordinate stepping with either R/L foot to tap a specific colored dot as called by therapist. Pt attempts activity without UE support, observed to be taking shuffling steps towards target, pt requiring unilateral support to step BLEs outside of BOS and complete activity as instructed.   To simulate stepping over household-level thresholds, pt performs 5 reps/2 sets of blocked-practice steps onto ~2 inch step. Pt requires unilateral support (Min A) to complete task with no LOB noted.   Pt then led through series of UE strengthening exercises, including:  -Chest press -Bicep curls -Overhead press  Pt completes 2 sets/ 10 reps of each exercise with 5#  dowel bar, requiring verbal cuing for correct form and pacing.   Pt  performs all functional transfers with close supervision + rollator, requiring moderate cuing for safe use of AD. Pt's HR monitored throughout session, averaging at ~131 BPM, rest breaks provided as needed.   Pt remained resting in bed with all immediate needs met at end of session. Pt continues to be appropriate for skilled OT intervention to promote further functional independence.   Therapy Documentation Precautions:  Precautions Precautions: Fall Precaution Comments: watch HR Restrictions Weight Bearing Restrictions: No   Therapy/Group: Individual Therapy  Lou Cal, OTR/L, MSOT  06/24/2022, 5:21 AM

## 2022-06-24 NOTE — Progress Notes (Signed)
Physical Therapy Session Note  Patient Details  Name: Glenn Olson MRN: [id number] Date of Birth: [birth year]  Today's Date: 06/24/2022 PT Individual Time: 1000-1100 and 1300-1400  PT Individual Time Calculation (min): 60 min and 60 min.  Short Term Goals: Week 1:  PT Short Term Goal 1 (Week 1): STG=LTG due to LOS  Skilled Therapeutic Interventions/Progress Updates:   First session:  Pt presents proped up in bed almost 90 degrees and agreeable to therapy.  Pt transfers to EOB w/ supervision.  Pt transfers sit to stand w/ CGA.  Pt amb w/ rollator and CGA to close supervision and verbal cues for posture x 100' to dayroom and verbal cues for placement of rollator against mat table for stability/safety.  Pt performed standing toe taps to 3 3/4" platform, 1 set w/ R HHA and then 2 x 10 w/ only tactile cueing to R shoulder and verbal cues for wt shifting, especially to L foot.  Pt HR checked throughout session and maintained at 128 bpm, validated by manual check.  Pt performed stepping over hockey stick w/ cueing and wt shift to stance leg and min A.  Pt requires increased A when stepping w/ LLE first.  Pt performed stepping to colored number discs forward/back and then diagonally.  Pt w/ LOB w/ diagonal.  Pt requires seated rest breaks between sets 2/2 fatigue and states"I don't think I should be this worn out."  PT provided encouragement and progress.  Pt amb to room and returned to sitting up in bed.  Bed alarm on and all needs in reach.  Second session:  Pt presents amb out of BR w/ rollator.  Pt family in room, and encouraged to call for assist, pt aware of need.  Pt amb w/ rollator x 285' w/ close supervision and cues for posture, rollator management.  Pt performed Nu-step at Level 4 x 8' w/ seated rest break x 2' and then performed another 8'.  Pt performed at average of 37 spm and total of 589 steps.  Again pt expresses frustration w/ fatigue level.  Pt amb x 165' w/ rollator and close  supervision/CGA.  Pt positions rollator against wall before locking brakes and turning to sit w/o instructions needed.  Pt amb x 120' to return to room w/ CGA/close supervision and sat EOB.  Pt given clean scrub shirt and doffed dirty and donned clean shirt w/ set-up.  Pt transfers sit to supine w/ supervision.  Bed alarm on and all needs in reach.     Therapy Documentation Precautions:  Precautions Precautions: Fall Precaution Comments: watch HR Restrictions Weight Bearing Restrictions: No General:   Vital Signs:   Pain:0/10     Therapy/Group: Individual Therapy  [name] 06/24/2022, 12:49 PM

## 2022-06-24 NOTE — Progress Notes (Signed)
Occupational Therapy Session Note  Patient Details  Name: Rehan Hindmon MRN: [id number] Date of Birth: [birth year]  Today's Date: 06/24/2022 OT Individual Time: 1500-1527 OT Individual Time Calculation (min): 27 min    Short Term Goals: Week 1:  OT Short Term Goal 1 (Week 1): STG = LTG d/t ELOS  Skilled Therapeutic Interventions/Progress Updates: Patient received resting in bed. Agreeable to OT intervention. Patient Able to get OOB with SBA to transfer to w/c. Transported to therapy gym for UE strength and endurance exercises using3 # dowel. 2 x 10 in varied planes without complaint shortness of breath or difficulty. Patient does not report any concerns with discharge home with son and family. Reports his family sets up meals to reheat and has an easily accessible shower. Patient does state that his son is planning to get a w/c for him to use in the home as needed. Patient states he feels good walking with the walker and does not want to use the w/c at this time. Continue with skilled OT POC to further improve functional mobility and self care skills.      Therapy Documentation Precautions:  Precautions Precautions: Fall Precaution Comments: watch HR Restrictions Weight Bearing Restrictions: No General:   Vital Signs: Therapy Vitals Temp: (!) 97.4 F (36.3 C) Pulse Rate: 82 Resp: 18 BP: 127/70 Patient Position (if appropriate): Lying Oxygen Therapy SpO2: 98 % O2 Device: Room Air Pain:no report of pain     Therapy/Group: Individual Therapy  Warnell Forester 06/24/2022, 3:28 PM

## 2022-06-24 NOTE — Progress Notes (Signed)
[facility] NEPHROLOGY PROGRESS NOTE  Assessment/ Plan: Pt is a 82 y.o. yo male ESRD on HD now in CIR for rehab.  # ESRD: It looks like he was off of dialysis for some time, looked into PD but not an option because of sedation. Now he is getting dialysis MWF schedule.  Outpatient HD arranged at [facility] TTS 6:10 AM. Status post HD on 5/24 with 1.9 L ultrafiltration.  I will plan for HD tomorrow and then on Thursday to change to TTS schedule.  # Anemia of CKD: Received IV iron and now on ESA.  Monitor hemoglobin.  # Secondary hyperparathyroidism/hyperphosphatemia: On PhosLo, calcitriol.  Monitor lab.  # HTN/volume: Blood pressure and volume status acceptable.  Subjective: Seen and examined at bedside.  No new event.  Denies nausea, vomiting, chest pain, shortness of breath.. Objective Vital signs in last 24 hours: Vitals:   06/23/22 1419 06/23/22 2014 06/24/22 0500 06/24/22 0515  BP: 109/62 116/70  113/77  Pulse: 80 (!) 101  82  Resp: 19 18  20   Temp: 98 F (36.7 C) 98.5 F (36.9 C)    TempSrc: Oral     SpO2: 100% 97%  98%  Weight:   72.6 kg   Height:       Weight change: 2.3 kg  Intake/Output Summary (Last 24 hours) at 06/24/2022 0954 Last data filed at 06/24/2022 0834 Gross per 24 hour  Intake 640 ml  Output 1000 ml  Net -360 ml        Labs: RENAL PANEL Recent Labs  Lab 06/19/22 0843 06/20/22 0753  NA 136 133*  K 4.3 4.5  CL 98 98  CO2  --  20*  GLUCOSE 93 94  BUN 56* 84*  CREATININE 5.30* 6.42*  CALCIUM  --  7.7*  PHOS  --  6.9*  ALBUMIN  --  3.1*     Liver Function Tests: Recent Labs  Lab 06/20/22 0753  ALBUMIN 3.1*    No results for input(s): "LIPASE", "AMYLASE" in the last 168 hours. No results for input(s): "AMMONIA" in the last 168 hours. CBC: Recent Labs    06/01/22 0759 06/02/22 0411 06/13/22 0156 06/14/22 0154 06/15/22 0202 06/19/22 0843 06/20/22 0753  HGB 6.7*   < > 7.6* 8.1* 8.0* 8.5* 7.6*  MCV 87.3   < > 95.8  96.3 95.6  --  96.8  TIBC 197*  --   --   --   --   --   --   IRON 31*  --   --   --   --   --   --    < > = values in this interval not displayed.     Cardiac Enzymes: No results for input(s): "CKTOTAL", "CKMB", "CKMBINDEX", "TROPONINI" in the last 168 hours. CBG: Recent Labs  Lab 06/23/22 0556 06/23/22 1204 06/23/22 2124 06/24/22 0620  GLUCAP 99 91 113* 80     Iron Studies: No results for input(s): "IRON", "TIBC", "TRANSFERRIN", "FERRITIN" in the last 72 hours. Studies/Results: No results found.  Medications: Infusions:   Scheduled Medications:  apixaban  2.5 mg Oral BID   calcitRIOL  0.5 mcg Oral Daily   calcium acetate  667 mg Oral TID WC   carbamide peroxide  5 drop Both EARS TID   Chlorhexidine Gluconate Cloth  6 each Topical Q0600   cholecalciferol  1,000 Units Oral Daily   darbepoetin (ARANESP) injection - DIALYSIS  100 mcg Subcutaneous Q Fri-1800   diltiazem  300 mg  Oral Daily   feeding supplement (NEPRO CARB STEADY)  237 mL Oral BID BM   ferrous sulfate  325 mg Oral Q breakfast   loratadine  10 mg Oral Daily    have reviewed scheduled and prn medications.  Physical Exam: General:NAD, comfortable Heart:RRR, s1s2 nl Lungs:clear b/l, no crackle Abdomen:soft, Non-tender, non-distended Extremities:No edema Dialysis Access: AVG has good thrill and bruit.  Glenn Olson 06/24/2022,9:54 AM  LOS: 4 days

## 2022-06-25 LAB — CBC
HCT: 26.7 % — ABNORMAL LOW (ref 39.0–52.0)
Hemoglobin: 8.4 g/dL — ABNORMAL LOW (ref 13.0–17.0)
MCH: 29.7 pg (ref 26.0–34.0)
MCHC: 31.5 g/dL (ref 30.0–36.0)
MCV: 94.3 fL (ref 80.0–100.0)
Platelets: 250 10*3/uL (ref 150–400)
RBC: 2.83 MIL/uL — ABNORMAL LOW (ref 4.22–5.81)
RDW: 15.3 % (ref 11.5–15.5)
WBC: 6.9 10*3/uL (ref 4.0–10.5)
nRBC: 0 % (ref 0.0–0.2)

## 2022-06-25 LAB — RENAL FUNCTION PANEL
Albumin: 3.2 g/dL — ABNORMAL LOW (ref 3.5–5.0)
Anion gap: 18 — ABNORMAL HIGH (ref 5–15)
BUN: 97 mg/dL — ABNORMAL HIGH (ref 8–23)
CO2: 21 mmol/L — ABNORMAL LOW (ref 22–32)
Calcium: 8.3 mg/dL — ABNORMAL LOW (ref 8.9–10.3)
Chloride: 98 mmol/L (ref 98–111)
Creatinine, Ser: 7.41 mg/dL — ABNORMAL HIGH (ref 0.61–1.24)
GFR, Estimated: 7 mL/min — ABNORMAL LOW (ref >60)
Glucose, Bld: 145 mg/dL — ABNORMAL HIGH (ref 70–99)
Phosphorus: 6.9 mg/dL — ABNORMAL HIGH (ref 2.5–4.6)
Potassium: 3.7 mmol/L (ref 3.5–5.1)
Sodium: 137 mmol/L (ref 135–145)

## 2022-06-25 LAB — GLUCOSE, CAPILLARY: Glucose-Capillary: 81 mg/dL (ref 70–99)

## 2022-06-25 MED ORDER — LIDOCAINE-PRILOCAINE 2.5-2.5 % EX CREA: 1.0000 | TOPICAL_CREAM | CUTANEOUS | Status: DC | PRN

## 2022-06-25 MED ORDER — ALTEPLASE 2 MG IJ SOLR: 2.0000 mg | Freq: Once | INTRAMUSCULAR | Status: DC | PRN

## 2022-06-25 MED ORDER — HEPARIN SODIUM (PORCINE) 1000 UNIT/ML DIALYSIS: 1000.0000 [IU] | INTRAMUSCULAR | Status: DC | PRN

## 2022-06-25 MED ORDER — ANTICOAGULANT SODIUM CITRATE 4% (200MG/5ML) IV SOLN
5.0000 mL | Status: DC | PRN
  Filled 2022-06-25: qty 5

## 2022-06-25 MED ORDER — LIDOCAINE HCL (PF) 1 % IJ SOLN: 5.0000 mL | INTRAMUSCULAR | Status: DC | PRN

## 2022-06-25 MED ORDER — PENTAFLUOROPROP-TETRAFLUOROETH EX AERO: 1.0000 | INHALATION_SPRAY | CUTANEOUS | Status: DC | PRN

## 2022-06-25 MED ORDER — DARBEPOETIN ALFA 200 MCG/0.4ML IJ SOSY: 200.0000 ug | PREFILLED_SYRINGE | INTRAMUSCULAR | Status: DC

## 2022-06-25 NOTE — Progress Notes (Signed)
Patient back on unit from dialysis. Meal tray ordered and set up for patient. Vital signs obtained. CHG bath completed.

## 2022-06-25 NOTE — Progress Notes (Signed)
[facility] NEPHROLOGY PROGRESS NOTE  Assessment/ Plan: Pt is a 82 y.o. yo male ESRD on HD now in CIR for rehab.  # ESRD: It looks like he was off of dialysis for some time, looked into PD but not an option because of sedation. Now he is getting dialysis MWF schedule.  Outpatient HD arranged at [facility] TTS 6:10 AM. Status post HD on 5/24 with 1.9 L ultrafiltration.  I will plan for HD today and then on Thursday to change to TTS schedule.  # Anemia of CKD: Received IV iron and now on ESA.  Monitor hemoglobin.  # Secondary hyperparathyroidism/hyperphosphatemia: On PhosLo, calcitriol-  to continue  # HTN/volume: Blood pressure and volume status acceptable. On cardizem only  Subjective: Seen participating in rehab.  No new event.  Denies nausea, vomiting, chest pain, shortness of breath.  Objective Vital signs in last 24 hours: Vitals:   06/24/22 1948 06/24/22 2112 06/25/22 0416 06/25/22 0500  BP: (!) 134/115 113/71 125/84   Pulse: 81 (!) 106 72   Resp: 18 18 18    Temp: 97.9 F (36.6 C)  97.8 F (36.6 C)   TempSrc: Oral     SpO2: 96%  95%   Weight:    70.8 kg  Height:       Weight change: -1.8 kg  Intake/Output Summary (Last 24 hours) at 06/25/2022 1141 Last data filed at 06/25/2022 0419 Gross per 24 hour  Intake 660 ml  Output 1350 ml  Net -690 ml       Labs: RENAL PANEL Recent Labs  Lab 06/19/22 0843 06/20/22 0753  NA 136 133*  K 4.3 4.5  CL 98 98  CO2  --  20*  GLUCOSE 93 94  BUN 56* 84*  CREATININE 5.30* 6.42*  CALCIUM  --  7.7*  PHOS  --  6.9*  ALBUMIN  --  3.1*    Liver Function Tests: Recent Labs  Lab 06/20/22 0753  ALBUMIN 3.1*   No results for input(s): "LIPASE", "AMYLASE" in the last 168 hours. No results for input(s): "AMMONIA" in the last 168 hours. CBC: Recent Labs    06/01/22 0759 06/02/22 0411 06/13/22 0156 06/14/22 0154 06/15/22 0202 06/19/22 0843 06/20/22 0753  HGB 6.7*   < > 7.6* 8.1* 8.0* 8.5* 7.6*  MCV 87.3    < > 95.8 96.3 95.6  --  96.8  TIBC 197*  --   --   --   --   --   --   IRON 31*  --   --   --   --   --   --    < > = values in this interval not displayed.    Cardiac Enzymes: No results for input(s): "CKTOTAL", "CKMB", "CKMBINDEX", "TROPONINI" in the last 168 hours. CBG: Recent Labs  Lab 06/23/22 0556 06/23/22 1204 06/23/22 2124 06/24/22 0620 06/24/22 2137  GLUCAP 99 91 113* 80 139*    Iron Studies: No results for input(s): "IRON", "TIBC", "TRANSFERRIN", "FERRITIN" in the last 72 hours. Studies/Results: No results found.  Medications: Infusions:   Scheduled Medications:  apixaban  2.5 mg Oral BID   calcitRIOL  0.5 mcg Oral Daily   calcium acetate  667 mg Oral TID WC   carbamide peroxide  5 drop Both EARS TID   Chlorhexidine Gluconate Cloth  6 each Topical Q0600   Chlorhexidine Gluconate Cloth  6 each Topical Q0600   cholecalciferol  1,000 Units Oral Daily   darbepoetin (ARANESP) injection - DIALYSIS  100 mcg Subcutaneous Q Fri-1800   diltiazem  300 mg Oral Daily   feeding supplement (NEPRO CARB STEADY)  237 mL Oral BID BM   ferrous sulfate  325 mg Oral Q breakfast   loratadine  10 mg Oral Daily    have reviewed scheduled and prn medications.  Physical Exam: General:NAD, comfortable Heart:RRR, s1s2 nl Lungs:clear b/l, no crackle Abdomen:soft, Non-tender, non-distended Extremities:No edema Dialysis Access: AVG has good thrill and bruit.  Geneal Huebert A Emalia Witkop 06/25/2022,11:41 AM  LOS: 5 days

## 2022-06-25 NOTE — Progress Notes (Signed)
PROGRESS NOTE   Subjective/Complaints:  No new complaints this morning Felt weekend was long Discussed with Hope and she has no new concerns  ROS: denies pain, SOB, CP, HA, Insomnia. Denies N/V   Objective:   No results found. No results for input(s): "WBC", "HGB", "HCT", "PLT" in the last 72 hours.  No results for input(s): "NA", "K", "CL", "CO2", "GLUCOSE", "BUN", "CREATININE", "CALCIUM" in the last 72 hours.   Intake/Output Summary (Last 24 hours) at 06/25/2022 0947 Last data filed at 06/25/2022 0419 Gross per 24 hour  Intake 660 ml  Output 1350 ml  Net -690 ml        Physical Exam: Vital Signs Blood pressure 125/84, pulse 72, temperature 97.8 F (36.6 C), resp. rate 18, height 5\' 8"  (1.727 m), weight 70.8 kg, SpO2 95 %.  General: No acute distress Mood and affect are appropriate Heart: Tachycardic Lungs: Clear to auscultation, breathing unlabored, no rales or wheezes Abdomen: Positive bowel sounds, soft nontender to palpation, nondistended Extremities: No clubbing, cyanosis, or edema   Neurological:     Mental Status: He is alert and oriented to person, place, Month and day but not date . Decreased short term memory. No focal deficits MMT - 4/5 in BUE and BLE   Assessment/Plan: 1. Functional deficits which require 3+ hours per day of interdisciplinary therapy in a comprehensive inpatient rehab setting. Physiatrist is providing close team supervision and 24 hour management of active medical problems listed below. Physiatrist and rehab team continue to assess barriers to discharge/monitor patient progress toward functional and medical goals  Care Tool:  Bathing    Body parts bathed by patient: Right arm, Left arm, Chest, Abdomen, Front perineal area, Buttocks, Right upper leg, Left upper leg, Right lower leg, Left lower leg, Face         Bathing assist Assist Level: Supervision/Verbal cueing      Upper Body Dressing/Undressing Upper body dressing   What is the patient wearing?: Pull over shirt    Upper body assist Assist Level: Set up assist    Lower Body Dressing/Undressing Lower body dressing      What is the patient wearing?: Pants, Incontinence brief     Lower body assist Assist for lower body dressing: Contact Guard/Touching assist     Toileting Toileting    Toileting assist Assist for toileting: Supervision/Verbal cueing     Transfers Chair/bed transfer  Transfers assist     Chair/bed transfer assist level: Contact Guard/Touching assist     Locomotion Ambulation   Ambulation assist      Assist level: Contact Guard/Touching assist Assistive device: Rollator Max distance: 285   Walk 10 feet activity   Assist     Assist level: Contact Guard/Touching assist Assistive device: Rollator   Walk 50 feet activity   Assist    Assist level: Contact Guard/Touching assist Assistive device: Rollator    Walk 150 feet activity   Assist Walk 150 feet activity did not occur: Safety/medical concerns (fatigue, decreased balance/coordination)  Assist level: Contact Guard/Touching assist Assistive device: Rollator    Walk 10 feet on uneven surface  activity   Assist     Assist level: Minimal Assistance -  Patient > 75% Assistive device: Other (comment) (L handrail)   Wheelchair     Assist Is the patient using a wheelchair?: Yes Type of Wheelchair: Manual    Wheelchair assist level: Supervision/Verbal cueing Max wheelchair distance: 116ft    Wheelchair 50 feet with 2 turns activity    Assist        Assist Level: Supervision/Verbal cueing   Wheelchair 150 feet activity     Assist      Assist Level: Supervision/Verbal cueing   Blood pressure 125/84, pulse 72, temperature 97.8 F (36.6 C), resp. rate 18, height 5\' 8"  (1.727 m), weight 70.8 kg, SpO2 95 %.  Medical Problem List and Plan: 1. 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             -patient may shower             -ELOS/Goals: 5-7 days             Order grounds pass  -Continue CIR  -Estimated discharge 5/30  -Ambulated 180 ft x2 with PT  2.  Antithrombotics: -DVT/anticoagulation:  Pharmaceutical: Eliquis             -antiplatelet therapy: N/A  3. Pain Management:  Tylenol prn  4. Mood/Behavior/Sleep: LCSW to follow for evaluation and support.              --insomnia-->no issues with sleeping. Will add melatonin prn.              -antipsychotic agents: N/A  5. Neuropsych/cognition: This patient is capable of making decisions on his own behalf. 6. Skin/Wound Care: Routine pressure relief measures.    7. Fluids/Electrolytes/Nutrition: Strict I/O. 1200 cc FR/day             --renal diet   8. ESRD: HD at the end of the day to help with tolerance of therapy on MWF             --strict I/O. Daily weights. Renal diet w/1200 cc FR.    9. A fib with RVR: Monitor HR TID--continue Cardizem for rate control. Magnesium reviewed and was previously low.  -- Continue Eliquis for CVA prophylaxis   10. Neurogenic bladder s/p SPC: Does not remember why or who?  --SPC has not been changed for years per patient.   11. Anemia of chronic disease: CBC with HD session and aranesp every Friday for erythropoiesis. --transfusion prn symptoms. Hgb stable in 7-8 range.     12. Code status: DNR w/full scope of treatment. --per Palliative care w/option on comfort measures if he feels that he wants to stop HD.    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    14. Screening for vitamin D deficiency: check vitamin D level, added on  -5/24 will start 1000u vit D daily, Pt has been started on calcitriol 0.5 mcg by nephrology, this should provide activated vit D also, discussed with pharmacy- appreciate assistance    15. Iron deficiency anemia: add iron supplement    LOS: 5 days A FACE TO FACE EVALUATION  WAS PERFORMED  Drema Pry Kellie Chisolm 06/25/2022, 9:47 AM

## 2022-06-25 NOTE — Progress Notes (Signed)
Occupational Therapy Session Note  Patient Details  Name: Glenn Olson MRN: [id number] Date of Birth: 05-01-1940  Today's Date: 06/25/2022 OT Individual Time: 7829-5621 & 1120-1200 OT Individual Time Calculation (min): 72 min & 40 min   Short Term Goals: Week 1:  OT Short Term Goal 1 (Week 1): STG = LTG d/t ELOS  Skilled Therapeutic Interventions/Progress Updates:  Session 1 Skilled OT intervention completed with focus on ADL retraining, functional transfers and safety education at the shower level. Pt received upright in bed, agreeable to session. No pain reported.  Pt agreeable to shower in prep for full shower routine at home. Completed bed mobility with mod I to EOB, assist only provided for catheter cord. Supervision sit > stand using rollator then CGA ambulatory transfer to commode. No void due to [facility], however small BM smear in brief, but further continent of BM in commode; charted.   Per nursing, OT applied foam dressing to RUQ, where HD port was removed, prior to shower as pt only has fistula now. Able to bathe with overall supervision, at the CGA sit > stand level using grab bar for peri-areas. Education provided on energy conservation methods such staying seated, maintaining water temp at lukewarm level, and extended rest breaks. CGA sit > stand and ambulatory transfer > EOB using rollator.   Extended rest break needed for fatigue at EOB and throughout session. Donned shirt with set up A, CGA for LB including brief/pants however mod cues throughout for sequencing of fasteners, SPC cord management. Stood with CGA using rollator. Pt expressed need to recline back due to postural fatigue; able to do so with mod I. Discussed DME needs for DC with attempt to call son however did not pick up as son is renovating his tub shower to walk in and will need somewhere to sit during shower. Does not prefer BSC for night time toileting but did discuss night time safety for toileting.  Vitals HR  at rest- 108 bpm HR with activity- 123 bpm, O2 sat- 89% but rebounded > 93% with pursed lip breathing and rest  Pt remained upright in bed, with bed alarm on/activated, and with all needs in reach at end of session.  Session 2 Skilled OT intervention completed with focus on ambulatory and cardiovascular endurance. Pt received upright in bed, asleep, easily woken, expressed fatigue but agreeable to session. No pain reported.  Transitioned to EOB with mod I, assist only for [facility] cord. Donned slide on shoes as pt usually wears shoes at home; total A for time. Supervision sit > stand using rollator, then supervision ambulatory transfer about 150 ft with rollator to gym.   Completed 2 x 5 min intervals (on level 4, then level 6) on nustep for global endurance. Intermittent rest break provided for fatigue, however with mild SOB. Nephrology present for assessment.  Supervision sit > stand and ambulatory transfer back to room using rollator, with pt challenged to locate his own room to promote upright gaze for trunk extension and for greater balance. Needed max cues to locate room. Cues needed to lock brakes and center the rollator to hips prior to sitting EOB. Mod I transition to upright in bed.  Vitals HR at rest- 90 bpm HR with activity- 123 bpm, O2 sat- 98%  Pt remained upright in bed, with bed alarm on/activated, and with all needs in reach at end of session.   Therapy Documentation Precautions:  Precautions Precautions: Fall Precaution Comments: watch HR Restrictions Weight Bearing Restrictions: No  Therapy/Group: Individual Therapy  Melvyn Novas, MS, OTR/L  06/25/2022, 12:23 PM

## 2022-06-25 NOTE — Progress Notes (Signed)
Physical Therapy Session Note  Patient Details  Name: Glenn Olson MRN: [id number] Date of Birth: 1941-01-10  Today's Date: 06/25/2022 PT Individual Time: 4098-1191 PT Individual Time Calculation (min): 70 min   Short Term Goals: Week 1:  PT Short Term Goal 1 (Week 1): STG=LTG due to LOS  Skilled Therapeutic Interventions/Progress Updates:   Received pt semi-reclined in bed asleep. Pt easily aroused and agreeable to PT treatment and denied any pain during session. Session with emphasis on functional mobility/transfers, generalized strengthening and endurance, dynamic standing balance/coordination, NMR, stair navigation, and gait training. HR remained 126-131bpm throughout session. Pt transferred semi-reclined<>sitting EOB with HOB elevated and use of bedrails with supervision and donned non-skid socks with set up assist. Pt performed all transfers with rollator and supervision throughout session (cues to lock brakes). Pt ambulated 168ft x 2 trials with rollator and close supervision to/from main therapy gym. Pt then navigated to/from staircase and navigated 16 3in steps with bilateral handrails and supervision ascending and descending with a step to pattern. Pt reports having 1 STE home and 2 STE den without rails or walls close by for support. Took seated rest break then navigated additional 8 3in steps with min HHA ascending and descending with a step to pattern. Pt reports having SPC at home; therefore recommended he use SPC on steps at home or have his son provide HHA - pt verbalized understanding and in agreement.   Pt performed the following activities on [employer] with emphasis on dynamic standing balance/coordination, spatial awareness, and proprioception with CGA for balance:  -weight shift training on level static for 1.5 minutes with BUE support fading to 1 UE support, then no UE support -limits of stability training on level static for 1.5 minutes with BUE support  -limits of stability  training on level static for 1.5 minutes with 1 UE support Pt then ambulated additional 568ft x 2 trials to/from [address] with rollator and close supervision. Pt did not require any standing rest breaks but HR increased to 131bpm after ambulating, decreasing to 128bpm with extended seated rest break. Pt then performed standing mini squats 2x10 with BUE support and CGA with emphasis on quad strength. Returned to room and requested to return to bed - transferred sit<>semi-reclined with HOB elevated and supervision. Concluded session with pt semi-reclined in bed, needs within reach, and bed alarm on.   Therapy Documentation Precautions:  Precautions Precautions: Fall Precaution Comments: watch HR Restrictions Weight Bearing Restrictions: No  Therapy/Group: Individual Therapy Marlana Salvage Zaunegger Blima Rich PT, DPT 06/25/2022, 7:03 AM

## 2022-06-25 NOTE — Progress Notes (Signed)
Received patient in bed to unit.  Alert and oriented.  Informed consent signed and in chart.   TX duration: 3.5hrs  Patient tolerated well.  Alert, without acute distress.  Hand-off given to patient's nurse.   Access used: L AVG Access issues: None  Total UF removed: Medication(s) given: None  Post HD weight: 74kg   06/25/22 1751  Vitals  Temp (!) 97.5 F (36.4 C)  Temp Source Oral  BP 125/70  MAP (mmHg) 78  BP Location Right Arm  BP Method Automatic  Patient Position (if appropriate) Lying  Pulse Rate 86  Pulse Rate Source Monitor  ECG Heart Rate (!) 110  Resp 17  Oxygen Therapy  SpO2 97 %  O2 Device Room Air  During Treatment Monitoring  Intra-Hemodialysis Comments Tx completed  Post Treatment  Dialyzer Clearance Heavily streaked  Duration of HD Treatment -hour(s) 3.5 hour(s)  Liters Processed 79.3  Fluid Removed (mL) 1300 mL  Tolerated HD Treatment Yes     [facility]

## 2022-06-26 MED ORDER — CHLORHEXIDINE GLUCONATE CLOTH 2 % EX PADS
6.0000 | MEDICATED_PAD | Freq: Every day | CUTANEOUS | Status: DC
  Administered 2022-06-27 – 2022-06-28 (×2): 6 via TOPICAL

## 2022-06-26 NOTE — Progress Notes (Signed)
Occupational Therapy Session Note  Patient Details  Name: Glenn Olson MRN: [id number] Date of Birth: 06/15/1940  Today's Date: 06/26/2022 OT Individual Time: 1105-1200 OT Individual Time Calculation (min): 55 min    Short Term Goals: Week 1:  OT Short Term Goal 1 (Week 1): STG = LTG d/t ELOS  Skilled Therapeutic Interventions/Progress Updates:  Skilled OT intervention completed with focus on ambulatory endurance, balance, blocked practice sit > stands, simulated toileting activity. Pt received upright in bed, agreeable to session. No pain reported.  Pt was noted to be of brighter affect today, engaging in conversation about his preferred topics.   Transitioned to EOB with supervision, cues needed for [facility] cord management to prevent tugging on thigh piece. Donned slip on shoes with max A for time. Nurse notified of elevated HR at rest, as pt has been trending with > 125 bpm with activity not rest. Nurse planning to check in with MD about additional meds or parameters for pt to stay within.  Stood with supervision using rollator, with brief noted to be halfway down his leg therefore ambulated with supervision to commode for switch out brief as fastener had ripped. Able to doff pants/brief with supervision, however max cues and mod A to thread new brief. Discussed pull up options for ease with donning but reviewed need to thread SPC through. Donned LB clothing over hips with supervision.  Ambulated with supervision using rollator > day room, with cues for bilateral foot clearance, upright gaze and scapular depression.   Seated EOM, pt then participated in simulated toileting task to promote functional reaching behind back and efficiency with sit > stand for independence with toileting needs at DC. Reached behind back with either hand to retrieve squigz then supervision sit > stand (1 stand per piece) using rollator for placement on long mirror. Discomfort expressed in B knees due to baseline  arthritis, requiring intermittent rest breaks for pain management and fatigue. Able to complete about 10 sit > stands, then downgraded task to only retrieving pieces while seated then placing and removing to return to box behind back,  Ambulated back to room with prior assist/cues, however incorporated visual component for promoting upright gaze and greater balance with visual scavenger hunt. Did miss 2 items in peripheral view and needed assist to locate/recognize room. Doffed shoes with mod I at EOB, then supervision bed mobility to upright in bed.  Vitals -At rest- 123 bpm -With ambulation/activity- 126 bpm  Pt remained upright in bed, with bed alarm on/activated, and with all needs in reach at end of session.   Therapy Documentation Precautions:  Precautions Precautions: Fall Precaution Comments: watch HR Restrictions Weight Bearing Restrictions: No    Therapy/Group: Individual Therapy  Melvyn Novas, MS, OTR/L  06/26/2022, 12:06 PM

## 2022-06-26 NOTE — Progress Notes (Signed)
Physical Therapy Session Note  Patient Details  Name: Glenn Olson MRN: [id number] Date of Birth: January 05, 1941  Today's Date: 06/26/2022 PT Individual Time: 5621-3086 PT Individual Time Calculation (min): 44 min   Short Term Goals: Week 1:  PT Short Term Goal 1 (Week 1): STG=LTG due to LOS  Skilled Therapeutic Interventions/Progress Updates:  Patient supine in bed on entrance to room. Friend/ neighbor present and exiting on start of session. Patient alert and agreeable to PT session.   Pt's friend is completing remodel to bathroom and relates that he is not installing a seat in the shower but the shower is bathtub length and bench/ seat has already been purchased with a back on it. Rails by the toilet. Rail in shower.   Patient with no pain complaint at start of session. Does relate fatigue.   Therapeutic Activity: Bed Mobility: Pt performed supine <> sit with supervisoin/ Mod I and manages bed linens. No cueing required.  Transfers: Pt performed sit<>stand and stand pivot transfers throughout session using rollator with CGA/ close supervision. Provided vc for safe brake application of rollator during transfers.   Gait Training/ NMR:  Pt ambulated to/ from day room using rollator with CGA/ close supervision and demonstrating flexed posture. Provided vc/ tc for upright posture and level gaze.   Guided pt in collection of weighted balls from day room. Balls placed in increasing more difficult positions to approach safety and reach safely. Requiring one vc for application of brakes forsafe use of rollator to use for stability in reaching outside of BOS. Then pt applying brakes for each successive reach to ball. Collects balls from almost floor level to head level and is able to collect balls in bag of rollator.   NMR performed for improvements in motor control and coordination, balance, sequencing, judgement, and self confidence/ efficacy in performing all aspects of mobility at highest level  of independence.   Then guided in TUG test even though pt relates that he does not plan to continue with therapy after d/c. Discussed with pt re: need to continue movement each day and recommended to collect mail each day for continued ambulation in order to maintain pt's CLOF.   TUG completed 29.5 seconds (A score of >13.5 seconds indicates patient is at a high fall risk. Pt educated on interpretation of their score)    Patient supine in bed at end of session with brakes locked, bed alarm set, and all needs within reach.   Therapy Documentation Precautions:  Precautions Precautions: Fall Precaution Comments: watch HR Restrictions Weight Bearing Restrictions: No General:   Vital Signs: Therapy Vitals Temp: 98.1 F (36.7 C) Temp Source: Oral Pulse Rate: 81 Resp: 18 BP: (!) 113/57 Patient Position (if appropriate): Lying Oxygen Therapy SpO2: 98 % O2 Device: Room Air Pain:  Minimal pain complaint this session. Mainly complaining of low tolerance to activity and fatigue.  Therapy/Group: Individual Therapy  Loel Dubonnet PT, DPT, CSRS 06/26/2022, 8:52 AM

## 2022-06-26 NOTE — Progress Notes (Signed)
Case discussed with nephrologist and CSW. Pt's d/c date is 5/30. Contacted [facility] and spoke to [place]. Clinic to return call to navigator with pt's confirmed chair time after discussing with RN at clinic. Awaiting return call. Clinic open to pt starting at clinic on Saturday if pt/family can come on Friday to clinic to complete paperwork. Will discuss with pt/family once clinic provides pt's confirmed chair time.   Olivia Canter Renal Navigator [phone]

## 2022-06-26 NOTE — Progress Notes (Signed)
Physical Therapy Session Note  Patient Details  Name: Glenn Olson MRN: [id number] Date of Birth: 12/25/1940  Today's Date: 06/26/2022 PT Individual Time: 0951-1050 PT Individual Time Calculation (min): 59 min   Short Term Goals: Week 1:  PT Short Term Goal 1 (Week 1): STG=LTG due to LOS  Skilled Therapeutic Interventions/Progress Updates: Patient supine in bed on entrance to room. Patient alert and agreeable to PT session.   Patient reported no pain at beginning of PT session.  Therapeutic Activity: Bed Mobility: Pt performed supine<>sit on EOB with HOB elevated and supervision/modI for safety. Transfers: Pt performed sit<>stand transfers throughout session with CGA for safety (bed slightly elevated). Provided VC for anterior scoot and to lock rollator.  Gait Training:  Pt ambulated 140' using rollator with supervision/modI for safety from room to day room gym. Seated rest break provided per patient report of SOB (vitals checked). Patient provided with pursed lip breathing technique. Patient then ambulated from day room gym to main gym with cues to increase stepping clearance with provided education to the safety mechanism behind cue.  - Stair navigation (6" step first 2 ascending/descending) with use of R UE on hand rail when ascending (step to), and B UE on hand rail when ascending with R lateral lean on rail. Patient provided with seated rest break after with HR and SpO2 remaining the same throughout. Patient then ascended/descended 3" steps (first 3) with use of R UE on hand rail (step to pattern).  - Patient reported to use RW at home to navigate stairs. Patient demonstrated on 3" (about the height of stairs at home environment) steps how pt reported how he will do it after d/c (regardless of what we instruct). PTA wanted to see if it was going to be safe. Patient ascended/descended stairs while being inside RW (front wheel on advancing step). Patient cued to change hand placement as to  avoid from shifting weight to tip RW. Ascending was deemed to be safe, but descending still requires more practice as patient stated that have the front of walker where the wheels are close to the edge of the next step because it is easier to step down (versus how PTA cued and instructed patient to ensure that the rear legs of RW are touching the base of the previous step. Supervising PT alerted to this in order to add to the assessment of patient's ability to safely navigate stairs at home. - Patient then ambulated back to room from main gym (150'+) with supervision.   Patient supine in bed at end of session with brakes locked, bed alarm set, and all needs within reach.      Therapy Documentation Precautions:  Precautions Precautions: Fall Precaution Comments: watch HR Restrictions Weight Bearing Restrictions: No  Vitals:  HR - 125bpm at rest (beginning of session) HR - 125 after ambulation (99% SpO2) and reports of SOB HR - 125 after 2nd gait trial (99% SpO2) and reports of SOB  Therapy/Group: Individual Therapy  Broady Lafoy PTA 06/26/2022, 3:09 PM

## 2022-06-26 NOTE — Progress Notes (Signed)
Patient ID: Glenn Olson, male   DOB: [birth year], 82 y.o.   MRN: [id number]  Sw spoke with pt's son, Nadine Counts to discuss [facility] preferences for d/c. Sw informed on that currently pt has no DME recommendations. SW will confirm 5/30 d/c date with son tomorrow after conference, no additional questions or concerns.

## 2022-06-26 NOTE — Progress Notes (Addendum)
[facility] NEPHROLOGY PROGRESS NOTE  Assessment/ Plan: Pt is a 82 y.o. yo male ESRD on HD now in CIR for rehab.  # ESRD: It looks like he was off of dialysis for some time, looked into PD but not an option because of sedation. Now he is getting dialysis MWF schedule.  Outpatient HD arranged at [facility] TTS 6:10 AM. Status post HD on 5/27 with 1.3 L ultrafiltration.  Plan on dialysis Thursday to change to TTS schedule. He tells me that he is also slated to go home on Thursday.  Have talked to renal navigator-  will plan for HD here Wed afternoon so he can go home first thing on Thurs-  then will need to sign papers at the kidney center on Friday in order to start there on Saturday   # Anemia of CKD: Received IV iron and now on ESA.  Monitor hemoglobin.  # Secondary hyperparathyroidism/hyperphosphatemia: On PhosLo, calcitriol-  to continue  # HTN/volume: Blood pressure and volume status acceptable. On cardizem only  Subjective: Seen participating in rehab.  HD yest-  removed 1300-  says he does not like dialysis but nothing specific  Objective Vital signs in last 24 hours: Vitals:   06/25/22 2346 06/25/22 2348 06/26/22 0500 06/26/22 0520  BP:    (!) 113/57  Pulse: (!) 107 87  81  Resp:    18  Temp:    98.1 F (36.7 C)  TempSrc:    Oral  SpO2:    98%  Weight:   72.7 kg   Height:       Weight change: 4.5 kg  Intake/Output Summary (Last 24 hours) at 06/26/2022 1045 Last data filed at 06/26/2022 0700 Gross per 24 hour  Intake 827 ml  Output 2175 ml  Net -1348 ml       Labs: RENAL PANEL Recent Labs  Lab 06/20/22 0753 06/25/22 1405  NA 133* 137  K 4.5 3.7  CL 98 98  CO2 20* 21*  GLUCOSE 94 145*  BUN 84* 97*  CREATININE 6.42* 7.41*  CALCIUM 7.7* 8.3*  PHOS 6.9* 6.9*  ALBUMIN 3.1* 3.2*    Liver Function Tests: Recent Labs  Lab 06/20/22 0753 06/25/22 1405  ALBUMIN 3.1* 3.2*   No results for input(s): "LIPASE", "AMYLASE" in the last 168 hours. No  results for input(s): "AMMONIA" in the last 168 hours. CBC: Recent Labs    06/01/22 0759 06/02/22 0411 06/14/22 0154 06/15/22 0202 06/19/22 0843 06/20/22 0753 06/25/22 1405  HGB 6.7*   < > 8.1* 8.0* 8.5* 7.6* 8.4*  MCV 87.3   < > 96.3 95.6  --  96.8 94.3  TIBC 197*  --   --   --   --   --   --   IRON 31*  --   --   --   --   --   --    < > = values in this interval not displayed.    Cardiac Enzymes: No results for input(s): "CKTOTAL", "CKMB", "CKMBINDEX", "TROPONINI" in the last 168 hours. CBG: Recent Labs  Lab 06/23/22 1204 06/23/22 2124 06/24/22 0620 06/24/22 2137 06/25/22 1201  GLUCAP 91 113* 80 139* 81    Iron Studies: No results for input(s): "IRON", "TIBC", "TRANSFERRIN", "FERRITIN" in the last 72 hours. Studies/Results: No results found.  Medications: Infusions:   Scheduled Medications:  apixaban  2.5 mg Oral BID   calcitRIOL  0.5 mcg Oral Daily   calcium acetate  667 mg Oral TID  WC   carbamide peroxide  5 drop Both EARS TID   Chlorhexidine Gluconate Cloth  6 each Topical Q0600   Chlorhexidine Gluconate Cloth  6 each Topical Q0600   cholecalciferol  1,000 Units Oral Daily   [START ON 06/29/2022] darbepoetin (ARANESP) injection - DIALYSIS  200 mcg Subcutaneous Q Fri-1800   diltiazem  300 mg Oral Daily   feeding supplement (NEPRO CARB STEADY)  237 mL Oral BID BM   ferrous sulfate  325 mg Oral Q breakfast   loratadine  10 mg Oral Daily    have reviewed scheduled and prn medications.  Physical Exam: General:NAD, comfortable Heart:RRR, s1s2 nl Lungs:clear b/l, no crackle Abdomen:soft, Non-tender, non-distended Extremities:No edema Dialysis Access: AVG has good thrill and bruit.  Sarahanne Novakowski A Joelynn Dust 06/26/2022,10:45 AM  LOS: 6 days

## 2022-06-26 NOTE — Progress Notes (Signed)
PROGRESS NOTE   Subjective/Complaints: No new complaints this morning HR 120s resting, discussed with nursing parameters of 135 when working with therapy  ROS: denies pain, SOB, CP, HA, Insomnia. Denies N/V   Objective:   No results found. Recent Labs    06/25/22 1405  WBC 6.9  HGB 8.4*  HCT 26.7*  PLT 250    Recent Labs    06/25/22 1405  NA 137  K 3.7  CL 98  CO2 21*  GLUCOSE 145*  BUN 97*  CREATININE 7.41*  CALCIUM 8.3*     Intake/Output Summary (Last 24 hours) at 06/26/2022 1245 Last data filed at 06/26/2022 0700 Gross per 24 hour  Intake 650 ml  Output 2175 ml  Net -1525 ml        Physical Exam: Vital Signs Blood pressure (!) 113/57, pulse 81, temperature 98.1 F (36.7 C), temperature source Oral, resp. rate 18, height 5\' 8"  (1.727 m), weight 72.7 kg, SpO2 98 %.  General: No acute distress, BMI 24.37 Mood and affect are appropriate Heart: Tachycardic Lungs: Clear to auscultation, breathing unlabored, no rales or wheezes Abdomen: Positive bowel sounds, soft nontender to palpation, nondistended Extremities: No clubbing, cyanosis, or edema   Neurological:     Mental Status: He is alert and oriented to person, place, Month and day but not date . Decreased short term memory. No focal deficits MMT - 4/5 in BUE and BLE   Assessment/Plan: 1. Functional deficits which require 3+ hours per day of interdisciplinary therapy in a comprehensive inpatient rehab setting. Physiatrist is providing close team supervision and 24 hour management of active medical problems listed below. Physiatrist and rehab team continue to assess barriers to discharge/monitor patient progress toward functional and medical goals  Care Tool:  Bathing    Body parts bathed by patient: Right arm, Left arm, Chest, Abdomen, Front perineal area, Buttocks, Right upper leg, Left upper leg, Right lower leg, Left lower leg, Face          Bathing assist Assist Level: Supervision/Verbal cueing     Upper Body Dressing/Undressing Upper body dressing   What is the patient wearing?: Pull over shirt    Upper body assist Assist Level: Set up assist    Lower Body Dressing/Undressing Lower body dressing      What is the patient wearing?: Pants, Incontinence brief     Lower body assist Assist for lower body dressing: Contact Guard/Touching assist     Toileting Toileting    Toileting assist Assist for toileting: Supervision/Verbal cueing     Transfers Chair/bed transfer  Transfers assist     Chair/bed transfer assist level: Supervision/Verbal cueing     Locomotion Ambulation   Ambulation assist      Assist level: Supervision/Verbal cueing Assistive device: Rollator Max distance: 571ft   Walk 10 feet activity   Assist     Assist level: Supervision/Verbal cueing Assistive device: Rollator   Walk 50 feet activity   Assist    Assist level: Supervision/Verbal cueing Assistive device: Rollator    Walk 150 feet activity   Assist Walk 150 feet activity did not occur: Safety/medical concerns (fatigue, decreased balance/coordination)  Assist level: Supervision/Verbal cueing Assistive  device: Rollator    Walk 10 feet on uneven surface  activity   Assist     Assist level: Minimal Assistance - Patient > 75% Assistive device: Other (comment) (L handrail)   Wheelchair     Assist Is the patient using a wheelchair?: Yes Type of Wheelchair: Manual    Wheelchair assist level: Supervision/Verbal cueing Max wheelchair distance: 129ft    Wheelchair 50 feet with 2 turns activity    Assist        Assist Level: Supervision/Verbal cueing   Wheelchair 150 feet activity     Assist      Assist Level: Supervision/Verbal cueing   Blood pressure (!) 113/57, pulse 81, temperature 98.1 F (36.7 C), temperature source Oral, resp. rate 18, height 5\' 8"  (1.727 m), weight  72.7 kg, SpO2 98 %.  Medical Problem List and Plan: 1. 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             -patient may shower             -ELOS/Goals: 5-7 days             Order grounds pass  -Continue CIR  -Estimated discharge 5/30  -Ambulated 180 ft x2 with PT  2.  Antithrombotics: -DVT/anticoagulation:  Pharmaceutical: Eliquis             -antiplatelet therapy: N/A  3. Pain Management:  Tylenol prn  4. Mood/Behavior/Sleep: LCSW to follow for evaluation and support.              --insomnia-->no issues with sleeping. Will add melatonin prn.              -antipsychotic agents: N/A  5. Neuropsych/cognition: This patient is capable of making decisions on his own behalf. 6. Skin/Wound Care: Routine pressure relief measures.    7. Fluids/Electrolytes/Nutrition: Strict I/O. 1200 cc FR/day             --renal diet   8. ESRD: HD at the end of the day to help with tolerance of therapy on MWF             --strict I/O. Daily weights. Continue Renal diet w/1200 cc FR.    9. A fib with RVR: Monitor HR TID--continue Cardizem for rate control. Magnesium reviewed and was previously low.  -- Continue Eliquis for CVA prophylaxis   10. Neurogenic bladder s/p SPC: Does not remember why or who?  --SPC has not been changed for years per patient.   11. Anemia of chronic disease: CBC with HD session and aranesp every Friday for erythropoiesis. --transfusion prn symptoms. Hgb stable in 7-8 range.     12. Code status: DNR w/full scope of treatment. --per Palliative care w/option on comfort measures if he feels that he wants to stop HD.    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    14. Screening for vitamin D deficiency: check vitamin D level, added on  -5/24 will start 1000u vit D daily, Pt has been started on calcitriol 0.5 mcg by nephrology, this should provide activated vit D also, discussed with pharmacy- appreciate assistance    15. Iron  deficiency anemia: add iron supplement    LOS: 6 days A FACE TO FACE EVALUATION WAS PERFORMED  Drema Pry Orean Giarratano 06/26/2022, 12:45 PM

## 2022-06-26 NOTE — Progress Notes (Signed)
Physical Therapy Session Note  Patient Details  Name: Glenn Olson MRN: [id number] Date of Birth: 23-Dec-1940  Today's Date: 06/26/2022 PT Individual Time: 0816-0855 PT Individual Time Calculation (min): 39 min   Short Term Goals: Week 1:  PT Short Term Goal 1 (Week 1): STG=LTG due to LOS  Skilled Therapeutic Interventions/Progress Updates:   Received pt semi-reclined in bed, pt agreeable to PT treatment, and denied any pain during session. Session with emphasis on functional mobility/transfers, generalized strengthening and endurance, dynamic standing balance/coordination, and gait training. HR remained 128-129bpm throughout session. Pt performed bed mobility with HOB elevated and use of bedrails and supervision x 2 trials throughout session. Donned non-skid socks with set up assist in figure four position while sitting EOB. Pt performed all transfers with rollator and supervision throughout session - cues for brake management. Pt then ambulated 164ft x 2 trials with rollator and supervision to/from dayroom - reminder to lock brakes prior to sitting. Discussed D/C date of Thursday and pt very excited. Went through pain interference, sensation, and MMT in preparation for discharge.   Worked on dynamic standing balance performing alternating toe taps to 3in step 2x20 with mod fading to min HHA. Then transitioned to standing heel taps to 3in step 2x20 with min HHA with rest breaks in between. Pt able to stand and pick up tissue box with reacher without AD and supervision - pt reports having 3 reachers at home. Pt then performed 2x10 blocked practice sit<>stands to fatigue with heavy reliance on BUE support and CGA for balance with emphasis on quad strength. Pt reported 6/10 fatigue after activity. Returned to room and pt requested to return to bed. Concluded session with pt semi-reclined in bed, needs within reach, and bed alarm on.   Therapy Documentation Precautions:  Precautions Precautions:  Fall Precaution Comments: watch HR Restrictions Weight Bearing Restrictions: No  Therapy/Group: Individual Therapy Glenn Olson Blima Rich PT, DPT 06/26/2022, 6:56 AM

## 2022-06-26 NOTE — Discharge Summary (Signed)
Physician Discharge Summary  Patient ID: Glenn Olson MRN: [id number] DOB/AGE: December 12, 1940 82 y.o.  Admit date: 06/20/2022 Discharge date: 06/28/2022  Discharge Diagnoses:  Principal Problem:   Debility Active Problems:   Atonic neurogenic bladder   ESRD on hemodialysis (HCC)   Chronic indwelling Foley catheter   A-fib (HCC)   Malnutrition of moderate degree   Discharged Condition: stable  Significant Diagnostic Studies:   Labs:  Basic Metabolic Panel: Recent Labs  Lab 06/25/22 1405 06/27/22 1220  NA 137 134*  K 3.7 3.9  CL 98 95*  CO2 21* 20*  GLUCOSE 145* 98  BUN 97* 89*  CREATININE 7.41* 7.08*  CALCIUM 8.3* 9.1  PHOS 6.9* 6.1*    CBC: Recent Labs  Lab 06/25/22 1405 06/27/22 1220  WBC 6.9 6.5  HGB 8.4* 8.6*  HCT 26.7* 27.3*  MCV 94.3 94.5  PLT 250 240    CBG: Recent Labs  Lab 06/23/22 1204 06/23/22 2124 06/24/22 0620 06/24/22 2137 06/25/22 1201  GLUCAP 91 113* 80 139* 81    Brief HPI:   Glenn Olson is a 82 y.o. male with history of ESRD with anemia, neurogenic bladder who quit all meds as well as hemodialysis 14 months ago.  He was admitted on 05/30/2022 with 1 week history of headache, difficulty breathing with low energy levels as well as chest pressure with lack of urine production.  Family also reported progressive decline in acuity for a month with decrease in ambulation.  He was noted to be uremic with BUN up to 241, bicarb less than 7, creatinine 13.8 and was treated with IV fluids as well as bicarb drip.  CT head was negative for acute changes.  Tunneled hemodialysis cath was placed by Dr. Myra Gianotti and he was transferred to [facility] for hemodialysis/medical management.  Hospital course significant for A-fib with RVR initially treated with diltiazem however he had recurrent issues with flutter which was difficult to control therefore underwent DCCV to NSR on 05/21 by Dr. Charlann Boxer.  Anemia of chronic disease with as well as evidence.  PT  OT was working with patient was limited by weakness with fatigue due to debility.  CIR was recommended due to functional decline.   Hospital Course: Glenn Olson was admitted to rehab 06/20/2022 for inpatient therapies to consist of PT  and OT at least three hours five days a week. Past admission physiatrist, therapy team and rehab RN have worked together to provide customized collaborative inpatient rehab.  Hemodialysis has been ongoing on MWF. He is to transition to TTS after discharge. Vitamin D supplement was added for vitamin D deficiency. Blood pressures were monitored on TID basis and have been on low side but stable.  Heart rate has been controlled on current dose Cardizem.   His p.o. intake has improved.  Weights were monitored daily and no signs of overload noted.  Respiratory status has been stable.  Suprapubic catheter was changed out at admission.  He has made great progress during his stay and is modified independent at discharge.  He will continue to receive follow-up home health PT and OT  by [facility] after discharge.   Rehab course: During patient's stay in rehab weekly team conferences were held to monitor patient's progress, set goals and discuss barriers to discharge. At admission, patient required standby to min assist for ADL tasks and min assist for mobility. He  has had improvement in activity tolerance, balance, postural control as well as ability to compensate for deficits.  He is able to complete ADL tasks at modified independent to supervision level.  He is modified for transfers and is able to ambulate 150 feet with use of rollator.  He requires supervision to climb stairs.    Disposition: Home  Diet: Renal.  1200 cc AFR.   Discharge Instructions     Ambulatory referral to Physical Medicine Rehab   Complete by: As directed    Hospital follow up      Allergies as of 06/28/2022   No Known Allergies      Medication List     TAKE these medications     apixaban 2.5 MG Tabs tablet Commonly known as: ELIQUIS Take 1 tablet (2.5 mg total) by mouth 2 (two) times daily.   calcitRIOL 0.5 MCG capsule Commonly known as: ROCALTROL Take 1 capsule (0.5 mcg total) by mouth daily.   calcium acetate 667 MG capsule Commonly known as: PHOSLO Take 1 capsule (667 mg total) by mouth 3 (three) times daily with meals.   diltiazem 300 MG 24 hr capsule Commonly known as: CARDIZEM CD Take 1 capsule (300 mg total) by mouth daily.   feeding supplement (NEPRO CARB STEADY) Liqd Take 237 mLs by mouth 2 (two) times daily between meals.   loratadine 10 MG tablet Commonly known as: CLARITIN Take 1 tablet (10 mg total) by mouth daily. Start taking on: Jun 29, 2022   melatonin 3 MG Tabs tablet Take 1 tablet (3 mg total) by mouth at bedtime as needed.   vitamin D3 25 MCG tablet Commonly known as: CHOLECALCIFEROL Take 1 tablet (1,000 Units total) by mouth daily. Start taking on: Jun 29, 2022        Follow-up Information     [facility] Go on 06/29/2022.   Why: Please go to clinic on Thursday or Friday by 4:00 pm to complete paperwork so pt can start at clinic on Saturday.  Schedule is Tuesday/Thursday/Saturday with 12:40 pm chair time. Contact information: [address] [place] [address] [phone]         Benita Stabile, MD Follow up.   Specialty: Internal Medicine Why: Call in 1-2 days for post hospital follow up Contact information: [address] [place] [facility] [address] [phone]         Horton Chin, MD Follow up.   Specialty: Physical Medicine and Rehabilitation Why: office will call you with follow up appointment Contact information: [address] [place] [address] [phone]                 Signed: Jacquelynn Cree 06/28/2022, 10:44 PM

## 2022-06-27 LAB — CBC
HCT: 27.3 % — ABNORMAL LOW (ref 39.0–52.0)
Hemoglobin: 8.6 g/dL — ABNORMAL LOW (ref 13.0–17.0)
MCH: 29.8 pg (ref 26.0–34.0)
MCHC: 31.5 g/dL (ref 30.0–36.0)
MCV: 94.5 fL (ref 80.0–100.0)
Platelets: 240 10*3/uL (ref 150–400)
RBC: 2.89 MIL/uL — ABNORMAL LOW (ref 4.22–5.81)
RDW: 15.4 % (ref 11.5–15.5)
WBC: 6.5 10*3/uL (ref 4.0–10.5)
nRBC: 0 % (ref 0.0–0.2)

## 2022-06-27 LAB — RENAL FUNCTION PANEL
Albumin: 3.4 g/dL — ABNORMAL LOW (ref 3.5–5.0)
Anion gap: 19 — ABNORMAL HIGH (ref 5–15)
BUN: 89 mg/dL — ABNORMAL HIGH (ref 8–23)
CO2: 20 mmol/L — ABNORMAL LOW (ref 22–32)
Calcium: 9.1 mg/dL (ref 8.9–10.3)
Chloride: 95 mmol/L — ABNORMAL LOW (ref 98–111)
Creatinine, Ser: 7.08 mg/dL — ABNORMAL HIGH (ref 0.61–1.24)
GFR, Estimated: 7 mL/min — ABNORMAL LOW (ref >60)
Glucose, Bld: 98 mg/dL (ref 70–99)
Phosphorus: 6.1 mg/dL — ABNORMAL HIGH (ref 2.5–4.6)
Potassium: 3.9 mmol/L (ref 3.5–5.1)
Sodium: 134 mmol/L — ABNORMAL LOW (ref 135–145)

## 2022-06-27 NOTE — Progress Notes (Signed)
Physical Therapy Session Note  Patient Details  Name: Glenn Olson MRN: [id number] Date of Birth: [birth year]  Today's Date: 06/27/2022 PT Individual Time: [phone] and [id number] PT Individual Time Calculation (min): 58 min and 54 min  Short Term Goals: Week 1:  PT Short Term Goal 1 (Week 1): STG=LTG due to LOS  Skilled Therapeutic Interventions/Progress Updates:   Treatment Session 1 Received pt semi-reclined in bed, pt agreeable to PT treatment, and denied any pain during session. Session with emphasis on functional mobility/transfers, generalized strengthening and endurance, dynamic standing balance/coordination, simulated car transfers, stair navigation, and gait training. Pt performed bed mobility with HOB elevated and mod I x 2 trials throughout session. HR 124bpm while RN present to administer medications. Pt opted to use RW this morning and performed all transfers with RW and mod I throughout session. Pt ambulated 155ft with RW and mod I to main therapy gym - 1 seated rest break for RN to reattach catheter to leg. Pt than navigated 8 3in steps with RW and supervision ascending and descending with a step to pattern - HR 126bpm. Pt then ambulated 180ft with RW and mod I to ortho gym and performed ambulatory simulated car transfer with RW and supervision and ambulated 55ft on uneven surfaces (ramp) with RW and mod I. Pt ambulated 57ft with RW and mod I to rehab apartment and practiced bed mobility from flat bed without bedrails and mod I (need for increased time). Practiced furniture transfers on/off low sitting couch and rocking recliner (pt reports he spends a lot of time in his recliner at home). Ambulated 12ft with RW and mod I back to main therapy gym - HR 126bpm.   Pt performed the following activities inside // bars with emphasis on dynamic standing balance/coordination, LE strength, and endurance: -forward tandem walking along A[employer] with 1 UE support and CGA x37ft -lateral stepping  along Airex with BUE support fading to 1 UE support and CGA x70ft -lateral step ups on 6in step with BUE support and CGA x10 reps bilaterally Pt then ambulated 175ft with RW and mod I back to room and ambulated in/out of bathroom. Pt with incontinent bowel episode - clear musus coming from rectum and NT notified. Removed dirty brief and donned clean one with max A and required mod A for clothing management for time management purposes. Pt requested to return to bed. Concluded session with pt semi-reclined in bed, needs within reach, and bed alarm on.   Treatment Session 2 Received pt semi-reclined in bed and agreeable to PT treatment. Session with emphasis on functional mobility/transfers, generalized strengthening and endurance, dynamic standing balance/coordination, and gait training. HR remained 124bpm throughout session. Pt performed bed mobility with HOB elevated and mod I x 2 trials throughout session. Pt performed all transfers with rollator and supervision/mod I throughout session - intermittent reminders to lock brakes prior to sitting down. Pt ambulated 168ft with rollator and mod I to main therapy gym - noted antalgic gait pattern and pt reported mld discomfort in L knee - RN notified and present to administer pain medication.  Pt performed the following activities in agility ladder with L HHA: -forward walking with step to pattern x 2 laps with min fading to mod HHA -lateral side stepping x 2 laps with mod HHA - pt with difficulty taking large enough steps and stepping on top of ladder MD arrived for morning rounds while pt took seated rest break, then performed mini squats 2x10 with grn TB. Pt ambulated 161ft with  rollator and supervision/mod I to dayroom and performed BUE/LE strengthening on Nustep at workload 4 for 8 minutes for a total of 248 steps with emphasis on cardiovascular endurance and improving aerobic capacity. Pt ambulated 161ft with rollator and supervision/mod I back to room.  Concluded session with pt semi-reclined in bed, needs within reach, and bed alarm on.   Therapy Documentation Precautions:  Precautions Precautions: Fall Precaution Comments: watch HR Restrictions Weight Bearing Restrictions: No  Therapy/Group: Individual Therapy Marlana Salvage Zaunegger Blima Rich PT, DPT 06/27/2022, 6:55 AM

## 2022-06-27 NOTE — Progress Notes (Signed)
Pt currently in HD. SW spoke with pt's son, Nadine Counts. SW provided team conference updates and discharge date of tomorrow 5/30. Son will arrive between 9 AM- 11 AM tomorrow. No DME recommendations. Pt requesting to not continue with HH/OP FU. Son requested a HEP, sw contact information provided in case pt requests FU.

## 2022-06-27 NOTE — Plan of Care (Signed)
  Problem: RH Bathing Goal: LTG Patient will bathe all body parts with assist levels (OT) Description: LTG: Patient will bathe all body parts with assist levels (OT) Outcome: Not Met (add Reason) Flowsheets (Taken 06/27/2022 1136) LTG: Pt will perform bathing with assistance level/cueing: (Not met at mod I due to supervision needed for stance on wet floor when washing periareas) --   Problem: Sit to Stand Goal: LTG:  Patient will perform sit to stand in prep for activites of daily living with assistance level (OT) Description: LTG:  Patient will perform sit to stand in prep for activites of daily living with assistance level (OT) Outcome: Completed/Met   Problem: RH Dressing Goal: LTG Patient will perform upper body dressing (OT) Description: LTG Patient will perform upper body dressing with assist, with/without cues (OT). Outcome: Completed/Met Goal: LTG Patient will perform lower body dressing w/assist (OT) Description: LTG: Patient will perform lower body dressing with assist, with/without cues in positioning using equipment (OT) Outcome: Completed/Met   Problem: RH Toileting Goal: LTG Patient will perform toileting task (3/3 steps) with assistance level (OT) Description: LTG: Patient will perform toileting task (3/3 steps) with assistance level (OT)  Outcome: Completed/Met   Problem: RH Toilet Transfers Goal: LTG Patient will perform toilet transfers w/assist (OT) Description: LTG: Patient will perform toilet transfers with assist, with/without cues using equipment (OT) Outcome: Completed/Met   Problem: RH Tub/Shower Transfers Goal: LTG Patient will perform tub/shower transfers w/assist (OT) Description: LTG: Patient will perform tub/shower transfers with assist, with/without cues using equipment (OT) Outcome: Completed/Met

## 2022-06-27 NOTE — Progress Notes (Signed)
Occupational Therapy Session Note  Patient Details  Name: Glenn Olson MRN: [id number] Date of Birth: Jul 20, 1940  Today's Date: 06/27/2022 OT Individual Time: 0905-1015 OT Individual Time Calculation (min): 70 min    Short Term Goals: Week 1:  OT Short Term Goal 1 (Week 1): STG = LTG d/t ELOS  Skilled Therapeutic Interventions/Progress Updates:      Therapy Documentation Precautions:  Precautions Precautions: Fall Precaution Comments: keep HR <135bpm Restrictions Weight Bearing Restrictions: No  General:  "I don't know how I am doing." Pt supine in bed sleeping upon OT arrival, agreeable to OT session. BIMS/CAMs completed for d/c.   Vital Signs: 120 BPM at rest 124 BPM after activity  Pain: Pt reported slight pain in left knee, pt declined intervention for pain relief.   Transfers:  -supine>EOB mod I with no use of bed rails -sit>< stand mod I for multiple trials  -Pt ambulated mod I with rollator from room >< day room therapy gym with no SOB/LOB.  Exercises: Pt completed the following theraband exercise circuit with yellow theraband in order to improve functional activity, strength and endurance to prepare for ADLs such as bathing. Pt completed the 1 set of 12 reps of the following exercises seated EOM position with no noted LOB/SOB, cues to keep count: -horizontal abduction (2 trials) -bicep curls with BUE (2 trials) -shoulder external rotation with 2-3 second hold at end range (2 trials) - shoulder flexion  -shoulder diagonal pulls -therapist anchored scapular retraction -slow controlled triceps extention  PRN rest breaks during exercises.   Other Treatments:   Pt completed 10 minutes on nustep in order to work in order to increase functional endurance and strength. Pt initiallt reported slight L knee pain but declined intervention for pain. Pt BPM 123 during exercise. Pt completed exercise in slow, consistent pace. Nustep bike set at level 6 resistance. Pt  completed with no rest breaks.   Therapy/Group: Individual Therapy  Melvyn Novas, MS, OTR/L  06/27/2022, 12:20 PM

## 2022-06-27 NOTE — Progress Notes (Signed)
Contacted by [facility] this morning with pt's final schedule. Pt's schedule will be Tuesday,Thursday,Saturday with 12:40 pm chair time. Pt can start at clinic on Saturday if pt/family will go to clinic on Thursday or Friday by 4:00 pm to complete paperwork. Contacted pt's son, Nadine Counts, to discuss above arrangements. Pt's son agreeable to schedule/time and states that family can take pt on Thurs or Fri to clinic to complete paperwork so pt can start on Saturday. Son states that family will provide transport to HD appts at d/c. Update provided to rehab MD, CSW, and nephrologist. Arrangements added to pt's AVS as well. Contacted renal NP to request that orders be sent to clinic for Saturday. Clinic aware that son is able to take pt to clinic on Thurs or Fri to complete paperwork. Will assist as needed.   Olivia Canter Renal Navigator [phone]

## 2022-06-27 NOTE — Progress Notes (Signed)
Occupational Therapy Discharge Summary  Patient Details  Name: Glenn Olson MRN: [id number] Date of Birth: 28-Sep-1940  Date of Discharge from OT service:Jun 27, 2022  Patient has met 6 of 7 long term goals due to improved activity tolerance, improved balance, functional use of  RIGHT lower and LEFT lower extremity, improved awareness, and improved coordination.  Patient to discharge at overall Modified Independent to intermittent Supervision level. Patient's care partner is independent to provide the necessary physical assistance at discharge. Pt is to discharge to his son's home, however pt's son did not attend family education training prior to D/C.  Reasons goals not met: Shower goal at mod I level not met due to supervision needed for standing on wet surface during peri-area cleaning  Recommendation:  Patient will benefit from ongoing skilled OT services in home health setting to continue to advance functional skills in the area of BADL, iADL, and Reduce care partner burden.  Equipment: No equipment provided  Reasons for discharge: treatment goals met  Patient/family agrees with progress made and goals achieved: Yes  OT Discharge Precautions/Restrictions  Precautions Precautions: Fall Precaution Comments: keep HR <135bpm Restrictions Weight Bearing Restrictions: No ADL ADL Eating: Independent Where Assessed-Eating: Wheelchair Grooming: Modified independent Where Assessed-Grooming: Sitting at sink Upper Body Bathing: Modified independent Where Assessed-Upper Body Bathing: Shower Lower Body Bathing: Supervision/safety Where Assessed-Lower Body Bathing: Shower Upper Body Dressing: Independent Where Assessed-Upper Body Dressing: Edge of bed Lower Body Dressing: Modified independent Where Assessed-Lower Body Dressing: Edge of bed Toileting: Modified independent Where Assessed-Toileting: Neurosurgeon Method:  Proofreader: Other (comment), Raised toilet seat, Grab bars (rollator) Tub/Shower Transfer: Not assessed Tub/Shower Transfer Method: Ship broker: Emergency planning/management officer, Grab bars, Walk in Music therapist: Close supervision Film/video editor Method: Designer, industrial/product: Information systems manager with back Vision Baseline Vision/History: 1 Wears glasses Patient Visual Report: No change from baseline Vision Assessment?: No apparent visual deficits Perception  Perception: Within Functional Limits Praxis Praxis: Intact Cognition Cognition Overall Cognitive Status: Within Functional Limits for tasks assessed Arousal/Alertness: Lethargic Orientation Level: Person;Situation Person: Oriented Place: Disoriented ("[place]" but knows hosptial) Situation: Oriented Memory: Impaired Awareness: Impaired Problem Solving: Appears intact Safety/Judgment: Appears intact Comments: slight decreased insight into deficits Brief Interview for Mental Status (BIMS) Repetition of Three Words (First Attempt): 3 Temporal Orientation: Year: Correct Temporal Orientation: Month: Accurate within 5 days Temporal Orientation: Day: Correct Recall: "Sock": Yes, no cue required Recall: "Blue": Yes, no cue required Recall: "Bed": Yes, no cue required BIMS Summary Score: 15 Sensation Sensation Light Touch: Appears Intact Hot/Cold: Appears Intact Proprioception: Appears Intact Stereognosis: Appears Intact Coordination Gross Motor Movements are Fluid and Coordinated: Yes Fine Motor Movements are Fluid and Coordinated: Yes Finger Nose Finger Test: WFL bilaterally Heel Shin Test: [facility] bilaterally Motor  Motor Motor: Within Functional Limits Motor - Skilled Clinical Observations: generalized weakness/deconditioning Mobility  Bed Mobility Bed Mobility: Rolling Right;Rolling Left;Sit to Supine;Supine to Sit Rolling Right: Independent with  assistive device Rolling Left: Independent with assistive device Supine to Sit: Independent with assistive device Sit to Supine: Independent with assistive device Transfers Sit to Stand: Independent with assistive device Stand to Sit: Independent with assistive device  Trunk/Postural Assessment  Cervical Assessment Cervical Assessment: Exceptions to [facility] (forward head) Thoracic Assessment Thoracic Assessment: Exceptions to [facility] (thoracic rounding) Lumbar Assessment Lumbar Assessment: Exceptions to [facility] (posterior pelvic tilt) Postural Control Postural Control: Within Functional Limits  Balance Balance Balance Assessed: Yes Static Sitting Balance  Static Sitting - Balance Support: No upper extremity supported;Feet supported Static Sitting - Level of Assistance: 7: Independent Dynamic Sitting Balance Dynamic Sitting - Balance Support: No upper extremity supported;Feet supported;During functional activity Dynamic Sitting - Level of Assistance: 6: Modified independent (Device/Increase time) Static Standing Balance Static Standing - Balance Support: During functional activity;Bilateral upper extremity supported (RW/rollator) Static Standing - Level of Assistance: 6: Modified independent (Device/Increase time) Dynamic Standing Balance Dynamic Standing - Balance Support: During functional activity;Bilateral upper extremity supported (RW/rollator) Dynamic Standing - Level of Assistance: 6: Modified independent (Device/Increase time) Dynamic Standing - Comments: with transfers and gait Extremity/Trunk Assessment RUE Assessment RUE Assessment: Within Functional Limits LUE Assessment LUE Assessment: Within Functional Limits   Glenn Olson Glenn Abagael Kramm, MS, OTR/L  06/27/2022, 12:22 PM

## 2022-06-27 NOTE — Progress Notes (Signed)
Inpatient Rehabilitation Discharge Medication Review by a Pharmacist  A complete drug regimen review was completed for this patient to identify any potential clinically significant medication issues.  High Risk Drug Classes Is patient taking? 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    Other       Clinically significant medication issues were identified that warrant physician communication and completion of prescribed/recommended actions by midnight of the next day:  No    Time spent performing this drug regimen review (minutes):  30   Eligah Anello BS, PharmD, BCPS Clinical Pharmacist 06/27/2022 7:26 AM  Contact: [phone] after 3 PM  "Be curious, not judgmental..." -Debbora Dus

## 2022-06-27 NOTE — Progress Notes (Signed)
Received patient in bed to unit.  Alert and oriented.  Informed consent signed and in chart.   TX duration:3.5   Patient tolerated well.  Transported back to the room  Alert, without acute distress.  Hand-off given to patient's nurse.   Access used: left AVG Access issues: NONE  Total UF removed: 2L Medication(s) given: NONE  Post HD weight: 71KG   06/27/22 1601  Vitals  Temp 97.6 F (36.4 C)  Temp Source Oral  BP (!) 129/90  MAP (mmHg) 102  BP Location Right Arm  BP Method Automatic  Patient Position (if appropriate) Lying  Pulse Rate Source Monitor  ECG Heart Rate (!) 122  Resp 15  Oxygen Therapy  SpO2 99 %  O2 Device Room Air  During Treatment Monitoring  HD Safety Checks Performed Yes  Intra-Hemodialysis Comments Tx completed  Dialysis Fluid Bolus Normal Saline  Bolus Amount (mL) 300 mL      Aubrianne Molyneux [facility]

## 2022-06-27 NOTE — Progress Notes (Signed)
Physical Therapy Discharge Summary  Patient Details  Name: Glenn Olson MRN: [id number] Date of Birth: January 01, 1941  Date of Discharge from PT service:Jun 27, 2022  Patient has met 8 of 8 long term goals due to improved activity tolerance, improved balance, improved postural control, increased strength, ability to compensate for deficits, and improved coordination. Patient to discharge at an ambulatory level Modified Independent using RW. Pt's son did not attend family education training prior to D/C.   All goals met   Recommendation:  Patient will benefit from ongoing skilled PT services in home health setting to continue to advance safe functional mobility, address ongoing impairments in transfers, generalized strengthening and endurance, dynamic standing balance/coordination, gait training, stair navigation, and to minimize fall risk.  Equipment: rollator  Reasons for discharge: treatment goals met and discharge from hospital  Patient/family agrees with progress made and goals achieved: Yes  PT Discharge Precautions/Restrictions Precautions Precautions: Fall Precaution Comments: keep HR <135bpm Restrictions Weight Bearing Restrictions: No Pain Interference Pain Interference Pain Effect on Sleep: 0. Does not apply - I have not had any pain or hurting in the past 5 days Pain Interference with Therapy Activities: 0. Does not apply - I have not received rehabilitationtherapy in the past 5 days Pain Interference with Day-to-Day Activities: 1. Rarely or not at all Cognition Overall Cognitive Status: Within Functional Limits for tasks assessed Arousal/Alertness: Awake/alert Orientation Level: Oriented X4 Memory: Impaired Awareness: Impaired Problem Solving: Appears intact Safety/Judgment: Appears intact Comments: slight decreased insight into deficits Sensation Sensation Light Touch: Appears Intact Hot/Cold: Appears Intact Proprioception: Appears Intact Stereognosis:  Appears Intact Coordination Gross Motor Movements are Fluid and Coordinated: Yes Fine Motor Movements are Fluid and Coordinated: Yes Finger Nose Finger Test: WFL bilaterally Heel Shin Test: [facility] bilaterally Motor  Motor Motor: Within Functional Limits Motor - Skilled Clinical Observations: generalized weakness/deconditioning  Mobility Bed Mobility Bed Mobility: Rolling Right;Rolling Left;Sit to Supine;Supine to Sit Rolling Right: Independent with assistive device Rolling Left: Independent with assistive device Supine to Sit: Independent with assistive device Sit to Supine: Independent with assistive device Transfers Transfers: Sit to Stand;Stand to Sit;Stand Pivot Transfers Sit to Stand: Independent with assistive device Stand to Sit: Independent with assistive device Stand Pivot Transfers: Independent with assistive device Transfer (Assistive device): Rolling walker Locomotion  Gait Ambulation: Yes Gait Assistance: Independent with assistive device Gait Distance (Feet): 150 Feet Assistive device: Rolling walker Gait Gait: Yes Gait Pattern: Impaired Gait Pattern: Step-to pattern;Decreased step length - right;Decreased step length - left;Decreased stride length;Shuffle;Trunk flexed;Poor foot clearance - left;Poor foot clearance - right;Narrow base of support;Step-through pattern;Antalgic Gait velocity: decreased Stairs / Additional Locomotion Stairs: Yes Stairs Assistance: Supervision/Verbal cueing Stair Management Technique: With walker Number of Stairs: 8 Height of Stairs: 3 Ramp: Independent with assistive device (RW) Pick up small object from the floor assist level: Supervision/Verbal cueing Pick up small object from the floor assistive device: tissue box with reacher without AD Wheelchair Mobility Wheelchair Mobility: No  Trunk/Postural Assessment  Cervical Assessment Cervical Assessment: Exceptions to [facility] (forward head) Thoracic Assessment Thoracic Assessment:  Exceptions to [facility] (thoracic rounding) Lumbar Assessment Lumbar Assessment: Exceptions to [facility] (posterior pelvic tilt) Postural Control Postural Control: Within Functional Limits  Balance Balance Balance Assessed: Yes Static Sitting Balance Static Sitting - Balance Support: No upper extremity supported;Feet supported Static Sitting - Level of Assistance: 7: Independent Dynamic Sitting Balance Dynamic Sitting - Balance Support: No upper extremity supported;Feet supported;During functional activity Dynamic Sitting - Level of Assistance: 6: Modified independent (Device/Increase time) Static  Standing Balance Static Standing - Balance Support: During functional activity;Bilateral upper extremity supported (RW/rollator) Static Standing - Level of Assistance: 6: Modified independent (Device/Increase time) Dynamic Standing Balance Dynamic Standing - Balance Support: During functional activity;Bilateral upper extremity supported (RW/rollator) Dynamic Standing - Level of Assistance: 6: Modified independent (Device/Increase time) Dynamic Standing - Comments: with transfers and gait Extremity Assessment  RLE Assessment RLE Assessment: Exceptions to [facility] General Strength Comments: tested sitting EOM - grossly 4+/5 LLE Assessment LLE Assessment: Exceptions to [facility] General Strength Comments: tested sitting EOM - grossly 4+/5   Marlana Salvage Zaunegger Blima Rich PT, DPT 06/27/2022, 7:05 AM

## 2022-06-27 NOTE — Progress Notes (Signed)
[facility] NEPHROLOGY PROGRESS NOTE  Assessment/ Plan: Pt is a 82 y.o. yo male ESRD on HD now in CIR for rehab.  # ESRD: It looks like he was off of dialysis for some time, looked into PD but not an option because of sedation. Now he is getting dialysis MWF schedule.  Outpatient HD arranged at [facility] TTS 6:10 AM. Status post HD on 5/27 with 1.3 L ultrafiltration.  Plan on dialysis today -  then  to change to TTS schedule. He tells me that he is also slated to go home on Thursday.  Have talked to renal navigator-  will plan for HD here today so he can go home first thing on Thurs-  then will need to sign papers at the kidney center on Friday in order to start there on Saturday -  seems like all of that is arranged  # Anemia of CKD: Received IV iron and now on ESA.    # Secondary hyperparathyroidism/hyperphosphatemia: On PhosLo, calcitriol-  to continue  # HTN/volume: Blood pressure and volume status acceptable. On cardizem only  Subjective: Seen in room.  Planning for dialysis later today -  to be discharged tomorrow ,then go to OP unit on Saturday  Objective Vital signs in last 24 hours: Vitals:   06/27/22 1216 06/27/22 1224 06/27/22 1245 06/27/22 1300  BP:  115/67 124/76 110/81  Pulse:  88 76 93  Resp:  16 19 19   Temp:      TempSrc:      SpO2:  99% 98% 99%  Weight: 73.2 kg     Height:       Weight change: -1.2 kg  Intake/Output Summary (Last 24 hours) at 06/27/2022 1313 Last data filed at 06/27/2022 0755 Gross per 24 hour  Intake 598 ml  Output 1050 ml  Net -452 ml       Labs: RENAL PANEL Recent Labs  Lab 06/25/22 1405 06/27/22 1220  NA 137 134*  K 3.7 3.9  CL 98 95*  CO2 21* 20*  GLUCOSE 145* 98  BUN 97* 89*  CREATININE 7.41* 7.08*  CALCIUM 8.3* 9.1  PHOS 6.9* 6.1*  ALBUMIN 3.2* 3.4*    Liver Function Tests: Recent Labs  Lab 06/25/22 1405 06/27/22 1220  ALBUMIN 3.2* 3.4*   No results for input(s): "LIPASE", "AMYLASE" in the last 168  hours. No results for input(s): "AMMONIA" in the last 168 hours. CBC: Recent Labs    06/01/22 0759 06/02/22 0411 06/15/22 0202 06/19/22 0843 06/20/22 0753 06/25/22 1405 06/27/22 1220  HGB 6.7*   < > 8.0* 8.5* 7.6* 8.4* 8.6*  MCV 87.3   < > 95.6  --  96.8 94.3 94.5  TIBC 197*  --   --   --   --   --   --   IRON 31*  --   --   --   --   --   --    < > = values in this interval not displayed.    Cardiac Enzymes: No results for input(s): "CKTOTAL", "CKMB", "CKMBINDEX", "TROPONINI" in the last 168 hours. CBG: Recent Labs  Lab 06/23/22 1204 06/23/22 2124 06/24/22 0620 06/24/22 2137 06/25/22 1201  GLUCAP 91 113* 80 139* 81    Iron Studies: No results for input(s): "IRON", "TIBC", "TRANSFERRIN", "FERRITIN" in the last 72 hours. Studies/Results: No results found.  Medications: Infusions:   Scheduled Medications:  apixaban  2.5 mg Oral BID   calcitRIOL  0.5 mcg Oral Daily  calcium acetate  667 mg Oral TID WC   carbamide peroxide  5 drop Both EARS TID   Chlorhexidine Gluconate Cloth  6 each Topical Q0600   Chlorhexidine Gluconate Cloth  6 each Topical Q0600   Chlorhexidine Gluconate Cloth  6 each Topical Q0600   cholecalciferol  1,000 Units Oral Daily   [START ON 06/29/2022] darbepoetin (ARANESP) injection - DIALYSIS  200 mcg Subcutaneous Q Fri-1800   diltiazem  300 mg Oral Daily   feeding supplement (NEPRO CARB STEADY)  237 mL Oral BID BM   ferrous sulfate  325 mg Oral Q breakfast   loratadine  10 mg Oral Daily    have reviewed scheduled and prn medications.  Physical Exam: General:NAD, comfortable Heart:RRR, s1s2 nl Lungs:clear b/l, no crackle Abdomen:soft, Non-tender, non-distended Extremities:No edema Dialysis Access: AVG has good thrill and bruit.  Jackie Littlejohn A Jaz Mallick 06/27/2022,1:13 PM  LOS: 7 days

## 2022-06-27 NOTE — Progress Notes (Signed)
PROGRESS NOTE   Subjective/Complaints: No new complaints today Excited for d/c tomorrow PT notes fatigued when HR is elevated, patient denies other symptoms  ROS: denies pain, SOB, CP, HA, Insomnia. Denies N/V, +fatigue   Objective:   No results found. Recent Labs    06/25/22 1405 06/27/22 1220  WBC 6.9 6.5  HGB 8.4* 8.6*  HCT 26.7* 27.3*  PLT 250 240    Recent Labs    06/25/22 1405 06/27/22 1220  NA 137 134*  K 3.7 3.9  CL 98 95*  CO2 21* 20*  GLUCOSE 145* 98  BUN 97* 89*  CREATININE 7.41* 7.08*  CALCIUM 8.3* 9.1     Intake/Output Summary (Last 24 hours) at 06/27/2022 2121 Last data filed at 06/27/2022 1845 Gross per 24 hour  Intake 840 ml  Output 3475 ml  Net -2635 ml        Physical Exam: Vital Signs Blood pressure (!) 102/58, pulse (!) 110, temperature 98 F (36.7 C), temperature source Oral, resp. rate 16, height 5\' 8"  (1.727 m), weight 71 kg, SpO2 97 %.  General: No acute distress, BMI 24.37 Mood and affect are appropriate Heart: Tachycardic Lungs: Clear to auscultation, breathing unlabored, no rales or wheezes Abdomen: Positive bowel sounds, soft nontender to palpation, nondistended Extremities: No clubbing, cyanosis, or edema Skin: AVG with thrill  Neurological:     Mental Status: He is alert and oriented to person, place, Month and day but not date . Decreased short term memory. No focal deficits MMT - 4/5 in BUE and BLE   Assessment/Plan: 1. Functional deficits which require 3+ hours per day of interdisciplinary therapy in a comprehensive inpatient rehab setting. Physiatrist is providing close team supervision and 24 hour management of active medical problems listed below. Physiatrist and rehab team continue to assess barriers to discharge/monitor patient progress toward functional and medical goals  Care Tool:  Bathing    Body parts bathed by patient: Right arm, Left arm,  Chest, Abdomen, Front perineal area, Buttocks, Right upper leg, Left upper leg, Right lower leg, Left lower leg, Face         Bathing assist Assist Level: Supervision/Verbal cueing     Upper Body Dressing/Undressing Upper body dressing   What is the patient wearing?: Pull over shirt    Upper body assist Assist Level: Independent    Lower Body Dressing/Undressing Lower body dressing      What is the patient wearing?: Pants, Incontinence brief     Lower body assist Assist for lower body dressing: Independent with assitive device     Toileting Toileting    Toileting assist Assist for toileting: Independent with assistive device     Transfers Chair/bed transfer  Transfers assist     Chair/bed transfer assist level: Independent with assistive device Chair/bed transfer assistive device: Geologist, engineering   Ambulation assist      Assist level: Independent with assistive device Assistive device: Walker-rolling Max distance: 122ft   Walk 10 feet activity   Assist     Assist level: Independent with assistive device Assistive device: Walker-rolling   Walk 50 feet activity   Assist    Assist level: Independent  with assistive device Assistive device: Walker-rolling    Walk 150 feet activity   Assist Walk 150 feet activity did not occur: Safety/medical concerns (fatigue, decreased balance/coordination)  Assist level: Independent with assistive device Assistive device: Walker-rolling    Walk 10 feet on uneven surface  activity   Assist     Assist level: Independent with assistive device Assistive device: Walker-rolling   Wheelchair     Assist Is the patient using a wheelchair?: No Type of Wheelchair: Manual Wheelchair activity did not occur: N/A  Wheelchair assist level: Supervision/Verbal cueing Max wheelchair distance: 190ft    Wheelchair 50 feet with 2 turns activity    Assist    Wheelchair 50 feet with 2  turns activity did not occur: N/A   Assist Level: Supervision/Verbal cueing   Wheelchair 150 feet activity     Assist  Wheelchair 150 feet activity did not occur: N/A   Assist Level: Supervision/Verbal cueing   Blood pressure (!) 102/58, pulse (!) 110, temperature 98 F (36.7 C), temperature source Oral, resp. rate 16, height 5\' 8"  (1.727 m), weight 71 kg, SpO2 97 %.  Medical Problem List and Plan: 1. 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             -patient may shower             -ELOS/Goals: 5-7 days             Order grounds pass  -Continue CIR  -Estimated discharge 5/30  -Ambulated 180 ft x2 with PT  2.  Antithrombotics: -DVT/anticoagulation:  Pharmaceutical: Eliquis             -antiplatelet therapy: N/A  3. Pain Management:  Tylenol prn  4. Mood/Behavior/Sleep: LCSW to follow for evaluation and support.              --insomnia-->no issues with sleeping. Will add melatonin prn.              -antipsychotic agents: N/A  5. Neuropsych/cognition: This patient is capable of making decisions on his own behalf. 6. Skin/Wound Care: Routine pressure relief measures.    7. Fluids/Electrolytes/Nutrition: Strict I/O. 1200 cc FR/day             --renal diet   8. ESRD: HD at the end of the day to help with tolerance of therapy on MWF             --strict I/O. Daily weights. Continue Renal diet w/1200 cc FR.    9. A fib with RVR: Monitor HR TID--continue Cardizem for rate control. Magnesium reviewed and was previously low.  -- Continue Eliquis for CVA prophylaxis   10. Neurogenic bladder s/p SPC: Does not remember why or who?  --SPC has not been changed for years per patient.   11. Anemia of chronic disease: CBC with HD session and aranesp every Friday for erythropoiesis. --transfusion prn symptoms. Hgb stable in 7-8 range.     12. Code status: DNR w/full scope of treatment. --per Palliative care w/option on comfort measures if he feels  that he wants to stop HD.    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    14. Vitamin D deficiency: continue 1000u vit D daily, Pt has been started on calcitriol 0.5 mcg by nephrology, this should provide activated vit D also, discussed with pharmacy- appreciate assistance    15. Iron deficiency anemia: continue iron supplement  16. Fatigue: continue vitamin D and iron supplements  17. Hypotension: discussed that we will not increase cardizem further despite tachycardia    LOS: 7 days A FACE TO FACE EVALUATION WAS PERFORMED  Glenn Olson 06/27/2022, 9:21 PM

## 2022-06-27 NOTE — Progress Notes (Addendum)
   Rollator ordered through [employer].  HH referral sent to [facility].

## 2022-06-28 ENCOUNTER — Other Ambulatory Visit (HOSPITAL_COMMUNITY): Payer: Self-pay

## 2022-06-28 MED ORDER — CALCIUM ACETATE (PHOS BINDER) 667 MG PO CAPS
667.0000 mg | ORAL_CAPSULE | Freq: Three times a day (TID) | ORAL | 0 refills | Status: DC
  Filled 2022-06-28: qty 90, 30d supply, fill #0

## 2022-06-28 MED ORDER — VITAMIN D3 25 MCG PO TABS: 1000.0000 [IU] | ORAL_TABLET | Freq: Every day | ORAL | 0 refills | Status: DC

## 2022-06-28 MED ORDER — CALCIUM ACETATE (PHOS BINDER) 667 MG PO CAPS: 667.0000 mg | ORAL_CAPSULE | Freq: Three times a day (TID) | ORAL | 0 refills | Status: DC

## 2022-06-28 MED ORDER — MELATONIN 3 MG PO TABS: 3.0000 mg | ORAL_TABLET | Freq: Every evening | ORAL | 0 refills | Status: AC | PRN

## 2022-06-28 MED ORDER — DILTIAZEM HCL ER COATED BEADS 300 MG PO CP24: 300.0000 mg | ORAL_CAPSULE | Freq: Every day | ORAL | 0 refills | Status: DC

## 2022-06-28 MED ORDER — APIXABAN 2.5 MG PO TABS: 2.5000 mg | ORAL_TABLET | Freq: Two times a day (BID) | ORAL | 0 refills | Status: DC

## 2022-06-28 MED ORDER — APIXABAN 2.5 MG PO TABS
2.5000 mg | ORAL_TABLET | Freq: Two times a day (BID) | ORAL | 0 refills | Status: DC
  Filled 2022-06-28: qty 60, 30d supply, fill #0

## 2022-06-28 MED ORDER — LORATADINE 10 MG PO TABS
10.0000 mg | ORAL_TABLET | Freq: Every day | ORAL | 0 refills | Status: DC
  Filled 2022-06-28: qty 30, 30d supply, fill #0

## 2022-06-28 MED ORDER — DILTIAZEM HCL ER COATED BEADS 300 MG PO CP24
300.0000 mg | ORAL_CAPSULE | Freq: Every day | ORAL | 0 refills | Status: DC
  Filled 2022-06-28: qty 30, 30d supply, fill #0

## 2022-06-28 MED ORDER — MELATONIN 3 MG PO TABS
3.0000 mg | ORAL_TABLET | Freq: Every evening | ORAL | 0 refills | Status: DC | PRN
  Filled 2022-06-28: qty 30, 30d supply, fill #0

## 2022-06-28 MED ORDER — LORATADINE 10 MG PO TABS: 10.0000 mg | ORAL_TABLET | Freq: Every day | ORAL | 0 refills | Status: AC

## 2022-06-28 MED ORDER — CALCITRIOL 0.5 MCG PO CAPS: 0.5000 ug | ORAL_CAPSULE | Freq: Every day | ORAL | 0 refills | Status: DC

## 2022-06-28 MED ORDER — VITAMIN D3 25 MCG PO TABS
1000.0000 [IU] | ORAL_TABLET | Freq: Every day | ORAL | 0 refills | Status: DC
  Filled 2022-06-28: qty 30, 30d supply, fill #0

## 2022-06-28 MED ORDER — CALCITRIOL 0.5 MCG PO CAPS
0.5000 ug | ORAL_CAPSULE | Freq: Every day | ORAL | 0 refills | Status: DC
  Filled 2022-06-28: qty 30, 30d supply, fill #0

## 2022-06-28 NOTE — Patient Care Conference (Signed)
Inpatient RehabilitationTeam Conference and Plan of Care Update Date: 06/28/2022   Time: 11:36 AM    Patient Name: Glenn Olson      Medical Record Number: [id number]  Date of Birth: Jun 02, 1940 Sex: Male         Room/Bed: 4W21C/4W21C-01 Payor Info: Payor: MEDICARE / Plan: MEDICARE PART A AND B / Product Type: *No Product type* /    Admit Date/Time:  06/20/2022  2:37 PM  Primary Diagnosis:  Debility  Hospital Problems: Principal Problem:   Debility Active Problems:   Atonic neurogenic bladder   ESRD on hemodialysis (HCC)   Chronic indwelling Foley catheter   Malnutrition of moderate degree    Expected Discharge Date: Expected Discharge Date: 06/28/22  Team Members Present: Physician leading conference: Dr. Sula Soda Social Worker Present: Lavera Guise, BSW Nurse Present: Chana Bode, RN;Cathren Sween Marjo Bicker, RN PT Present: Raechel Chute, PT OT Present: Candee Furbish, OT PPS Coordinator present : Fae Pippin, SLP     Current Status/Progress Goal Weekly Team Focus  Bowel/Bladder   chronic suprapubic cath, continent to bowel LBM 5/28   pt ro remain continent of bowel, pt to remain free from urinary infection   BID CHG baths, assess B/B daily and PRN    Swallow/Nutrition/ Hydration               ADL's   Set up A UB, Supervision/min A for LB for catheter cord management, Supervision toileting   Mod I; supervision for shower transfers   ADL retraining, balance, endurance, DC planning    Mobility   bed moiblity supervision with HOB elevated, transfers with rollator close supervision, gait >136ft with rollator and supervision, 16 3in steps with bilateral handrail and supervision but min HHA without using rails   Mod I overall  functional mobility/transfers, generalized strengthening and endurance, dynamic standing balance/coordination, DME, D/C planning, gait training, and stair navigation.    Communication                Safety/Cognition/ Behavioral  Observations               Pain   pt denies pain   pt to reamin pain free or less than 3/10 goal   Assess every shift and PRn    Skin   skin intact, suprapubic cath   no S/s of infection or draing at insertion site  assess every shift and PRN      Discharge Planning:  Pt lives with son and daughter in law, anticpating returning.  1 level home, level entry. If son and DIL unable to provide 24/7 supervision, anticpate hiring assistance.   Team Discussion: Debility. Continent of bowel with preexisting suprapubic catheter. Changed out upon admission. Denies pain. Skin remains intact. Daily weight- HD- M/W/F. Renal diet with 1200 fluid restriction. HR increased in 120's-130's at rest. B/P has been running soft.  Patient on target to meet rehab goals: Most goals met.  Bathing was the exception; goal not met  *See Care Plan and progress notes for long and short-term goals.   Revisions to Treatment Plan:  Will continue on Cardizem for heart rate control  Teaching Needs: Medications, safety, gait/transfer training, self care, skin care, etc.   Current Barriers to Discharge: Decreased caregiver support and Hemodialysis  Possible Resolutions to Barriers: Hired caregivers in place, order any recommended DME if needed.      Medical Summary Current Status: urinary retention, tachycardia  Barriers to Discharge: Medical stability  Barriers to Discharge Comments: urinary retention, tachycardia  Possible Resolutions to [employer]: continue suprapubic catheter, continue to monitor HR TID, continue Cardizem, discussed not increasing dose given hypotension   Continued Need for Acute Rehabilitation Level of Care: The patient requires daily medical management by a physician with specialized training in physical medicine and rehabilitation for the following reasons: Direction of a multidisciplinary physical rehabilitation program to maximize functional independence : Yes Medical  management of patient stability for increased activity during participation in an intensive rehabilitation regime.: Yes Analysis of laboratory values and/or radiology reports with any subsequent need for medication adjustment and/or medical intervention. : Yes   I attest that I was present, lead the team conference, and concur with the assessment and plan of the team.   Jearld Adjutant 06/28/2022, 7:45 AM

## 2022-06-28 NOTE — Plan of Care (Signed)
[facility]  Initial Hemodialysis Orders  Dialysis center: [facility]. Patient hasn't left yet. Discharge summary to follow  Patient's name: Glenn Olson DOB: 13-Jul-1940  AKI or ESRD: ESRD. Patient needing to resume HD. Apparently, patient quit going to HD 14 months ago because he didn't like going.  History: ESRD, neurogenic bladder with chronic Foley   Discharge diagnosis: ESRD/uremia - It looks like he was off of dialysis for some time, looked into PD but not an option because of sedation.  2. Neurogenic bladder- has chronic foley  Allergies: No Known Allergies  Date of First Dialysis: 05/31/22  Dialysis Prescription: Dialysis Frequency: TTS Tx duration: 4hrs BFR: 400 DFR: 600 EDW: 71.5kg. Not convinced weights were accurate here. Notify renal team for any weight discrepancies.  Dialyzer: 180NRe UF profile/Sodium modeling?: None Dialysis Bath: 2 K/2 Ca  Dialysis access: Access type: R IJ TDC removed 5/22 in IR. Patient with a working L AVG. Appears it was placed August 2022 by Dr. Arbie Cookey. Use 15g needles  In Center Medications: Heparin Dose: 2000 units  Type: Bolus VDRA: Calcitriol  0.31mcg q HD Venofer: Per protocol Mircera: every 2 weeks. Next dose due: 06/29/22. Last received Aranesp on 5/24 Continue Calcium acetate 667mg  (1 tablet) with meals. Please add regimen on med list and send Rx!  Discharge labs: Hgb: 8.6 K+: 3.9  Ca: 9.1  Phos: 6.1 Alb: 3.4  Please draw routine labs. Additional labs needed: Perform routine labs per facility protocol.

## 2022-06-28 NOTE — Progress Notes (Signed)
PROGRESS NOTE   Subjective/Complaints: No new complaints this morning  Sleepy Discharging home with son today BP and HR are excellent  ROS: denies pain, SOB, CP, HA, Insomnia. Denies N/V, +fatigue   Objective:   No results found. Recent Labs    06/25/22 1405 06/27/22 1220  WBC 6.9 6.5  HGB 8.4* 8.6*  HCT 26.7* 27.3*  PLT 250 240    Recent Labs    06/25/22 1405 06/27/22 1220  NA 137 134*  K 3.7 3.9  CL 98 95*  CO2 21* 20*  GLUCOSE 145* 98  BUN 97* 89*  CREATININE 7.41* 7.08*  CALCIUM 8.3* 9.1     Intake/Output Summary (Last 24 hours) at 06/28/2022 0937 Last data filed at 06/28/2022 1610 Gross per 24 hour  Intake 1076 ml  Output 2425 ml  Net -1349 ml        Physical Exam: Vital Signs Blood pressure 109/79, pulse (!) 110, temperature 97.8 F (36.6 C), temperature source Oral, resp. rate 17, height 5\' 8"  (1.727 m), weight 70.2 kg, SpO2 100 %.  General: No acute distress, BMI 24.37 Mood and affect are appropriate Heart: HR normalized Lungs: Clear to auscultation, breathing unlabored, no rales or wheezes Abdomen: Positive bowel sounds, soft nontender to palpation, nondistended Extremities: No clubbing, cyanosis, or edema Skin: AVG with thrill  Neurological:     Mental Status: He is alert and oriented to person, place, Month and day but not date . Decreased short term memory. No focal deficits MMT - 4/5 in BUE and BLE   Assessment/Plan: 1. Functional deficits which require 3+ hours per day of interdisciplinary therapy in a comprehensive inpatient rehab setting. Physiatrist is providing close team supervision and 24 hour management of active medical problems listed below. Physiatrist and rehab team continue to assess barriers to discharge/monitor patient progress toward functional and medical goals  Care Tool:  Bathing    Body parts bathed by patient: Right arm, Left arm, Chest, Abdomen, Front  perineal area, Buttocks, Right upper leg, Left upper leg, Right lower leg, Left lower leg, Face         Bathing assist Assist Level: Supervision/Verbal cueing     Upper Body Dressing/Undressing Upper body dressing   What is the patient wearing?: Pull over shirt    Upper body assist Assist Level: Independent    Lower Body Dressing/Undressing Lower body dressing      What is the patient wearing?: Pants, Incontinence brief     Lower body assist Assist for lower body dressing: Independent with assitive device     Toileting Toileting    Toileting assist Assist for toileting: Independent with assistive device     Transfers Chair/bed transfer  Transfers assist     Chair/bed transfer assist level: Independent with assistive device Chair/bed transfer assistive device: Geologist, engineering   Ambulation assist      Assist level: Independent with assistive device Assistive device: Walker-rolling Max distance: 114ft   Walk 10 feet activity   Assist     Assist level: Independent with assistive device Assistive device: Walker-rolling   Walk 50 feet activity   Assist    Assist level: Independent with assistive  device Assistive device: Walker-rolling    Walk 150 feet activity   Assist Walk 150 feet activity did not occur: Safety/medical concerns (fatigue, decreased balance/coordination)  Assist level: Independent with assistive device Assistive device: Walker-rolling    Walk 10 feet on uneven surface  activity   Assist     Assist level: Independent with assistive device Assistive device: Walker-rolling   Wheelchair     Assist Is the patient using a wheelchair?: No Type of Wheelchair: Manual Wheelchair activity did not occur: N/A  Wheelchair assist level: Supervision/Verbal cueing Max wheelchair distance: 1102ft    Wheelchair 50 feet with 2 turns activity    Assist    Wheelchair 50 feet with 2 turns activity did not  occur: N/A   Assist Level: Supervision/Verbal cueing   Wheelchair 150 feet activity     Assist  Wheelchair 150 feet activity did not occur: N/A   Assist Level: Supervision/Verbal cueing   Blood pressure 109/79, pulse (!) 110, temperature 97.8 F (36.6 C), temperature source Oral, resp. rate 17, height 5\' 8"  (1.727 m), weight 70.2 kg, SpO2 100 %.  Medical Problem List and Plan: 1. 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             -patient may shower             -ELOS/Goals: 8 days             Order grounds pass  -d/c home  -Estimated discharge 5/30  -Ambulated 180 ft x2 with PT  2.  Antithrombotics: -DVT/anticoagulation:  Pharmaceutical: Eliquis             -antiplatelet therapy: N/A  3. Pain Management:  Tylenol prn  4. Mood/Behavior/Sleep: LCSW to follow for evaluation and support.              --insomnia-->no issues with sleeping. Will add melatonin prn.              -antipsychotic agents: N/A  5. Neuropsych/cognition: This patient is capable of making decisions on his own behalf. 6. Skin/Wound Care: Routine pressure relief measures.    7. Fluids/Electrolytes/Nutrition: Strict I/O. 1200 cc FR/day             --renal diet   8. ESRD: HD at the end of the day to help with tolerance of therapy on MWF             --strict I/O. Daily weights. Continue Renal diet w/1200 cc FR.    9. A fib with RVR: Monitor HR TID--continue Cardizem for rate control. Magnesium reviewed and was previously low.  -- Continue Eliquis for CVA prophylaxis   10. Neurogenic bladder s/p SPC: Does not remember why or who?  --SPC has not been changed for years per patient.   11. Anemia of chronic disease: CBC with HD session and aranesp every Friday for erythropoiesis. --transfusion prn symptoms. Hgb stable in 7-8 range.     12. Code status: DNR w/full scope of treatment. --per Palliative care w/option on comfort measures if he feels that he wants to stop HD.     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    14. Vitamin D deficiency: continue 1000u vit D daily, Pt has been started on calcitriol 0.5 mcg by nephrology, this should provide activated vit D also, discussed with pharmacy- appreciate assistance    15. Iron deficiency anemia: continue iron supplement  16. Fatigue: continue vitamin D and iron supplements  17. Hypotension: discussed  that we will not increase cardizem further despite tachycardia, continue to monitor with outpatient dialysis.    >30 minutes spent in discharge of patient including review of medications and follow-up appointments, physical examination, and in answering all patient's questions    LOS: 8 days A FACE TO FACE EVALUATION WAS PERFORMED  Drema Pry Danika Kluender 06/28/2022, 9:37 AM

## 2022-06-28 NOTE — Progress Notes (Signed)
Looks like plan is for d/c-  OP HD is arranged for Saturday 6/1 at [facility]-  orders will be sent if not sent already-  renal navigator has been in touch with family with the details of starting HD in the OP setting  Call with questions   Cecille Aver

## 2022-06-28 NOTE — Progress Notes (Signed)
Inpatient Rehabilitation Care Coordinator Discharge Note   Patient Details  Name: Glenn Olson MRN: [id number] Date of Birth: [birth year]   Discharge location: Home with son  Length of Stay: 8 days  Discharge activity level: sup  Home/community participation: son and SIL  Patient response WU:[id number] Literacy - How often do you need to have someone help you when you read instructions, pamphlets, or other written material from your doctor or pharmacy?: Often  Patient response YN:WGNFAO Isolation - How often do you feel lonely or isolated from those around you?: Never  Services provided included: MD, RD, PT, OT, SLP, RN, CM, TR, SW, Pharmacy  Financial Services:  Financial Services Utilized: Medicare    Choices offered to/list presented to: pt and son  Follow-up services arranged:  Home Health Home Health Agency: [facility]         Patient response to transportation need: Is the patient able to respond to transportation needs?: Yes In the past 12 months, has lack of transportation kept you from medical appointments or from getting medications?: No In the past 12 months, has lack of transportation kept you from meetings, work, or from getting things needed for daily living?: No   Patient/Family verbalized understanding of follow-up arrangements:  Yes  Individual responsible for coordination of the follow-up plan: bob [phone]  Confirmed correct DME delivered: Andria Rhein 06/28/2022    Comments (or additional information):  Summary of Stay    Date/Time Discharge Planning CSW  06/26/22 0956 Pt lives with son and daughter in law, anticpating returning.  1 level home, level entry. If son and DIL unable to provide 24/7 supervision, anticpate hiring assistance. CJB       Andria Rhein

## 2022-06-28 NOTE — Progress Notes (Signed)
Pt d/c to home today. Contacted [facility] to advise clinic of pt's d/c today and that pt will start on Saturday. Spoke to pt's son yesterday. Son aware that pt needs to complete paperwork at clinic either today or tomorrow in order to start on Saturday. Renal NP to send orders to clinic.   Olivia Canter Renal Navigator [phone]

## 2022-06-29 ENCOUNTER — Telehealth: Payer: Self-pay | Admitting: Physician Assistant

## 2022-06-29 NOTE — Telephone Encounter (Signed)
Transition of care contact from inpatient facility  Date of Discharge: 06/28/22 Date of Contact: 06/29/22 Method of contact: Phone  Attempted to contact patient to discuss transition of care from inpatient admission. Patient did not answer the phone. Unable to leave VM. We will plan to follow up with him at outpatient dialysis.  Rogers Blocker, PA-C 06/29/2022, 10:32 AM  [employer]

## 2022-06-30 DIAGNOSIS — Z23 Encounter for immunization: Secondary | ICD-10-CM | POA: Diagnosis not present

## 2022-06-30 DIAGNOSIS — Z992 Dependence on renal dialysis: Secondary | ICD-10-CM | POA: Diagnosis not present

## 2022-06-30 DIAGNOSIS — D631 Anemia in chronic kidney disease: Secondary | ICD-10-CM | POA: Diagnosis not present

## 2022-06-30 DIAGNOSIS — D509 Iron deficiency anemia, unspecified: Secondary | ICD-10-CM | POA: Diagnosis not present

## 2022-06-30 DIAGNOSIS — N186 End stage renal disease: Secondary | ICD-10-CM | POA: Diagnosis not present

## 2022-06-30 DIAGNOSIS — N2581 Secondary hyperparathyroidism of renal origin: Secondary | ICD-10-CM | POA: Diagnosis not present

## 2022-07-01 DIAGNOSIS — I129 Hypertensive chronic kidney disease with stage 1 through stage 4 chronic kidney disease, or unspecified chronic kidney disease: Secondary | ICD-10-CM | POA: Diagnosis not present

## 2022-07-01 DIAGNOSIS — N186 End stage renal disease: Secondary | ICD-10-CM | POA: Diagnosis not present

## 2022-07-01 DIAGNOSIS — Z992 Dependence on renal dialysis: Secondary | ICD-10-CM | POA: Diagnosis not present

## 2022-07-02 NOTE — Progress Notes (Signed)
Patient ID: Glenn Olson, male   DOB: 28-Mar-1940, 82 y.o.   MRN: [id number]  10:32 AM:  Sw informed pt and son declined HH services with [facility].

## 2022-07-03 DIAGNOSIS — Z23 Encounter for immunization: Secondary | ICD-10-CM | POA: Diagnosis not present

## 2022-07-03 DIAGNOSIS — D509 Iron deficiency anemia, unspecified: Secondary | ICD-10-CM | POA: Diagnosis not present

## 2022-07-03 DIAGNOSIS — D631 Anemia in chronic kidney disease: Secondary | ICD-10-CM | POA: Diagnosis not present

## 2022-07-03 DIAGNOSIS — N2581 Secondary hyperparathyroidism of renal origin: Secondary | ICD-10-CM | POA: Diagnosis not present

## 2022-07-03 DIAGNOSIS — N186 End stage renal disease: Secondary | ICD-10-CM | POA: Diagnosis not present

## 2022-07-03 DIAGNOSIS — Z992 Dependence on renal dialysis: Secondary | ICD-10-CM | POA: Diagnosis not present

## 2022-07-04 ENCOUNTER — Ambulatory Visit (HOSPITAL_COMMUNITY)
Admit: 2022-07-04 | Discharge: 2022-07-04 | Disposition: A | Discharge status: Home / Self Care | Payer: Medicare Other | Attending: Physician Assistant | Admitting: Physician Assistant

## 2022-07-04 ENCOUNTER — Encounter (HOSPITAL_COMMUNITY): Payer: Self-pay | Admitting: Physician Assistant

## 2022-07-04 VITALS — BP 118/64 | HR 85 | Ht 72.0 in | Wt 156.8 lb

## 2022-07-04 DIAGNOSIS — I4510 Unspecified right bundle-branch block: Secondary | ICD-10-CM | POA: Diagnosis not present

## 2022-07-04 DIAGNOSIS — Z992 Dependence on renal dialysis: Secondary | ICD-10-CM | POA: Insufficient documentation

## 2022-07-04 DIAGNOSIS — D6869 Other thrombophilia: Secondary | ICD-10-CM | POA: Insufficient documentation

## 2022-07-04 DIAGNOSIS — Z79899 Other long term (current) drug therapy: Secondary | ICD-10-CM | POA: Insufficient documentation

## 2022-07-04 DIAGNOSIS — I483 Typical atrial flutter: Secondary | ICD-10-CM | POA: Insufficient documentation

## 2022-07-04 DIAGNOSIS — I4819 Other persistent atrial fibrillation: Secondary | ICD-10-CM | POA: Insufficient documentation

## 2022-07-04 DIAGNOSIS — Z7901 Long term (current) use of anticoagulants: Secondary | ICD-10-CM | POA: Insufficient documentation

## 2022-07-04 DIAGNOSIS — N186 End stage renal disease: Secondary | ICD-10-CM | POA: Insufficient documentation

## 2022-07-04 MED ORDER — AMIODARONE HCL 200 MG PO TABS: ORAL_TABLET | ORAL | 2 refills | Status: DC

## 2022-07-04 NOTE — Patient Instructions (Signed)
Start Amiodarone 200mg  -- take 1 tablet twice a day with food until July 5th then reduce to once a day

## 2022-07-04 NOTE — Progress Notes (Signed)
Primary Care Physician: Benita Stabile, MD Primary Cardiologist: Dr Eldridge Dace  Primary Electrophysiologist: Dr Nelly Laurence Referring Physician: Dr Hartley Barefoot is a 82 y.o. male with a history of ESRD, anemia, atrial flutter, atrial fibrillation who presents for consultation in the [facility]. Patient initially presented to the emergency room on 05/30/2022 due to complaints of headache, n/v, poor oral intake and chest pain x 1 week.  He was admitted for uremia and metabolic acidosis secondary to stopping dialysis over a year ago and cessation of taking all of his medications. During admission, patient was found to be in A-fib RVR. Per telemetry/EKG review, he was in atrial flutter with RVR upon admission, briefly converted to NSR on 5/5, before returning to atrial flutter then developing atrial fib overnight, rates 120s-150s. He has had several electrolyte derangements. Hgb was 6.7. Rate control was attempted with diltiazem but he continued to have issues with elevated rates and he underwent TEE guided DCCV on 06/19/22. Patient is on Eliquis for a CHADS2VASC score of 2.  On follow up today, patient does report more fatigue recently. He is back in atrial flutter on ECG today. He reports good compliance with HD and with his medications. No bleeding issues on anticoagulation.   Today, he denies symptoms of palpitations, chest pain, shortness of breath, orthopnea, PND, lower extremity edema, dizziness, presyncope, syncope, snoring, daytime somnolence, bleeding, or neurologic sequela. The patient is tolerating medications without difficulties and is otherwise without complaint today.    Atrial Fibrillation Risk Factors:  he does not have symptoms or diagnosis of sleep apnea. he does not have a history of rheumatic fever.   he has a BMI of Body mass index is 21.27 kg/m.Marland Kitchen Filed Weights   07/04/22 1406  Weight: 71.1 kg    No family history on file.   Atrial  Fibrillation Management history:  Previous antiarrhythmic drugs: none Previous cardioversions: 06/19/22 Previous ablations: none Anticoagulation history: Eliquis   Past Medical History:  Diagnosis Date   Anemia    Chronic kidney disease    Dysrhythmia    GERD (gastroesophageal reflux disease)    Neurogenic bladder    Past Surgical History:  Procedure Laterality Date   AV FISTULA PLACEMENT Left 09/27/2020   Procedure: LEFT ARM ARTERIOVENOUS GRAFT PLACEMENT USING GORE-TEX 4-7MM X 45CM  VASCULAR GRAFT;  Surgeon: Larina Earthly, MD;  Location: [facility];  Service: Vascular;  Laterality: Left;   CAPD INSERTION N/A 03/17/2021   Procedure: ATTEMPTED INSERTION OF LAPAROSCOPIC CONTINUOUS AMBULATORY PERITONEAL DIALYSIS (CAPD) CATHETER;  Surgeon: Maeola Harman, MD;  Location: [facility] OR;  Service: Vascular;  Laterality: N/A;   CARDIOVERSION N/A 06/19/2022   Procedure: CARDIOVERSION;  Surgeon: Sande Rives, MD;  Location: [facility] INVASIVE CV LAB;  Service: Cardiovascular;  Laterality: N/A;   CYSTOURETHROSCOPY     INSERTION OF DIALYSIS CATHETER Right 09/27/2020   Procedure: INSERTION OF PALINDROME TUNNELED DIALYSIS CATHETER 14.61f X 23CM;  Surgeon: Larina Earthly, MD;  Location: [facility];  Service: Vascular;  Laterality: Right;   INSERTION OF DIALYSIS CATHETER Right 05/31/2022   Procedure: INSERTION OF DIALYSIS CATHETER;  Surgeon: Victorino Sparrow, MD;  Location: [facility] OR;  Service: Vascular;  Laterality: Right;   IR REMOVAL TUN CV CATH W/O FL  06/20/2022   REMOVAL OF A DIALYSIS CATHETER N/A 12/06/2020   Procedure: MINOR REMOVAL OF A DIALYSIS CATHETER;  Surgeon: Larina Earthly, MD;  Location: [facility];  Service: Vascular;  Laterality: N/A;  SUPRAPUBIC CATHETER PLACEMENT     TEE WITHOUT CARDIOVERSION N/A 06/19/2022   Procedure: TRANSESOPHAGEAL ECHOCARDIOGRAM;  Surgeon: Sande Rives, MD;  Location: [facility] INVASIVE CV LAB;  Service: Cardiovascular;  Laterality: N/A;    Current Outpatient Medications   Medication Sig Dispense Refill   amiodarone (PACERONE) 200 MG tablet Take 1 tablet (200 mg total) by mouth 2 (two) times daily for 30 days, THEN 1 tablet (200 mg total) daily. 60 tablet 2   apixaban (ELIQUIS) 2.5 MG TABS tablet Take 1 tablet (2.5 mg total) by mouth 2 (two) times daily. 60 tablet 0   calcitRIOL (ROCALTROL) 0.5 MCG capsule Take 1 capsule (0.5 mcg total) by mouth daily. 30 capsule 0   calcium acetate (PHOSLO) 667 MG capsule Take 1 capsule (667 mg total) by mouth 3 (three) times daily with meals. 90 capsule 0   diltiazem (CARDIZEM CD) 300 MG 24 hr capsule Take 1 capsule (300 mg total) by mouth daily. 30 capsule 0   loratadine (CLARITIN) 10 MG tablet Take 1 tablet (10 mg total) by mouth daily. 30 tablet 0   melatonin 3 MG TABS tablet Take 1 tablet (3 mg total) by mouth at bedtime as needed. 30 tablet 0   Nutritional Supplements (FEEDING SUPPLEMENT, NEPRO CARB STEADY,) LIQD Take 237 mLs by mouth 2 (two) times daily between meals.  0   vitamin D3 (CHOLECALCIFEROL) 25 MCG tablet Take 1 tablet (1,000 Units total) by mouth daily. 30 tablet 0   No current facility-administered medications for this encounter.    No Known Allergies  Social History   Socioeconomic History   Marital status: Divorced    Spouse name: Not on file   Number of children: Not on file   Years of education: Not on file   Highest education level: Not on file  Occupational History   Not on file  Tobacco Use   Smoking status: Never   Smokeless tobacco: Never  Vaping Use   Vaping Use: Never used  Substance and Sexual Activity   Alcohol use: Yes    Comment: patient states he is a social drinker   Drug use: No   Sexual activity: Not on file  Other Topics Concern   Not on file  Social History Narrative   Not on file   Social Determinants of Health   Financial Resource Strain: Not on file  Food Insecurity: No Food Insecurity (06/18/2022)   Hunger Vital Sign    Worried About Running Out of Food in the  Last Year: Never true    Ran Out of Food in the Last Year: Never true  Transportation Needs: No Transportation Needs (06/18/2022)   PRAPARE - Administrator, Civil Service (Medical): No    Lack of Transportation (Non-Medical): No  Physical Activity: Not on file  Stress: Not on file  Social Connections: Not on file  Intimate Partner Violence: Not At Risk (06/18/2022)   Humiliation, Afraid, Rape, and Kick questionnaire    Fear of Current or Ex-Partner: No    Emotionally Abused: No    Physically Abused: No    Sexually Abused: No     ROS- All systems are reviewed and negative except as per the HPI above.  Physical Exam: Vitals:   07/04/22 1406  BP: 118/64  Pulse: 85  Weight: 71.1 kg  Height: 6' (1.829 m)    GEN- The patient is a well appearing elderly male, alert and oriented x 3 today.   Head- normocephalic, atraumatic Eyes-  Sclera clear, conjunctiva pink Ears- hearing intact Oropharynx- clear Neck- supple  Lungs- Clear to ausculation bilaterally, normal work of breathing Heart- irregular rate and rhythm, no murmurs, rubs or gallops  GI- soft, NT, ND, + BS Extremities- no clubbing, cyanosis, or edema MS- no significant deformity or atrophy Skin- no rash or lesion Psych- euthymic mood, full affect Neuro- strength and sensation are intact  Wt Readings from Last 3 Encounters:  07/04/22 71.1 kg  06/28/22 70.2 kg  06/20/22 72.4 kg    EKG today demonstrates  Typical atrial flutter with variable block Vent. rate 85 BPM PR interval 228 ms QRS duration 146 ms QT/QTcB 432/514 ms  Echo 06/05/22 demonstrated  1. Left ventricular ejection fraction, by estimation, is 55 to 60%. The  left ventricle has normal function. The left ventricle has no regional  wall motion abnormalities. There is mild concentric left ventricular  hypertrophy. Left ventricular diastolic function could not be evaluated.   2. Right ventricular systolic function is normal. The right  ventricular  size is normal. There is mildly elevated pulmonary artery systolic  pressure. The estimated right ventricular systolic pressure is 44.2 mmHg.   3. Left atrial size was moderately dilated.   4. Right atrial size was mildly dilated.   5. The mitral valve is grossly normal. Trivial mitral valve  regurgitation. No evidence of mitral stenosis.   6. The aortic valve is tricuspid. Aortic valve regurgitation is not  visualized. No aortic stenosis is present.   7. The inferior vena cava is dilated in size with <50% respiratory  variability, suggesting right atrial pressure of 15 mmHg.   Epic records are reviewed at length today.  CHA2DS2-VASc Score = 2  The patient's score is based upon: CHF History: 0 HTN History: 0 Diabetes History: 0 Stroke History: 0 Vascular Disease History: 0 Age Score: 2 Gender Score: 0       ASSESSMENT AND PLAN: 1. Persistent Atrial Fibrillation/atrial flutter The patient's CHA2DS2-VASc score is 2, indicating a 2.2% annual risk of stroke.   S/p TEE/DCCV 06/19/22 with quick return of atrial flutter. We discussed rate vs rhythm control today. He would like to try and get back into SR to see if fatigue improves. AAD options are limited with ESRD.  After discussing the risks and benefits, will start amiodarone 200 mg BID x 4 weeks and repeat DCCV after loading. Return for ECG in two weeks. Continue Eliquis 2.5 mg BID (ESRD, age >60) Recent lab work and CXR reviewed.   2. Secondary Hypercoagulable State (ICD10:  D68.69) The patient is at significant risk for stroke/thromboembolism based upon his CHA2DS2-VASc Score of 2.  Continue Apixaban (Eliquis).   3. ESRD HD on Tu/Th/Sat    Follow up in the AF clinic in two weeks for ECG.    Jorja Loa PA-C [facility] [address] [place], [place] [address] [phone] 07/04/2022 3:26 PM

## 2022-07-05 DIAGNOSIS — N186 End stage renal disease: Secondary | ICD-10-CM | POA: Diagnosis not present

## 2022-07-05 DIAGNOSIS — Z992 Dependence on renal dialysis: Secondary | ICD-10-CM | POA: Diagnosis not present

## 2022-07-05 DIAGNOSIS — D509 Iron deficiency anemia, unspecified: Secondary | ICD-10-CM | POA: Diagnosis not present

## 2022-07-05 DIAGNOSIS — D631 Anemia in chronic kidney disease: Secondary | ICD-10-CM | POA: Diagnosis not present

## 2022-07-05 DIAGNOSIS — Z23 Encounter for immunization: Secondary | ICD-10-CM | POA: Diagnosis not present

## 2022-07-05 DIAGNOSIS — N2581 Secondary hyperparathyroidism of renal origin: Secondary | ICD-10-CM | POA: Diagnosis not present

## 2022-07-07 DIAGNOSIS — N186 End stage renal disease: Secondary | ICD-10-CM | POA: Diagnosis not present

## 2022-07-07 DIAGNOSIS — D631 Anemia in chronic kidney disease: Secondary | ICD-10-CM | POA: Diagnosis not present

## 2022-07-07 DIAGNOSIS — D509 Iron deficiency anemia, unspecified: Secondary | ICD-10-CM | POA: Diagnosis not present

## 2022-07-07 DIAGNOSIS — Z992 Dependence on renal dialysis: Secondary | ICD-10-CM | POA: Diagnosis not present

## 2022-07-07 DIAGNOSIS — Z23 Encounter for immunization: Secondary | ICD-10-CM | POA: Diagnosis not present

## 2022-07-07 DIAGNOSIS — N2581 Secondary hyperparathyroidism of renal origin: Secondary | ICD-10-CM | POA: Diagnosis not present

## 2022-07-10 DIAGNOSIS — Z992 Dependence on renal dialysis: Secondary | ICD-10-CM | POA: Diagnosis not present

## 2022-07-10 DIAGNOSIS — D509 Iron deficiency anemia, unspecified: Secondary | ICD-10-CM | POA: Diagnosis not present

## 2022-07-10 DIAGNOSIS — N2581 Secondary hyperparathyroidism of renal origin: Secondary | ICD-10-CM | POA: Diagnosis not present

## 2022-07-10 DIAGNOSIS — D631 Anemia in chronic kidney disease: Secondary | ICD-10-CM | POA: Diagnosis not present

## 2022-07-10 DIAGNOSIS — N186 End stage renal disease: Secondary | ICD-10-CM | POA: Diagnosis not present

## 2022-07-10 DIAGNOSIS — Z23 Encounter for immunization: Secondary | ICD-10-CM | POA: Diagnosis not present

## 2022-07-11 ENCOUNTER — Encounter (HOSPITAL_COMMUNITY): Payer: Self-pay

## 2022-07-11 ENCOUNTER — Emergency Department (HOSPITAL_COMMUNITY): Payer: Medicare Other

## 2022-07-11 ENCOUNTER — Emergency Department (HOSPITAL_COMMUNITY)
Admission: EM | Admit: 2022-07-11 | Discharge: 2022-07-12 | Disposition: A | Discharge status: Home / Self Care | Payer: Medicare Other | Attending: Emergency Medicine | Admitting: Emergency Medicine

## 2022-07-11 ENCOUNTER — Other Ambulatory Visit: Payer: Self-pay

## 2022-07-11 DIAGNOSIS — N3289 Other specified disorders of bladder: Secondary | ICD-10-CM | POA: Diagnosis not present

## 2022-07-11 DIAGNOSIS — T83098A Other mechanical complication of other indwelling urethral catheter, initial encounter: Secondary | ICD-10-CM | POA: Diagnosis not present

## 2022-07-11 DIAGNOSIS — T839XXA Unspecified complication of genitourinary prosthetic device, implant and graft, initial encounter: Secondary | ICD-10-CM | POA: Diagnosis not present

## 2022-07-11 DIAGNOSIS — Y732 Prosthetic and other implants, materials and accessory gastroenterology and urology devices associated with adverse incidents: Secondary | ICD-10-CM | POA: Diagnosis not present

## 2022-07-11 DIAGNOSIS — N3000 Acute cystitis without hematuria: Secondary | ICD-10-CM | POA: Diagnosis not present

## 2022-07-11 DIAGNOSIS — I4892 Unspecified atrial flutter: Secondary | ICD-10-CM | POA: Diagnosis not present

## 2022-07-11 LAB — BASIC METABOLIC PANEL
Anion gap: 14 (ref 5–15)
BUN: 45 mg/dL — ABNORMAL HIGH (ref 8–23)
CO2: 28 mmol/L (ref 22–32)
Calcium: 8.3 mg/dL — ABNORMAL LOW (ref 8.9–10.3)
Chloride: 97 mmol/L — ABNORMAL LOW (ref 98–111)
Creatinine, Ser: 5.71 mg/dL — ABNORMAL HIGH (ref 0.61–1.24)
GFR, Estimated: 9 mL/min — ABNORMAL LOW (ref >60)
Glucose, Bld: 85 mg/dL (ref 70–99)
Potassium: 4 mmol/L (ref 3.5–5.1)
Sodium: 139 mmol/L (ref 135–145)

## 2022-07-11 LAB — CBC WITH DIFFERENTIAL/PLATELET
Abs Immature Granulocytes: 0.01 10*3/uL (ref 0.00–0.07)
Basophils Absolute: 0.1 10*3/uL (ref 0.0–0.1)
Basophils Relative: 1 %
Eosinophils Absolute: 0.2 10*3/uL (ref 0.0–0.5)
Eosinophils Relative: 4 %
HCT: 32.7 % — ABNORMAL LOW (ref 39.0–52.0)
Hemoglobin: 9.9 g/dL — ABNORMAL LOW (ref 13.0–17.0)
Immature Granulocytes: 0 %
Lymphocytes Relative: 18 %
Lymphs Abs: 1.2 10*3/uL (ref 0.7–4.0)
MCH: 29.2 pg (ref 26.0–34.0)
MCHC: 30.3 g/dL (ref 30.0–36.0)
MCV: 96.5 fL (ref 80.0–100.0)
Monocytes Absolute: 0.6 10*3/uL (ref 0.1–1.0)
Monocytes Relative: 9 %
Neutro Abs: 4.5 10*3/uL (ref 1.7–7.7)
Neutrophils Relative %: 68 %
Platelets: 178 10*3/uL (ref 150–400)
RBC: 3.39 MIL/uL — ABNORMAL LOW (ref 4.22–5.81)
RDW: 15.8 % — ABNORMAL HIGH (ref 11.5–15.5)
WBC: 6.5 10*3/uL (ref 4.0–10.5)
nRBC: 0 % (ref 0.0–0.2)

## 2022-07-11 LAB — URINALYSIS, ROUTINE W REFLEX MICROSCOPIC
Bilirubin Urine: NEGATIVE
Glucose, UA: NEGATIVE mg/dL
Ketones, ur: NEGATIVE mg/dL
Nitrite: NEGATIVE
Protein, ur: >=300 mg/dL — AB
RBC / HPF: >50 RBC/hpf (ref 0–5)
Specific Gravity, Urine: 1.008 (ref 1.005–1.030)
WBC, UA: >50 WBC/hpf (ref 0–5)
pH: 8 (ref 5.0–8.0)

## 2022-07-11 MED ORDER — SODIUM CHLORIDE 0.9 % IV BOLUS
250.0000 mL | Freq: Once | INTRAVENOUS | Status: AC
  Administered 2022-07-11: 250 mL via INTRAVENOUS

## 2022-07-11 MED ORDER — APIXABAN 2.5 MG PO TABS
2.5000 mg | ORAL_TABLET | Freq: Once | ORAL | Status: AC
  Administered 2022-07-11: 2.5 mg via ORAL
  Filled 2022-07-11: qty 1

## 2022-07-11 MED ORDER — ONDANSETRON HCL 4 MG/2ML IJ SOLN
4.0000 mg | Freq: Once | INTRAMUSCULAR | Status: AC
  Administered 2022-07-11: 4 mg via INTRAVENOUS
  Filled 2022-07-11: qty 2

## 2022-07-11 MED ORDER — MORPHINE SULFATE (PF) 4 MG/ML IV SOLN: 4.0000 mg | Freq: Once | INTRAVENOUS | Status: DC

## 2022-07-11 MED ORDER — AMIODARONE HCL 200 MG PO TABS
200.0000 mg | ORAL_TABLET | Freq: Once | ORAL | Status: AC
  Administered 2022-07-11: 200 mg via ORAL
  Filled 2022-07-11: qty 1

## 2022-07-11 MED ORDER — MORPHINE SULFATE (PF) 2 MG/ML IV SOLN
2.0000 mg | Freq: Once | INTRAVENOUS | Status: AC
  Administered 2022-07-11: 2 mg via INTRAVENOUS
  Filled 2022-07-11: qty 1

## 2022-07-11 NOTE — ED Provider Notes (Signed)
EMERGENCY DEPARTMENT AT [facility] Provider Note   CSN: [id number] Arrival date & time: 07/11/22  1518     History  Chief Complaint  Patient presents with   suprapubic catheter not draining    Glenn Olson is a 82 y.o. male with a history including anemia, chronic kidney disease who has recently been placed back on dialysis, GERD and neurogenic bladder for which he has a suprapubic catheter.  It sounds like he makes a fair amount of urine, stating that he looks his catheter to a bag overnight and suggests its at least a quarter to half full by morning.  During the daytime he does not attach it to a bag but occasionally will just empty the tube over the toilet.  He has complaints of burning pain in his suprapubic area and does not feel like he has had any urinary output since yesterday.  However he did attach the catheter to his back last night, when he woke this morning he was in such suprapubic pain he did not look at the bag, does not know if there is urine in it.  He feels like he needs to urinate but has not had any production of urine today.  However, of note there is urine throughout the catheter tubing itself.  Denies fevers or chills, nausea or vomiting.  His last dialysis was yesterday.  The history is provided by the patient.       Home Medications Prior to Admission medications   Medication Sig Start Date End Date Taking? Authorizing Provider  amiodarone (PACERONE) 200 MG tablet Take 1 tablet (200 mg total) by mouth 2 (two) times daily for 30 days, THEN 1 tablet (200 mg total) daily. 07/04/22 07/29/23  Fenton, Clint R, PA  apixaban (ELIQUIS) 2.5 MG TABS tablet Take 1 tablet (2.5 mg total) by mouth 2 (two) times daily. 06/28/22   Love, Evlyn Kanner, PA-C  calcitRIOL (ROCALTROL) 0.5 MCG capsule Take 1 capsule (0.5 mcg total) by mouth daily. 06/28/22   Love, Evlyn Kanner, PA-C  calcium acetate (PHOSLO) 667 MG capsule Take 1 capsule (667 mg total) by mouth 3 (three)  times daily with meals. 06/28/22   Love, Evlyn Kanner, PA-C  diltiazem (CARDIZEM CD) 300 MG 24 hr capsule Take 1 capsule (300 mg total) by mouth daily. 06/28/22   Love, Evlyn Kanner, PA-C  loratadine (CLARITIN) 10 MG tablet Take 1 tablet (10 mg total) by mouth daily. 06/29/22   Love, Evlyn Kanner, PA-C  melatonin 3 MG TABS tablet Take 1 tablet (3 mg total) by mouth at bedtime as needed. 06/28/22   Love, Evlyn Kanner, PA-C  Nutritional Supplements (FEEDING SUPPLEMENT, NEPRO CARB STEADY,) LIQD Take 237 mLs by mouth 2 (two) times daily between meals. 06/20/22   Lanae Boast, MD  vitamin D3 (CHOLECALCIFEROL) 25 MCG tablet Take 1 tablet (1,000 Units total) by mouth daily. 06/29/22   Jacquelynn Cree, PA-C      Allergies    Patient has no known allergies.    Review of Systems   Review of Systems  Constitutional:  Negative for chills and fever.  HENT:  Negative for congestion and sore throat.   Eyes: Negative.   Respiratory:  Negative for chest tightness and shortness of breath.   Cardiovascular:  Negative for chest pain.  Gastrointestinal:  Negative for abdominal pain, nausea and vomiting.  Genitourinary:  Positive for decreased urine volume and dysuria.  Musculoskeletal:  Negative for arthralgias, joint swelling and neck pain.  Skin:  Negative.  Negative for rash and wound.  Neurological:  Negative for dizziness, weakness, light-headedness, numbness and headaches.  Psychiatric/Behavioral: Negative.    All other systems reviewed and are negative.   Physical Exam Updated Vital Signs BP (!) 143/116   Pulse (!) 110   Temp 97.8 F (36.6 C) (Oral)   Resp 17   Ht 6' (1.829 m)   Wt 71.1 kg   SpO2 95%   BMI 21.27 kg/m  Physical Exam Vitals and nursing note reviewed.  Constitutional:      Appearance: He is well-developed.  HENT:     Head: Normocephalic and atraumatic.  Eyes:     Conjunctiva/sclera: Conjunctivae normal.  Cardiovascular:     Rate and Rhythm: Regular rhythm. Tachycardia present.     Heart  sounds: Normal heart sounds.  Pulmonary:     Effort: Pulmonary effort is normal.     Breath sounds: Normal breath sounds. No wheezing.  Abdominal:     General: Bowel sounds are normal.     Palpations: Abdomen is soft.     Tenderness: There is no abdominal tenderness.  Musculoskeletal:        General: Normal range of motion.     Cervical back: Normal range of motion.  Skin:    General: Skin is warm and dry.  Neurological:     Mental Status: He is alert.     ED Results / Procedures / Treatments   Labs (all labs ordered are listed, but only abnormal results are displayed) Labs Reviewed  CBC WITH DIFFERENTIAL/PLATELET - Abnormal; Notable for the following components:      Result Value   RBC 3.39 (*)    Hemoglobin 9.9 (*)    HCT 32.7 (*)    RDW 15.8 (*)    All other components within normal limits  BASIC METABOLIC PANEL - Abnormal; Notable for the following components:   Chloride 97 (*)    BUN 45 (*)    Creatinine, Ser 5.71 (*)    Calcium 8.3 (*)    GFR, Estimated 9 (*)    All other components within normal limits  URINALYSIS, ROUTINE W REFLEX MICROSCOPIC - Abnormal; Notable for the following components:   APPearance CLOUDY (*)    Hgb urine dipstick MODERATE (*)    Protein, ur >=300 (*)    Leukocytes,Ua LARGE (*)    Bacteria, UA MANY (*)    All other components within normal limits    EKG None  Radiology CT Renal Stone Study  Result Date: 07/11/2022 CLINICAL DATA:  Decreased output from urinary suprapubic catheter EXAM: CT ABDOMEN AND PELVIS WITHOUT CONTRAST TECHNIQUE: Multidetector CT imaging of the abdomen and pelvis was performed following the standard protocol without IV contrast. RADIATION DOSE REDUCTION: This exam was performed according to the departmental dose-optimization program which includes automated exposure control, adjustment of the mA and/or kV according to patient size and/or use of iterative reconstruction technique. COMPARISON:  None Available.  FINDINGS: Lower chest: Lung bases demonstrate no focal infiltrate or sizable effusion. Hepatobiliary: No focal liver abnormality is seen. No gallstones, gallbladder wall thickening, or biliary dilatation. Pancreas: Unremarkable. No pancreatic ductal dilatation or surrounding inflammatory changes. Spleen: Normal in size without focal abnormality. Adrenals/Urinary Tract: Adrenal glands are within normal limits. Kidneys are shrunken with renal cystic change consistent with the known history of underlying renal failure and dialysis. No obstructive changes are seen. The bladder is partially distended. A suprapubic catheter is noted alt with the balloon within the lumen of the  bladder although the tip of the catheter appears to extend into the left lateral bladder wall. No evidence of perforation is seen. This may be the etiology of the decreased urinary output. Small posterior bladder diverticulum is noted on the right. Stomach/Bowel: No obstructive or inflammatory changes of the colon are seen. The appendix is not visualized. No inflammatory changes to suggest appendicitis are noted. Small bowel and stomach are within normal limits. Vascular/Lymphatic: Aortic calcifications are noted without aneurysmal dilatation. Reproductive: Prostate is within normal limits. Other: No abdominal wall hernia or abnormality. No abdominopelvic ascites. Musculoskeletal: No acute or significant osseous findings. IMPRESSION: Suprapubic catheter is noted with the balloon within the lumen of the bladder. The tip however appears to of tunneled into the left lateral bladder wall likely contributing to the decreased urinary output. The bladder is only partially distended however. No other focal abnormality is noted. Electronically Signed   By: Alcide Clever M.D.   On: 07/11/2022 22:47    Procedures Procedures    Medications Ordered in ED Medications  cefTRIAXone (ROCEPHIN) 2 g in sodium chloride 0.9 % 100 mL IVPB (has no administration in  time range)  ondansetron (ZOFRAN) injection 4 mg (4 mg Intravenous Given 07/11/22 1915)  morphine (PF) 2 MG/ML injection 2 mg (2 mg Intravenous Given 07/11/22 1916)  sodium chloride 0.9 % bolus 250 mL (0 mLs Intravenous Stopped 07/11/22 2022)  apixaban (ELIQUIS) tablet 2.5 mg (2.5 mg Oral Given 07/11/22 2305)  amiodarone (PACERONE) tablet 200 mg (200 mg Oral Given 07/11/22 2305)  diltiazem (CARDIZEM) injection 20 mg (20 mg Intravenous Given 07/12/22 0113)    ED Course/ Medical Decision Making/ A&P                             Medical Decision Making Patient presenting with dysuria and reduced urine output from his suprapubic catheter.  He has a sensation of an over full bladder despite a bladder scan which does not reveal significant amounts of urine.  He has a history of occasional UTIs.  His catheter tubing was replaced last week.  There is visible urine in the tube itself, but not enough to run a urinalysis.  Patient was given 250 cc bolus normal saline and attempt to obtain a urine specimen.  He was eventually able to give [place] to specimen, it appears he does have a UTI with bacteria and large leukocytes.  CT imaging was also completed while awaiting this urine specimen and shows that the tip of his catheter has tunneled into the left lateral bladder wall.  Patient is also found to be tachycardic here 1 10-1 20 range.  He has a history of A-fib and is on Eliquis.  He was cardioverted and is currently on amiodarone and diltiazem for rate control.  He has taken his diltiazem for today, he has missed his amiodarone dose since being here in the ED, also missed his evening Eliquis dose, both of these medicines were given him here.  Pending consultation from urology regarding this embedded catheter.  Dr. Pilar Plate assuming care of patient.   Amount and/or Complexity of Data Reviewed Labs: ordered.    Details: Per above, UTI with many bacteria large amount of leukocytes.  His be met is baseline, his BUN is  45 and his creatinine is 5.71 consistent with his renal failure.  His CBC shows a normal WBC count, he has a hemoglobin of 9.9 which is improved from prior hemoglobins. Radiology: ordered.  Details: Per above results. Discussion of management or test interpretation with external provider(s): Consult to Dr. Mena Goes regarding catheter complication.  Risk Prescription drug management.           Final Clinical Impression(s) / ED Diagnoses Final diagnoses:  Atrial flutter with rapid ventricular response (HCC)  Problem with Foley catheter, initial encounter [facility])    Rx / DC Orders ED Discharge Orders     None         Victoriano Lain 07/12/22 0142    Vanetta Mulders, MD 07/16/22 1208

## 2022-07-11 NOTE — ED Notes (Signed)
Patient transported to CT 

## 2022-07-11 NOTE — ED Notes (Signed)
Pt's O2 desats to 89-91% while asleep on RA. When awake, pt's O2 is 93% or better on RA

## 2022-07-11 NOTE — ED Notes (Signed)
Attempted to collect urine, only a drop provided at this time

## 2022-07-11 NOTE — ED Triage Notes (Signed)
Pt reports he thinks he has not had any urinary output from his suprapubic catheter since yesterday and feels like he is "going to bust".  Pt is a dialysis pt and is not sure how much urine he even makes.  He does not wear a drainage bag, keeps a plug on the end of the catheter and randomly unplugs and drains it over the commode.

## 2022-07-11 NOTE — ED Notes (Signed)
ED Provider at bedside. 

## 2022-07-12 DIAGNOSIS — N186 End stage renal disease: Secondary | ICD-10-CM | POA: Diagnosis not present

## 2022-07-12 DIAGNOSIS — N2581 Secondary hyperparathyroidism of renal origin: Secondary | ICD-10-CM | POA: Diagnosis not present

## 2022-07-12 DIAGNOSIS — Z23 Encounter for immunization: Secondary | ICD-10-CM | POA: Diagnosis not present

## 2022-07-12 DIAGNOSIS — I4892 Unspecified atrial flutter: Secondary | ICD-10-CM | POA: Diagnosis not present

## 2022-07-12 DIAGNOSIS — D631 Anemia in chronic kidney disease: Secondary | ICD-10-CM | POA: Diagnosis not present

## 2022-07-12 DIAGNOSIS — Z992 Dependence on renal dialysis: Secondary | ICD-10-CM | POA: Diagnosis not present

## 2022-07-12 DIAGNOSIS — D509 Iron deficiency anemia, unspecified: Secondary | ICD-10-CM | POA: Diagnosis not present

## 2022-07-12 MED ORDER — CEPHALEXIN 500 MG PO CAPS: 500.0000 mg | ORAL_CAPSULE | Freq: Two times a day (BID) | ORAL | 0 refills | Status: AC

## 2022-07-12 MED ORDER — DILTIAZEM HCL 25 MG/5ML IV SOLN
20.0000 mg | Freq: Once | INTRAVENOUS | Status: AC
  Administered 2022-07-12: 20 mg via INTRAVENOUS
  Filled 2022-07-12: qty 5

## 2022-07-12 MED ORDER — ETOMIDATE 2 MG/ML IV SOLN
10.0000 mg | Freq: Once | INTRAVENOUS | Status: AC
  Administered 2022-07-12: 10 mg via INTRAVENOUS
  Filled 2022-07-12: qty 10

## 2022-07-12 MED ORDER — SODIUM CHLORIDE 0.9 % IV SOLN
2.0000 g | Freq: Once | INTRAVENOUS | Status: AC
  Administered 2022-07-12: 2 g via INTRAVENOUS
  Filled 2022-07-12: qty 20

## 2022-07-12 NOTE — ED Notes (Signed)
Paper scrubs placed on pt

## 2022-07-12 NOTE — ED Notes (Signed)
Urology paged to PA Idol x2

## 2022-07-12 NOTE — ED Notes (Signed)
Dr Mena Goes paged for third time to EDP Bero @ [phone] @ [phone]

## 2022-07-12 NOTE — Discharge Instructions (Signed)
You were evaluated in the Emergency Department and after careful evaluation, we did not find any emergent condition requiring admission or further testing in the hospital.  Your exam/testing today is overall reassuring.  We replaced your catheter here in the emergency department.  We are suspicious for urinary tract infection.  Take the Keflex antibiotic once daily.  We also recommend taking an extra tablet after each of your dialysis sessions.  We also did a cardioversion procedure here in the emergency department because you were in atrial flutter.  Recommend taking all your home medications as directed and following up with cardiology.  Please return to the Emergency Department if you experience any worsening of your condition.   Thank you for allowing [place] to be a part of your care.

## 2022-07-12 NOTE — ED Notes (Signed)
ED Provider at bedside. 

## 2022-07-12 NOTE — ED Provider Notes (Signed)
Provider Note MRN:  [id number]  Arrival date & time: 07/12/22    ED Course and Medical Decision Making  Assumed care from PA Idol at shift change.  Catheter not draining properly.  Imaging revealing catheter tip may be embedded in the bladder wall.  Will discuss with urology.  Patient also found to be in atrial flutter with RVR.  Recent cardioversion for the same.  Will provide some diltiazem and reassess.  3:45 AM update: Patient had persistent atrial flutter with RVR.  He endorses full compliance with his medications including his anticoagulant.  Good candidate for cardioversion performed as described below.  Patient is now in sinus rhythm when he is fully recovered from the sedation.  Treating him for a UTI given the very cloudy and foul-smelling urine in the urinalysis results.  I discussed the case with Dr. Mena Goes regarding the CT findings and the concern for the tip of the catheter.  Dr. Mena Goes highly doubts that the tip of the catheter is lodged anywhere dangerously, recommending removal and replacement which was done.  Patient is comfortable, urine draining without issue, appropriate for discharge.  .Sedation  Date/Time: 07/12/2022 3:52 AM  Performed by: Sabas Sous, MD Authorized by: Sabas Sous, MD   Consent:    Consent obtained:  Verbal and written   Consent given by:  Patient   Risks discussed:  Allergic reaction, dysrhythmia, inadequate sedation, nausea, vomiting, respiratory compromise necessitating ventilatory assistance and intubation and prolonged hypoxia resulting in organ damage Universal protocol:    Immediately prior to procedure, a time out was called: yes     Patient identity confirmed:  Verbally with patient Indications:    Procedure performed:  Cardioversion   Procedure necessitating sedation performed by:  Physician performing sedation Pre-sedation assessment:    Time since last food or drink:  4 hours   ASA classification: class 2 - patient with mild  systemic disease     Mouth opening:  3 or more finger widths   Mallampati score:  I - soft palate, uvula, fauces, pillars visible   Neck mobility: normal     Pre-sedation assessments completed and reviewed: airway patency, cardiovascular function, hydration status, mental status, nausea/vomiting, pain level, respiratory function and temperature   Immediate pre-procedure details:    Reassessment: Patient reassessed immediately prior to procedure     Reviewed: vital signs, relevant labs/tests and NPO status     Verified: bag valve mask available, emergency equipment available, intubation equipment available, IV patency confirmed, oxygen available and suction available   Procedure details (see MAR for exact dosages):    Preoxygenation:  Nasal cannula   Sedation:  Etomidate   Intended level of sedation: deep   Intra-procedure monitoring:  Blood pressure monitoring, cardiac monitor, continuous pulse oximetry, continuous capnometry, frequent vital sign checks and frequent LOC assessments   Intra-procedure events: none     Total Provider sedation time (minutes):  24 Post-procedure details:    Attendance: Constant attendance by certified staff until patient recovered     Recovery: Patient returned to pre-procedure baseline     Post-sedation assessments completed and reviewed: airway patency, cardiovascular function, hydration status, mental status, nausea/vomiting, pain level, respiratory function and temperature     Patient is stable for discharge or admission: yes     Procedure completion:  Tolerated well, no immediate complications .Cardioversion  Date/Time: 07/12/2022 3:53 AM  Performed by: Sabas Sous, MD Authorized by: Sabas Sous, MD   Consent:    Consent obtained:  Verbal   Consent given by:  Patient   Risks discussed:  Cutaneous burn, death, induced arrhythmia and pain Pre-procedure details:    Cardioversion basis:  Elective   Rhythm:  Atrial flutter   Electrode placement:   Anterior-posterior Patient sedated: Yes. Refer to sedation procedure documentation for details of sedation.  Attempt one:    Cardioversion mode:  Synchronous   Waveform:  Biphasic   Shock (Joules):  150   Shock outcome:  Conversion to normal sinus rhythm Post-procedure details:    Patient status:  Awake   Patient tolerance of procedure:  Tolerated well, no immediate complications .Critical Care  Performed by: Sabas Sous, MD Authorized by: Sabas Sous, MD   Critical care provider statement:    Critical care time (minutes):  35   Critical care was necessary to treat or prevent imminent or life-threatening deterioration of the following conditions: atrial flutter with RVR.   Critical care was time spent personally by me on the following activities:  Development of treatment plan with patient or surrogate, discussions with consultants, evaluation of patient's response to treatment, examination of patient, ordering and review of laboratory studies, ordering and review of radiographic studies, ordering and performing treatments and interventions, pulse oximetry, re-evaluation of patient's condition and review of old charts BLADDER CATHETERIZATION  Date/Time: 07/12/2022 3:54 AM  Performed by: Sabas Sous, MD Authorized by: Sabas Sous, MD   Consent:    Consent obtained:  Verbal   Consent given by:  Patient   Risks, benefits, and alternatives were discussed: yes     Risks discussed:  False passage, incomplete procedure, pain, infection and urethral injury Universal protocol:    Procedure explained and questions answered to patient or proxy's satisfaction: yes     Immediately prior to procedure, a time out was called: yes     Patient identity confirmed:  Verbally with patient Pre-procedure details:    Procedure purpose:  Therapeutic   Preparation: Patient was prepped and draped in usual sterile fashion   Anesthesia:    Anesthesia method:  None Procedure details:     Procedure performed by provider due to: suprapubic catheter.   Catheter type:  Foley   Catheter size:  16 Fr   Bladder irrigation: no     Number of attempts:  1   Urine characteristics:  Cloudy, yellow and foul smelling Post-procedure details:    Procedure completion:  Tolerated well, no immediate complications   Final Clinical Impressions(s) / ED Diagnoses     ICD-10-CM   1. Atrial flutter with rapid ventricular response (HCC)  I48.92     2. Problem with Foley catheter, initial encounter (HCC)  T83.9XXA     3. Acute cystitis without hematuria  N30.00       ED Discharge Orders          Ordered    cephALEXin (KEFLEX) 500 MG capsule  2 times daily        07/12/22 0351              Discharge Instructions      You were evaluated in the Emergency Department and after careful evaluation, we did not find any emergent condition requiring admission or further testing in the hospital.  Your exam/testing today is overall reassuring.  We replaced your catheter here in the emergency department.  We are suspicious for urinary tract infection.  Take the Keflex antibiotic once daily.  We also recommend taking an extra tablet after each of your  dialysis sessions.  We also did a cardioversion procedure here in the emergency department because you were in atrial flutter.  Recommend taking all your home medications as directed and following up with cardiology.  Please return to the Emergency Department if you experience any worsening of your condition.   Thank you for allowing [place] to be a part of your care.      Elmer Sow. Pilar Plate, MD [facility] [email or web address]    Sabas Sous, MD 07/12/22 [phone]

## 2022-07-12 NOTE — ED Notes (Signed)
Pt placed on 2L  as pt's O2 sat's drop to 90% while asleep. Pt now at 94%

## 2022-07-12 NOTE — Progress Notes (Signed)
Present during cardioversion.  Patient back in sinus rhythm at 77 bpm.  ETCO2 detector placed before procedure and patient did well during procedure but did have a small desat episode after cardioversion.  Took patient up to 5 L to keep sat above 90%, but upon stimulation, patient came right back up and is now back on 2L [place] with a current sat of 94%.  RN at bedside.

## 2022-07-14 DIAGNOSIS — N186 End stage renal disease: Secondary | ICD-10-CM | POA: Diagnosis not present

## 2022-07-14 DIAGNOSIS — Z992 Dependence on renal dialysis: Secondary | ICD-10-CM | POA: Diagnosis not present

## 2022-07-14 DIAGNOSIS — D509 Iron deficiency anemia, unspecified: Secondary | ICD-10-CM | POA: Diagnosis not present

## 2022-07-14 DIAGNOSIS — N2581 Secondary hyperparathyroidism of renal origin: Secondary | ICD-10-CM | POA: Diagnosis not present

## 2022-07-14 DIAGNOSIS — Z23 Encounter for immunization: Secondary | ICD-10-CM | POA: Diagnosis not present

## 2022-07-14 DIAGNOSIS — D631 Anemia in chronic kidney disease: Secondary | ICD-10-CM | POA: Diagnosis not present

## 2022-07-17 DIAGNOSIS — N186 End stage renal disease: Secondary | ICD-10-CM | POA: Diagnosis not present

## 2022-07-17 DIAGNOSIS — D509 Iron deficiency anemia, unspecified: Secondary | ICD-10-CM | POA: Diagnosis not present

## 2022-07-17 DIAGNOSIS — Z992 Dependence on renal dialysis: Secondary | ICD-10-CM | POA: Diagnosis not present

## 2022-07-17 DIAGNOSIS — D631 Anemia in chronic kidney disease: Secondary | ICD-10-CM | POA: Diagnosis not present

## 2022-07-17 DIAGNOSIS — Z23 Encounter for immunization: Secondary | ICD-10-CM | POA: Diagnosis not present

## 2022-07-17 DIAGNOSIS — N2581 Secondary hyperparathyroidism of renal origin: Secondary | ICD-10-CM | POA: Diagnosis not present

## 2022-07-18 ENCOUNTER — Ambulatory Visit (HOSPITAL_COMMUNITY)
Admission: RE | Admit: 2022-07-18 | Discharge: 2022-07-18 | Disposition: A | Discharge status: Home / Self Care | Payer: Medicare Other | Source: Ambulatory Visit | Attending: Physician Assistant | Admitting: Physician Assistant

## 2022-07-18 DIAGNOSIS — D6869 Other thrombophilia: Secondary | ICD-10-CM

## 2022-07-18 DIAGNOSIS — Z5181 Encounter for therapeutic drug level monitoring: Secondary | ICD-10-CM

## 2022-07-18 DIAGNOSIS — Z79899 Other long term (current) drug therapy: Secondary | ICD-10-CM | POA: Diagnosis not present

## 2022-07-18 DIAGNOSIS — I483 Typical atrial flutter: Secondary | ICD-10-CM | POA: Insufficient documentation

## 2022-07-18 MED ORDER — AMIODARONE HCL 200 MG PO TABS: 200.0000 mg | ORAL_TABLET | Freq: Every day | ORAL | 1 refills | Status: DC

## 2022-07-18 NOTE — Progress Notes (Signed)
Patient returns for ECG after starting amiodarone. ECG shows:  SR, RBBB, 1st degree AV block, LAFB Vent. rate 79 BPM PR interval 204 ms QRS duration 146 ms QT/QTcB 442/506 ms  Patient seen at the ED 07/11/22 for an issue with his suprapubic catheter being embedded in bladder wall. He was also in atrial flutter with elevated rates and underwent DCCV. Continue amiodarone 200 mg BID x 2 weeks then decrease to once daily. F/u in the AF clinic in 3 months.

## 2022-07-18 NOTE — Patient Instructions (Signed)
In 2 weeks reduce amiodarone to once a day

## 2022-07-19 DIAGNOSIS — D631 Anemia in chronic kidney disease: Secondary | ICD-10-CM | POA: Diagnosis not present

## 2022-07-19 DIAGNOSIS — N186 End stage renal disease: Secondary | ICD-10-CM | POA: Diagnosis not present

## 2022-07-19 DIAGNOSIS — Z992 Dependence on renal dialysis: Secondary | ICD-10-CM | POA: Diagnosis not present

## 2022-07-19 DIAGNOSIS — D509 Iron deficiency anemia, unspecified: Secondary | ICD-10-CM | POA: Diagnosis not present

## 2022-07-19 DIAGNOSIS — N2581 Secondary hyperparathyroidism of renal origin: Secondary | ICD-10-CM | POA: Diagnosis not present

## 2022-07-19 DIAGNOSIS — Z23 Encounter for immunization: Secondary | ICD-10-CM | POA: Diagnosis not present

## 2022-07-20 DIAGNOSIS — R5381 Other malaise: Secondary | ICD-10-CM | POA: Diagnosis not present

## 2022-07-20 DIAGNOSIS — Z992 Dependence on renal dialysis: Secondary | ICD-10-CM | POA: Diagnosis not present

## 2022-07-20 DIAGNOSIS — I480 Paroxysmal atrial fibrillation: Secondary | ICD-10-CM | POA: Diagnosis not present

## 2022-07-20 DIAGNOSIS — E782 Mixed hyperlipidemia: Secondary | ICD-10-CM | POA: Diagnosis not present

## 2022-07-20 DIAGNOSIS — I120 Hypertensive chronic kidney disease with stage 5 chronic kidney disease or end stage renal disease: Secondary | ICD-10-CM | POA: Diagnosis not present

## 2022-07-20 DIAGNOSIS — E876 Hypokalemia: Secondary | ICD-10-CM | POA: Diagnosis not present

## 2022-07-20 DIAGNOSIS — N3000 Acute cystitis without hematuria: Secondary | ICD-10-CM | POA: Diagnosis not present

## 2022-07-20 DIAGNOSIS — D638 Anemia in other chronic diseases classified elsewhere: Secondary | ICD-10-CM | POA: Diagnosis not present

## 2022-07-20 DIAGNOSIS — K219 Gastro-esophageal reflux disease without esophagitis: Secondary | ICD-10-CM | POA: Diagnosis not present

## 2022-07-20 DIAGNOSIS — R7301 Impaired fasting glucose: Secondary | ICD-10-CM | POA: Diagnosis not present

## 2022-07-20 DIAGNOSIS — N319 Neuromuscular dysfunction of bladder, unspecified: Secondary | ICD-10-CM | POA: Diagnosis not present

## 2022-07-20 DIAGNOSIS — N186 End stage renal disease: Secondary | ICD-10-CM | POA: Diagnosis not present

## 2022-07-21 DIAGNOSIS — Z992 Dependence on renal dialysis: Secondary | ICD-10-CM | POA: Diagnosis not present

## 2022-07-21 DIAGNOSIS — Z23 Encounter for immunization: Secondary | ICD-10-CM | POA: Diagnosis not present

## 2022-07-21 DIAGNOSIS — N2581 Secondary hyperparathyroidism of renal origin: Secondary | ICD-10-CM | POA: Diagnosis not present

## 2022-07-21 DIAGNOSIS — D631 Anemia in chronic kidney disease: Secondary | ICD-10-CM | POA: Diagnosis not present

## 2022-07-21 DIAGNOSIS — D509 Iron deficiency anemia, unspecified: Secondary | ICD-10-CM | POA: Diagnosis not present

## 2022-07-21 DIAGNOSIS — N186 End stage renal disease: Secondary | ICD-10-CM | POA: Diagnosis not present

## 2022-07-23 ENCOUNTER — Other Ambulatory Visit (HOSPITAL_COMMUNITY): Payer: Self-pay | Admitting: *Deleted

## 2022-07-23 MED ORDER — DILTIAZEM HCL ER COATED BEADS 300 MG PO CP24: 300.0000 mg | ORAL_CAPSULE | Freq: Every day | ORAL | 4 refills | Status: DC

## 2022-07-23 MED ORDER — APIXABAN 2.5 MG PO TABS: 2.5000 mg | ORAL_TABLET | Freq: Two times a day (BID) | ORAL | 4 refills | Status: DC

## 2022-07-24 ENCOUNTER — Encounter
Payer: Medicare Other | Attending: Physical Medicine and Rehabilitation | Admitting: Physical Medicine and Rehabilitation

## 2022-07-24 DIAGNOSIS — N186 End stage renal disease: Secondary | ICD-10-CM | POA: Diagnosis not present

## 2022-07-24 DIAGNOSIS — Z23 Encounter for immunization: Secondary | ICD-10-CM | POA: Diagnosis not present

## 2022-07-24 DIAGNOSIS — D631 Anemia in chronic kidney disease: Secondary | ICD-10-CM | POA: Diagnosis not present

## 2022-07-24 DIAGNOSIS — D509 Iron deficiency anemia, unspecified: Secondary | ICD-10-CM | POA: Diagnosis not present

## 2022-07-24 DIAGNOSIS — N2581 Secondary hyperparathyroidism of renal origin: Secondary | ICD-10-CM | POA: Diagnosis not present

## 2022-07-24 DIAGNOSIS — Z992 Dependence on renal dialysis: Secondary | ICD-10-CM | POA: Diagnosis not present

## 2022-07-26 DIAGNOSIS — N186 End stage renal disease: Secondary | ICD-10-CM | POA: Diagnosis not present

## 2022-07-26 DIAGNOSIS — N2581 Secondary hyperparathyroidism of renal origin: Secondary | ICD-10-CM | POA: Diagnosis not present

## 2022-07-26 DIAGNOSIS — Z992 Dependence on renal dialysis: Secondary | ICD-10-CM | POA: Diagnosis not present

## 2022-07-26 DIAGNOSIS — D631 Anemia in chronic kidney disease: Secondary | ICD-10-CM | POA: Diagnosis not present

## 2022-07-26 DIAGNOSIS — Z23 Encounter for immunization: Secondary | ICD-10-CM | POA: Diagnosis not present

## 2022-07-26 DIAGNOSIS — D509 Iron deficiency anemia, unspecified: Secondary | ICD-10-CM | POA: Diagnosis not present

## 2022-07-28 DIAGNOSIS — N186 End stage renal disease: Secondary | ICD-10-CM | POA: Diagnosis not present

## 2022-07-28 DIAGNOSIS — N2581 Secondary hyperparathyroidism of renal origin: Secondary | ICD-10-CM | POA: Diagnosis not present

## 2022-07-28 DIAGNOSIS — Z23 Encounter for immunization: Secondary | ICD-10-CM | POA: Diagnosis not present

## 2022-07-28 DIAGNOSIS — D509 Iron deficiency anemia, unspecified: Secondary | ICD-10-CM | POA: Diagnosis not present

## 2022-07-28 DIAGNOSIS — D631 Anemia in chronic kidney disease: Secondary | ICD-10-CM | POA: Diagnosis not present

## 2022-07-28 DIAGNOSIS — Z992 Dependence on renal dialysis: Secondary | ICD-10-CM | POA: Diagnosis not present

## 2022-07-30 DIAGNOSIS — Z992 Dependence on renal dialysis: Secondary | ICD-10-CM | POA: Diagnosis not present

## 2022-07-30 DIAGNOSIS — I129 Hypertensive chronic kidney disease with stage 1 through stage 4 chronic kidney disease, or unspecified chronic kidney disease: Secondary | ICD-10-CM | POA: Diagnosis not present

## 2022-07-30 DIAGNOSIS — N186 End stage renal disease: Secondary | ICD-10-CM | POA: Diagnosis not present

## 2022-07-31 DIAGNOSIS — D509 Iron deficiency anemia, unspecified: Secondary | ICD-10-CM | POA: Diagnosis not present

## 2022-07-31 DIAGNOSIS — D631 Anemia in chronic kidney disease: Secondary | ICD-10-CM | POA: Diagnosis not present

## 2022-07-31 DIAGNOSIS — N186 End stage renal disease: Secondary | ICD-10-CM | POA: Diagnosis not present

## 2022-07-31 DIAGNOSIS — Z992 Dependence on renal dialysis: Secondary | ICD-10-CM | POA: Diagnosis not present

## 2022-07-31 DIAGNOSIS — Z23 Encounter for immunization: Secondary | ICD-10-CM | POA: Diagnosis not present

## 2022-07-31 DIAGNOSIS — N2581 Secondary hyperparathyroidism of renal origin: Secondary | ICD-10-CM | POA: Diagnosis not present

## 2022-08-02 DIAGNOSIS — D631 Anemia in chronic kidney disease: Secondary | ICD-10-CM | POA: Diagnosis not present

## 2022-08-02 DIAGNOSIS — Z992 Dependence on renal dialysis: Secondary | ICD-10-CM | POA: Diagnosis not present

## 2022-08-02 DIAGNOSIS — N2581 Secondary hyperparathyroidism of renal origin: Secondary | ICD-10-CM | POA: Diagnosis not present

## 2022-08-02 DIAGNOSIS — N186 End stage renal disease: Secondary | ICD-10-CM | POA: Diagnosis not present

## 2022-08-02 DIAGNOSIS — D509 Iron deficiency anemia, unspecified: Secondary | ICD-10-CM | POA: Diagnosis not present

## 2022-08-02 DIAGNOSIS — Z23 Encounter for immunization: Secondary | ICD-10-CM | POA: Diagnosis not present

## 2022-08-04 DIAGNOSIS — Z23 Encounter for immunization: Secondary | ICD-10-CM | POA: Diagnosis not present

## 2022-08-04 DIAGNOSIS — Z992 Dependence on renal dialysis: Secondary | ICD-10-CM | POA: Diagnosis not present

## 2022-08-04 DIAGNOSIS — D509 Iron deficiency anemia, unspecified: Secondary | ICD-10-CM | POA: Diagnosis not present

## 2022-08-04 DIAGNOSIS — D631 Anemia in chronic kidney disease: Secondary | ICD-10-CM | POA: Diagnosis not present

## 2022-08-04 DIAGNOSIS — N2581 Secondary hyperparathyroidism of renal origin: Secondary | ICD-10-CM | POA: Diagnosis not present

## 2022-08-04 DIAGNOSIS — N186 End stage renal disease: Secondary | ICD-10-CM | POA: Diagnosis not present

## 2022-08-07 DIAGNOSIS — D631 Anemia in chronic kidney disease: Secondary | ICD-10-CM | POA: Diagnosis not present

## 2022-08-07 DIAGNOSIS — D509 Iron deficiency anemia, unspecified: Secondary | ICD-10-CM | POA: Diagnosis not present

## 2022-08-07 DIAGNOSIS — N2581 Secondary hyperparathyroidism of renal origin: Secondary | ICD-10-CM | POA: Diagnosis not present

## 2022-08-07 DIAGNOSIS — Z992 Dependence on renal dialysis: Secondary | ICD-10-CM | POA: Diagnosis not present

## 2022-08-07 DIAGNOSIS — Z23 Encounter for immunization: Secondary | ICD-10-CM | POA: Diagnosis not present

## 2022-08-07 DIAGNOSIS — N186 End stage renal disease: Secondary | ICD-10-CM | POA: Diagnosis not present

## 2022-08-09 DIAGNOSIS — N186 End stage renal disease: Secondary | ICD-10-CM | POA: Diagnosis not present

## 2022-08-09 DIAGNOSIS — D509 Iron deficiency anemia, unspecified: Secondary | ICD-10-CM | POA: Diagnosis not present

## 2022-08-09 DIAGNOSIS — Z23 Encounter for immunization: Secondary | ICD-10-CM | POA: Diagnosis not present

## 2022-08-09 DIAGNOSIS — Z992 Dependence on renal dialysis: Secondary | ICD-10-CM | POA: Diagnosis not present

## 2022-08-09 DIAGNOSIS — D631 Anemia in chronic kidney disease: Secondary | ICD-10-CM | POA: Diagnosis not present

## 2022-08-09 DIAGNOSIS — N2581 Secondary hyperparathyroidism of renal origin: Secondary | ICD-10-CM | POA: Diagnosis not present

## 2022-08-11 DIAGNOSIS — N186 End stage renal disease: Secondary | ICD-10-CM | POA: Diagnosis not present

## 2022-08-11 DIAGNOSIS — D509 Iron deficiency anemia, unspecified: Secondary | ICD-10-CM | POA: Diagnosis not present

## 2022-08-11 DIAGNOSIS — D631 Anemia in chronic kidney disease: Secondary | ICD-10-CM | POA: Diagnosis not present

## 2022-08-11 DIAGNOSIS — N2581 Secondary hyperparathyroidism of renal origin: Secondary | ICD-10-CM | POA: Diagnosis not present

## 2022-08-11 DIAGNOSIS — Z992 Dependence on renal dialysis: Secondary | ICD-10-CM | POA: Diagnosis not present

## 2022-08-11 DIAGNOSIS — Z23 Encounter for immunization: Secondary | ICD-10-CM | POA: Diagnosis not present

## 2022-08-14 DIAGNOSIS — Z992 Dependence on renal dialysis: Secondary | ICD-10-CM | POA: Diagnosis not present

## 2022-08-14 DIAGNOSIS — D631 Anemia in chronic kidney disease: Secondary | ICD-10-CM | POA: Diagnosis not present

## 2022-08-14 DIAGNOSIS — Z23 Encounter for immunization: Secondary | ICD-10-CM | POA: Diagnosis not present

## 2022-08-14 DIAGNOSIS — D509 Iron deficiency anemia, unspecified: Secondary | ICD-10-CM | POA: Diagnosis not present

## 2022-08-14 DIAGNOSIS — N2581 Secondary hyperparathyroidism of renal origin: Secondary | ICD-10-CM | POA: Diagnosis not present

## 2022-08-14 DIAGNOSIS — N186 End stage renal disease: Secondary | ICD-10-CM | POA: Diagnosis not present

## 2022-08-16 DIAGNOSIS — N186 End stage renal disease: Secondary | ICD-10-CM | POA: Diagnosis not present

## 2022-08-16 DIAGNOSIS — Z992 Dependence on renal dialysis: Secondary | ICD-10-CM | POA: Diagnosis not present

## 2022-08-16 DIAGNOSIS — N2581 Secondary hyperparathyroidism of renal origin: Secondary | ICD-10-CM | POA: Diagnosis not present

## 2022-08-16 DIAGNOSIS — D631 Anemia in chronic kidney disease: Secondary | ICD-10-CM | POA: Diagnosis not present

## 2022-08-16 DIAGNOSIS — Z23 Encounter for immunization: Secondary | ICD-10-CM | POA: Diagnosis not present

## 2022-08-16 DIAGNOSIS — D509 Iron deficiency anemia, unspecified: Secondary | ICD-10-CM | POA: Diagnosis not present

## 2022-08-18 DIAGNOSIS — D509 Iron deficiency anemia, unspecified: Secondary | ICD-10-CM | POA: Diagnosis not present

## 2022-08-18 DIAGNOSIS — N2581 Secondary hyperparathyroidism of renal origin: Secondary | ICD-10-CM | POA: Diagnosis not present

## 2022-08-18 DIAGNOSIS — D631 Anemia in chronic kidney disease: Secondary | ICD-10-CM | POA: Diagnosis not present

## 2022-08-18 DIAGNOSIS — N186 End stage renal disease: Secondary | ICD-10-CM | POA: Diagnosis not present

## 2022-08-18 DIAGNOSIS — Z992 Dependence on renal dialysis: Secondary | ICD-10-CM | POA: Diagnosis not present

## 2022-08-18 DIAGNOSIS — Z23 Encounter for immunization: Secondary | ICD-10-CM | POA: Diagnosis not present

## 2022-08-21 DIAGNOSIS — Z992 Dependence on renal dialysis: Secondary | ICD-10-CM | POA: Diagnosis not present

## 2022-08-21 DIAGNOSIS — D631 Anemia in chronic kidney disease: Secondary | ICD-10-CM | POA: Diagnosis not present

## 2022-08-21 DIAGNOSIS — Z23 Encounter for immunization: Secondary | ICD-10-CM | POA: Diagnosis not present

## 2022-08-21 DIAGNOSIS — N186 End stage renal disease: Secondary | ICD-10-CM | POA: Diagnosis not present

## 2022-08-21 DIAGNOSIS — N2581 Secondary hyperparathyroidism of renal origin: Secondary | ICD-10-CM | POA: Diagnosis not present

## 2022-08-21 DIAGNOSIS — D509 Iron deficiency anemia, unspecified: Secondary | ICD-10-CM | POA: Diagnosis not present

## 2022-08-23 DIAGNOSIS — D631 Anemia in chronic kidney disease: Secondary | ICD-10-CM | POA: Diagnosis not present

## 2022-08-23 DIAGNOSIS — N186 End stage renal disease: Secondary | ICD-10-CM | POA: Diagnosis not present

## 2022-08-23 DIAGNOSIS — Z992 Dependence on renal dialysis: Secondary | ICD-10-CM | POA: Diagnosis not present

## 2022-08-23 DIAGNOSIS — Z23 Encounter for immunization: Secondary | ICD-10-CM | POA: Diagnosis not present

## 2022-08-23 DIAGNOSIS — D509 Iron deficiency anemia, unspecified: Secondary | ICD-10-CM | POA: Diagnosis not present

## 2022-08-23 DIAGNOSIS — N2581 Secondary hyperparathyroidism of renal origin: Secondary | ICD-10-CM | POA: Diagnosis not present

## 2022-08-25 DIAGNOSIS — D631 Anemia in chronic kidney disease: Secondary | ICD-10-CM | POA: Diagnosis not present

## 2022-08-25 DIAGNOSIS — D509 Iron deficiency anemia, unspecified: Secondary | ICD-10-CM | POA: Diagnosis not present

## 2022-08-25 DIAGNOSIS — N186 End stage renal disease: Secondary | ICD-10-CM | POA: Diagnosis not present

## 2022-08-25 DIAGNOSIS — Z23 Encounter for immunization: Secondary | ICD-10-CM | POA: Diagnosis not present

## 2022-08-25 DIAGNOSIS — Z992 Dependence on renal dialysis: Secondary | ICD-10-CM | POA: Diagnosis not present

## 2022-08-25 DIAGNOSIS — N2581 Secondary hyperparathyroidism of renal origin: Secondary | ICD-10-CM | POA: Diagnosis not present

## 2022-08-28 DIAGNOSIS — N2581 Secondary hyperparathyroidism of renal origin: Secondary | ICD-10-CM | POA: Diagnosis not present

## 2022-08-28 DIAGNOSIS — Z23 Encounter for immunization: Secondary | ICD-10-CM | POA: Diagnosis not present

## 2022-08-28 DIAGNOSIS — D509 Iron deficiency anemia, unspecified: Secondary | ICD-10-CM | POA: Diagnosis not present

## 2022-08-28 DIAGNOSIS — Z992 Dependence on renal dialysis: Secondary | ICD-10-CM | POA: Diagnosis not present

## 2022-08-28 DIAGNOSIS — D631 Anemia in chronic kidney disease: Secondary | ICD-10-CM | POA: Diagnosis not present

## 2022-08-28 DIAGNOSIS — N186 End stage renal disease: Secondary | ICD-10-CM | POA: Diagnosis not present

## 2022-08-30 DIAGNOSIS — Z992 Dependence on renal dialysis: Secondary | ICD-10-CM | POA: Diagnosis not present

## 2022-08-30 DIAGNOSIS — I129 Hypertensive chronic kidney disease with stage 1 through stage 4 chronic kidney disease, or unspecified chronic kidney disease: Secondary | ICD-10-CM | POA: Diagnosis not present

## 2022-08-30 DIAGNOSIS — D631 Anemia in chronic kidney disease: Secondary | ICD-10-CM | POA: Diagnosis not present

## 2022-08-30 DIAGNOSIS — N2581 Secondary hyperparathyroidism of renal origin: Secondary | ICD-10-CM | POA: Diagnosis not present

## 2022-08-30 DIAGNOSIS — D509 Iron deficiency anemia, unspecified: Secondary | ICD-10-CM | POA: Diagnosis not present

## 2022-08-30 DIAGNOSIS — N186 End stage renal disease: Secondary | ICD-10-CM | POA: Diagnosis not present

## 2022-08-30 DIAGNOSIS — Z23 Encounter for immunization: Secondary | ICD-10-CM | POA: Diagnosis not present

## 2022-09-01 DIAGNOSIS — N2581 Secondary hyperparathyroidism of renal origin: Secondary | ICD-10-CM | POA: Diagnosis not present

## 2022-09-01 DIAGNOSIS — N186 End stage renal disease: Secondary | ICD-10-CM | POA: Diagnosis not present

## 2022-09-01 DIAGNOSIS — Z992 Dependence on renal dialysis: Secondary | ICD-10-CM | POA: Diagnosis not present

## 2022-09-01 DIAGNOSIS — Z23 Encounter for immunization: Secondary | ICD-10-CM | POA: Diagnosis not present

## 2022-09-01 DIAGNOSIS — D509 Iron deficiency anemia, unspecified: Secondary | ICD-10-CM | POA: Diagnosis not present

## 2022-09-01 DIAGNOSIS — D631 Anemia in chronic kidney disease: Secondary | ICD-10-CM | POA: Diagnosis not present

## 2022-09-04 DIAGNOSIS — N186 End stage renal disease: Secondary | ICD-10-CM | POA: Diagnosis not present

## 2022-09-04 DIAGNOSIS — N2581 Secondary hyperparathyroidism of renal origin: Secondary | ICD-10-CM | POA: Diagnosis not present

## 2022-09-04 DIAGNOSIS — D509 Iron deficiency anemia, unspecified: Secondary | ICD-10-CM | POA: Diagnosis not present

## 2022-09-04 DIAGNOSIS — D631 Anemia in chronic kidney disease: Secondary | ICD-10-CM | POA: Diagnosis not present

## 2022-09-04 DIAGNOSIS — Z23 Encounter for immunization: Secondary | ICD-10-CM | POA: Diagnosis not present

## 2022-09-04 DIAGNOSIS — Z992 Dependence on renal dialysis: Secondary | ICD-10-CM | POA: Diagnosis not present

## 2022-09-06 DIAGNOSIS — N186 End stage renal disease: Secondary | ICD-10-CM | POA: Diagnosis not present

## 2022-09-06 DIAGNOSIS — D509 Iron deficiency anemia, unspecified: Secondary | ICD-10-CM | POA: Diagnosis not present

## 2022-09-06 DIAGNOSIS — Z23 Encounter for immunization: Secondary | ICD-10-CM | POA: Diagnosis not present

## 2022-09-06 DIAGNOSIS — N2581 Secondary hyperparathyroidism of renal origin: Secondary | ICD-10-CM | POA: Diagnosis not present

## 2022-09-06 DIAGNOSIS — Z992 Dependence on renal dialysis: Secondary | ICD-10-CM | POA: Diagnosis not present

## 2022-09-06 DIAGNOSIS — D631 Anemia in chronic kidney disease: Secondary | ICD-10-CM | POA: Diagnosis not present

## 2022-09-08 DIAGNOSIS — N2581 Secondary hyperparathyroidism of renal origin: Secondary | ICD-10-CM | POA: Diagnosis not present

## 2022-09-08 DIAGNOSIS — D509 Iron deficiency anemia, unspecified: Secondary | ICD-10-CM | POA: Diagnosis not present

## 2022-09-08 DIAGNOSIS — Z23 Encounter for immunization: Secondary | ICD-10-CM | POA: Diagnosis not present

## 2022-09-08 DIAGNOSIS — D631 Anemia in chronic kidney disease: Secondary | ICD-10-CM | POA: Diagnosis not present

## 2022-09-08 DIAGNOSIS — N186 End stage renal disease: Secondary | ICD-10-CM | POA: Diagnosis not present

## 2022-09-08 DIAGNOSIS — Z992 Dependence on renal dialysis: Secondary | ICD-10-CM | POA: Diagnosis not present

## 2022-09-10 ENCOUNTER — Other Ambulatory Visit (HOSPITAL_COMMUNITY): Payer: Self-pay

## 2022-09-10 MED ORDER — APIXABAN 2.5 MG PO TABS: 2.5000 mg | ORAL_TABLET | Freq: Two times a day (BID) | ORAL | 1 refills | Status: DC

## 2022-09-10 MED ORDER — AMIODARONE HCL 200 MG PO TABS: 200.0000 mg | ORAL_TABLET | Freq: Every day | ORAL | 0 refills | Status: DC

## 2022-09-11 DIAGNOSIS — D509 Iron deficiency anemia, unspecified: Secondary | ICD-10-CM | POA: Diagnosis not present

## 2022-09-11 DIAGNOSIS — Z23 Encounter for immunization: Secondary | ICD-10-CM | POA: Diagnosis not present

## 2022-09-11 DIAGNOSIS — Z992 Dependence on renal dialysis: Secondary | ICD-10-CM | POA: Diagnosis not present

## 2022-09-11 DIAGNOSIS — N2581 Secondary hyperparathyroidism of renal origin: Secondary | ICD-10-CM | POA: Diagnosis not present

## 2022-09-11 DIAGNOSIS — D631 Anemia in chronic kidney disease: Secondary | ICD-10-CM | POA: Diagnosis not present

## 2022-09-11 DIAGNOSIS — N186 End stage renal disease: Secondary | ICD-10-CM | POA: Diagnosis not present

## 2022-09-11 DIAGNOSIS — Z1152 Encounter for screening for COVID-19: Secondary | ICD-10-CM | POA: Diagnosis not present

## 2022-09-13 DIAGNOSIS — U071 COVID-19: Secondary | ICD-10-CM | POA: Diagnosis not present

## 2022-09-13 DIAGNOSIS — N2581 Secondary hyperparathyroidism of renal origin: Secondary | ICD-10-CM | POA: Diagnosis not present

## 2022-09-13 DIAGNOSIS — Z992 Dependence on renal dialysis: Secondary | ICD-10-CM | POA: Diagnosis not present

## 2022-09-13 DIAGNOSIS — D631 Anemia in chronic kidney disease: Secondary | ICD-10-CM | POA: Diagnosis not present

## 2022-09-13 DIAGNOSIS — Z23 Encounter for immunization: Secondary | ICD-10-CM | POA: Diagnosis not present

## 2022-09-13 DIAGNOSIS — N186 End stage renal disease: Secondary | ICD-10-CM | POA: Diagnosis not present

## 2022-09-13 DIAGNOSIS — D509 Iron deficiency anemia, unspecified: Secondary | ICD-10-CM | POA: Diagnosis not present

## 2022-09-15 DIAGNOSIS — Z23 Encounter for immunization: Secondary | ICD-10-CM | POA: Diagnosis not present

## 2022-09-15 DIAGNOSIS — N2581 Secondary hyperparathyroidism of renal origin: Secondary | ICD-10-CM | POA: Diagnosis not present

## 2022-09-15 DIAGNOSIS — D509 Iron deficiency anemia, unspecified: Secondary | ICD-10-CM | POA: Diagnosis not present

## 2022-09-15 DIAGNOSIS — D631 Anemia in chronic kidney disease: Secondary | ICD-10-CM | POA: Diagnosis not present

## 2022-09-15 DIAGNOSIS — Z992 Dependence on renal dialysis: Secondary | ICD-10-CM | POA: Diagnosis not present

## 2022-09-15 DIAGNOSIS — N186 End stage renal disease: Secondary | ICD-10-CM | POA: Diagnosis not present

## 2022-09-18 DIAGNOSIS — Z23 Encounter for immunization: Secondary | ICD-10-CM | POA: Diagnosis not present

## 2022-09-18 DIAGNOSIS — Z992 Dependence on renal dialysis: Secondary | ICD-10-CM | POA: Diagnosis not present

## 2022-09-18 DIAGNOSIS — D509 Iron deficiency anemia, unspecified: Secondary | ICD-10-CM | POA: Diagnosis not present

## 2022-09-18 DIAGNOSIS — D631 Anemia in chronic kidney disease: Secondary | ICD-10-CM | POA: Diagnosis not present

## 2022-09-18 DIAGNOSIS — N186 End stage renal disease: Secondary | ICD-10-CM | POA: Diagnosis not present

## 2022-09-18 DIAGNOSIS — N2581 Secondary hyperparathyroidism of renal origin: Secondary | ICD-10-CM | POA: Diagnosis not present

## 2022-09-20 DIAGNOSIS — N2581 Secondary hyperparathyroidism of renal origin: Secondary | ICD-10-CM | POA: Diagnosis not present

## 2022-09-20 DIAGNOSIS — Z992 Dependence on renal dialysis: Secondary | ICD-10-CM | POA: Diagnosis not present

## 2022-09-20 DIAGNOSIS — Z23 Encounter for immunization: Secondary | ICD-10-CM | POA: Diagnosis not present

## 2022-09-20 DIAGNOSIS — E039 Hypothyroidism, unspecified: Secondary | ICD-10-CM | POA: Diagnosis not present

## 2022-09-20 DIAGNOSIS — N186 End stage renal disease: Secondary | ICD-10-CM | POA: Diagnosis not present

## 2022-09-20 DIAGNOSIS — D509 Iron deficiency anemia, unspecified: Secondary | ICD-10-CM | POA: Diagnosis not present

## 2022-09-20 DIAGNOSIS — D631 Anemia in chronic kidney disease: Secondary | ICD-10-CM | POA: Diagnosis not present

## 2022-09-22 DIAGNOSIS — D631 Anemia in chronic kidney disease: Secondary | ICD-10-CM | POA: Diagnosis not present

## 2022-09-22 DIAGNOSIS — Z992 Dependence on renal dialysis: Secondary | ICD-10-CM | POA: Diagnosis not present

## 2022-09-22 DIAGNOSIS — N2581 Secondary hyperparathyroidism of renal origin: Secondary | ICD-10-CM | POA: Diagnosis not present

## 2022-09-22 DIAGNOSIS — D509 Iron deficiency anemia, unspecified: Secondary | ICD-10-CM | POA: Diagnosis not present

## 2022-09-22 DIAGNOSIS — N186 End stage renal disease: Secondary | ICD-10-CM | POA: Diagnosis not present

## 2022-09-22 DIAGNOSIS — Z23 Encounter for immunization: Secondary | ICD-10-CM | POA: Diagnosis not present

## 2022-09-25 DIAGNOSIS — N186 End stage renal disease: Secondary | ICD-10-CM | POA: Diagnosis not present

## 2022-09-25 DIAGNOSIS — N2581 Secondary hyperparathyroidism of renal origin: Secondary | ICD-10-CM | POA: Diagnosis not present

## 2022-09-25 DIAGNOSIS — Z23 Encounter for immunization: Secondary | ICD-10-CM | POA: Diagnosis not present

## 2022-09-25 DIAGNOSIS — D509 Iron deficiency anemia, unspecified: Secondary | ICD-10-CM | POA: Diagnosis not present

## 2022-09-25 DIAGNOSIS — Z992 Dependence on renal dialysis: Secondary | ICD-10-CM | POA: Diagnosis not present

## 2022-09-25 DIAGNOSIS — D631 Anemia in chronic kidney disease: Secondary | ICD-10-CM | POA: Diagnosis not present

## 2022-09-26 ENCOUNTER — Ambulatory Visit (HOSPITAL_COMMUNITY)
Admission: RE | Admit: 2022-09-26 | Discharge: 2022-09-26 | Disposition: A | Discharge status: Home / Self Care | Payer: Medicare Other | Source: Ambulatory Visit | Attending: Physician Assistant | Admitting: Physician Assistant

## 2022-09-26 ENCOUNTER — Encounter (HOSPITAL_COMMUNITY): Payer: Self-pay | Admitting: Physician Assistant

## 2022-09-26 VITALS — BP 132/60 | HR 87 | Ht 72.0 in | Wt 160.0 lb

## 2022-09-26 DIAGNOSIS — I4819 Other persistent atrial fibrillation: Secondary | ICD-10-CM | POA: Diagnosis not present

## 2022-09-26 DIAGNOSIS — D6869 Other thrombophilia: Secondary | ICD-10-CM | POA: Diagnosis not present

## 2022-09-26 DIAGNOSIS — Z5181 Encounter for therapeutic drug level monitoring: Secondary | ICD-10-CM

## 2022-09-26 DIAGNOSIS — Z79899 Other long term (current) drug therapy: Secondary | ICD-10-CM | POA: Diagnosis not present

## 2022-09-26 DIAGNOSIS — Z992 Dependence on renal dialysis: Secondary | ICD-10-CM | POA: Insufficient documentation

## 2022-09-26 DIAGNOSIS — I4892 Unspecified atrial flutter: Secondary | ICD-10-CM | POA: Diagnosis present

## 2022-09-26 DIAGNOSIS — I4891 Unspecified atrial fibrillation: Secondary | ICD-10-CM | POA: Diagnosis present

## 2022-09-26 DIAGNOSIS — N186 End stage renal disease: Secondary | ICD-10-CM | POA: Diagnosis not present

## 2022-09-26 DIAGNOSIS — I483 Typical atrial flutter: Secondary | ICD-10-CM | POA: Insufficient documentation

## 2022-09-26 DIAGNOSIS — R9431 Abnormal electrocardiogram [ECG] [EKG]: Secondary | ICD-10-CM | POA: Insufficient documentation

## 2022-09-26 LAB — COMPREHENSIVE METABOLIC PANEL
ALT: 13 U/L (ref 0–44)
AST: 12 U/L — ABNORMAL LOW (ref 15–41)
Albumin: 3.2 g/dL — ABNORMAL LOW (ref 3.5–5.0)
Alkaline Phosphatase: 44 U/L (ref 38–126)
Anion gap: 16 — ABNORMAL HIGH (ref 5–15)
BUN: 37 mg/dL — ABNORMAL HIGH (ref 8–23)
CO2: 27 mmol/L (ref 22–32)
Calcium: 8.1 mg/dL — ABNORMAL LOW (ref 8.9–10.3)
Chloride: 91 mmol/L — ABNORMAL LOW (ref 98–111)
Creatinine, Ser: 6.18 mg/dL — ABNORMAL HIGH (ref 0.61–1.24)
GFR, Estimated: 8 mL/min — ABNORMAL LOW (ref >60)
Glucose, Bld: 115 mg/dL — ABNORMAL HIGH (ref 70–99)
Potassium: 3.8 mmol/L (ref 3.5–5.1)
Sodium: 134 mmol/L — ABNORMAL LOW (ref 135–145)
Total Bilirubin: 0.6 mg/dL (ref 0.3–1.2)
Total Protein: 6.9 g/dL (ref 6.5–8.1)

## 2022-09-26 LAB — TSH: TSH: 4.774 u[IU]/mL — ABNORMAL HIGH (ref 0.350–4.500)

## 2022-09-26 NOTE — Progress Notes (Signed)
Primary Care Physician: Benita Stabile, MD Primary Cardiologist: Dr Eldridge Dace  Primary Electrophysiologist: Dr Nelly Laurence Referring Physician: Dr Hartley Barefoot is a 82 y.o. male with a history of ESRD, anemia, atrial flutter, atrial fibrillation who presents for follow up in the [facility]. Patient initially presented to the emergency room on 05/30/2022 due to complaints of headache, n/v, poor oral intake and chest pain x 1 week.  He was admitted for uremia and metabolic acidosis secondary to stopping dialysis over a year ago and cessation of taking all of his medications. During admission, patient was found to be in A-fib RVR. Per telemetry/EKG review, he was in atrial flutter with RVR upon admission, briefly converted to NSR on 5/5, before returning to atrial flutter then developing atrial fib overnight, rates 120s-150s. He has had several electrolyte derangements. Hgb was 6.7. Rate control was attempted with diltiazem but he continued to have issues with elevated rates and he underwent TEE guided DCCV on 06/19/22. Patient is on Eliquis for a CHADS2VASC score of 2.  Patient seen at the ED 07/11/22 for an issue with his suprapubic catheter being embedded in bladder wall. He was also in atrial flutter with elevated rates and underwent DCCV.   On follow up today, patient reports that he has done well since his last visit. He ran out of diltiazem a few weeks ago but he has remained in SR. No bleeding issues on anticoagulation.   Today, he denies symptoms of palpitations, chest pain, shortness of breath, orthopnea, PND, lower extremity edema, dizziness, presyncope, syncope, snoring, daytime somnolence, bleeding, or neurologic sequela. The patient is tolerating medications without difficulties and is otherwise without complaint today.    Atrial Fibrillation Risk Factors:  he does not have symptoms or diagnosis of sleep apnea. he does not have a history of rheumatic  fever.   Atrial Fibrillation Management history:  Previous antiarrhythmic drugs: amiodarone  Previous cardioversions: 06/19/22, 07/12/22 Previous ablations: none Anticoagulation history: Eliquis   Past Medical History:  Diagnosis Date   Anemia    Chronic kidney disease    Dysrhythmia    GERD (gastroesophageal reflux disease)    Neurogenic bladder     Current Outpatient Medications  Medication Sig Dispense Refill   amiodarone (PACERONE) 200 MG tablet Take 1 tablet (200 mg total) by mouth daily. 90 tablet 0   apixaban (ELIQUIS) 2.5 MG TABS tablet Take 1 tablet (2.5 mg total) by mouth 2 (two) times daily. 180 tablet 1   calcitRIOL (ROCALTROL) 0.5 MCG capsule Take 1 capsule (0.5 mcg total) by mouth daily. 30 capsule 0   calcium acetate (PHOSLO) 667 MG capsule Take 1 capsule (667 mg total) by mouth 3 (three) times daily with meals. 90 capsule 0   diltiazem (CARDIZEM CD) 300 MG 24 hr capsule Take 1 capsule (300 mg total) by mouth daily. 30 capsule 4   loratadine (CLARITIN) 10 MG tablet Take 1 tablet (10 mg total) by mouth daily. 30 tablet 0   melatonin 3 MG TABS tablet Take 1 tablet (3 mg total) by mouth at bedtime as needed. 30 tablet 0   Nutritional Supplements (FEEDING SUPPLEMENT, NEPRO CARB STEADY,) LIQD Take 237 mLs by mouth 2 (two) times daily between meals.  0   vitamin D3 (CHOLECALCIFEROL) 25 MCG tablet Take 1 tablet (1,000 Units total) by mouth daily. 30 tablet 0   No current facility-administered medications for this encounter.    ROS- All systems are reviewed and negative  except as per the HPI above.  Physical Exam: Vitals:   09/26/22 1424  BP: 132/60  Pulse: 87  Weight: 72.6 kg  Height: 6' (1.829 m)    GEN: Well nourished, well developed in no acute distress NECK: No JVD; No carotid bruits CARDIAC: Regular rate and rhythm, no murmurs, rubs, gallops RESPIRATORY:  Clear to auscultation without rales, wheezing or rhonchi  ABDOMEN: Soft, non-tender,  non-distended EXTREMITIES:  No edema; No deformity    Wt Readings from Last 3 Encounters:  09/26/22 72.6 kg  07/11/22 71.1 kg  07/04/22 71.1 kg    EKG today demonstrates  SR, RBBB, 1st degree AV block Vent. rate 87 BPM PR interval 200 ms QRS duration 152 ms QT/QTcB 476/572 ms  Echo 06/05/22 demonstrated  1. Left ventricular ejection fraction, by estimation, is 55 to 60%. The  left ventricle has normal function. The left ventricle has no regional  wall motion abnormalities. There is mild concentric left ventricular  hypertrophy. Left ventricular diastolic function could not be evaluated.   2. Right ventricular systolic function is normal. The right ventricular  size is normal. There is mildly elevated pulmonary artery systolic  pressure. The estimated right ventricular systolic pressure is 44.2 mmHg.   3. Left atrial size was moderately dilated.   4. Right atrial size was mildly dilated.   5. The mitral valve is grossly normal. Trivial mitral valve  regurgitation. No evidence of mitral stenosis.   6. The aortic valve is tricuspid. Aortic valve regurgitation is not  visualized. No aortic stenosis is present.   7. The inferior vena cava is dilated in size with <50% respiratory  variability, suggesting right atrial pressure of 15 mmHg.   Epic records are reviewed at length today.  CHA2DS2-VASc Score = 2  The patient's score is based upon: CHF History: 0 HTN History: 0 Diabetes History: 0 Stroke History: 0 Vascular Disease History: 0 Age Score: 2 Gender Score: 0       ASSESSMENT AND PLAN: Persistent Atrial Fibrillation/atrial flutter The patient's CHA2DS2-VASc score is 2, indicating a 2.2% annual risk of stroke.   Patient appears to be maintaining SR Continue amiodarone 200 mg daily Check cmet/TSH today  Continue Eliquis 2.5 mg BID (ESRD, age >29)  Secondary Hypercoagulable State (ICD10:  D68.69) The patient is at significant risk for stroke/thromboembolism based  upon his CHA2DS2-VASc Score of 2.  Continue Apixaban (Eliquis).   ESRD HD on Tu/Th/Sat   Follow up in the AF clinic in 6 months.    Jorja Loa PA-C [facility] [address] [place], [place] [address] [phone] 09/26/2022 2:32 PM

## 2022-09-26 NOTE — Addendum Note (Signed)
Encounter addended by: Shona Simpson, RN on: 09/26/2022 4:39 PM  Actions taken: Order list changed, Diagnosis association updated

## 2022-09-27 DIAGNOSIS — Z23 Encounter for immunization: Secondary | ICD-10-CM | POA: Diagnosis not present

## 2022-09-27 DIAGNOSIS — D509 Iron deficiency anemia, unspecified: Secondary | ICD-10-CM | POA: Diagnosis not present

## 2022-09-27 DIAGNOSIS — N186 End stage renal disease: Secondary | ICD-10-CM | POA: Diagnosis not present

## 2022-09-27 DIAGNOSIS — N2581 Secondary hyperparathyroidism of renal origin: Secondary | ICD-10-CM | POA: Diagnosis not present

## 2022-09-27 DIAGNOSIS — D631 Anemia in chronic kidney disease: Secondary | ICD-10-CM | POA: Diagnosis not present

## 2022-09-27 DIAGNOSIS — Z992 Dependence on renal dialysis: Secondary | ICD-10-CM | POA: Diagnosis not present

## 2022-09-27 LAB — T4, FREE: Free T4: 1.04 ng/dL (ref 0.61–1.12)

## 2022-09-28 LAB — T3: T3, Total: 70 ng/dL — ABNORMAL LOW (ref 71–180)

## 2022-09-29 DIAGNOSIS — N2581 Secondary hyperparathyroidism of renal origin: Secondary | ICD-10-CM | POA: Diagnosis not present

## 2022-09-29 DIAGNOSIS — Z23 Encounter for immunization: Secondary | ICD-10-CM | POA: Diagnosis not present

## 2022-09-29 DIAGNOSIS — Z992 Dependence on renal dialysis: Secondary | ICD-10-CM | POA: Diagnosis not present

## 2022-09-29 DIAGNOSIS — D631 Anemia in chronic kidney disease: Secondary | ICD-10-CM | POA: Diagnosis not present

## 2022-09-29 DIAGNOSIS — N186 End stage renal disease: Secondary | ICD-10-CM | POA: Diagnosis not present

## 2022-09-29 DIAGNOSIS — D509 Iron deficiency anemia, unspecified: Secondary | ICD-10-CM | POA: Diagnosis not present

## 2022-09-30 DIAGNOSIS — Z992 Dependence on renal dialysis: Secondary | ICD-10-CM | POA: Diagnosis not present

## 2022-09-30 DIAGNOSIS — N186 End stage renal disease: Secondary | ICD-10-CM | POA: Diagnosis not present

## 2022-09-30 DIAGNOSIS — I129 Hypertensive chronic kidney disease with stage 1 through stage 4 chronic kidney disease, or unspecified chronic kidney disease: Secondary | ICD-10-CM | POA: Diagnosis not present

## 2022-10-03 DIAGNOSIS — I871 Compression of vein: Secondary | ICD-10-CM | POA: Diagnosis not present

## 2022-10-03 DIAGNOSIS — T82868A Thrombosis of vascular prosthetic devices, implants and grafts, initial encounter: Secondary | ICD-10-CM | POA: Diagnosis not present

## 2022-10-03 DIAGNOSIS — N186 End stage renal disease: Secondary | ICD-10-CM | POA: Diagnosis not present

## 2022-10-03 DIAGNOSIS — Z992 Dependence on renal dialysis: Secondary | ICD-10-CM | POA: Diagnosis not present

## 2022-10-04 ENCOUNTER — Other Ambulatory Visit (HOSPITAL_COMMUNITY)
Admission: RE | Admit: 2022-10-04 | Discharge: 2022-10-04 | Disposition: A | Discharge status: Home / Self Care | Payer: Medicare Other | Source: Other Acute Inpatient Hospital | Attending: Nephrology | Admitting: Nephrology

## 2022-10-04 DIAGNOSIS — D509 Iron deficiency anemia, unspecified: Secondary | ICD-10-CM | POA: Diagnosis not present

## 2022-10-04 DIAGNOSIS — N2581 Secondary hyperparathyroidism of renal origin: Secondary | ICD-10-CM | POA: Diagnosis not present

## 2022-10-04 DIAGNOSIS — E875 Hyperkalemia: Secondary | ICD-10-CM | POA: Diagnosis not present

## 2022-10-04 DIAGNOSIS — N186 End stage renal disease: Secondary | ICD-10-CM | POA: Diagnosis not present

## 2022-10-04 DIAGNOSIS — D631 Anemia in chronic kidney disease: Secondary | ICD-10-CM | POA: Diagnosis not present

## 2022-10-04 DIAGNOSIS — Z992 Dependence on renal dialysis: Secondary | ICD-10-CM | POA: Diagnosis not present

## 2022-10-04 LAB — POTASSIUM: Potassium: 5.3 mmol/L — ABNORMAL HIGH (ref 3.5–5.1)

## 2022-10-05 DIAGNOSIS — D509 Iron deficiency anemia, unspecified: Secondary | ICD-10-CM | POA: Diagnosis not present

## 2022-10-05 DIAGNOSIS — N2581 Secondary hyperparathyroidism of renal origin: Secondary | ICD-10-CM | POA: Diagnosis not present

## 2022-10-05 DIAGNOSIS — T82868A Thrombosis of vascular prosthetic devices, implants and grafts, initial encounter: Secondary | ICD-10-CM | POA: Diagnosis not present

## 2022-10-05 DIAGNOSIS — N186 End stage renal disease: Secondary | ICD-10-CM | POA: Diagnosis not present

## 2022-10-05 DIAGNOSIS — Z992 Dependence on renal dialysis: Secondary | ICD-10-CM | POA: Diagnosis not present

## 2022-10-05 DIAGNOSIS — D631 Anemia in chronic kidney disease: Secondary | ICD-10-CM | POA: Diagnosis not present

## 2022-10-06 DIAGNOSIS — N2581 Secondary hyperparathyroidism of renal origin: Secondary | ICD-10-CM | POA: Diagnosis not present

## 2022-10-06 DIAGNOSIS — D631 Anemia in chronic kidney disease: Secondary | ICD-10-CM | POA: Diagnosis not present

## 2022-10-06 DIAGNOSIS — D509 Iron deficiency anemia, unspecified: Secondary | ICD-10-CM | POA: Diagnosis not present

## 2022-10-06 DIAGNOSIS — N186 End stage renal disease: Secondary | ICD-10-CM | POA: Diagnosis not present

## 2022-10-06 DIAGNOSIS — Z992 Dependence on renal dialysis: Secondary | ICD-10-CM | POA: Diagnosis not present

## 2022-10-09 DIAGNOSIS — N2581 Secondary hyperparathyroidism of renal origin: Secondary | ICD-10-CM | POA: Diagnosis not present

## 2022-10-09 DIAGNOSIS — Z992 Dependence on renal dialysis: Secondary | ICD-10-CM | POA: Diagnosis not present

## 2022-10-09 DIAGNOSIS — D509 Iron deficiency anemia, unspecified: Secondary | ICD-10-CM | POA: Diagnosis not present

## 2022-10-09 DIAGNOSIS — D631 Anemia in chronic kidney disease: Secondary | ICD-10-CM | POA: Diagnosis not present

## 2022-10-09 DIAGNOSIS — N186 End stage renal disease: Secondary | ICD-10-CM | POA: Diagnosis not present

## 2022-10-11 DIAGNOSIS — N186 End stage renal disease: Secondary | ICD-10-CM | POA: Diagnosis not present

## 2022-10-11 DIAGNOSIS — N2581 Secondary hyperparathyroidism of renal origin: Secondary | ICD-10-CM | POA: Diagnosis not present

## 2022-10-11 DIAGNOSIS — Z992 Dependence on renal dialysis: Secondary | ICD-10-CM | POA: Diagnosis not present

## 2022-10-11 DIAGNOSIS — D631 Anemia in chronic kidney disease: Secondary | ICD-10-CM | POA: Diagnosis not present

## 2022-10-11 DIAGNOSIS — D509 Iron deficiency anemia, unspecified: Secondary | ICD-10-CM | POA: Diagnosis not present

## 2022-10-13 DIAGNOSIS — N186 End stage renal disease: Secondary | ICD-10-CM | POA: Diagnosis not present

## 2022-10-13 DIAGNOSIS — Z992 Dependence on renal dialysis: Secondary | ICD-10-CM | POA: Diagnosis not present

## 2022-10-13 DIAGNOSIS — D631 Anemia in chronic kidney disease: Secondary | ICD-10-CM | POA: Diagnosis not present

## 2022-10-13 DIAGNOSIS — N2581 Secondary hyperparathyroidism of renal origin: Secondary | ICD-10-CM | POA: Diagnosis not present

## 2022-10-13 DIAGNOSIS — D509 Iron deficiency anemia, unspecified: Secondary | ICD-10-CM | POA: Diagnosis not present

## 2022-10-16 DIAGNOSIS — N2581 Secondary hyperparathyroidism of renal origin: Secondary | ICD-10-CM | POA: Diagnosis not present

## 2022-10-16 DIAGNOSIS — D631 Anemia in chronic kidney disease: Secondary | ICD-10-CM | POA: Diagnosis not present

## 2022-10-16 DIAGNOSIS — Z992 Dependence on renal dialysis: Secondary | ICD-10-CM | POA: Diagnosis not present

## 2022-10-16 DIAGNOSIS — D509 Iron deficiency anemia, unspecified: Secondary | ICD-10-CM | POA: Diagnosis not present

## 2022-10-16 DIAGNOSIS — N186 End stage renal disease: Secondary | ICD-10-CM | POA: Diagnosis not present

## 2022-10-18 DIAGNOSIS — N186 End stage renal disease: Secondary | ICD-10-CM | POA: Diagnosis not present

## 2022-10-18 DIAGNOSIS — D509 Iron deficiency anemia, unspecified: Secondary | ICD-10-CM | POA: Diagnosis not present

## 2022-10-18 DIAGNOSIS — Z992 Dependence on renal dialysis: Secondary | ICD-10-CM | POA: Diagnosis not present

## 2022-10-18 DIAGNOSIS — N2581 Secondary hyperparathyroidism of renal origin: Secondary | ICD-10-CM | POA: Diagnosis not present

## 2022-10-18 DIAGNOSIS — D631 Anemia in chronic kidney disease: Secondary | ICD-10-CM | POA: Diagnosis not present

## 2022-10-20 DIAGNOSIS — D631 Anemia in chronic kidney disease: Secondary | ICD-10-CM | POA: Diagnosis not present

## 2022-10-20 DIAGNOSIS — D509 Iron deficiency anemia, unspecified: Secondary | ICD-10-CM | POA: Diagnosis not present

## 2022-10-20 DIAGNOSIS — Z992 Dependence on renal dialysis: Secondary | ICD-10-CM | POA: Diagnosis not present

## 2022-10-20 DIAGNOSIS — N2581 Secondary hyperparathyroidism of renal origin: Secondary | ICD-10-CM | POA: Diagnosis not present

## 2022-10-20 DIAGNOSIS — N186 End stage renal disease: Secondary | ICD-10-CM | POA: Diagnosis not present

## 2022-10-22 DIAGNOSIS — Z23 Encounter for immunization: Secondary | ICD-10-CM | POA: Diagnosis not present

## 2022-10-22 DIAGNOSIS — R7301 Impaired fasting glucose: Secondary | ICD-10-CM | POA: Diagnosis not present

## 2022-10-22 DIAGNOSIS — G4700 Insomnia, unspecified: Secondary | ICD-10-CM | POA: Diagnosis not present

## 2022-10-22 DIAGNOSIS — I480 Paroxysmal atrial fibrillation: Secondary | ICD-10-CM | POA: Diagnosis not present

## 2022-10-22 DIAGNOSIS — E559 Vitamin D deficiency, unspecified: Secondary | ICD-10-CM | POA: Diagnosis not present

## 2022-10-22 DIAGNOSIS — Z992 Dependence on renal dialysis: Secondary | ICD-10-CM | POA: Diagnosis not present

## 2022-10-22 DIAGNOSIS — J309 Allergic rhinitis, unspecified: Secondary | ICD-10-CM | POA: Diagnosis not present

## 2022-10-22 DIAGNOSIS — I10 Essential (primary) hypertension: Secondary | ICD-10-CM | POA: Diagnosis not present

## 2022-10-23 DIAGNOSIS — D631 Anemia in chronic kidney disease: Secondary | ICD-10-CM | POA: Diagnosis not present

## 2022-10-23 DIAGNOSIS — N186 End stage renal disease: Secondary | ICD-10-CM | POA: Diagnosis not present

## 2022-10-23 DIAGNOSIS — D509 Iron deficiency anemia, unspecified: Secondary | ICD-10-CM | POA: Diagnosis not present

## 2022-10-23 DIAGNOSIS — Z992 Dependence on renal dialysis: Secondary | ICD-10-CM | POA: Diagnosis not present

## 2022-10-23 DIAGNOSIS — N2581 Secondary hyperparathyroidism of renal origin: Secondary | ICD-10-CM | POA: Diagnosis not present

## 2022-10-25 DIAGNOSIS — D509 Iron deficiency anemia, unspecified: Secondary | ICD-10-CM | POA: Diagnosis not present

## 2022-10-25 DIAGNOSIS — Z992 Dependence on renal dialysis: Secondary | ICD-10-CM | POA: Diagnosis not present

## 2022-10-25 DIAGNOSIS — N2581 Secondary hyperparathyroidism of renal origin: Secondary | ICD-10-CM | POA: Diagnosis not present

## 2022-10-25 DIAGNOSIS — N186 End stage renal disease: Secondary | ICD-10-CM | POA: Diagnosis not present

## 2022-10-25 DIAGNOSIS — D631 Anemia in chronic kidney disease: Secondary | ICD-10-CM | POA: Diagnosis not present

## 2022-10-27 DIAGNOSIS — Z992 Dependence on renal dialysis: Secondary | ICD-10-CM | POA: Diagnosis not present

## 2022-10-27 DIAGNOSIS — N186 End stage renal disease: Secondary | ICD-10-CM | POA: Diagnosis not present

## 2022-10-27 DIAGNOSIS — D631 Anemia in chronic kidney disease: Secondary | ICD-10-CM | POA: Diagnosis not present

## 2022-10-27 DIAGNOSIS — D509 Iron deficiency anemia, unspecified: Secondary | ICD-10-CM | POA: Diagnosis not present

## 2022-10-27 DIAGNOSIS — N2581 Secondary hyperparathyroidism of renal origin: Secondary | ICD-10-CM | POA: Diagnosis not present

## 2022-10-30 DIAGNOSIS — N186 End stage renal disease: Secondary | ICD-10-CM | POA: Diagnosis not present

## 2022-10-30 DIAGNOSIS — D509 Iron deficiency anemia, unspecified: Secondary | ICD-10-CM | POA: Diagnosis not present

## 2022-10-30 DIAGNOSIS — Z992 Dependence on renal dialysis: Secondary | ICD-10-CM | POA: Diagnosis not present

## 2022-10-30 DIAGNOSIS — D631 Anemia in chronic kidney disease: Secondary | ICD-10-CM | POA: Diagnosis not present

## 2022-10-30 DIAGNOSIS — Z23 Encounter for immunization: Secondary | ICD-10-CM | POA: Diagnosis not present

## 2022-10-30 DIAGNOSIS — N2581 Secondary hyperparathyroidism of renal origin: Secondary | ICD-10-CM | POA: Diagnosis not present

## 2022-10-30 DIAGNOSIS — I129 Hypertensive chronic kidney disease with stage 1 through stage 4 chronic kidney disease, or unspecified chronic kidney disease: Secondary | ICD-10-CM | POA: Diagnosis not present

## 2022-11-01 DIAGNOSIS — D509 Iron deficiency anemia, unspecified: Secondary | ICD-10-CM | POA: Diagnosis not present

## 2022-11-01 DIAGNOSIS — Z992 Dependence on renal dialysis: Secondary | ICD-10-CM | POA: Diagnosis not present

## 2022-11-01 DIAGNOSIS — D631 Anemia in chronic kidney disease: Secondary | ICD-10-CM | POA: Diagnosis not present

## 2022-11-01 DIAGNOSIS — Z23 Encounter for immunization: Secondary | ICD-10-CM | POA: Diagnosis not present

## 2022-11-01 DIAGNOSIS — N2581 Secondary hyperparathyroidism of renal origin: Secondary | ICD-10-CM | POA: Diagnosis not present

## 2022-11-01 DIAGNOSIS — N186 End stage renal disease: Secondary | ICD-10-CM | POA: Diagnosis not present

## 2022-11-03 DIAGNOSIS — D631 Anemia in chronic kidney disease: Secondary | ICD-10-CM | POA: Diagnosis not present

## 2022-11-03 DIAGNOSIS — N2581 Secondary hyperparathyroidism of renal origin: Secondary | ICD-10-CM | POA: Diagnosis not present

## 2022-11-03 DIAGNOSIS — Z23 Encounter for immunization: Secondary | ICD-10-CM | POA: Diagnosis not present

## 2022-11-03 DIAGNOSIS — N186 End stage renal disease: Secondary | ICD-10-CM | POA: Diagnosis not present

## 2022-11-03 DIAGNOSIS — D509 Iron deficiency anemia, unspecified: Secondary | ICD-10-CM | POA: Diagnosis not present

## 2022-11-03 DIAGNOSIS — Z992 Dependence on renal dialysis: Secondary | ICD-10-CM | POA: Diagnosis not present

## 2022-11-06 DIAGNOSIS — N2581 Secondary hyperparathyroidism of renal origin: Secondary | ICD-10-CM | POA: Diagnosis not present

## 2022-11-06 DIAGNOSIS — N186 End stage renal disease: Secondary | ICD-10-CM | POA: Diagnosis not present

## 2022-11-06 DIAGNOSIS — D631 Anemia in chronic kidney disease: Secondary | ICD-10-CM | POA: Diagnosis not present

## 2022-11-06 DIAGNOSIS — Z992 Dependence on renal dialysis: Secondary | ICD-10-CM | POA: Diagnosis not present

## 2022-11-06 DIAGNOSIS — D509 Iron deficiency anemia, unspecified: Secondary | ICD-10-CM | POA: Diagnosis not present

## 2022-11-06 DIAGNOSIS — Z23 Encounter for immunization: Secondary | ICD-10-CM | POA: Diagnosis not present

## 2022-11-08 DIAGNOSIS — D631 Anemia in chronic kidney disease: Secondary | ICD-10-CM | POA: Diagnosis not present

## 2022-11-08 DIAGNOSIS — Z23 Encounter for immunization: Secondary | ICD-10-CM | POA: Diagnosis not present

## 2022-11-08 DIAGNOSIS — N186 End stage renal disease: Secondary | ICD-10-CM | POA: Diagnosis not present

## 2022-11-08 DIAGNOSIS — N2581 Secondary hyperparathyroidism of renal origin: Secondary | ICD-10-CM | POA: Diagnosis not present

## 2022-11-08 DIAGNOSIS — Z992 Dependence on renal dialysis: Secondary | ICD-10-CM | POA: Diagnosis not present

## 2022-11-08 DIAGNOSIS — D509 Iron deficiency anemia, unspecified: Secondary | ICD-10-CM | POA: Diagnosis not present

## 2022-11-10 DIAGNOSIS — N2581 Secondary hyperparathyroidism of renal origin: Secondary | ICD-10-CM | POA: Diagnosis not present

## 2022-11-10 DIAGNOSIS — Z992 Dependence on renal dialysis: Secondary | ICD-10-CM | POA: Diagnosis not present

## 2022-11-10 DIAGNOSIS — D631 Anemia in chronic kidney disease: Secondary | ICD-10-CM | POA: Diagnosis not present

## 2022-11-10 DIAGNOSIS — D509 Iron deficiency anemia, unspecified: Secondary | ICD-10-CM | POA: Diagnosis not present

## 2022-11-10 DIAGNOSIS — N186 End stage renal disease: Secondary | ICD-10-CM | POA: Diagnosis not present

## 2022-11-10 DIAGNOSIS — Z23 Encounter for immunization: Secondary | ICD-10-CM | POA: Diagnosis not present

## 2022-11-12 ENCOUNTER — Other Ambulatory Visit: Payer: Self-pay | Admitting: *Deleted

## 2022-11-12 DIAGNOSIS — N186 End stage renal disease: Secondary | ICD-10-CM

## 2022-11-13 DIAGNOSIS — Z23 Encounter for immunization: Secondary | ICD-10-CM | POA: Diagnosis not present

## 2022-11-13 DIAGNOSIS — D631 Anemia in chronic kidney disease: Secondary | ICD-10-CM | POA: Diagnosis not present

## 2022-11-13 DIAGNOSIS — D509 Iron deficiency anemia, unspecified: Secondary | ICD-10-CM | POA: Diagnosis not present

## 2022-11-13 DIAGNOSIS — Z992 Dependence on renal dialysis: Secondary | ICD-10-CM | POA: Diagnosis not present

## 2022-11-13 DIAGNOSIS — N2581 Secondary hyperparathyroidism of renal origin: Secondary | ICD-10-CM | POA: Diagnosis not present

## 2022-11-13 DIAGNOSIS — N186 End stage renal disease: Secondary | ICD-10-CM | POA: Diagnosis not present

## 2022-11-15 DIAGNOSIS — N2581 Secondary hyperparathyroidism of renal origin: Secondary | ICD-10-CM | POA: Diagnosis not present

## 2022-11-15 DIAGNOSIS — D631 Anemia in chronic kidney disease: Secondary | ICD-10-CM | POA: Diagnosis not present

## 2022-11-15 DIAGNOSIS — Z23 Encounter for immunization: Secondary | ICD-10-CM | POA: Diagnosis not present

## 2022-11-15 DIAGNOSIS — N186 End stage renal disease: Secondary | ICD-10-CM | POA: Diagnosis not present

## 2022-11-15 DIAGNOSIS — D509 Iron deficiency anemia, unspecified: Secondary | ICD-10-CM | POA: Diagnosis not present

## 2022-11-15 DIAGNOSIS — Z992 Dependence on renal dialysis: Secondary | ICD-10-CM | POA: Diagnosis not present

## 2022-11-16 ENCOUNTER — Other Ambulatory Visit (HOSPITAL_COMMUNITY): Payer: Self-pay | Admitting: Physician Assistant

## 2022-11-17 DIAGNOSIS — Z23 Encounter for immunization: Secondary | ICD-10-CM | POA: Diagnosis not present

## 2022-11-17 DIAGNOSIS — D631 Anemia in chronic kidney disease: Secondary | ICD-10-CM | POA: Diagnosis not present

## 2022-11-17 DIAGNOSIS — Z992 Dependence on renal dialysis: Secondary | ICD-10-CM | POA: Diagnosis not present

## 2022-11-17 DIAGNOSIS — N2581 Secondary hyperparathyroidism of renal origin: Secondary | ICD-10-CM | POA: Diagnosis not present

## 2022-11-17 DIAGNOSIS — D509 Iron deficiency anemia, unspecified: Secondary | ICD-10-CM | POA: Diagnosis not present

## 2022-11-17 DIAGNOSIS — N186 End stage renal disease: Secondary | ICD-10-CM | POA: Diagnosis not present

## 2022-11-20 DIAGNOSIS — Z23 Encounter for immunization: Secondary | ICD-10-CM | POA: Diagnosis not present

## 2022-11-20 DIAGNOSIS — D631 Anemia in chronic kidney disease: Secondary | ICD-10-CM | POA: Diagnosis not present

## 2022-11-20 DIAGNOSIS — D509 Iron deficiency anemia, unspecified: Secondary | ICD-10-CM | POA: Diagnosis not present

## 2022-11-20 DIAGNOSIS — Z992 Dependence on renal dialysis: Secondary | ICD-10-CM | POA: Diagnosis not present

## 2022-11-20 DIAGNOSIS — N2581 Secondary hyperparathyroidism of renal origin: Secondary | ICD-10-CM | POA: Diagnosis not present

## 2022-11-20 DIAGNOSIS — N186 End stage renal disease: Secondary | ICD-10-CM | POA: Diagnosis not present

## 2022-11-21 ENCOUNTER — Encounter: Payer: Self-pay | Admitting: Vascular Surgery

## 2022-11-21 ENCOUNTER — Ambulatory Visit (INDEPENDENT_AMBULATORY_CARE_PROVIDER_SITE_OTHER)
Admission: RE | Admit: 2022-11-21 | Discharge: 2022-11-21 | Disposition: A | Discharge status: Home / Self Care | Payer: Medicare Other | Source: Ambulatory Visit | Attending: Vascular Surgery | Admitting: Vascular Surgery

## 2022-11-21 ENCOUNTER — Ambulatory Visit (HOSPITAL_COMMUNITY)
Admission: RE | Admit: 2022-11-21 | Discharge: 2022-11-21 | Disposition: A | Discharge status: Home / Self Care | Payer: Medicare Other | Source: Ambulatory Visit | Attending: Vascular Surgery | Admitting: Vascular Surgery

## 2022-11-21 ENCOUNTER — Ambulatory Visit (INDEPENDENT_AMBULATORY_CARE_PROVIDER_SITE_OTHER): Payer: Medicare Other | Admitting: Vascular Surgery

## 2022-11-21 VITALS — BP 111/74 | HR 79 | Temp 98.1°F | Ht 72.0 in | Wt 158.1 lb

## 2022-11-21 DIAGNOSIS — N186 End stage renal disease: Secondary | ICD-10-CM

## 2022-11-21 NOTE — Progress Notes (Signed)
Patient ID: Glenn Olson, male   DOB: [birth year], 82 y.o.   MRN: [id number]  Reason for Consult: Follow-up   Referred by Ethelene Hal, MD  Subjective:     HPI:  Glenn Olson is a 82 y.o. 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  This recently thrombosed he is currently dialyzing via right IJ catheter.  Patient is right-hand dominant no other upper extremity surgeries in the past.  He did have attempted placement of PD catheter but this was abandoned due to intra-abdominal adhesions and he recovered well from that.  He does take Eliquis for atrial fibrillation but states that he is usually in normal rhythm.  Currently dialyzes Tuesdays Thursdays and Saturdays.  Patient walks with the help of a cane.  Past Medical History:  Diagnosis Date   Anemia    Chronic kidney disease    Dysrhythmia    GERD (gastroesophageal reflux disease)    Neurogenic bladder    No family history on file. Past Surgical History:  Procedure Laterality Date   AV FISTULA PLACEMENT Left 09/27/2020   Procedure: LEFT ARM ARTERIOVENOUS GRAFT PLACEMENT USING GORE-TEX 4-7MM X 45CM  VASCULAR GRAFT;  Surgeon: Larina Earthly, MD;  Location: [facility];  Service: Vascular;  Laterality: Left;   CAPD INSERTION N/A 03/17/2021   Procedure: ATTEMPTED INSERTION OF LAPAROSCOPIC CONTINUOUS AMBULATORY PERITONEAL DIALYSIS (CAPD) CATHETER;  Surgeon: Maeola Harman, MD;  Location: [facility] OR;  Service: Vascular;  Laterality: N/A;   CARDIOVERSION N/A 06/19/2022   Procedure: CARDIOVERSION;  Surgeon: Sande Rives, MD;  Location: [facility] INVASIVE CV LAB;  Service: Cardiovascular;  Laterality: N/A;   CYSTOURETHROSCOPY     INSERTION OF DIALYSIS CATHETER Right 09/27/2020   Procedure: INSERTION OF PALINDROME TUNNELED DIALYSIS CATHETER 14.15f X 23CM;  Surgeon: Larina Earthly, MD;  Location: [facility];  Service: Vascular;  Laterality: Right;   INSERTION OF DIALYSIS CATHETER  Right 05/31/2022   Procedure: INSERTION OF DIALYSIS CATHETER;  Surgeon: Victorino Sparrow, MD;  Location: [facility] OR;  Service: Vascular;  Laterality: Right;   IR REMOVAL TUN CV CATH W/O FL  06/20/2022   REMOVAL OF A DIALYSIS CATHETER N/A 12/06/2020   Procedure: MINOR REMOVAL OF A DIALYSIS CATHETER;  Surgeon: Larina Earthly, MD;  Location: [facility];  Service: Vascular;  Laterality: N/A;   SUPRAPUBIC CATHETER PLACEMENT     TEE WITHOUT CARDIOVERSION N/A 06/19/2022   Procedure: TRANSESOPHAGEAL ECHOCARDIOGRAM;  Surgeon: Sande Rives, MD;  Location: [facility] INVASIVE CV LAB;  Service: Cardiovascular;  Laterality: N/A;    Short Social History:  Social History   Tobacco Use   Smoking status: Never   Smokeless tobacco: Never   Tobacco comments:    Never smoked 09/26/22  Substance Use Topics   Alcohol use: Yes    Comment: patient states he is a social drinker    No Known Allergies  Current Outpatient Medications  Medication Sig Dispense Refill   amiodarone (PACERONE) 200 MG tablet Take 1 tablet (200 mg total) by mouth daily. 90 tablet 2   apixaban (ELIQUIS) 2.5 MG TABS tablet Take 1 tablet (2.5 mg total) by mouth 2 (two) times daily. 180 tablet 1   calcium acetate (PHOSLO) 667 MG capsule Take 1 capsule (667 mg total) by mouth 3 (three) times daily with meals. 90 capsule 0   loratadine (CLARITIN) 10 MG tablet Take 1 tablet (10 mg total) by mouth daily. 30 tablet 0  melatonin 3 MG TABS tablet Take 1 tablet (3 mg total) by mouth at bedtime as needed. 30 tablet 0   Methoxy PEG-Epoetin Beta (MIRCERA IJ) Mircera     Nutritional Supplements (FEEDING SUPPLEMENT, NEPRO CARB STEADY,) LIQD Take 237 mLs by mouth 2 (two) times daily between meals.  0   VELPHORO 500 MG chewable tablet Chew 500 mg by mouth 3 (three) times daily.     vitamin D3 (CHOLECALCIFEROL) 25 MCG tablet Take 1 tablet (1,000 Units total) by mouth daily. 30 tablet 0   calcitRIOL (ROCALTROL) 0.5 MCG capsule Take 1 capsule (0.5 mcg total) by  mouth daily. (Patient not taking: Reported on 11/21/2022) 30 capsule 0   No current facility-administered medications for this visit.    Review of Systems  Constitutional:  Constitutional negative. HENT: HENT negative.  Eyes: Eyes negative.  Cardiovascular: Cardiovascular negative.  GI: Gastrointestinal negative.  Musculoskeletal: Musculoskeletal negative.  Skin:       Thin skin Neurological: Neurological negative. Hematologic: Positive for bruises/bleeds easily.        Objective:  Objective   Vitals:   11/21/22 1223  BP: 111/74  Pulse: 79  Temp: 98.1 F (36.7 C)  TempSrc: Temporal  SpO2: 96%  Weight: 158 lb 1.6 oz (71.7 kg)  Height: 6' (1.829 m)   Body mass index is 21.44 kg/m.  Physical Exam HENT:     Head: Normocephalic.     Mouth/Throat:     Mouth: Mucous membranes are moist.  Eyes:     Pupils: Pupils are equal, round, and reactive to light.  Cardiovascular:     Pulses:          Radial pulses are 2+ on the right side and 2+ on the left side.  Pulmonary:     Effort: Pulmonary effort is normal.  Abdominal:     General: Abdomen is flat.  Musculoskeletal:        General: Normal range of motion.     Right lower leg: No edema.     Left lower leg: No edema.  Skin:    General: Skin is warm.     Capillary Refill: Capillary refill takes less than 2 seconds.  Neurological:     General: No focal deficit present.     Mental Status: He is alert.  Psychiatric:        Mood and Affect: Mood normal.        Thought Content: Thought content normal.        Judgment: Judgment normal.     Data: Right Pre-Dialysis Findings:  +-----------------------+----------+--------------------+---------+--------  +  Location              PSV (cm/s)Intralum. Diam. (cm)Waveform  Comments  +-----------------------+----------+--------------------+---------+--------  +  Brachial Antecub. fossa53        0.60                triphasic            +-----------------------+----------+--------------------+---------+--------  +  Radial Art at Wrist    52        0.28                triphasic           +-----------------------+----------+--------------------+---------+--------  +  Ulnar Art at Wrist     42        0.21                triphasic           +-----------------------+----------+--------------------+---------+--------  +  Left Pre-Dialysis Findings:  +-----------------------+----------+--------------------+---------+--------  +  Location              PSV (cm/s)Intralum. Diam. (cm)Waveform  Comments  +-----------------------+----------+--------------------+---------+--------  +  Brachial Antecub. fossa37        0.72                triphasic           +-----------------------+----------+--------------------+---------+--------  +  Radial Art at Wrist    52        0.22                triphasic           +-----------------------+----------+--------------------+---------+--------  +  Ulnar Art at Wrist     64        0.20                triphasic           +-----------------------+----------+--------------------+---------+--------   Right Cephalic   Diameter (cm)Depth (cm)Findings   +-----------------+-------------+----------+---------+  Prox upper arm       0.27        0.95              +-----------------+-------------+----------+---------+  Mid upper arm        0.26        0.37              +-----------------+-------------+----------+---------+  Dist upper arm       0.26        0.40              +-----------------+-------------+----------+---------+  Antecubital fossa    0.35        0.29              +-----------------+-------------+----------+---------+  Prox forearm         0.20        0.25   branching  +-----------------+-------------+----------+---------+  Mid forearm          0.18        0.32               +-----------------+-------------+----------+---------+  Dist forearm         0.16        0.10              +-----------------+-------------+----------+---------+   +-----------------+-------------+----------+--------+  Right Basilic    Diameter (cm)Depth (cm)Findings  +-----------------+-------------+----------+--------+  Prox upper arm       0.48        1.01             +-----------------+-------------+----------+--------+  Mid upper arm        0.47        0.83             +-----------------+-------------+----------+--------+  Dist upper arm       0.42        1.00             +-----------------+-------------+----------+--------+  Antecubital fossa    0.42        0.73             +-----------------+-------------+----------+--------+  Prox forearm         0.31        0.20             +-----------------+-------------+----------+--------+  Mid forearm          0.18        0.23             +-----------------+-------------+----------+--------+  Distal forearm       0.16        0.15             +-----------------+-------------+----------+--------+   +-----------------+-------------+----------+---------+  Left Cephalic    Diameter (cm)Depth (cm)Findings   +-----------------+-------------+----------+---------+  Prox upper arm       0.40        0.39              +-----------------+-------------+----------+---------+  Mid upper arm        0.36        0.27   branching  +-----------------+-------------+----------+---------+  Dist upper arm       0.46        0.26              +-----------------+-------------+----------+---------+  Antecubital fossa    0.29        0.40   branching  +-----------------+-------------+----------+---------+  Prox forearm         0.21        0.22              +-----------------+-------------+----------+---------+  Mid forearm          0.16        0.23               +-----------------+-------------+----------+---------+  Dist forearm         0.10        0.13              +-----------------+-------------+----------+---------+   +-----------------+-------------+----------+--------------+  Left Basilic     Diameter (cm)Depth (cm)   Findings     +-----------------+-------------+----------+--------------+  Prox upper arm       0.34        0.88                   +-----------------+-------------+----------+--------------+  Mid upper arm        0.35        0.84                   +-----------------+-------------+----------+--------------+  Dist upper arm       0.33        0.62                   +-----------------+-------------+----------+--------------+  Antecubital fossa    0.20        0.37                   +-----------------+-------------+----------+--------------+  Prox forearm         0.07        0.20                   +-----------------+-------------+----------+--------------+  Mid forearm                             not visualized  +-----------------+-------------+----------+--------------+  Distal forearm                          not visualized  +-----------------+-------------+----------+--------------+      Assessment/Plan:    82 year old male currently dialyzing Tuesdays, Thursdays and Saturdays via right IJ tunneled dialysis catheter with a thrombosed left forearm AV graft.  We have discussed proceeding with new upper extremity access and patient would prefer staying in the left arm where he has possibly a suitable basilic vein above the antecubital may be a suitable  cephalic vein by think this would be difficult to create a fistula given the placement of the previous AV graft.  Will proceed with possible fistula versus more likely upper arm AV graft on a nondialysis day in the near future.  Will need to hold Eliquis 48 hours prior to procedure.  All risk benefits alternatives discussed with patient and  family at bedside they demonstrate good understanding.     Maeola Harman MD [facility]

## 2022-11-22 ENCOUNTER — Telehealth: Payer: Self-pay

## 2022-11-22 ENCOUNTER — Other Ambulatory Visit: Payer: Self-pay

## 2022-11-22 ENCOUNTER — Encounter (HOSPITAL_COMMUNITY): Payer: Self-pay

## 2022-11-22 ENCOUNTER — Emergency Department (HOSPITAL_COMMUNITY)
Admission: EM | Admit: 2022-11-22 | Discharge: 2022-11-22 | Disposition: A | Discharge status: Home / Self Care | Payer: Medicare Other | Attending: Emergency Medicine | Admitting: Emergency Medicine

## 2022-11-22 ENCOUNTER — Emergency Department (HOSPITAL_COMMUNITY): Payer: Medicare Other

## 2022-11-22 DIAGNOSIS — I517 Cardiomegaly: Secondary | ICD-10-CM | POA: Diagnosis not present

## 2022-11-22 DIAGNOSIS — Z992 Dependence on renal dialysis: Secondary | ICD-10-CM | POA: Diagnosis not present

## 2022-11-22 DIAGNOSIS — Z23 Encounter for immunization: Secondary | ICD-10-CM | POA: Diagnosis not present

## 2022-11-22 DIAGNOSIS — N186 End stage renal disease: Secondary | ICD-10-CM | POA: Insufficient documentation

## 2022-11-22 DIAGNOSIS — Z7901 Long term (current) use of anticoagulants: Secondary | ICD-10-CM | POA: Insufficient documentation

## 2022-11-22 DIAGNOSIS — I4510 Unspecified right bundle-branch block: Secondary | ICD-10-CM | POA: Diagnosis not present

## 2022-11-22 DIAGNOSIS — N2581 Secondary hyperparathyroidism of renal origin: Secondary | ICD-10-CM | POA: Diagnosis not present

## 2022-11-22 DIAGNOSIS — R0789 Other chest pain: Secondary | ICD-10-CM | POA: Insufficient documentation

## 2022-11-22 DIAGNOSIS — R079 Chest pain, unspecified: Secondary | ICD-10-CM | POA: Diagnosis not present

## 2022-11-22 DIAGNOSIS — D509 Iron deficiency anemia, unspecified: Secondary | ICD-10-CM | POA: Diagnosis not present

## 2022-11-22 DIAGNOSIS — R531 Weakness: Secondary | ICD-10-CM | POA: Diagnosis not present

## 2022-11-22 DIAGNOSIS — R0989 Other specified symptoms and signs involving the circulatory and respiratory systems: Secondary | ICD-10-CM | POA: Diagnosis not present

## 2022-11-22 DIAGNOSIS — D631 Anemia in chronic kidney disease: Secondary | ICD-10-CM | POA: Diagnosis not present

## 2022-11-22 DIAGNOSIS — I700 Atherosclerosis of aorta: Secondary | ICD-10-CM | POA: Diagnosis not present

## 2022-11-22 LAB — CBC
HCT: 35.5 % — ABNORMAL LOW (ref 39.0–52.0)
Hemoglobin: 11 g/dL — ABNORMAL LOW (ref 13.0–17.0)
MCH: 30.3 pg (ref 26.0–34.0)
MCHC: 31 g/dL (ref 30.0–36.0)
MCV: 97.8 fL (ref 80.0–100.0)
Platelets: 141 10*3/uL — ABNORMAL LOW (ref 150–400)
RBC: 3.63 MIL/uL — ABNORMAL LOW (ref 4.22–5.81)
RDW: 17 % — ABNORMAL HIGH (ref 11.5–15.5)
WBC: 6.2 10*3/uL (ref 4.0–10.5)
nRBC: 0 % (ref 0.0–0.2)

## 2022-11-22 LAB — BASIC METABOLIC PANEL
Anion gap: 12 (ref 5–15)
BUN: 35 mg/dL — ABNORMAL HIGH (ref 8–23)
CO2: 27 mmol/L (ref 22–32)
Calcium: 8.5 mg/dL — ABNORMAL LOW (ref 8.9–10.3)
Chloride: 96 mmol/L — ABNORMAL LOW (ref 98–111)
Creatinine, Ser: 5.03 mg/dL — ABNORMAL HIGH (ref 0.61–1.24)
GFR, Estimated: 11 mL/min — ABNORMAL LOW (ref >60)
Glucose, Bld: 106 mg/dL — ABNORMAL HIGH (ref 70–99)
Potassium: 4.3 mmol/L (ref 3.5–5.1)
Sodium: 135 mmol/L (ref 135–145)

## 2022-11-22 LAB — TROPONIN I (HIGH SENSITIVITY)
Troponin I (High Sensitivity): 21 ng/L — ABNORMAL HIGH (ref <18)
Troponin I (High Sensitivity): 25 ng/L — ABNORMAL HIGH (ref <18)

## 2022-11-22 NOTE — Telephone Encounter (Signed)
Attempted to reach patient to schedule surgery. Patient at [facility]- EDP.

## 2022-11-22 NOTE — Discharge Instructions (Addendum)
Your labs and xray do not identify any serious or life threatening condition. You can be discharged home. Please plan to see your doctor for recheck in the next week.   If symptoms recur or worsen, please return to the ED immediately.

## 2022-11-22 NOTE — ED Provider Notes (Signed)
0.16        0.15            +-----------------+-------------+----------+--------+ +-----------------+-------------+----------+---------+ Left Cephalic    Diameter (cm)Depth (cm)Findings  +-----------------+-------------+----------+---------+ Prox upper arm       0.40        0.39             +-----------------+-------------+----------+---------+ Mid upper arm        0.36        0.27   branching +-----------------+-------------+----------+---------+ Dist upper arm       0.46        0.26             +-----------------+-------------+----------+---------+ Antecubital fossa    0.29        0.40   branching +-----------------+-------------+----------+---------+ Prox forearm         0.21        0.22             +-----------------+-------------+----------+---------+ Mid  forearm          0.16        0.23             +-----------------+-------------+----------+---------+ Dist forearm         0.10        0.13             +-----------------+-------------+----------+---------+ +-----------------+-------------+----------+--------------+ Left Basilic     Diameter (cm)Depth (cm)   Findings    +-----------------+-------------+----------+--------------+ Prox upper arm       0.34        0.88                  +-----------------+-------------+----------+--------------+ Mid upper arm        0.35        0.84                  +-----------------+-------------+----------+--------------+ Dist upper arm       0.33        0.62                  +-----------------+-------------+----------+--------------+ Antecubital fossa    0.20        0.37                  +-----------------+-------------+----------+--------------+ Prox forearm         0.07        0.20                  +-----------------+-------------+----------+--------------+ Mid forearm                             not visualized +-----------------+-------------+----------+--------------+ Distal forearm                          not visualized +-----------------+-------------+----------+--------------+ *See table(s) above for measurements and observations.  Diagnosing physician: Lemar Livings MD Electronically signed by Lemar Livings MD on 11/22/2022 at 9:43:09 AM.    Final    VAS [place] UPPER EXTREMITY ARTERIAL DUPLEX  Result Date: 11/22/2022  UPPER EXTREMITY DUPLEX STUDY Patient Name:  Glenn Olson  Date of Exam:   11/21/2022 Medical Rec #: [id number]       Accession #:    [id number] Date of Birth: 1940-02-11       Patient Gender: M Patient Age:   82 years Exam Location:  [facility] Procedure:      VAS [place] UPPER  0.16        0.15            +-----------------+-------------+----------+--------+ +-----------------+-------------+----------+---------+ Left Cephalic    Diameter (cm)Depth (cm)Findings  +-----------------+-------------+----------+---------+ Prox upper arm       0.40        0.39             +-----------------+-------------+----------+---------+ Mid upper arm        0.36        0.27   branching +-----------------+-------------+----------+---------+ Dist upper arm       0.46        0.26             +-----------------+-------------+----------+---------+ Antecubital fossa    0.29        0.40   branching +-----------------+-------------+----------+---------+ Prox forearm         0.21        0.22             +-----------------+-------------+----------+---------+ Mid  forearm          0.16        0.23             +-----------------+-------------+----------+---------+ Dist forearm         0.10        0.13             +-----------------+-------------+----------+---------+ +-----------------+-------------+----------+--------------+ Left Basilic     Diameter (cm)Depth (cm)   Findings    +-----------------+-------------+----------+--------------+ Prox upper arm       0.34        0.88                  +-----------------+-------------+----------+--------------+ Mid upper arm        0.35        0.84                  +-----------------+-------------+----------+--------------+ Dist upper arm       0.33        0.62                  +-----------------+-------------+----------+--------------+ Antecubital fossa    0.20        0.37                  +-----------------+-------------+----------+--------------+ Prox forearm         0.07        0.20                  +-----------------+-------------+----------+--------------+ Mid forearm                             not visualized +-----------------+-------------+----------+--------------+ Distal forearm                          not visualized +-----------------+-------------+----------+--------------+ *See table(s) above for measurements and observations.  Diagnosing physician: Lemar Livings MD Electronically signed by Lemar Livings MD on 11/22/2022 at 9:43:09 AM.    Final    VAS [place] UPPER EXTREMITY ARTERIAL DUPLEX  Result Date: 11/22/2022  UPPER EXTREMITY DUPLEX STUDY Patient Name:  Glenn Olson  Date of Exam:   11/21/2022 Medical Rec #: [id number]       Accession #:    [id number] Date of Birth: 1940-02-11       Patient Gender: M Patient Age:   82 years Exam Location:  [facility] Procedure:      VAS [place] UPPER  Overlapping cardiac leads. IMPRESSION: Enlarged heart with some central vascular congestion. No frank edema today. Stable right IJ dialysis catheter Electronically Signed   By: Karen Kays M.D.   On: 11/22/2022 16:00   VAS [place] UPPER EXT VEIN MAPPING (PRE-OP AVF)  Result Date: 11/22/2022 UPPER EXTREMITY VEIN MAPPING Patient Name:  Glenn Olson  Date of Exam:   11/21/2022 Medical Rec #: [id number]       Accession #:    [id number] Date of Birth: Jun 03, 1940       Patient Gender: M Patient Age:   47 years Exam Location:  [facility] Procedure:      VAS [place] UPPER EXT VEIN MAPPING (PRE-OP AVF) Referring Phys: Lemar Livings --------------------------------------------------------------------------------  Indications: Pre-access. History: 09/27/20 Left arm AV graft.  Performing Technologist: Lowell Guitar RVT, RDMS  Examination Guidelines: A complete evaluation includes B-mode imaging, spectral Doppler, color Doppler, and power Doppler as needed of all accessible portions of each vessel. Bilateral testing is considered an integral part of a complete examination. Limited examinations for reoccurring indications may be performed as noted. +-----------------+-------------+----------+---------+ Right Cephalic   Diameter (cm)Depth (cm)Findings  +-----------------+-------------+----------+---------+ Prox upper arm       0.27        0.95             +-----------------+-------------+----------+---------+ Mid upper arm        0.26        0.37             +-----------------+-------------+----------+---------+ Dist upper arm       0.26        0.40             +-----------------+-------------+----------+---------+ Antecubital fossa    0.35        0.29             +-----------------+-------------+----------+---------+ Prox forearm          0.20        0.25   branching +-----------------+-------------+----------+---------+ Mid forearm          0.18        0.32             +-----------------+-------------+----------+---------+ Dist forearm         0.16        0.10             +-----------------+-------------+----------+---------+ +-----------------+-------------+----------+--------+ Right Basilic    Diameter (cm)Depth (cm)Findings +-----------------+-------------+----------+--------+ Prox upper arm       0.48        1.01            +-----------------+-------------+----------+--------+ Mid upper arm        0.47        0.83            +-----------------+-------------+----------+--------+ Dist upper arm       0.42        1.00            +-----------------+-------------+----------+--------+ Antecubital fossa    0.42        0.73            +-----------------+-------------+----------+--------+ Prox forearm         0.31        0.20            +-----------------+-------------+----------+--------+ Mid forearm          0.18        0.23            +-----------------+-------------+----------+--------+ Distal forearm  0.16        0.15            +-----------------+-------------+----------+--------+ +-----------------+-------------+----------+---------+ Left Cephalic    Diameter (cm)Depth (cm)Findings  +-----------------+-------------+----------+---------+ Prox upper arm       0.40        0.39             +-----------------+-------------+----------+---------+ Mid upper arm        0.36        0.27   branching +-----------------+-------------+----------+---------+ Dist upper arm       0.46        0.26             +-----------------+-------------+----------+---------+ Antecubital fossa    0.29        0.40   branching +-----------------+-------------+----------+---------+ Prox forearm         0.21        0.22             +-----------------+-------------+----------+---------+ Mid  forearm          0.16        0.23             +-----------------+-------------+----------+---------+ Dist forearm         0.10        0.13             +-----------------+-------------+----------+---------+ +-----------------+-------------+----------+--------------+ Left Basilic     Diameter (cm)Depth (cm)   Findings    +-----------------+-------------+----------+--------------+ Prox upper arm       0.34        0.88                  +-----------------+-------------+----------+--------------+ Mid upper arm        0.35        0.84                  +-----------------+-------------+----------+--------------+ Dist upper arm       0.33        0.62                  +-----------------+-------------+----------+--------------+ Antecubital fossa    0.20        0.37                  +-----------------+-------------+----------+--------------+ Prox forearm         0.07        0.20                  +-----------------+-------------+----------+--------------+ Mid forearm                             not visualized +-----------------+-------------+----------+--------------+ Distal forearm                          not visualized +-----------------+-------------+----------+--------------+ *See table(s) above for measurements and observations.  Diagnosing physician: Lemar Livings MD Electronically signed by Lemar Livings MD on 11/22/2022 at 9:43:09 AM.    Final    VAS [place] UPPER EXTREMITY ARTERIAL DUPLEX  Result Date: 11/22/2022  UPPER EXTREMITY DUPLEX STUDY Patient Name:  Glenn Olson  Date of Exam:   11/21/2022 Medical Rec #: [id number]       Accession #:    [id number] Date of Birth: 1940-02-11       Patient Gender: M Patient Age:   82 years Exam Location:  [facility] Procedure:      VAS [place] UPPER  Overlapping cardiac leads. IMPRESSION: Enlarged heart with some central vascular congestion. No frank edema today. Stable right IJ dialysis catheter Electronically Signed   By: Karen Kays M.D.   On: 11/22/2022 16:00   VAS [place] UPPER EXT VEIN MAPPING (PRE-OP AVF)  Result Date: 11/22/2022 UPPER EXTREMITY VEIN MAPPING Patient Name:  Glenn Olson  Date of Exam:   11/21/2022 Medical Rec #: [id number]       Accession #:    [id number] Date of Birth: Jun 03, 1940       Patient Gender: M Patient Age:   47 years Exam Location:  [facility] Procedure:      VAS [place] UPPER EXT VEIN MAPPING (PRE-OP AVF) Referring Phys: Lemar Livings --------------------------------------------------------------------------------  Indications: Pre-access. History: 09/27/20 Left arm AV graft.  Performing Technologist: Lowell Guitar RVT, RDMS  Examination Guidelines: A complete evaluation includes B-mode imaging, spectral Doppler, color Doppler, and power Doppler as needed of all accessible portions of each vessel. Bilateral testing is considered an integral part of a complete examination. Limited examinations for reoccurring indications may be performed as noted. +-----------------+-------------+----------+---------+ Right Cephalic   Diameter (cm)Depth (cm)Findings  +-----------------+-------------+----------+---------+ Prox upper arm       0.27        0.95             +-----------------+-------------+----------+---------+ Mid upper arm        0.26        0.37             +-----------------+-------------+----------+---------+ Dist upper arm       0.26        0.40             +-----------------+-------------+----------+---------+ Antecubital fossa    0.35        0.29             +-----------------+-------------+----------+---------+ Prox forearm          0.20        0.25   branching +-----------------+-------------+----------+---------+ Mid forearm          0.18        0.32             +-----------------+-------------+----------+---------+ Dist forearm         0.16        0.10             +-----------------+-------------+----------+---------+ +-----------------+-------------+----------+--------+ Right Basilic    Diameter (cm)Depth (cm)Findings +-----------------+-------------+----------+--------+ Prox upper arm       0.48        1.01            +-----------------+-------------+----------+--------+ Mid upper arm        0.47        0.83            +-----------------+-------------+----------+--------+ Dist upper arm       0.42        1.00            +-----------------+-------------+----------+--------+ Antecubital fossa    0.42        0.73            +-----------------+-------------+----------+--------+ Prox forearm         0.31        0.20            +-----------------+-------------+----------+--------+ Mid forearm          0.18        0.23            +-----------------+-------------+----------+--------+ Distal forearm  Overlapping cardiac leads. IMPRESSION: Enlarged heart with some central vascular congestion. No frank edema today. Stable right IJ dialysis catheter Electronically Signed   By: Karen Kays M.D.   On: 11/22/2022 16:00   VAS [place] UPPER EXT VEIN MAPPING (PRE-OP AVF)  Result Date: 11/22/2022 UPPER EXTREMITY VEIN MAPPING Patient Name:  Glenn Olson  Date of Exam:   11/21/2022 Medical Rec #: [id number]       Accession #:    [id number] Date of Birth: Jun 03, 1940       Patient Gender: M Patient Age:   47 years Exam Location:  [facility] Procedure:      VAS [place] UPPER EXT VEIN MAPPING (PRE-OP AVF) Referring Phys: Lemar Livings --------------------------------------------------------------------------------  Indications: Pre-access. History: 09/27/20 Left arm AV graft.  Performing Technologist: Lowell Guitar RVT, RDMS  Examination Guidelines: A complete evaluation includes B-mode imaging, spectral Doppler, color Doppler, and power Doppler as needed of all accessible portions of each vessel. Bilateral testing is considered an integral part of a complete examination. Limited examinations for reoccurring indications may be performed as noted. +-----------------+-------------+----------+---------+ Right Cephalic   Diameter (cm)Depth (cm)Findings  +-----------------+-------------+----------+---------+ Prox upper arm       0.27        0.95             +-----------------+-------------+----------+---------+ Mid upper arm        0.26        0.37             +-----------------+-------------+----------+---------+ Dist upper arm       0.26        0.40             +-----------------+-------------+----------+---------+ Antecubital fossa    0.35        0.29             +-----------------+-------------+----------+---------+ Prox forearm          0.20        0.25   branching +-----------------+-------------+----------+---------+ Mid forearm          0.18        0.32             +-----------------+-------------+----------+---------+ Dist forearm         0.16        0.10             +-----------------+-------------+----------+---------+ +-----------------+-------------+----------+--------+ Right Basilic    Diameter (cm)Depth (cm)Findings +-----------------+-------------+----------+--------+ Prox upper arm       0.48        1.01            +-----------------+-------------+----------+--------+ Mid upper arm        0.47        0.83            +-----------------+-------------+----------+--------+ Dist upper arm       0.42        1.00            +-----------------+-------------+----------+--------+ Antecubital fossa    0.42        0.73            +-----------------+-------------+----------+--------+ Prox forearm         0.31        0.20            +-----------------+-------------+----------+--------+ Mid forearm          0.18        0.23            +-----------------+-------------+----------+--------+ Distal forearm

## 2022-11-22 NOTE — ED Triage Notes (Signed)
Pt BIB EMS from [facility]. Pt was in dialysis and started complaining of CP  "unable to describe." Pt refused pain medication at the facility. BP 88/73, HR 100, O2 saturation 93% RA. 300 mL bolus giving at dialysis center and BP went up to 100 systolic. Last BP 125/81, HR 87, O2 sat 95% on 2L. CBG 111.

## 2022-11-24 DIAGNOSIS — N2581 Secondary hyperparathyroidism of renal origin: Secondary | ICD-10-CM | POA: Diagnosis not present

## 2022-11-24 DIAGNOSIS — Z992 Dependence on renal dialysis: Secondary | ICD-10-CM | POA: Diagnosis not present

## 2022-11-24 DIAGNOSIS — N186 End stage renal disease: Secondary | ICD-10-CM | POA: Diagnosis not present

## 2022-11-24 DIAGNOSIS — D631 Anemia in chronic kidney disease: Secondary | ICD-10-CM | POA: Diagnosis not present

## 2022-11-24 DIAGNOSIS — D509 Iron deficiency anemia, unspecified: Secondary | ICD-10-CM | POA: Diagnosis not present

## 2022-11-24 DIAGNOSIS — Z23 Encounter for immunization: Secondary | ICD-10-CM | POA: Diagnosis not present

## 2022-11-27 DIAGNOSIS — N2581 Secondary hyperparathyroidism of renal origin: Secondary | ICD-10-CM | POA: Diagnosis not present

## 2022-11-27 DIAGNOSIS — D509 Iron deficiency anemia, unspecified: Secondary | ICD-10-CM | POA: Diagnosis not present

## 2022-11-27 DIAGNOSIS — D631 Anemia in chronic kidney disease: Secondary | ICD-10-CM | POA: Diagnosis not present

## 2022-11-27 DIAGNOSIS — Z23 Encounter for immunization: Secondary | ICD-10-CM | POA: Diagnosis not present

## 2022-11-27 DIAGNOSIS — Z992 Dependence on renal dialysis: Secondary | ICD-10-CM | POA: Diagnosis not present

## 2022-11-27 DIAGNOSIS — N186 End stage renal disease: Secondary | ICD-10-CM | POA: Diagnosis not present

## 2022-11-29 DIAGNOSIS — N186 End stage renal disease: Secondary | ICD-10-CM | POA: Diagnosis not present

## 2022-11-29 DIAGNOSIS — N2581 Secondary hyperparathyroidism of renal origin: Secondary | ICD-10-CM | POA: Diagnosis not present

## 2022-11-29 DIAGNOSIS — D509 Iron deficiency anemia, unspecified: Secondary | ICD-10-CM | POA: Diagnosis not present

## 2022-11-29 DIAGNOSIS — D631 Anemia in chronic kidney disease: Secondary | ICD-10-CM | POA: Diagnosis not present

## 2022-11-29 DIAGNOSIS — Z992 Dependence on renal dialysis: Secondary | ICD-10-CM | POA: Diagnosis not present

## 2022-11-29 DIAGNOSIS — Z23 Encounter for immunization: Secondary | ICD-10-CM | POA: Diagnosis not present

## 2022-11-30 DIAGNOSIS — Z992 Dependence on renal dialysis: Secondary | ICD-10-CM | POA: Diagnosis not present

## 2022-11-30 DIAGNOSIS — I129 Hypertensive chronic kidney disease with stage 1 through stage 4 chronic kidney disease, or unspecified chronic kidney disease: Secondary | ICD-10-CM | POA: Diagnosis not present

## 2022-11-30 DIAGNOSIS — N186 End stage renal disease: Secondary | ICD-10-CM | POA: Diagnosis not present

## 2022-12-01 DIAGNOSIS — D509 Iron deficiency anemia, unspecified: Secondary | ICD-10-CM | POA: Diagnosis not present

## 2022-12-01 DIAGNOSIS — Z992 Dependence on renal dialysis: Secondary | ICD-10-CM | POA: Diagnosis not present

## 2022-12-01 DIAGNOSIS — D631 Anemia in chronic kidney disease: Secondary | ICD-10-CM | POA: Diagnosis not present

## 2022-12-01 DIAGNOSIS — N186 End stage renal disease: Secondary | ICD-10-CM | POA: Diagnosis not present

## 2022-12-01 DIAGNOSIS — N2581 Secondary hyperparathyroidism of renal origin: Secondary | ICD-10-CM | POA: Diagnosis not present

## 2022-12-03 DIAGNOSIS — I10 Essential (primary) hypertension: Secondary | ICD-10-CM | POA: Diagnosis not present

## 2022-12-03 DIAGNOSIS — R079 Chest pain, unspecified: Secondary | ICD-10-CM | POA: Diagnosis not present

## 2022-12-03 DIAGNOSIS — Z992 Dependence on renal dialysis: Secondary | ICD-10-CM | POA: Diagnosis not present

## 2022-12-03 DIAGNOSIS — D649 Anemia, unspecified: Secondary | ICD-10-CM | POA: Diagnosis not present

## 2022-12-03 DIAGNOSIS — D631 Anemia in chronic kidney disease: Secondary | ICD-10-CM | POA: Diagnosis not present

## 2022-12-03 DIAGNOSIS — N186 End stage renal disease: Secondary | ICD-10-CM | POA: Diagnosis not present

## 2022-12-03 DIAGNOSIS — I120 Hypertensive chronic kidney disease with stage 5 chronic kidney disease or end stage renal disease: Secondary | ICD-10-CM | POA: Diagnosis not present

## 2022-12-04 DIAGNOSIS — Z992 Dependence on renal dialysis: Secondary | ICD-10-CM | POA: Diagnosis not present

## 2022-12-04 DIAGNOSIS — N2581 Secondary hyperparathyroidism of renal origin: Secondary | ICD-10-CM | POA: Diagnosis not present

## 2022-12-04 DIAGNOSIS — D631 Anemia in chronic kidney disease: Secondary | ICD-10-CM | POA: Diagnosis not present

## 2022-12-04 DIAGNOSIS — N186 End stage renal disease: Secondary | ICD-10-CM | POA: Diagnosis not present

## 2022-12-04 DIAGNOSIS — D509 Iron deficiency anemia, unspecified: Secondary | ICD-10-CM | POA: Diagnosis not present

## 2022-12-06 DIAGNOSIS — D631 Anemia in chronic kidney disease: Secondary | ICD-10-CM | POA: Diagnosis not present

## 2022-12-06 DIAGNOSIS — N2581 Secondary hyperparathyroidism of renal origin: Secondary | ICD-10-CM | POA: Diagnosis not present

## 2022-12-06 DIAGNOSIS — N186 End stage renal disease: Secondary | ICD-10-CM | POA: Diagnosis not present

## 2022-12-06 DIAGNOSIS — D509 Iron deficiency anemia, unspecified: Secondary | ICD-10-CM | POA: Diagnosis not present

## 2022-12-06 DIAGNOSIS — Z992 Dependence on renal dialysis: Secondary | ICD-10-CM | POA: Diagnosis not present

## 2022-12-08 DIAGNOSIS — Z992 Dependence on renal dialysis: Secondary | ICD-10-CM | POA: Diagnosis not present

## 2022-12-08 DIAGNOSIS — D631 Anemia in chronic kidney disease: Secondary | ICD-10-CM | POA: Diagnosis not present

## 2022-12-08 DIAGNOSIS — N2581 Secondary hyperparathyroidism of renal origin: Secondary | ICD-10-CM | POA: Diagnosis not present

## 2022-12-08 DIAGNOSIS — N186 End stage renal disease: Secondary | ICD-10-CM | POA: Diagnosis not present

## 2022-12-08 DIAGNOSIS — D509 Iron deficiency anemia, unspecified: Secondary | ICD-10-CM | POA: Diagnosis not present

## 2022-12-11 ENCOUNTER — Other Ambulatory Visit: Payer: Self-pay

## 2022-12-11 DIAGNOSIS — N186 End stage renal disease: Secondary | ICD-10-CM

## 2022-12-11 DIAGNOSIS — Z992 Dependence on renal dialysis: Secondary | ICD-10-CM | POA: Diagnosis not present

## 2022-12-11 DIAGNOSIS — D631 Anemia in chronic kidney disease: Secondary | ICD-10-CM | POA: Diagnosis not present

## 2022-12-11 DIAGNOSIS — N2581 Secondary hyperparathyroidism of renal origin: Secondary | ICD-10-CM | POA: Diagnosis not present

## 2022-12-11 DIAGNOSIS — D509 Iron deficiency anemia, unspecified: Secondary | ICD-10-CM | POA: Diagnosis not present

## 2022-12-11 NOTE — Telephone Encounter (Signed)
Spoke with Evette Doffing, RN at [facility] requesting to schedule patient for left arm AVF/AVG. Patient scheduled for 12/13 with instructions provided and faxed to facility to be provided to patient. She voiced understanding.

## 2022-12-13 DIAGNOSIS — D509 Iron deficiency anemia, unspecified: Secondary | ICD-10-CM | POA: Diagnosis not present

## 2022-12-13 DIAGNOSIS — N2581 Secondary hyperparathyroidism of renal origin: Secondary | ICD-10-CM | POA: Diagnosis not present

## 2022-12-13 DIAGNOSIS — D631 Anemia in chronic kidney disease: Secondary | ICD-10-CM | POA: Diagnosis not present

## 2022-12-13 DIAGNOSIS — Z992 Dependence on renal dialysis: Secondary | ICD-10-CM | POA: Diagnosis not present

## 2022-12-13 DIAGNOSIS — N186 End stage renal disease: Secondary | ICD-10-CM | POA: Diagnosis not present

## 2022-12-15 DIAGNOSIS — Z992 Dependence on renal dialysis: Secondary | ICD-10-CM | POA: Diagnosis not present

## 2022-12-15 DIAGNOSIS — N2581 Secondary hyperparathyroidism of renal origin: Secondary | ICD-10-CM | POA: Diagnosis not present

## 2022-12-15 DIAGNOSIS — N186 End stage renal disease: Secondary | ICD-10-CM | POA: Diagnosis not present

## 2022-12-15 DIAGNOSIS — D631 Anemia in chronic kidney disease: Secondary | ICD-10-CM | POA: Diagnosis not present

## 2022-12-15 DIAGNOSIS — D509 Iron deficiency anemia, unspecified: Secondary | ICD-10-CM | POA: Diagnosis not present

## 2022-12-17 DIAGNOSIS — N2581 Secondary hyperparathyroidism of renal origin: Secondary | ICD-10-CM | POA: Diagnosis not present

## 2022-12-17 DIAGNOSIS — Z992 Dependence on renal dialysis: Secondary | ICD-10-CM | POA: Diagnosis not present

## 2022-12-17 DIAGNOSIS — E039 Hypothyroidism, unspecified: Secondary | ICD-10-CM | POA: Diagnosis not present

## 2022-12-17 DIAGNOSIS — N186 End stage renal disease: Secondary | ICD-10-CM | POA: Diagnosis not present

## 2022-12-17 DIAGNOSIS — Z4931 Encounter for adequacy testing for hemodialysis: Secondary | ICD-10-CM | POA: Diagnosis not present

## 2022-12-18 DIAGNOSIS — N2581 Secondary hyperparathyroidism of renal origin: Secondary | ICD-10-CM | POA: Diagnosis not present

## 2022-12-18 DIAGNOSIS — Z4931 Encounter for adequacy testing for hemodialysis: Secondary | ICD-10-CM | POA: Diagnosis not present

## 2022-12-18 DIAGNOSIS — Z992 Dependence on renal dialysis: Secondary | ICD-10-CM | POA: Diagnosis not present

## 2022-12-18 DIAGNOSIS — N186 End stage renal disease: Secondary | ICD-10-CM | POA: Diagnosis not present

## 2022-12-20 DIAGNOSIS — Z4931 Encounter for adequacy testing for hemodialysis: Secondary | ICD-10-CM | POA: Diagnosis not present

## 2022-12-20 DIAGNOSIS — N186 End stage renal disease: Secondary | ICD-10-CM | POA: Diagnosis not present

## 2022-12-20 DIAGNOSIS — Z992 Dependence on renal dialysis: Secondary | ICD-10-CM | POA: Diagnosis not present

## 2022-12-20 DIAGNOSIS — N2581 Secondary hyperparathyroidism of renal origin: Secondary | ICD-10-CM | POA: Diagnosis not present

## 2022-12-21 DIAGNOSIS — N186 End stage renal disease: Secondary | ICD-10-CM | POA: Diagnosis not present

## 2022-12-21 DIAGNOSIS — Z4931 Encounter for adequacy testing for hemodialysis: Secondary | ICD-10-CM | POA: Diagnosis not present

## 2022-12-21 DIAGNOSIS — Z992 Dependence on renal dialysis: Secondary | ICD-10-CM | POA: Diagnosis not present

## 2022-12-21 DIAGNOSIS — N2581 Secondary hyperparathyroidism of renal origin: Secondary | ICD-10-CM | POA: Diagnosis not present

## 2022-12-24 DIAGNOSIS — N186 End stage renal disease: Secondary | ICD-10-CM | POA: Diagnosis not present

## 2022-12-24 DIAGNOSIS — N2581 Secondary hyperparathyroidism of renal origin: Secondary | ICD-10-CM | POA: Diagnosis not present

## 2022-12-24 DIAGNOSIS — Z4931 Encounter for adequacy testing for hemodialysis: Secondary | ICD-10-CM | POA: Diagnosis not present

## 2022-12-24 DIAGNOSIS — Z992 Dependence on renal dialysis: Secondary | ICD-10-CM | POA: Diagnosis not present

## 2022-12-25 DIAGNOSIS — Z992 Dependence on renal dialysis: Secondary | ICD-10-CM | POA: Diagnosis not present

## 2022-12-25 DIAGNOSIS — N2581 Secondary hyperparathyroidism of renal origin: Secondary | ICD-10-CM | POA: Diagnosis not present

## 2022-12-25 DIAGNOSIS — N186 End stage renal disease: Secondary | ICD-10-CM | POA: Diagnosis not present

## 2022-12-25 DIAGNOSIS — Z4931 Encounter for adequacy testing for hemodialysis: Secondary | ICD-10-CM | POA: Diagnosis not present

## 2022-12-28 DIAGNOSIS — Z4931 Encounter for adequacy testing for hemodialysis: Secondary | ICD-10-CM | POA: Diagnosis not present

## 2022-12-28 DIAGNOSIS — N186 End stage renal disease: Secondary | ICD-10-CM | POA: Diagnosis not present

## 2022-12-28 DIAGNOSIS — N2581 Secondary hyperparathyroidism of renal origin: Secondary | ICD-10-CM | POA: Diagnosis not present

## 2022-12-28 DIAGNOSIS — Z992 Dependence on renal dialysis: Secondary | ICD-10-CM | POA: Diagnosis not present

## 2022-12-30 DIAGNOSIS — Z992 Dependence on renal dialysis: Secondary | ICD-10-CM | POA: Diagnosis not present

## 2022-12-30 DIAGNOSIS — I129 Hypertensive chronic kidney disease with stage 1 through stage 4 chronic kidney disease, or unspecified chronic kidney disease: Secondary | ICD-10-CM | POA: Diagnosis not present

## 2022-12-30 DIAGNOSIS — N186 End stage renal disease: Secondary | ICD-10-CM | POA: Diagnosis not present

## 2022-12-31 DIAGNOSIS — N186 End stage renal disease: Secondary | ICD-10-CM | POA: Diagnosis not present

## 2022-12-31 DIAGNOSIS — Z992 Dependence on renal dialysis: Secondary | ICD-10-CM | POA: Diagnosis not present

## 2022-12-31 DIAGNOSIS — D631 Anemia in chronic kidney disease: Secondary | ICD-10-CM | POA: Diagnosis not present

## 2022-12-31 DIAGNOSIS — N2581 Secondary hyperparathyroidism of renal origin: Secondary | ICD-10-CM | POA: Diagnosis not present

## 2022-12-31 DIAGNOSIS — D509 Iron deficiency anemia, unspecified: Secondary | ICD-10-CM | POA: Diagnosis not present

## 2022-12-31 DIAGNOSIS — E039 Hypothyroidism, unspecified: Secondary | ICD-10-CM | POA: Diagnosis not present

## 2023-01-01 DIAGNOSIS — D631 Anemia in chronic kidney disease: Secondary | ICD-10-CM | POA: Diagnosis not present

## 2023-01-01 DIAGNOSIS — N2581 Secondary hyperparathyroidism of renal origin: Secondary | ICD-10-CM | POA: Diagnosis not present

## 2023-01-01 DIAGNOSIS — Z992 Dependence on renal dialysis: Secondary | ICD-10-CM | POA: Diagnosis not present

## 2023-01-01 DIAGNOSIS — N186 End stage renal disease: Secondary | ICD-10-CM | POA: Diagnosis not present

## 2023-01-01 DIAGNOSIS — D509 Iron deficiency anemia, unspecified: Secondary | ICD-10-CM | POA: Diagnosis not present

## 2023-01-03 DIAGNOSIS — N186 End stage renal disease: Secondary | ICD-10-CM | POA: Diagnosis not present

## 2023-01-03 DIAGNOSIS — Z992 Dependence on renal dialysis: Secondary | ICD-10-CM | POA: Diagnosis not present

## 2023-01-03 DIAGNOSIS — D509 Iron deficiency anemia, unspecified: Secondary | ICD-10-CM | POA: Diagnosis not present

## 2023-01-03 DIAGNOSIS — N2581 Secondary hyperparathyroidism of renal origin: Secondary | ICD-10-CM | POA: Diagnosis not present

## 2023-01-03 DIAGNOSIS — D631 Anemia in chronic kidney disease: Secondary | ICD-10-CM | POA: Diagnosis not present

## 2023-01-04 DIAGNOSIS — D631 Anemia in chronic kidney disease: Secondary | ICD-10-CM | POA: Diagnosis not present

## 2023-01-04 DIAGNOSIS — D509 Iron deficiency anemia, unspecified: Secondary | ICD-10-CM | POA: Diagnosis not present

## 2023-01-04 DIAGNOSIS — Z992 Dependence on renal dialysis: Secondary | ICD-10-CM | POA: Diagnosis not present

## 2023-01-04 DIAGNOSIS — N186 End stage renal disease: Secondary | ICD-10-CM | POA: Diagnosis not present

## 2023-01-04 DIAGNOSIS — N2581 Secondary hyperparathyroidism of renal origin: Secondary | ICD-10-CM | POA: Diagnosis not present

## 2023-01-07 DIAGNOSIS — D631 Anemia in chronic kidney disease: Secondary | ICD-10-CM | POA: Diagnosis not present

## 2023-01-07 DIAGNOSIS — N2581 Secondary hyperparathyroidism of renal origin: Secondary | ICD-10-CM | POA: Diagnosis not present

## 2023-01-07 DIAGNOSIS — D509 Iron deficiency anemia, unspecified: Secondary | ICD-10-CM | POA: Diagnosis not present

## 2023-01-07 DIAGNOSIS — N186 End stage renal disease: Secondary | ICD-10-CM | POA: Diagnosis not present

## 2023-01-07 DIAGNOSIS — Z992 Dependence on renal dialysis: Secondary | ICD-10-CM | POA: Diagnosis not present

## 2023-01-08 DIAGNOSIS — Z992 Dependence on renal dialysis: Secondary | ICD-10-CM | POA: Diagnosis not present

## 2023-01-08 DIAGNOSIS — N186 End stage renal disease: Secondary | ICD-10-CM | POA: Diagnosis not present

## 2023-01-08 DIAGNOSIS — N2581 Secondary hyperparathyroidism of renal origin: Secondary | ICD-10-CM | POA: Diagnosis not present

## 2023-01-08 DIAGNOSIS — D631 Anemia in chronic kidney disease: Secondary | ICD-10-CM | POA: Diagnosis not present

## 2023-01-08 DIAGNOSIS — D509 Iron deficiency anemia, unspecified: Secondary | ICD-10-CM | POA: Diagnosis not present

## 2023-01-10 ENCOUNTER — Encounter (HOSPITAL_COMMUNITY): Payer: Self-pay | Admitting: Vascular Surgery

## 2023-01-10 ENCOUNTER — Other Ambulatory Visit: Payer: Self-pay

## 2023-01-10 ENCOUNTER — Telehealth: Payer: Self-pay

## 2023-01-10 DIAGNOSIS — N2581 Secondary hyperparathyroidism of renal origin: Secondary | ICD-10-CM | POA: Diagnosis not present

## 2023-01-10 DIAGNOSIS — N186 End stage renal disease: Secondary | ICD-10-CM | POA: Diagnosis not present

## 2023-01-10 DIAGNOSIS — Z992 Dependence on renal dialysis: Secondary | ICD-10-CM | POA: Diagnosis not present

## 2023-01-10 DIAGNOSIS — D509 Iron deficiency anemia, unspecified: Secondary | ICD-10-CM | POA: Diagnosis not present

## 2023-01-10 DIAGNOSIS — D631 Anemia in chronic kidney disease: Secondary | ICD-10-CM | POA: Diagnosis not present

## 2023-01-10 NOTE — Progress Notes (Signed)
Spoke with patient's son states he forgot to hold Eliquis states his dad did not received PM dose on 01-09-23 or am dose today. Medication to be held 48 hours prior to procedure [facility] 01-11-23.  Anesthesia made aware and message left with nyokea at Dr. Darcella Cheshire office

## 2023-01-10 NOTE — Progress Notes (Addendum)
PCP - Nita Sells Cardiologist - Corky Crafts, MD   PPM/ICD - denies Device Orders - n/a Rep Notified - n/a  Chest x-ray - 11-22-22 EKG - 11-22-22 Stress Test -  ECHO - 06-05-22 Cardiac Cath -   CPAP - denies  DM denies  Blood Thinner Instructions:  apixaban (ELIQUIS) Last dose 12-10-22 PM dose held per son and no morning dose today ( hold for 48 hours prior to precedure) Aspirin Instructions: n/a  ERAS Protcol - NPO  COVID TEST- no Information obtained by son  Anesthesia review: Yes Hx of ERS DM A fib Dialysis  Tues, Thursday and Sat. Per patient son he forgot to hold Eliquis patient did not received PM dose 12-10-22 and medication held today  Patient verbally denies any shortness of breath, fever, cough and chest pain during phone call   -------------  SDW INSTRUCTIONS given:  Your procedure is scheduled on January 11, 2023.  Report to [facility] Main Entrance "A" at 6:30 A.M., and check in at the Admitting office.  Call this number if you have problems the morning of surgery:  [phone]   Remember:  Do not eat or drinks after midnight the night before your surgery      Take these medicines the morning of surgery with A SIP OF WATER  amiodarone (PACERONE)  loratadine (CLARITIN)   As of today, STOP taking any Aspirin (unless otherwise instructed by your surgeon) Aleve, Naproxen, Ibuprofen, Motrin, Advil, Goody's, BC's, all herbal medications, fish oil, and all vitamins.                      Do not wear jewelry, make up, or nail polish            Do not wear lotions, powders, perfumes/colognes, or deodorant.            Do not shave 48 hours prior to surgery.  Men may shave face and neck.            Do not bring valuables to the hospital.            [facility] is not responsible for any belongings or valuables.  Do NOT Smoke (Tobacco/Vaping) 24 hours prior to your procedure If you use a CPAP at night, you may bring all equipment for your overnight  stay.   Contacts, glasses, dentures or bridgework may not be worn into surgery.      For patients admitted to the hospital, discharge time will be determined by your treatment team.   Patients discharged the day of surgery will not be allowed to drive home, and someone needs to stay with them for 24 hours.    Special instructions:   [place]- Preparing For Surgery  Before surgery, you can play an important role. Because skin is not sterile, your skin needs to be as free of germs as possible. You can reduce the number of germs on your skin by washing with CHG (chlorahexidine gluconate) Soap before surgery.  CHG is an antiseptic cleaner which kills germs and bonds with the skin to continue killing germs even after washing.    Oral Hygiene is also important to reduce your risk of infection.  Remember - BRUSH YOUR TEETH THE MORNING OF SURGERY WITH YOUR REGULAR TOOTHPASTE  Please do not use if you have an allergy to CHG or antibacterial soaps. If your skin becomes reddened/irritated stop using the CHG.  Do not shave (including legs and underarms) for at  least 48 hours prior to first CHG shower. It is OK to shave your face.  Please follow these instructions carefully.   Shower the NIGHT BEFORE SURGERY and the MORNING OF SURGERY with DIAL Soap.   Pat yourself dry with a CLEAN TOWEL.  Wear CLEAN PAJAMAS to bed the night before surgery  Place CLEAN SHEETS on your bed the night of your first shower and DO NOT SLEEP WITH PETS.   Day of Surgery: Please shower morning of surgery  Wear Clean/Comfortable clothing the morning of surgery Do not apply any deodorants/lotions.   Remember to brush your teeth WITH YOUR REGULAR TOOTHPASTE.   Questions were answered. Patient verbalized understanding of instructions.

## 2023-01-10 NOTE — Telephone Encounter (Addendum)
Received notification from Garner Gavel., RN with [facility]- preadmission testing, advising that patient did not stop Eliquis 48 hours prior to surgery scheduled on tomorrow, but mistakenly took a dose on yesterday morning, but held medication since. Per Dr. Randie Heinz, OK to proceed with surgery.

## 2023-01-10 NOTE — Anesthesia Preprocedure Evaluation (Addendum)
Anesthesia Evaluation  Patient identified by MRN, date of birth, ID band Patient awake    Reviewed: Allergy & Precautions, NPO status , Patient's Chart, lab work & pertinent test results  Airway Mallampati: II  TM Distance: >3 FB Neck ROM: Full    Dental  (+) Dental Advisory Given   Pulmonary neg pulmonary ROS   Pulmonary exam normal breath sounds clear to auscultation       Cardiovascular hypertension, Pt. on medications Normal cardiovascular exam+ dysrhythmias Atrial Fibrillation  Rhythm:Regular Rate:Normal     Neuro/Psych negative neurological ROS     GI/Hepatic Neg liver ROS,GERD  ,,  Endo/Other  negative endocrine ROS    Renal/GU ESRF and DialysisRenal disease Bladder dysfunction      Musculoskeletal negative musculoskeletal ROS (+)    Abdominal   Peds  Hematology negative hematology ROS (+)   Anesthesia Other Findings   Reproductive/Obstetrics                             Anesthesia Physical Anesthesia Plan  ASA: 3  Anesthesia Plan: Regional   Post-op Pain Management: Regional block* and Tylenol PO (pre-op)*   Induction: Intravenous  PONV Risk Score and Plan: 1 and TIVA, Dexamethasone and Ondansetron  Airway Management Planned: Natural Airway and Simple Face Mask  Additional Equipment:   Intra-op Plan:   Post-operative Plan:   Informed Consent: I have reviewed the patients History and Physical, chart, labs and discussed the procedure including the risks, benefits and alternatives for the proposed anesthesia with the patient or authorized representative who has indicated his/her understanding and acceptance.       Plan Discussed with: CRNA  Anesthesia Plan Comments: (PAT note written 01/10/2023 by Shonna Chock, PA-C.  )       Anesthesia Quick Evaluation

## 2023-01-10 NOTE — Progress Notes (Signed)
Spoke to pt's son Keaundre Gallet, pt will arrive tom at 0530. NPO post mn.

## 2023-01-10 NOTE — Progress Notes (Signed)
Anesthesia Chart Review: Glenn Olson  Case: [id number] Date/Time: 01/11/23 0850   Procedure: LEFT ARM ARTERIOVENOUS (AV) FISTULA VERSUS ARTERIOVENOUS GRAFT CREATION (Left)   Anesthesia type: Choice   Pre-op diagnosis: ESRD   [facility]: [facility] ROOM 11 / [facility]   Surgeons: Maeola Harman, MD       DISCUSSION: Patient is an 82 year old male scheduled for the above procedure.  History includes never smoker, ESRD (HD initiated ~ 10/2020; diagnostic laparotomy 03/27/21 but PD catheter not inserted due to significant adhesions, patient declined HD ~ 14 months, resumed 05/31/22), anemia, neurogenic bladder (s/p suprapubic tube 08/17/08), colon cancer (s/p resection), GERD, dysrhythmia (afib/flutter 05/30/22, s/p DCCV 06/19/22).   [place] admission 05/30/22 - 06/20/22 after about 1 month history of progressive decline in setting of stopping hemodialysis and all medications about 14 months prior. He finally agreed to go to the ED after experiencing working headache, chest pain, shortness of breath for a week and was diagnosed with uremia and metabolic acidosis . Nephrology recommended [facility] to initiate hemodialysis. Right internal jugular TDC placed on 05/31/22. HD initiated, but he only tolerated about 30 minutes due to agitation. Palliative care consulted, but he ultimately agreed to continue dialysis. Cardiologist Dr. Eldridge Dace consulted on 06/05/22 after he developed afib/flutter with RVR, which had been present at least intermittently since admission. Electrolyte derangements includes hypokalemia at 2.4 and hypomagnesemia at 1.6 which were both supplemented. HGB was as low as 6.7. Received 1 unit PRBC. Arrhythmia felt likely due to ESRD and metabolic acidosis with electrolyte derangements  Oral diltiazem and heparin initiated. He ultimately underwent TEE with DCCV on 06/19/22 for symptomatic aflutter. Transitioned to Eliquis. Discharged to CIR for rehab 06/20/22 - 06/28/22.   Last cardiology evaluation was on  09/26/22 with Alphonzo Severance, PA-C at the [facility]. EKG then showed SR, RBBB, 1st degree AV block. 06/05/22 echo showed LVEF 55-60%, no regional wall motion abnormalities, mild concentric LVH, normal RV systolic function, mildly elevated pulmonary artery systolic pressure, estimated RVSP 44.2 mmHg.  Moderately dilated LA, mildly dilated RA, trivial MR. Continue amiodarone, Eliquis. Follow-up in six months planned.   Last Eliquis was to be 01/08/23, but he inadvertently took 01/09/23 AM dose. RN notified [facility] staff.    Anesthesia team to evaluate on the day of surgery.    VS: Ht 6' (1.829 m)   Wt 72.6 kg   BMI 21.70 kg/m  BP Readings from Last 3 Encounters:  11/22/22 (!) 148/89  11/21/22 111/74  09/26/22 132/60   Pulse Readings from Last 3 Encounters:  11/22/22 76  11/21/22 79  09/26/22 87     PROVIDERS: Benita Stabile, MD is PCP  Crista Elliot, MD is nephrologist  Mealor, Jari Pigg, MD is EP cardiologist Lance Muss, MD is cardiologist   LABS: For day of surgery. Most recent lab results in [facility] include: Lab Results  Component Value Date   WBC 6.2 11/22/2022   HGB 11.0 (L) 11/22/2022   HCT 35.5 (L) 11/22/2022   PLT 141 (L) 11/22/2022   GLUCOSE 106 (H) 11/22/2022   ALT 13 09/26/2022   AST 12 (L) 09/26/2022   NA 135 11/22/2022   K 4.3 11/22/2022   CL 96 (L) 11/22/2022   CREATININE 5.03 (H) 11/22/2022   BUN 35 (H) 11/22/2022   CO2 27 11/22/2022   TSH 4.774 (H) 09/26/2022   INR 1.4 (H) 06/18/2022     IMAGES: CXR 11/22/22: IMPRESSION: Enlarged heart with some central vascular congestion. No frank edema today.  Stable right IJ dialysis catheter  CT Renal Stone Study 07/11/22: IMPRESSION: Suprapubic catheter is noted with the balloon within the lumen of the bladder. The tip however appears to of tunneled into the left lateral bladder wall likely contributing to the decreased urinary output. The bladder is only partially distended however. - No other focal  abnormality is noted.  EKG:  EKG 11/22/22: Ectopic atrial rhythm RBBB and LAFB No significant change since prior 8/24 Confirmed by Meridee Score [phone] on 11/22/2022 2:25:32 PM  CV: TEE with DCCV 06/19/22: IMPRESSIONS   1. Left ventricular ejection fraction, by estimation, is 50 to 55%. The  left ventricle has low normal function. The left ventricle has no regional  wall motion abnormalities.   2. Right ventricular systolic function is normal. The right ventricular  size is normal.   3. Left atrial size was mild to moderately dilated. No left atrial/left  atrial appendage thrombus was detected. The LAA emptying velocity was 39  cm/s.   4. Right atrial size was mildly dilated.   5. The mitral valve is grossly normal. Mild mitral valve regurgitation.  No evidence of mitral stenosis.   6. Tricuspid valve regurgitation is mild to moderate.   7. The aortic valve is tricuspid. Aortic valve regurgitation is mild. No  aortic stenosis is present.   8. There is Severe (Grade IV) layered plaque involving the descending  aorta.  - Conclusion(s)/Recommendation(s): No LA/LAA thrombus identified. Successful  cardioversion performed with restoration of normal sinus rhythm.   Echo/TTE 06/05/22: IMPRESSIONS  1. Left ventricular ejection fraction, by estimation, is 55 to 60%. The  left ventricle has normal function. The left ventricle has no regional  wall motion abnormalities. There is mild concentric left ventricular  hypertrophy. Left ventricular diastolic function could not be evaluated.   2. Right ventricular systolic function is normal. The right ventricular  size is normal. There is mildly elevated pulmonary artery systolic  pressure. The estimated right ventricular systolic pressure is 44.2 mmHg.   3. Left atrial size was moderately dilated.   4. Right atrial size was mildly dilated.   5. The mitral valve is grossly normal. Trivial mitral valve  regurgitation. No evidence of mitral  stenosis.   6. The aortic valve is tricuspid. Aortic valve regurgitation is not  visualized. No aortic stenosis is present.   7. The inferior vena cava is dilated in size with <50% respiratory  variability, suggesting right atrial pressure of 15 mmHg.     Past Medical History:  Diagnosis Date   Anemia    Cancer (HCC)    Chronic kidney disease    Dysrhythmia    PAF 05/30/22   GERD (gastroesophageal reflux disease)    Neurogenic bladder     Past Surgical History:  Procedure Laterality Date   AV FISTULA PLACEMENT Left 09/27/2020   Procedure: LEFT ARM ARTERIOVENOUS GRAFT PLACEMENT USING GORE-TEX 4-7MM X 45CM  VASCULAR GRAFT;  Surgeon: Larina Earthly, MD;  Location: [facility] ORS;  Service: Vascular;  Laterality: Left;   CAPD INSERTION N/A 03/17/2021   Procedure: ATTEMPTED INSERTION OF LAPAROSCOPIC CONTINUOUS AMBULATORY PERITONEAL DIALYSIS (CAPD) CATHETER;  Surgeon: Maeola Harman, MD;  Location: [facility] OR;  Service: Vascular;  Laterality: N/A;   CARDIOVERSION N/A 06/19/2022   Procedure: CARDIOVERSION;  Surgeon: Sande Rives, MD;  Location: [facility] INVASIVE CV LAB;  Service: Cardiovascular;  Laterality: N/A;   CYSTOURETHROSCOPY     INSERTION OF DIALYSIS CATHETER Right 09/27/2020   Procedure: INSERTION OF PALINDROME TUNNELED DIALYSIS CATHETER  14.77f X 23CM;  Surgeon: Larina Earthly, MD;  Location: [facility]S;  Service: Vascular;  Laterality: Right;   INSERTION OF DIALYSIS CATHETER Right 05/31/2022   Procedure: INSERTION OF DIALYSIS CATHETER;  Surgeon: Victorino Sparrow, MD;  Location: [facility] OR;  Service: Vascular;  Laterality: Right;   IR REMOVAL TUN CV CATH W/O FL  06/20/2022   REMOVAL OF A DIALYSIS CATHETER N/A 12/06/2020   Procedure: MINOR REMOVAL OF A DIALYSIS CATHETER;  Surgeon: Larina Earthly, MD;  Location: [facility]S;  Service: Vascular;  Laterality: N/A;   SUPRAPUBIC CATHETER PLACEMENT     TEE WITHOUT CARDIOVERSION N/A 06/19/2022   Procedure: TRANSESOPHAGEAL ECHOCARDIOGRAM;  Surgeon: Sande Rives, MD;  Location: [facility] INVASIVE CV LAB;  Service: Cardiovascular;  Laterality: N/A;    MEDICATIONS: No current facility-administered medications for this encounter.    amiodarone (PACERONE) 200 MG tablet   apixaban (ELIQUIS) 2.5 MG TABS tablet   calcitRIOL (ROCALTROL) 0.5 MCG capsule   calcium acetate (PHOSLO) 667 MG capsule   loratadine (CLARITIN) 10 MG tablet   melatonin 3 MG TABS tablet   VELPHORO 500 MG chewable tablet   vitamin D3 (CHOLECALCIFEROL) 25 MCG tablet   Methoxy PEG-Epoetin Beta (MIRCERA IJ)   Nutritional Supplements (FEEDING SUPPLEMENT, NEPRO CARB STEADY,) LIQD    Shonna Chock, PA-C Surgical Short Stay/Anesthesiology [facility] Phone [phone] [facility] Phone [phone] 01/10/2023 11:24 AM

## 2023-01-11 ENCOUNTER — Ambulatory Visit (HOSPITAL_COMMUNITY): Payer: Medicare Other | Admitting: Vascular Surgery

## 2023-01-11 ENCOUNTER — Ambulatory Visit (HOSPITAL_COMMUNITY)
Admission: RE | Admit: 2023-01-11 | Discharge: 2023-01-11 | Disposition: A | Discharge status: Home / Self Care | Payer: Medicare Other | Attending: Vascular Surgery | Admitting: Vascular Surgery

## 2023-01-11 ENCOUNTER — Encounter (HOSPITAL_COMMUNITY)
Admission: RE | Disposition: A | Discharge status: Home / Self Care | Payer: Self-pay | Source: Home / Self Care | Attending: Vascular Surgery

## 2023-01-11 ENCOUNTER — Other Ambulatory Visit: Payer: Self-pay

## 2023-01-11 ENCOUNTER — Encounter (HOSPITAL_COMMUNITY): Payer: Self-pay | Admitting: Vascular Surgery

## 2023-01-11 DIAGNOSIS — I120 Hypertensive chronic kidney disease with stage 5 chronic kidney disease or end stage renal disease: Secondary | ICD-10-CM

## 2023-01-11 DIAGNOSIS — Z992 Dependence on renal dialysis: Secondary | ICD-10-CM | POA: Diagnosis not present

## 2023-01-11 DIAGNOSIS — D631 Anemia in chronic kidney disease: Secondary | ICD-10-CM | POA: Diagnosis not present

## 2023-01-11 DIAGNOSIS — Z7901 Long term (current) use of anticoagulants: Secondary | ICD-10-CM | POA: Diagnosis not present

## 2023-01-11 DIAGNOSIS — N2581 Secondary hyperparathyroidism of renal origin: Secondary | ICD-10-CM | POA: Diagnosis not present

## 2023-01-11 DIAGNOSIS — N185 Chronic kidney disease, stage 5: Secondary | ICD-10-CM

## 2023-01-11 DIAGNOSIS — I82712 Chronic embolism and thrombosis of superficial veins of left upper extremity: Secondary | ICD-10-CM | POA: Diagnosis not present

## 2023-01-11 DIAGNOSIS — Y832 Surgical operation with anastomosis, bypass or graft as the cause of abnormal reaction of the patient, or of later complication, without mention of misadventure at the time of the procedure: Secondary | ICD-10-CM | POA: Insufficient documentation

## 2023-01-11 DIAGNOSIS — N186 End stage renal disease: Secondary | ICD-10-CM

## 2023-01-11 DIAGNOSIS — I480 Paroxysmal atrial fibrillation: Secondary | ICD-10-CM | POA: Diagnosis not present

## 2023-01-11 DIAGNOSIS — T82868A Thrombosis of vascular prosthetic devices, implants and grafts, initial encounter: Secondary | ICD-10-CM | POA: Diagnosis not present

## 2023-01-11 DIAGNOSIS — D509 Iron deficiency anemia, unspecified: Secondary | ICD-10-CM | POA: Diagnosis not present

## 2023-01-11 HISTORY — DX: Malignant (primary) neoplasm, unspecified: C80.1

## 2023-01-11 HISTORY — PX: AV FISTULA PLACEMENT: SHX1204

## 2023-01-11 LAB — POCT I-STAT, CHEM 8
BUN: 51 mg/dL — ABNORMAL HIGH (ref 8–23)
Calcium, Ion: 1.1 mmol/L — ABNORMAL LOW (ref 1.15–1.40)
Chloride: 100 mmol/L (ref 98–111)
Creatinine, Ser: 7.9 mg/dL — ABNORMAL HIGH (ref 0.61–1.24)
Glucose, Bld: 82 mg/dL (ref 70–99)
HCT: 39 % (ref 39.0–52.0)
Hemoglobin: 13.3 g/dL (ref 13.0–17.0)
Potassium: 4.3 mmol/L (ref 3.5–5.1)
Sodium: 133 mmol/L — ABNORMAL LOW (ref 135–145)
TCO2: 24 mmol/L (ref 22–32)

## 2023-01-11 SURGERY — ARTERIOVENOUS (AV) FISTULA CREATION
Anesthesia: Regional | Site: Arm Upper | Laterality: Left

## 2023-01-11 MED ORDER — HEPARIN 6000 UNIT IRRIGATION SOLUTION
Status: DC | PRN
  Administered 2023-01-11: 1

## 2023-01-11 MED ORDER — 0.9 % SODIUM CHLORIDE (POUR BTL) OPTIME
TOPICAL | Status: DC | PRN
  Administered 2023-01-11: 1000 mL

## 2023-01-11 MED ORDER — DEXAMETHASONE SODIUM PHOSPHATE 10 MG/ML IJ SOLN
INTRAMUSCULAR | Status: DC | PRN
  Administered 2023-01-11: 10 mg via INTRAVENOUS

## 2023-01-11 MED ORDER — APIXABAN 2.5 MG PO TABS: 2.5000 mg | ORAL_TABLET | Freq: Two times a day (BID) | ORAL | Status: DC

## 2023-01-11 MED ORDER — CEFAZOLIN SODIUM-DEXTROSE 2-3 GM-%(50ML) IV SOLR
INTRAVENOUS | Status: DC | PRN
  Administered 2023-01-11: 2 g via INTRAVENOUS

## 2023-01-11 MED ORDER — PROPOFOL 500 MG/50ML IV EMUL
INTRAVENOUS | Status: DC | PRN
  Administered 2023-01-11: 125 ug/kg/min via INTRAVENOUS
  Administered 2023-01-11: 50 mg via INTRAVENOUS

## 2023-01-11 MED ORDER — PHENYLEPHRINE HCL-NACL 20-0.9 MG/250ML-% IV SOLN
INTRAVENOUS | Status: DC | PRN
  Administered 2023-01-11: 15 ug/min via INTRAVENOUS

## 2023-01-11 MED ORDER — FENTANYL CITRATE (PF) 250 MCG/5ML IJ SOLN
INTRAMUSCULAR | Status: AC
  Filled 2023-01-11: qty 5

## 2023-01-11 MED ORDER — ONDANSETRON HCL 4 MG/2ML IJ SOLN
INTRAMUSCULAR | Status: DC | PRN
  Administered 2023-01-11: 4 mg via INTRAVENOUS

## 2023-01-11 MED ORDER — LIDOCAINE 2% (20 MG/ML) 5 ML SYRINGE
INTRAMUSCULAR | Status: AC
  Filled 2023-01-11: qty 5

## 2023-01-11 MED ORDER — SODIUM CHLORIDE 0.9 % IV SOLN: INTRAVENOUS | Status: DC

## 2023-01-11 MED ORDER — PROPOFOL 1000 MG/100ML IV EMUL
INTRAVENOUS | Status: AC
  Filled 2023-01-11: qty 100

## 2023-01-11 MED ORDER — SODIUM CHLORIDE 0.9 % IV SOLN: INTRAVENOUS | Status: DC | PRN

## 2023-01-11 MED ORDER — OXYCODONE-ACETAMINOPHEN 5-325 MG PO TABS: 1.0000 | ORAL_TABLET | Freq: Four times a day (QID) | ORAL | 0 refills | Status: DC | PRN

## 2023-01-11 MED ORDER — CHLORHEXIDINE GLUCONATE 0.12 % MT SOLN
15.0000 mL | Freq: Once | OROMUCOSAL | Status: AC
Start: 2023-01-11 — End: 2023-01-11

## 2023-01-11 MED ORDER — LIDOCAINE 2% (20 MG/ML) 5 ML SYRINGE
INTRAMUSCULAR | Status: DC | PRN
  Administered 2023-01-11: 50 mg via INTRAVENOUS

## 2023-01-11 MED ORDER — ONDANSETRON HCL 4 MG/2ML IJ SOLN
INTRAMUSCULAR | Status: AC
  Filled 2023-01-11: qty 2

## 2023-01-11 MED ORDER — DEXAMETHASONE SODIUM PHOSPHATE 10 MG/ML IJ SOLN
INTRAMUSCULAR | Status: AC
  Filled 2023-01-11: qty 1

## 2023-01-11 MED ORDER — FENTANYL CITRATE (PF) 250 MCG/5ML IJ SOLN
INTRAMUSCULAR | Status: DC | PRN
  Administered 2023-01-11: 25 ug via INTRAVENOUS
  Administered 2023-01-11: 50 ug via INTRAVENOUS
  Administered 2023-01-11: 25 ug via INTRAVENOUS

## 2023-01-11 MED ORDER — CHLORHEXIDINE GLUCONATE 0.12 % MT SOLN
OROMUCOSAL | Status: AC
  Administered 2023-01-11: 15 mL via OROMUCOSAL
  Filled 2023-01-11: qty 15

## 2023-01-11 MED ORDER — ORAL CARE MOUTH RINSE: 15.0000 mL | Freq: Once | OROMUCOSAL | Status: AC

## 2023-01-11 MED ORDER — CEFAZOLIN SODIUM-DEXTROSE 2-4 GM/100ML-% IV SOLN
INTRAVENOUS | Status: AC
  Filled 2023-01-11: qty 100

## 2023-01-11 SURGICAL SUPPLY — 25 items
ARMBAND PINK RESTRICT EXTREMIT (MISCELLANEOUS) ×1 IMPLANT
BAG COUNTER SPONGE SURGICOUNT (BAG) ×1 IMPLANT
CANISTER SUCT 3000ML PPV (MISCELLANEOUS) ×1 IMPLANT
CLIP LIGATING EXTRA MED SLVR (CLIP) ×1 IMPLANT
CLIP LIGATING EXTRA SM BLUE (MISCELLANEOUS) ×1 IMPLANT
COVER PROBE W GEL 5X96 (DRAPES) IMPLANT
DERMABOND ADVANCED .7 DNX12 (GAUZE/BANDAGES/DRESSINGS) ×1 IMPLANT
ELECT REM PT RETURN 9FT ADLT (ELECTROSURGICAL) ×1
ELECTRODE REM PT RTRN 9FT ADLT (ELECTROSURGICAL) ×1 IMPLANT
GLOVE BIO SURGEON STRL SZ7.5 (GLOVE) ×1 IMPLANT
GOWN STRL REUS W/ TWL LRG LVL3 (GOWN DISPOSABLE) ×2 IMPLANT
GOWN STRL REUS W/ TWL XL LVL3 (GOWN DISPOSABLE) ×1 IMPLANT
INSERT FOGARTY SM (MISCELLANEOUS) IMPLANT
KIT BASIN OR (CUSTOM PROCEDURE TRAY) ×1 IMPLANT
KIT TURNOVER KIT B (KITS) ×1 IMPLANT
NS IRRIG 1000ML POUR BTL (IV SOLUTION) ×1 IMPLANT
PACK CV ACCESS (CUSTOM PROCEDURE TRAY) ×1 IMPLANT
PAD ARMBOARD 7.5X6 YLW CONV (MISCELLANEOUS) ×2 IMPLANT
SLING ARM FOAM STRAP LRG (SOFTGOODS) IMPLANT
SUT MNCRL AB 4-0 PS2 18 (SUTURE) ×1 IMPLANT
SUT PROLENE 6 0 BV (SUTURE) ×1 IMPLANT
SUT VIC AB 3-0 SH 27X BRD (SUTURE) ×1 IMPLANT
TOWEL GREEN STERILE (TOWEL DISPOSABLE) ×1 IMPLANT
UNDERPAD 30X36 HEAVY ABSORB (UNDERPADS AND DIAPERS) ×1 IMPLANT
WATER STERILE IRR 1000ML POUR (IV SOLUTION) ×1 IMPLANT

## 2023-01-11 NOTE — Anesthesia Procedure Notes (Signed)
Anesthesia Regional Block: Supraclavicular block   Pre-Anesthetic Checklist: , timeout performed,  Correct Patient, Correct Site, Correct Laterality,  Correct Procedure, Correct Position, site marked,  Risks and benefits discussed,  Surgical consent,  Pre-op evaluation,  At surgeon's request and post-op pain management  Laterality: Left  Prep: chloraprep       Needles:  Injection technique: Single-shot  Needle Type: Echogenic Needle     Needle Length: 9cm  Needle Gauge: 21     Additional Needles:   Procedures:,,,, ultrasound used (permanent image in chart),,    Narrative:  Start time: 01/11/2023 7:12 AM End time: 01/11/2023 7:20 AM Injection made incrementally with aspirations every 5 mL.  Performed by: Personally  Anesthesiologist: Collene Schlichter, MD  Additional Notes: No pain on injection. No increased resistance to injection. Injection made in 5cc increments.  Good needle visualization.  Patient tolerated procedure well.

## 2023-01-11 NOTE — Op Note (Signed)
Patient name: Glenn Olson MRN: [id number] DOB: 01-Jun-1940 Sex: male  01/11/2023 Pre-operative Diagnosis: ESRD Post-operative diagnosis:  Same Surgeon:  Apolinar Junes C. Randie Heinz, MD Assistant: Doreatha Massed, PA Procedure Performed:  Left brachial artery to cephalic vein fistula creation  Indications: 82 year old male with end-stage renal disease currently dialyzing via tunneled dialysis catheter.  He has a history of a left arm forearm loop graft which is known to be thrombosed and also has undergone attempted PD catheter which was aborted.  He is now indicated for left upper extremity fistula versus graft.  Experience assistant was necessary to facilitate exposure of both the vein and the artery as well as ligation and excision of the existing graft and performing anastomosis between the vein and the artery.  Findings: There was dense scar tissue with graft within the wound.  There was a large cephalic vein measuring at least 4 mm.  The existing venous limb of the graft was excised and with chronically thrombosed as well as partial Viabahn stent was also excised.  The artery was diseased and the new anastomosis was created proximally to the old arterial anastomosis.  At completion there was a very strong thrill throughout the upper arm and a weakly palpable radial artery pulse at the wrist.   Procedure:  The patient was identified in the holding area and taken to the operating room where specified operative table and MAC anesthesia was induced.  He was sterilely prepped and draped in the left upper extremity usual fashion, antibiotics were administered and a timeout was called.  We began using ultrasound to identify what appeared to be a suitable cephalic vein, there was no suitable basilic vein.  There was a graft with a stent that appeared in the venous limb of the antecubitum.  The block was tested and had to be intact.  A transverse incision was created incorporating the old graft creation  incision.  We dissected down first identified the cephalic vein and marked this for orientation and dissected it free for several centimeters.  Medially we then dissected to the level of the graft and it became evident that the graft would need to be excised to expose the artery.  We exposed this for several centimeters from the graft off distally in the arm and then proximally divided the Viabahn stent.  There was no bleeding proximally or distally.  We did oversew the Viabahn stent with a figure-of-eight 3-0 Vicryl suture.  I deemed too risky to fully expose the brachial artery so we planned to just clamped proximally and distally.  Prior to this we transected the vein distally and spatulated and flushed with heparinized saline and clamped it.  The artery was then clamped distally and proximally opened longitudinally.  There was significant hyperplasia within the artery and this was somewhat removed.  There was very strong inflow.  The vein was then sewn into side with 6-0 Prolene suture.  Prior completion without flushing all directions.  Upon completion there was a very strong thrill in the vein and a weakly palpable radial artery pulse with compression of the fistula there was a strong radial pulse this was confirmed with Doppler.  We freed up some of the soft tissue around the vein.  We then closed the subcutaneous tissue with interrupted 3-0 Vicryl suture given the tissue was very weak.  Skin was then closed with 4-0 Monocryl and all loss placed at the skin level.  The patient was then awakened from anesthesia having tolerated the procedure well  without any complication.  All counts were correct at completion.  EBL: 25cc   [name] C. Randie Heinz, MD [facility] [place] Office: [phone] Pager: [phone]

## 2023-01-11 NOTE — Discharge Instructions (Signed)
   [facility]  Discharge Instructions  AV Fistula or Graft Surgery for Dialysis Access  Please refer to the following instructions for your post-procedure care. Your surgeon or physician assistant will discuss any changes with you.  Activity  You may drive the day following your surgery, if you are comfortable and no longer taking prescription pain medication. Resume full activity as the soreness in your incision resolves.  Bathing/Showering  You may shower after you go home. Keep your incision dry for 48 hours. Do not soak in a bathtub, hot tub, or swim until the incision heals completely. You may not shower if you have a hemodialysis catheter.  Incision Care  Clean your incision with mild soap and water after 48 hours. Pat the area dry with a clean towel. You do not need a bandage unless otherwise instructed. Do not apply any ointments or creams to your incision. You may have skin glue on your incision. Do not peel it off. It will come off on its own in about one week. Your arm may swell a bit after surgery. To reduce swelling use pillows to elevate your arm so it is above your heart. Your doctor will tell you if you need to lightly wrap your arm with an ACE bandage.  Diet  Resume your normal diet. There are not special food restrictions following this procedure. In order to heal from your surgery, it is CRITICAL to get adequate nutrition. Your body requires vitamins, minerals, and protein. Vegetables are the best source of vitamins and minerals. Vegetables also provide the perfect balance of protein. Processed food has little nutritional value, so try to avoid this.  Medications  Resume taking all of your medications. If your incision is causing pain, you may take over-the counter pain relievers such as acetaminophen (Tylenol). If you were prescribed a stronger pain medication, please be aware these medications can cause nausea and constipation. Prevent  nausea by taking the medication with a snack or meal. Avoid constipation by drinking plenty of fluids and eating foods with high amount of fiber, such as fruits, vegetables, and grains.  Do not take Tylenol if you are taking prescription pain medications.  Follow up Your surgeon may want to see you in the office following your access surgery. If so, this will be arranged at the time of your surgery.  Please call us immediately for any of the following conditions:  Increased pain, redness, drainage (pus) from your incision site Fever of 101 degrees or higher Severe or worsening pain at your incision site Hand pain or numbness.  Reduce your risk of vascular disease:  Stop smoking. If you would like help, call [facility] at 1-800-QUIT-NOW (678 177 2400) or [facility] at 339-505-0444  Manage your cholesterol Maintain a desired weight Control your diabetes Keep your blood pressure down  Dialysis  It will take several weeks to several months for your new dialysis access to be ready for use. Your surgeon will determine when it is okay to use it. Your nephrologist will continue to direct your dialysis. You can continue to use your Permcath until your new access is ready for use.   01/11/2023 Glenn Olson [id number] 1940/10/27  Surgeon(s): Maeola Harman, MD  Procedure(s): Creation left brachiocephalic AV fistula  x Do not stick fistula for 12 weeks    If you have any questions, please call the office at [phone].

## 2023-01-11 NOTE — Transfer of Care (Signed)
Immediate Anesthesia Transfer of Care Note  Patient: Glenn Olson  Procedure(s) Performed: LEFT ARM ARTERIOVENOUS (AV) FISTULA CREATION (Left: Arm Upper)  Patient Location: PACU  Anesthesia Type:MAC and Regional  Level of Consciousness: drowsy  Airway & Oxygen Therapy: Patient Spontanous Breathing and Patient connected to face mask oxygen  Post-op Assessment: Report given to RN and Post -op Vital signs reviewed and stable  Post vital signs: Reviewed and stable  Last Vitals:  Vitals Value Taken Time  BP 120/57 01/11/23 0856  Temp 97.3   Pulse 60 01/11/23 0858  Resp 15 01/11/23 0858  SpO2 100 % 01/11/23 0858  Vitals shown include unfiled device data.  Last Pain:  Vitals:   01/11/23 0612  TempSrc:   PainSc: 0-No pain         Complications: No notable events documented.

## 2023-01-11 NOTE — H&P (Signed)
HPI:   Glenn Olson is a 82 y.o. 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  This recently thrombosed he is currently dialyzing via right IJ catheter.  Patient is right-hand dominant no other upper extremity surgeries in the past.  He did have attempted placement of PD catheter but this was abandoned due to intra-abdominal adhesions and he recovered well from that.  He does take Eliquis for atrial fibrillation but states that he is usually in normal rhythm.  Currently dialyzes Tuesdays Thursdays and Saturdays.  Patient walks with the help of a cane.       Past Medical History:  Diagnosis Date   Anemia     Chronic kidney disease     Dysrhythmia     GERD (gastroesophageal reflux disease)     Neurogenic bladder          No family history on file.          Past Surgical History:  Procedure Laterality Date   AV FISTULA PLACEMENT Left 09/27/2020    Procedure: LEFT ARM ARTERIOVENOUS GRAFT PLACEMENT USING GORE-TEX 4-7MM X 45CM  VASCULAR GRAFT;  Surgeon: Larina Earthly, MD;  Location: [facility];  Service: Vascular;  Laterality: Left;   CAPD INSERTION N/A 03/17/2021    Procedure: ATTEMPTED INSERTION OF LAPAROSCOPIC CONTINUOUS AMBULATORY PERITONEAL DIALYSIS (CAPD) CATHETER;  Surgeon: Maeola Harman, MD;  Location: [facility] OR;  Service: Vascular;  Laterality: N/A;   CARDIOVERSION N/A 06/19/2022    Procedure: CARDIOVERSION;  Surgeon: Sande Rives, MD;  Location: [facility] INVASIVE CV LAB;  Service: Cardiovascular;  Laterality: N/A;   CYSTOURETHROSCOPY       INSERTION OF DIALYSIS CATHETER Right 09/27/2020    Procedure: INSERTION OF PALINDROME TUNNELED DIALYSIS CATHETER 14.39f X 23CM;  Surgeon: Larina Earthly, MD;  Location: [facility];  Service: Vascular;  Laterality: Right;   INSERTION OF DIALYSIS CATHETER Right 05/31/2022    Procedure: INSERTION OF DIALYSIS CATHETER;  Surgeon: Victorino Sparrow, MD;  Location: [facility];  Service:  Vascular;  Laterality: Right;   IR REMOVAL TUN CV CATH W/O FL   06/20/2022   REMOVAL OF A DIALYSIS CATHETER N/A 12/06/2020    Procedure: MINOR REMOVAL OF A DIALYSIS CATHETER;  Surgeon: Larina Earthly, MD;  Location: [facility];  Service: Vascular;  Laterality: N/A;   SUPRAPUBIC CATHETER PLACEMENT       TEE WITHOUT CARDIOVERSION N/A 06/19/2022    Procedure: TRANSESOPHAGEAL ECHOCARDIOGRAM;  Surgeon: Sande Rives, MD;  Location: [facility] INVASIVE CV LAB;  Service: Cardiovascular;  Laterality: N/A;          Short Social History:  Social History         Tobacco Use   Smoking status: Never   Smokeless tobacco: Never   Tobacco comments:      Never smoked 09/26/22  Substance Use Topics   Alcohol use: Yes      Comment: patient states he is a social drinker      Allergies  No Known Allergies           Current Outpatient Medications  Medication Sig Dispense Refill   amiodarone (PACERONE) 200 MG tablet Take 1 tablet (200 mg total) by mouth daily. 90 tablet 2   apixaban (ELIQUIS) 2.5 MG TABS tablet Take 1 tablet (2.5 mg total) by mouth 2 (two) times daily. 180 tablet 1   calcium acetate (PHOSLO) 667 MG capsule Take 1 capsule (  667 mg total) by mouth 3 (three) times daily with meals. 90 capsule 0   loratadine (CLARITIN) 10 MG tablet Take 1 tablet (10 mg total) by mouth daily. 30 tablet 0   melatonin 3 MG TABS tablet Take 1 tablet (3 mg total) by mouth at bedtime as needed. 30 tablet 0   Methoxy PEG-Epoetin Beta (MIRCERA IJ) Mircera       Nutritional Supplements (FEEDING SUPPLEMENT, NEPRO CARB STEADY,) LIQD Take 237 mLs by mouth 2 (two) times daily between meals.   0   VELPHORO 500 MG chewable tablet Chew 500 mg by mouth 3 (three) times daily.       vitamin D3 (CHOLECALCIFEROL) 25 MCG tablet Take 1 tablet (1,000 Units total) by mouth daily. 30 tablet 0   calcitRIOL (ROCALTROL) 0.5 MCG capsule Take 1 capsule (0.5 mcg total) by mouth daily. (Patient not taking: Reported on 11/21/2022) 30 capsule 0       No current facility-administered medications for this visit.        Review of Systems  Constitutional:  Constitutional negative. HENT: HENT negative.  Eyes: Eyes negative.  Cardiovascular: Cardiovascular negative.  GI: Gastrointestinal negative.  Musculoskeletal: Musculoskeletal negative.  Skin:       Thin skin Neurological: Neurological negative. Hematologic: Positive for bruises/bleeds easily.          Objective:    Vitals:   01/11/23 0559  BP: 104/68  Pulse: 77  Resp: 20  Temp: 97.7 F (36.5 C)  SpO2: 99%      Physical Exam HENT:     Head: Normocephalic.     Mouth/Throat:     Mouth: Mucous membranes are moist.  Eyes:     Pupils: Pupils are equal, round, and reactive to light.  Cardiovascular:     Pulses:          Radial pulses are 2+ on the right side and 2+ on the left side.  Pulmonary:     Effort: Pulmonary effort is normal.  Abdominal:     General: Abdomen is flat.  Musculoskeletal:        General: Normal range of motion.     Right lower leg: No edema.     Left lower leg: No edema.  Skin:    General: Skin is warm.     Capillary Refill: Capillary refill takes less than 2 seconds.  Neurological:     General: No focal deficit present.     Mental Status: He is alert.  Psychiatric:        Mood and Affect: Mood normal.        Thought Content: Thought content normal.        Judgment: Judgment normal.        Data: Right Pre-Dialysis Findings:  +-----------------------+----------+--------------------+---------+--------  +  Location              PSV (cm/s)Intralum. Diam. (cm)Waveform  Comments  +-----------------------+----------+--------------------+---------+--------  +  Brachial Antecub. fossa53        0.60                triphasic           +-----------------------+----------+--------------------+---------+--------  +  Radial Art at Wrist    52        0.28                triphasic            +-----------------------+----------+--------------------+---------+--------  +  Ulnar Art at Wrist     42  0.21                triphasic           +-----------------------+----------+--------------------+---------+--------  +    Left Pre-Dialysis Findings:  +-----------------------+----------+--------------------+---------+--------  +  Location              PSV (cm/s)Intralum. Diam. (cm)Waveform  Comments  +-----------------------+----------+--------------------+---------+--------  +  Brachial Antecub. fossa37        0.72                triphasic           +-----------------------+----------+--------------------+---------+--------  +  Radial Art at Wrist    52        0.22                triphasic           +-----------------------+----------+--------------------+---------+--------  +  Ulnar Art at Wrist     64        0.20                triphasic           +-----------------------+----------+--------------------+---------+--------    Right Cephalic   Diameter (cm)Depth (cm)Findings   +-----------------+-------------+----------+---------+  Prox upper arm       0.27        0.95              +-----------------+-------------+----------+---------+  Mid upper arm        0.26        0.37              +-----------------+-------------+----------+---------+  Dist upper arm       0.26        0.40              +-----------------+-------------+----------+---------+  Antecubital fossa    0.35        0.29              +-----------------+-------------+----------+---------+  Prox forearm         0.20        0.25   branching  +-----------------+-------------+----------+---------+  Mid forearm          0.18        0.32              +-----------------+-------------+----------+---------+  Dist forearm         0.16        0.10              +-----------------+-------------+----------+---------+    +-----------------+-------------+----------+--------+  Right Basilic    Diameter (cm)Depth (cm)Findings  +-----------------+-------------+----------+--------+  Prox upper arm       0.48        1.01             +-----------------+-------------+----------+--------+  Mid upper arm        0.47        0.83             +-----------------+-------------+----------+--------+  Dist upper arm       0.42        1.00             +-----------------+-------------+----------+--------+  Antecubital fossa    0.42        0.73             +-----------------+-------------+----------+--------+  Prox forearm         0.31        0.20             +-----------------+-------------+----------+--------+  Mid forearm          0.18        0.23             +-----------------+-------------+----------+--------+  Distal forearm       0.16        0.15             +-----------------+-------------+----------+--------+   +-----------------+-------------+----------+---------+  Left Cephalic    Diameter (cm)Depth (cm)Findings   +-----------------+-------------+----------+---------+  Prox upper arm       0.40        0.39              +-----------------+-------------+----------+---------+  Mid upper arm        0.36        0.27   branching  +-----------------+-------------+----------+---------+  Dist upper arm       0.46        0.26              +-----------------+-------------+----------+---------+  Antecubital fossa    0.29        0.40   branching  +-----------------+-------------+----------+---------+  Prox forearm         0.21        0.22              +-----------------+-------------+----------+---------+  Mid forearm          0.16        0.23              +-----------------+-------------+----------+---------+  Dist forearm         0.10        0.13              +-----------------+-------------+----------+---------+    +-----------------+-------------+----------+--------------+  Left Basilic     Diameter (cm)Depth (cm)   Findings     +-----------------+-------------+----------+--------------+  Prox upper arm       0.34        0.88                   +-----------------+-------------+----------+--------------+  Mid upper arm        0.35        0.84                   +-----------------+-------------+----------+--------------+  Dist upper arm       0.33        0.62                   +-----------------+-------------+----------+--------------+  Antecubital fossa    0.20        0.37                   +-----------------+-------------+----------+--------------+  Prox forearm         0.07        0.20                   +-----------------+-------------+----------+--------------+  Mid forearm                             not visualized  +-----------------+-------------+----------+--------------+  Distal forearm                          not visualized  +-----------------+-------------+----------+--------------+       Assessment/Plan:   82 year old male currently dialyzing Tuesdays, Thursdays and Saturdays via right IJ tunneled dialysis catheter with a thrombosed left forearm AV graft.  We have discussed proceeding with new upper extremity access and patient would prefer staying in the left arm where he has possibly a suitable basilic vein above the antecubital may be a suitable cephalic vein by think this would be difficult to create a fistula given the placement of the previous AV graft.  Will proceed with possible fistula versus more likely upper arm AV graft today in the OR. We have discussed r/b/a and he demonstrates good understanding.   Shraga Custard C. Randie Heinz, MD [facility] Office: [phone] Pager: [phone]

## 2023-01-13 ENCOUNTER — Encounter (HOSPITAL_COMMUNITY): Payer: Self-pay | Admitting: Vascular Surgery

## 2023-01-14 DIAGNOSIS — D631 Anemia in chronic kidney disease: Secondary | ICD-10-CM | POA: Diagnosis not present

## 2023-01-14 DIAGNOSIS — N2581 Secondary hyperparathyroidism of renal origin: Secondary | ICD-10-CM | POA: Diagnosis not present

## 2023-01-14 DIAGNOSIS — N186 End stage renal disease: Secondary | ICD-10-CM | POA: Diagnosis not present

## 2023-01-14 DIAGNOSIS — D509 Iron deficiency anemia, unspecified: Secondary | ICD-10-CM | POA: Diagnosis not present

## 2023-01-14 DIAGNOSIS — Z992 Dependence on renal dialysis: Secondary | ICD-10-CM | POA: Diagnosis not present

## 2023-01-14 NOTE — Anesthesia Postprocedure Evaluation (Signed)
Anesthesia Post Note  Patient: Glenn Olson  Procedure(s) Performed: LEFT ARM ARTERIOVENOUS (AV) FISTULA CREATION (Left: Arm Upper)     Patient location during evaluation: PACU Anesthesia Type: Regional Level of consciousness: awake and alert Pain management: pain level controlled Vital Signs Assessment: post-procedure vital signs reviewed and stable Respiratory status: spontaneous breathing, nonlabored ventilation and respiratory function stable Cardiovascular status: stable and blood pressure returned to baseline Postop Assessment: no apparent nausea or vomiting Anesthetic complications: no   No notable events documented.  Last Vitals:  Vitals:   01/11/23 0945 01/11/23 0946  BP: (!) 118/45 (!) 118/45  Pulse: 60 60  Resp: 15 16  Temp:  (!) 36.4 C  SpO2: 97% 97%    Last Pain:  Vitals:   01/11/23 0946  TempSrc:   PainSc: 0-No pain                 Collene Schlichter

## 2023-01-15 ENCOUNTER — Emergency Department (HOSPITAL_COMMUNITY)
Admission: EM | Admit: 2023-01-15 | Discharge: 2023-01-15 | Disposition: A | Discharge status: Home / Self Care | Payer: Medicare Other | Attending: Emergency Medicine | Admitting: Emergency Medicine

## 2023-01-15 ENCOUNTER — Other Ambulatory Visit: Payer: Self-pay

## 2023-01-15 ENCOUNTER — Encounter (HOSPITAL_COMMUNITY): Payer: Self-pay

## 2023-01-15 ENCOUNTER — Emergency Department (HOSPITAL_COMMUNITY): Payer: Medicare Other

## 2023-01-15 DIAGNOSIS — R0789 Other chest pain: Secondary | ICD-10-CM | POA: Insufficient documentation

## 2023-01-15 DIAGNOSIS — Z79899 Other long term (current) drug therapy: Secondary | ICD-10-CM | POA: Insufficient documentation

## 2023-01-15 DIAGNOSIS — N2581 Secondary hyperparathyroidism of renal origin: Secondary | ICD-10-CM | POA: Diagnosis not present

## 2023-01-15 DIAGNOSIS — R0602 Shortness of breath: Secondary | ICD-10-CM | POA: Diagnosis not present

## 2023-01-15 DIAGNOSIS — I4891 Unspecified atrial fibrillation: Secondary | ICD-10-CM | POA: Diagnosis not present

## 2023-01-15 DIAGNOSIS — R778 Other specified abnormalities of plasma proteins: Secondary | ICD-10-CM | POA: Diagnosis not present

## 2023-01-15 DIAGNOSIS — I517 Cardiomegaly: Secondary | ICD-10-CM | POA: Diagnosis not present

## 2023-01-15 DIAGNOSIS — N186 End stage renal disease: Secondary | ICD-10-CM | POA: Insufficient documentation

## 2023-01-15 DIAGNOSIS — Z992 Dependence on renal dialysis: Secondary | ICD-10-CM | POA: Diagnosis not present

## 2023-01-15 DIAGNOSIS — R079 Chest pain, unspecified: Secondary | ICD-10-CM | POA: Diagnosis not present

## 2023-01-15 DIAGNOSIS — D509 Iron deficiency anemia, unspecified: Secondary | ICD-10-CM | POA: Diagnosis not present

## 2023-01-15 DIAGNOSIS — Z7901 Long term (current) use of anticoagulants: Secondary | ICD-10-CM | POA: Insufficient documentation

## 2023-01-15 DIAGNOSIS — D631 Anemia in chronic kidney disease: Secondary | ICD-10-CM | POA: Diagnosis not present

## 2023-01-15 DIAGNOSIS — I700 Atherosclerosis of aorta: Secondary | ICD-10-CM | POA: Diagnosis not present

## 2023-01-15 DIAGNOSIS — R0989 Other specified symptoms and signs involving the circulatory and respiratory systems: Secondary | ICD-10-CM | POA: Diagnosis not present

## 2023-01-15 LAB — CBC
HCT: 39.8 % (ref 39.0–52.0)
Hemoglobin: 12.9 g/dL — ABNORMAL LOW (ref 13.0–17.0)
MCH: 30.1 pg (ref 26.0–34.0)
MCHC: 32.4 g/dL (ref 30.0–36.0)
MCV: 93 fL (ref 80.0–100.0)
Platelets: 134 10*3/uL — ABNORMAL LOW (ref 150–400)
RBC: 4.28 MIL/uL (ref 4.22–5.81)
RDW: 15.5 % (ref 11.5–15.5)
WBC: 8.1 10*3/uL (ref 4.0–10.5)
nRBC: 0 % (ref 0.0–0.2)

## 2023-01-15 LAB — BASIC METABOLIC PANEL
Anion gap: 12 (ref 5–15)
BUN: 51 mg/dL — ABNORMAL HIGH (ref 8–23)
CO2: 28 mmol/L (ref 22–32)
Calcium: 9.3 mg/dL (ref 8.9–10.3)
Chloride: 93 mmol/L — ABNORMAL LOW (ref 98–111)
Creatinine, Ser: 6.61 mg/dL — ABNORMAL HIGH (ref 0.61–1.24)
GFR, Estimated: 8 mL/min — ABNORMAL LOW (ref >60)
Glucose, Bld: 99 mg/dL (ref 70–99)
Potassium: 4.3 mmol/L (ref 3.5–5.1)
Sodium: 133 mmol/L — ABNORMAL LOW (ref 135–145)

## 2023-01-15 LAB — TROPONIN I (HIGH SENSITIVITY)
Troponin I (High Sensitivity): 75 ng/L — ABNORMAL HIGH (ref <18)
Troponin I (High Sensitivity): 67 ng/L — ABNORMAL HIGH (ref <18)

## 2023-01-15 NOTE — ED Triage Notes (Signed)
Per EMS   Chest pressure Ongoing for 1 week Happens when in dialysis

## 2023-01-15 NOTE — Discharge Instructions (Addendum)
Call your primary care provider to arrange follow-up appointment.  Please return to the emergency department for any new or worsening symptoms.

## 2023-01-15 NOTE — ED Provider Notes (Signed)
[place] EMERGENCY DEPARTMENT AT [facility] Provider Note   CSN: [id number] Arrival date & time: 01/15/23  1127     History  Chief Complaint  Patient presents with   Chest Pain    Cardell Tae is a 82 y.o. male.   Chest Pain Associated symptoms: no abdominal pain, no back pain, no cough, no dizziness, no fever, no nausea, no numbness, no palpitations, no shortness of breath, no vomiting and no weakness        Burns Skluzacek is a 82 y.o. male with past medical history of end-stage renal disease on hemodialysis getting ready to start home dialysis, also history of A-fib/flutter and has chronic indwelling Foley catheter who presents to the Emergency Department complaining of chest pressure and shortness of breath during dialysis this morning.  He was approximately 45 minutes into his treatment when he began having midsternal pressure.  States he had some shortness of breath as well.  His dialysis treatment was stopped and EMS was contacted.  Patient's family member who is at bedside states that he was in "irregular rhythm" patient states his symptoms lasted approximately 30 minutes before spontaneously resolving.  He was brought here for evaluation.  He had similar symptoms in October of this year.  He is currently on Eliquis and amiodarone.   Patient was here in October with similar episode of chest pain while on dialysis.  Home Medications Prior to Admission medications   Medication Sig Start Date End Date Taking? Authorizing Provider  amiodarone (PACERONE) 200 MG tablet Take 1 tablet (200 mg total) by mouth daily. 11/16/22   Fenton, Clint R, PA  apixaban (ELIQUIS) 2.5 MG TABS tablet Take 1 tablet (2.5 mg total) by mouth 2 (two) times daily. 01/12/23   Rhyne, Ames Coupe, PA-C  calcitRIOL (ROCALTROL) 0.5 MCG capsule Take 1 capsule (0.5 mcg total) by mouth daily. 06/28/22   Love, Evlyn Kanner, PA-C  calcium acetate (PHOSLO) 667 MG capsule Take 1 capsule (667 mg total) by  mouth 3 (three) times daily with meals. 06/28/22   Love, Evlyn Kanner, PA-C  loratadine (CLARITIN) 10 MG tablet Take 1 tablet (10 mg total) by mouth daily. 06/29/22   Love, Evlyn Kanner, PA-C  melatonin 3 MG TABS tablet Take 1 tablet (3 mg total) by mouth at bedtime as needed. Patient taking differently: Take 3 mg by mouth at bedtime as needed (Sleep). 06/28/22   Love, Evlyn Kanner, PA-C  Methoxy PEG-Epoetin Beta (MIRCERA IJ) Mircera 11/03/22 11/02/23  [provider]  Nutritional Supplements (FEEDING SUPPLEMENT, NEPRO CARB STEADY,) LIQD Take 237 mLs by mouth 2 (two) times daily between meals. Patient not taking: Reported on 01/09/2023 06/20/22   Lanae Boast, MD  oxyCODONE-acetaminophen (PERCOCET) 5-325 MG tablet Take 1 tablet by mouth every 6 (six) hours as needed for severe pain (pain score 7-10). 01/11/23   Rhyne, Ames Coupe, PA-C  VELPHORO 500 MG chewable tablet Chew 500 mg by mouth 3 (three) times daily. 10/30/22   [provider]  vitamin D3 (CHOLECALCIFEROL) 25 MCG tablet Take 1 tablet (1,000 Units total) by mouth daily. 06/29/22   Jacquelynn Cree, PA-C      Allergies    Patient has no known allergies.    Review of Systems   Review of Systems  Constitutional:  Negative for appetite change, chills and fever.  Respiratory:  Negative for cough and shortness of breath.   Cardiovascular:  Positive for chest pain. Negative for palpitations and leg swelling.  Gastrointestinal:  Negative for  abdominal pain, nausea and vomiting.  Musculoskeletal:  Negative for back pain.  Neurological:  Negative for dizziness, weakness and numbness.    Physical Exam Updated Vital Signs BP 100/60   Pulse (!) 111   Temp (!) 97.4 F (36.3 C) (Oral)   Resp 16   Ht 6' (1.829 m)   Wt 74.8 kg   SpO2 100%   BMI 22.38 kg/m  Physical Exam Vitals and nursing note reviewed.  Constitutional:      General: He is not in acute distress.    Appearance: He is well-developed. He is not ill-appearing or  toxic-appearing.  Cardiovascular:     Rate and Rhythm: Normal rate. Rhythm irregular.     Pulses: Normal pulses.  Pulmonary:     Effort: Pulmonary effort is normal.  Chest:     Chest wall: No tenderness.  Abdominal:     Palpations: Abdomen is soft.     Tenderness: There is no abdominal tenderness.  Musculoskeletal:     Right lower leg: No edema.     Left lower leg: No edema.  Skin:    General: Skin is warm.     Capillary Refill: Capillary refill takes less than 2 seconds.  Neurological:     General: No focal deficit present.     Mental Status: He is alert.     Sensory: No sensory deficit.     Motor: No weakness.     ED Results / Procedures / Treatments   Labs (all labs ordered are listed, but only abnormal results are displayed) Labs Reviewed  BASIC METABOLIC PANEL - Abnormal; Notable for the following components:      Result Value   Sodium 133 (*)    Chloride 93 (*)    BUN 51 (*)    Creatinine, Ser 6.61 (*)    GFR, Estimated 8 (*)    All other components within normal limits  CBC - Abnormal; Notable for the following components:   Hemoglobin 12.9 (*)    Platelets 134 (*)    All other components within normal limits  TROPONIN I (HIGH SENSITIVITY) - Abnormal; Notable for the following components:   Troponin I (High Sensitivity) 75 (*)    All other components within normal limits  TROPONIN I (HIGH SENSITIVITY)    EKG EKG Interpretation Date/Time:  Tuesday January 15 2023 11:44:57 EST Ventricular Rate:  109 PR Interval:    QRS Duration:  158 QT Interval:  412 QTC Calculation: 555 R Axis:   270  Text Interpretation: 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 EXAM: PORTABLE CHEST 1 VIEW COMPARISON:  Chest radiograph dated November 22, 2022. FINDINGS: Stable cardiomediastinal silhouette. Stable double lumen right IJ CVC catheter with the tip overlying the right atrium. Aortic atherosclerosis. Pulmonary vascular congestion. No focal consolidation, sizeable pleural effusion, or pneumothorax. No acute osseous abnormality. IMPRESSION: Cardiomegaly with pulmonary vascular congestion. Electronically Signed   By: Hart Robinsons M.D.   On: 01/15/2023 14:35    Procedures Procedures    Medications Ordered in ED Medications - No data to display  ED Course/ Medical Decision Making/ A&P  Medical Decision Making Patient with history of end-stage renal disease on dialysis.  Began having some chest discomfort about 45 minutes into his dialysis treatment today.  Described having pressure of his chest with some shortness of breath has history of atrial fibrillation on amiodarone and Eliquis, denies any missed doses.  There was concern for possible irregular rapid rhythm and patient was sent here for further evaluation.  Denies chest pain or shortness of breath here.  Symptoms lasted approximately 30 minutes then spontaneously resolved.  Had similar symptoms in October.  Patient noted to have transesophageal echo May of this year that showed EF of 50 to 55% ventricular systolic function was normal left atrial size was mild to moderately dilated  Amount and/or Complexity of Data Reviewed Labs: ordered.    Details: Labs interpreted by me, no evidence of leukocytosis, chemistries show BUN of 51 serum creatinine 6.61.  Patient was unable to complete his dialysis treatment today.  Initial troponin elevated at 75.  Delta troponin improved trending down at 67 Radiology: ordered.    Details: Chest x-ray cardiomegaly, pulmonary vascular congestion ECG/medicine tests: ordered.    Details: EKG read by Dr. Estell Harpin shows atrial flutter, ventricular rate 109  Repeat EKG shows atrial fibrillation, right bundle branch  block with rate of 84 Discussion of management or test interpretation with external provider(s): Mild tachycardia on arrival improved spontaneously.  Patient denies symptoms on my evaluation.  Had chest pain while on dialysis today, had a history of same.  Elevated troponins felt to be related to renal disease discussed findings with Dr. Estell Harpin, patient felt to be appropriate for discharge home will follow-up closely with cardiology and PCP           Final Clinical Impression(s) / ED Diagnoses Final diagnoses:  Nonspecific chest pain    Rx / DC Orders ED Discharge Orders     None         Pauline Aus, PA-C 01/15/23 2324    Bethann Berkshire, MD 01/16/23 1625

## 2023-01-17 DIAGNOSIS — N2581 Secondary hyperparathyroidism of renal origin: Secondary | ICD-10-CM | POA: Diagnosis not present

## 2023-01-17 DIAGNOSIS — Z992 Dependence on renal dialysis: Secondary | ICD-10-CM | POA: Diagnosis not present

## 2023-01-17 DIAGNOSIS — D631 Anemia in chronic kidney disease: Secondary | ICD-10-CM | POA: Diagnosis not present

## 2023-01-17 DIAGNOSIS — D509 Iron deficiency anemia, unspecified: Secondary | ICD-10-CM | POA: Diagnosis not present

## 2023-01-17 DIAGNOSIS — N186 End stage renal disease: Secondary | ICD-10-CM | POA: Diagnosis not present

## 2023-01-18 DIAGNOSIS — Z992 Dependence on renal dialysis: Secondary | ICD-10-CM | POA: Diagnosis not present

## 2023-01-18 DIAGNOSIS — N2581 Secondary hyperparathyroidism of renal origin: Secondary | ICD-10-CM | POA: Diagnosis not present

## 2023-01-18 DIAGNOSIS — D509 Iron deficiency anemia, unspecified: Secondary | ICD-10-CM | POA: Diagnosis not present

## 2023-01-18 DIAGNOSIS — N186 End stage renal disease: Secondary | ICD-10-CM | POA: Diagnosis not present

## 2023-01-18 DIAGNOSIS — D631 Anemia in chronic kidney disease: Secondary | ICD-10-CM | POA: Diagnosis not present

## 2023-01-21 DIAGNOSIS — N186 End stage renal disease: Secondary | ICD-10-CM | POA: Diagnosis not present

## 2023-01-21 DIAGNOSIS — Z992 Dependence on renal dialysis: Secondary | ICD-10-CM | POA: Diagnosis not present

## 2023-01-21 DIAGNOSIS — E8770 Fluid overload, unspecified: Secondary | ICD-10-CM | POA: Diagnosis not present

## 2023-01-21 DIAGNOSIS — N2581 Secondary hyperparathyroidism of renal origin: Secondary | ICD-10-CM | POA: Diagnosis not present

## 2023-01-23 DIAGNOSIS — N186 End stage renal disease: Secondary | ICD-10-CM | POA: Diagnosis not present

## 2023-01-23 DIAGNOSIS — Z992 Dependence on renal dialysis: Secondary | ICD-10-CM | POA: Diagnosis not present

## 2023-01-23 DIAGNOSIS — E8770 Fluid overload, unspecified: Secondary | ICD-10-CM | POA: Diagnosis not present

## 2023-01-23 DIAGNOSIS — N2581 Secondary hyperparathyroidism of renal origin: Secondary | ICD-10-CM | POA: Diagnosis not present

## 2023-01-25 ENCOUNTER — Encounter (HOSPITAL_COMMUNITY): Payer: Self-pay | Admitting: Emergency Medicine

## 2023-01-25 ENCOUNTER — Emergency Department (HOSPITAL_COMMUNITY): Payer: Medicare Other

## 2023-01-25 ENCOUNTER — Emergency Department (HOSPITAL_COMMUNITY)
Admission: EM | Admit: 2023-01-25 | Discharge: 2023-01-25 | Disposition: A | Discharge status: Home / Self Care | Payer: Medicare Other | Attending: Emergency Medicine | Admitting: Emergency Medicine

## 2023-01-25 ENCOUNTER — Other Ambulatory Visit: Payer: Self-pay

## 2023-01-25 DIAGNOSIS — R0689 Other abnormalities of breathing: Secondary | ICD-10-CM | POA: Insufficient documentation

## 2023-01-25 DIAGNOSIS — Z992 Dependence on renal dialysis: Secondary | ICD-10-CM | POA: Diagnosis not present

## 2023-01-25 DIAGNOSIS — I10 Essential (primary) hypertension: Secondary | ICD-10-CM | POA: Diagnosis not present

## 2023-01-25 DIAGNOSIS — N186 End stage renal disease: Secondary | ICD-10-CM | POA: Insufficient documentation

## 2023-01-25 DIAGNOSIS — I4891 Unspecified atrial fibrillation: Secondary | ICD-10-CM | POA: Diagnosis not present

## 2023-01-25 DIAGNOSIS — D696 Thrombocytopenia, unspecified: Secondary | ICD-10-CM | POA: Diagnosis not present

## 2023-01-25 DIAGNOSIS — N2581 Secondary hyperparathyroidism of renal origin: Secondary | ICD-10-CM | POA: Diagnosis not present

## 2023-01-25 DIAGNOSIS — I499 Cardiac arrhythmia, unspecified: Secondary | ICD-10-CM | POA: Diagnosis not present

## 2023-01-25 DIAGNOSIS — Z7901 Long term (current) use of anticoagulants: Secondary | ICD-10-CM | POA: Insufficient documentation

## 2023-01-25 DIAGNOSIS — D649 Anemia, unspecified: Secondary | ICD-10-CM | POA: Diagnosis not present

## 2023-01-25 DIAGNOSIS — D631 Anemia in chronic kidney disease: Secondary | ICD-10-CM | POA: Diagnosis not present

## 2023-01-25 DIAGNOSIS — I120 Hypertensive chronic kidney disease with stage 5 chronic kidney disease or end stage renal disease: Secondary | ICD-10-CM | POA: Diagnosis not present

## 2023-01-25 DIAGNOSIS — R000 Tachycardia, unspecified: Secondary | ICD-10-CM | POA: Diagnosis not present

## 2023-01-25 DIAGNOSIS — E8770 Fluid overload, unspecified: Secondary | ICD-10-CM | POA: Diagnosis not present

## 2023-01-25 DIAGNOSIS — Z79899 Other long term (current) drug therapy: Secondary | ICD-10-CM | POA: Diagnosis not present

## 2023-01-25 DIAGNOSIS — R079 Chest pain, unspecified: Secondary | ICD-10-CM | POA: Diagnosis not present

## 2023-01-25 DIAGNOSIS — R0989 Other specified symptoms and signs involving the circulatory and respiratory systems: Secondary | ICD-10-CM | POA: Diagnosis not present

## 2023-01-25 DIAGNOSIS — I480 Paroxysmal atrial fibrillation: Secondary | ICD-10-CM | POA: Diagnosis not present

## 2023-01-25 DIAGNOSIS — R531 Weakness: Secondary | ICD-10-CM | POA: Diagnosis not present

## 2023-01-25 LAB — TYPE AND SCREEN
ABO/RH(D): A POS
Antibody Screen: NEGATIVE

## 2023-01-25 LAB — CBC WITH DIFFERENTIAL/PLATELET
Abs Immature Granulocytes: 0.04 10*3/uL (ref 0.00–0.07)
Basophils Absolute: 0.1 10*3/uL (ref 0.0–0.1)
Basophils Relative: 1 %
Eosinophils Absolute: 0.3 10*3/uL (ref 0.0–0.5)
Eosinophils Relative: 4 %
HCT: 32.1 % — ABNORMAL LOW (ref 39.0–52.0)
Hemoglobin: 10.2 g/dL — ABNORMAL LOW (ref 13.0–17.0)
Immature Granulocytes: 1 %
Lymphocytes Relative: 26 %
Lymphs Abs: 1.9 10*3/uL (ref 0.7–4.0)
MCH: 30.2 pg (ref 26.0–34.0)
MCHC: 31.8 g/dL (ref 30.0–36.0)
MCV: 95 fL (ref 80.0–100.0)
Monocytes Absolute: 0.4 10*3/uL (ref 0.1–1.0)
Monocytes Relative: 6 %
Neutro Abs: 4.6 10*3/uL (ref 1.7–7.7)
Neutrophils Relative %: 62 %
Platelets: 169 10*3/uL (ref 150–400)
RBC: 3.38 MIL/uL — ABNORMAL LOW (ref 4.22–5.81)
RDW: 15.8 % — ABNORMAL HIGH (ref 11.5–15.5)
WBC: 7.2 10*3/uL (ref 4.0–10.5)
nRBC: 0 % (ref 0.0–0.2)

## 2023-01-25 LAB — I-STAT CHEM 8, ED
BUN: 57 mg/dL — ABNORMAL HIGH (ref 8–23)
Calcium, Ion: 1.1 mmol/L — ABNORMAL LOW (ref 1.15–1.40)
Chloride: 96 mmol/L — ABNORMAL LOW (ref 98–111)
Creatinine, Ser: 7.3 mg/dL — ABNORMAL HIGH (ref 0.61–1.24)
Glucose, Bld: 114 mg/dL — ABNORMAL HIGH (ref 70–99)
HCT: 31 % — ABNORMAL LOW (ref 39.0–52.0)
Hemoglobin: 10.5 g/dL — ABNORMAL LOW (ref 13.0–17.0)
Potassium: 3.9 mmol/L (ref 3.5–5.1)
Sodium: 136 mmol/L (ref 135–145)
TCO2: 25 mmol/L (ref 22–32)

## 2023-01-25 LAB — MAGNESIUM: Magnesium: 2.3 mg/dL (ref 1.7–2.4)

## 2023-01-25 LAB — COMPREHENSIVE METABOLIC PANEL
ALT: 8 U/L (ref 0–44)
AST: 14 U/L — ABNORMAL LOW (ref 15–41)
Albumin: 3.6 g/dL (ref 3.5–5.0)
Alkaline Phosphatase: 37 U/L — ABNORMAL LOW (ref 38–126)
Anion gap: 16 — ABNORMAL HIGH (ref 5–15)
BUN: 62 mg/dL — ABNORMAL HIGH (ref 8–23)
CO2: 25 mmol/L (ref 22–32)
Calcium: 9.3 mg/dL (ref 8.9–10.3)
Chloride: 96 mmol/L — ABNORMAL LOW (ref 98–111)
Creatinine, Ser: 6.61 mg/dL — ABNORMAL HIGH (ref 0.61–1.24)
GFR, Estimated: 8 mL/min — ABNORMAL LOW (ref >60)
Glucose, Bld: 119 mg/dL — ABNORMAL HIGH (ref 70–99)
Potassium: 3.9 mmol/L (ref 3.5–5.1)
Sodium: 137 mmol/L (ref 135–145)
Total Bilirubin: 0.2 mg/dL (ref <1.2)
Total Protein: 7.2 g/dL (ref 6.5–8.1)

## 2023-01-25 LAB — PROTIME-INR
INR: 1.2 (ref 0.8–1.2)
Prothrombin Time: 14.9 s (ref 11.4–15.2)

## 2023-01-25 LAB — TROPONIN I (HIGH SENSITIVITY)
Troponin I (High Sensitivity): 32 ng/L — ABNORMAL HIGH (ref <18)
Troponin I (High Sensitivity): 35 ng/L — ABNORMAL HIGH (ref <18)

## 2023-01-25 LAB — BRAIN NATRIURETIC PEPTIDE: B Natriuretic Peptide: 1421 pg/mL — ABNORMAL HIGH (ref 0.0–100.0)

## 2023-01-25 NOTE — ED Provider Notes (Signed)
[place] EMERGENCY DEPARTMENT AT [facility] Provider Note   CSN: [id number] Arrival date & time: 01/25/23  2006     History {Add pertinent medical, surgical, social history, OB history to HPI:1} Chief Complaint  Patient presents with   V- Tach    Glenn Olson is a 82 y.o. male.  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  He also has a history of atrial fibrillation on blood thinners, Eliquis and amiodarone.  It sounds like he was about an hour into his dialysis tonight when he went into A-fib with RVR.  It sounds like this is not usual.  Family called EMS and they also found him to possibly have gone into V. tach for 2 minutes.  He apparently then coughed and returned back to A-fib with RVR.  He currently denies any chest pain or shortness of breath.  He feels generally weak but not unusual for him.  The history is provided by the patient and the EMS personnel.       Home Medications Prior to Admission medications   Medication Sig Start Date End Date Taking? Authorizing Provider  amiodarone (PACERONE) 200 MG tablet Take 1 tablet (200 mg total) by mouth daily. 11/16/22   Fenton, Clint R, PA  apixaban (ELIQUIS) 2.5 MG TABS tablet Take 1 tablet (2.5 mg total) by mouth 2 (two) times daily. 01/12/23   Rhyne, Ames Coupe, PA-C  calcitRIOL (ROCALTROL) 0.5 MCG capsule Take 1 capsule (0.5 mcg total) by mouth daily. 06/28/22   Love, Evlyn Kanner, PA-C  calcium acetate (PHOSLO) 667 MG capsule Take 1 capsule (667 mg total) by mouth 3 (three) times daily with meals. 06/28/22   Love, Evlyn Kanner, PA-C  loratadine (CLARITIN) 10 MG tablet Take 1 tablet (10 mg total) by mouth daily. 06/29/22   Love, Evlyn Kanner, PA-C  melatonin 3 MG TABS tablet Take 1 tablet (3 mg total) by mouth at bedtime as needed. Patient taking differently: Take 3 mg by mouth at bedtime as needed (Sleep). 06/28/22   Love, Evlyn Kanner, PA-C  Methoxy PEG-Epoetin Beta (MIRCERA IJ) Mircera 11/03/22 11/02/23   [provider]  Nutritional Supplements (FEEDING SUPPLEMENT, NEPRO CARB STEADY,) LIQD Take 237 mLs by mouth 2 (two) times daily between meals. Patient not taking: Reported on 01/09/2023 06/20/22   Lanae Boast, MD  oxyCODONE-acetaminophen (PERCOCET) 5-325 MG tablet Take 1 tablet by mouth every 6 (six) hours as needed for severe pain (pain score 7-10). 01/11/23   Rhyne, Ames Coupe, PA-C  VELPHORO 500 MG chewable tablet Chew 500 mg by mouth 3 (three) times daily. 10/30/22   [provider]  vitamin D3 (CHOLECALCIFEROL) 25 MCG tablet Take 1 tablet (1,000 Units total) by mouth daily. 06/29/22   Jacquelynn Cree, PA-C      Allergies    Patient has no known allergies.    Review of Systems   Review of Systems  Constitutional:  Positive for fatigue. Negative for fever.  HENT:  Negative for sore throat.   Respiratory:  Negative for shortness of breath.   Cardiovascular:  Negative for chest pain.  Gastrointestinal:  Negative for abdominal pain.  Skin:  Negative for rash.  Neurological:  Negative for headaches.    Physical Exam Updated Vital Signs There were no vitals taken for this visit. Physical Exam Vitals and nursing note reviewed.  Constitutional:      General: He is not in acute distress.    Appearance: Normal appearance. He is  well-developed.  HENT:     Head: Normocephalic and atraumatic.  Eyes:     Conjunctiva/sclera: Conjunctivae normal.  Cardiovascular:     Rate and Rhythm: Tachycardia present. Rhythm irregular.     Heart sounds: No murmur heard.    Comments: Has a dialysis access catheter in his right upper chest. Pulmonary:     Effort: Pulmonary effort is normal. No respiratory distress.     Breath sounds: Normal breath sounds.  Abdominal:     Palpations: Abdomen is soft.     Tenderness: There is no abdominal tenderness. There is no guarding or rebound.  Musculoskeletal:        General: No deformity. Normal range of motion.     Cervical back: Neck supple.   Skin:    General: Skin is warm and dry.     Capillary Refill: Capillary refill takes less than 2 seconds.  Neurological:     General: No focal deficit present.     Mental Status: He is alert.     Sensory: No sensory deficit.     Motor: No weakness.     ED Results / Procedures / Treatments   Labs (all labs ordered are listed, but only abnormal results are displayed) Labs Reviewed - No data to display  EKG None  Radiology No results found.  Procedures Procedures  {Document cardiac monitor, telemetry assessment procedure when appropriate:1}  Medications Ordered in ED Medications - No data to display  ED Course/ Medical Decision Making/ A&P   {   Click here for ABCD2, HEART and other calculatorsREFRESH Note before signing :1}                              Medical Decision Making Amount and/or Complexity of Data Reviewed Labs: ordered. Radiology: ordered.   This patient complains of ***; this involves an extensive number of treatment Options and is a complaint that carries with it a high risk of complications and morbidity. The differential includes ***  I ordered, reviewed and interpreted labs, which included *** I ordered medication *** and reviewed PMP when indicated. I ordered imaging studies which included *** and I independently    visualized and interpreted imaging which showed *** Additional history obtained from *** Previous records obtained and reviewed *** I consulted *** and discussed lab and imaging findings and discussed disposition.  Cardiac monitoring reviewed, *** Social determinants considered, *** Critical Interventions: ***  After the interventions stated above, I reevaluated the patient and found *** Admission and further testing considered, ***   {Document critical care time when appropriate:1} {Document review of labs and clinical decision tools ie heart score, Chads2Vasc2 etc:1}  {Document your independent review of radiology images,  and any outside records:1} {Document your discussion with family members, caretakers, and with consultants:1} {Document social determinants of health affecting pt's care:1} {Document your decision making why or why not admission, treatments were needed:1} Final Clinical Impression(s) / ED Diagnoses Final diagnoses:  None    Rx / DC Orders ED Discharge Orders     None

## 2023-01-25 NOTE — ED Triage Notes (Signed)
Pt bib EMS via [facility] after they were called out to pt's home for A-Fib with RVR. EMS reports that pt states that he "normally goes into A-Fib at the end of his dialysis treatment". Pt was receiving dialysis at home and had not completed treatment when he went into A-fib with RVR (140's) per EMS. EMS states that upon their arrival, pt was in A-Fib with RVR and was c/o some slight chest pain and pain in his L arm and that pt had a brief (approximately 2 min) run of V tach but that pt "coughed" and returned to A-fib with RVR. Pt currently denies any CP. Dr Charm Barges at bedside.

## 2023-01-25 NOTE — Discharge Instructions (Signed)
You are seen in the emergency department for an episode of accelerated heart rate.  You were likely in atrial fibrillation.  Your heart rate is stabilized here and your blood work did not show any evidence of heart attack.  Please continue your regular medications.  I have put in a referral for you to follow-up with the cardiology team.  Return to the emergency department if any worsening or concerning symptoms

## 2023-01-26 DIAGNOSIS — Z992 Dependence on renal dialysis: Secondary | ICD-10-CM | POA: Diagnosis not present

## 2023-01-26 DIAGNOSIS — N186 End stage renal disease: Secondary | ICD-10-CM | POA: Diagnosis not present

## 2023-01-26 DIAGNOSIS — N2581 Secondary hyperparathyroidism of renal origin: Secondary | ICD-10-CM | POA: Diagnosis not present

## 2023-01-26 DIAGNOSIS — E8770 Fluid overload, unspecified: Secondary | ICD-10-CM | POA: Diagnosis not present

## 2023-01-28 ENCOUNTER — Other Ambulatory Visit (HOSPITAL_COMMUNITY): Payer: Self-pay | Admitting: Physician Assistant

## 2023-01-28 DIAGNOSIS — N186 End stage renal disease: Secondary | ICD-10-CM | POA: Diagnosis not present

## 2023-01-28 DIAGNOSIS — Z992 Dependence on renal dialysis: Secondary | ICD-10-CM | POA: Diagnosis not present

## 2023-01-28 DIAGNOSIS — E8770 Fluid overload, unspecified: Secondary | ICD-10-CM | POA: Diagnosis not present

## 2023-01-28 DIAGNOSIS — N2581 Secondary hyperparathyroidism of renal origin: Secondary | ICD-10-CM | POA: Diagnosis not present

## 2023-01-30 DIAGNOSIS — E8770 Fluid overload, unspecified: Secondary | ICD-10-CM | POA: Diagnosis not present

## 2023-01-30 DIAGNOSIS — N2581 Secondary hyperparathyroidism of renal origin: Secondary | ICD-10-CM | POA: Diagnosis not present

## 2023-01-30 DIAGNOSIS — D509 Iron deficiency anemia, unspecified: Secondary | ICD-10-CM | POA: Diagnosis not present

## 2023-01-30 DIAGNOSIS — I129 Hypertensive chronic kidney disease with stage 1 through stage 4 chronic kidney disease, or unspecified chronic kidney disease: Secondary | ICD-10-CM | POA: Diagnosis not present

## 2023-01-30 DIAGNOSIS — Z992 Dependence on renal dialysis: Secondary | ICD-10-CM | POA: Diagnosis not present

## 2023-01-30 DIAGNOSIS — N186 End stage renal disease: Secondary | ICD-10-CM | POA: Diagnosis not present

## 2023-01-30 DIAGNOSIS — D631 Anemia in chronic kidney disease: Secondary | ICD-10-CM | POA: Diagnosis not present

## 2023-01-31 DIAGNOSIS — N2581 Secondary hyperparathyroidism of renal origin: Secondary | ICD-10-CM | POA: Diagnosis not present

## 2023-01-31 DIAGNOSIS — Z992 Dependence on renal dialysis: Secondary | ICD-10-CM | POA: Diagnosis not present

## 2023-01-31 DIAGNOSIS — D631 Anemia in chronic kidney disease: Secondary | ICD-10-CM | POA: Diagnosis not present

## 2023-01-31 DIAGNOSIS — E8770 Fluid overload, unspecified: Secondary | ICD-10-CM | POA: Diagnosis not present

## 2023-01-31 DIAGNOSIS — D509 Iron deficiency anemia, unspecified: Secondary | ICD-10-CM | POA: Diagnosis not present

## 2023-01-31 DIAGNOSIS — N186 End stage renal disease: Secondary | ICD-10-CM | POA: Diagnosis not present

## 2023-02-01 DIAGNOSIS — R7301 Impaired fasting glucose: Secondary | ICD-10-CM | POA: Diagnosis not present

## 2023-02-01 DIAGNOSIS — E559 Vitamin D deficiency, unspecified: Secondary | ICD-10-CM | POA: Diagnosis not present

## 2023-02-01 DIAGNOSIS — I10 Essential (primary) hypertension: Secondary | ICD-10-CM | POA: Diagnosis not present

## 2023-02-02 DIAGNOSIS — N186 End stage renal disease: Secondary | ICD-10-CM | POA: Diagnosis not present

## 2023-02-02 DIAGNOSIS — D509 Iron deficiency anemia, unspecified: Secondary | ICD-10-CM | POA: Diagnosis not present

## 2023-02-02 DIAGNOSIS — Z992 Dependence on renal dialysis: Secondary | ICD-10-CM | POA: Diagnosis not present

## 2023-02-02 DIAGNOSIS — D631 Anemia in chronic kidney disease: Secondary | ICD-10-CM | POA: Diagnosis not present

## 2023-02-02 DIAGNOSIS — N2581 Secondary hyperparathyroidism of renal origin: Secondary | ICD-10-CM | POA: Diagnosis not present

## 2023-02-02 DIAGNOSIS — E8770 Fluid overload, unspecified: Secondary | ICD-10-CM | POA: Diagnosis not present

## 2023-02-02 LAB — COMPREHENSIVE METABOLIC PANEL: EGFR: 10

## 2023-02-02 LAB — HEMOGLOBIN A1C: A1c: 5.2

## 2023-02-05 DIAGNOSIS — N186 End stage renal disease: Secondary | ICD-10-CM | POA: Diagnosis not present

## 2023-02-05 DIAGNOSIS — E8770 Fluid overload, unspecified: Secondary | ICD-10-CM | POA: Diagnosis not present

## 2023-02-05 DIAGNOSIS — Z992 Dependence on renal dialysis: Secondary | ICD-10-CM | POA: Diagnosis not present

## 2023-02-05 DIAGNOSIS — D509 Iron deficiency anemia, unspecified: Secondary | ICD-10-CM | POA: Diagnosis not present

## 2023-02-05 DIAGNOSIS — D631 Anemia in chronic kidney disease: Secondary | ICD-10-CM | POA: Diagnosis not present

## 2023-02-05 DIAGNOSIS — N2581 Secondary hyperparathyroidism of renal origin: Secondary | ICD-10-CM | POA: Diagnosis not present

## 2023-02-06 DIAGNOSIS — R079 Chest pain, unspecified: Secondary | ICD-10-CM | POA: Diagnosis not present

## 2023-02-06 DIAGNOSIS — Z992 Dependence on renal dialysis: Secondary | ICD-10-CM | POA: Diagnosis not present

## 2023-02-06 DIAGNOSIS — N186 End stage renal disease: Secondary | ICD-10-CM | POA: Diagnosis not present

## 2023-02-06 DIAGNOSIS — D631 Anemia in chronic kidney disease: Secondary | ICD-10-CM | POA: Diagnosis not present

## 2023-02-06 DIAGNOSIS — I480 Paroxysmal atrial fibrillation: Secondary | ICD-10-CM | POA: Diagnosis not present

## 2023-02-06 DIAGNOSIS — E8770 Fluid overload, unspecified: Secondary | ICD-10-CM | POA: Diagnosis not present

## 2023-02-06 DIAGNOSIS — D696 Thrombocytopenia, unspecified: Secondary | ICD-10-CM | POA: Diagnosis not present

## 2023-02-06 DIAGNOSIS — R7301 Impaired fasting glucose: Secondary | ICD-10-CM | POA: Diagnosis not present

## 2023-02-06 DIAGNOSIS — I10 Essential (primary) hypertension: Secondary | ICD-10-CM | POA: Diagnosis not present

## 2023-02-06 DIAGNOSIS — E782 Mixed hyperlipidemia: Secondary | ICD-10-CM | POA: Diagnosis not present

## 2023-02-06 DIAGNOSIS — E039 Hypothyroidism, unspecified: Secondary | ICD-10-CM | POA: Diagnosis not present

## 2023-02-06 DIAGNOSIS — N2581 Secondary hyperparathyroidism of renal origin: Secondary | ICD-10-CM | POA: Diagnosis not present

## 2023-02-06 DIAGNOSIS — D638 Anemia in other chronic diseases classified elsewhere: Secondary | ICD-10-CM | POA: Diagnosis not present

## 2023-02-06 DIAGNOSIS — G4700 Insomnia, unspecified: Secondary | ICD-10-CM | POA: Diagnosis not present

## 2023-02-06 DIAGNOSIS — I120 Hypertensive chronic kidney disease with stage 5 chronic kidney disease or end stage renal disease: Secondary | ICD-10-CM | POA: Diagnosis not present

## 2023-02-06 DIAGNOSIS — J309 Allergic rhinitis, unspecified: Secondary | ICD-10-CM | POA: Diagnosis not present

## 2023-02-06 DIAGNOSIS — D509 Iron deficiency anemia, unspecified: Secondary | ICD-10-CM | POA: Diagnosis not present

## 2023-02-06 NOTE — Progress Notes (Signed)
 CARDIOLOGY CONSULT NOTE       Patient ID: Glenn Olson MRN: [id number] DOB/AGE: 1941-01-18 83 y.o.  Admit date: (Not on file) Referring Physician: Shona Primary Physician: [name], MD Primary Cardiologist: New to me Prior Varanasi Reason for Consultation: AFib  Active Problems:   * No active hospital problems. *   HPI:  83 y.o. new to me Previously seen in consult by [name] 05/30/22 for afib.  History of CRF. Had stopped dialysis for over a year when admitted in May with uremia and dialysis resumed. Noted to be in flutter/fib and converted spontaneously on 06/03/22 and then paroxysmal. Multiple electrolyte/lab abnormalities including K 2.4 , Mg 1.6 and Hb 6.7  CHADVASC 2. Eventually had TEE/DCC with [name] 06/19/22 RV/LV normal no significant valve dx. NO LAA thrombus  Back in ER 07/11/22 with PAF in setting of suprpubic catheter being embedded in bladder wall  Has had issues with med compliance running out of cardizem .  LA moderately dilated on TTE 06/05/22 Maintained on amiodarone  200 mg daily and low dose eliquis  2.5 mg bid ( age >28 and ESRD ) He gets dialysis Tu/Th/Sat  Sees Dr [name] from [facility] and had left brachial to cephalic vein fistula created 87/86/75 Not functional and still has right subclavian catheter He still makes a bit of urine and has a suprapubic catheter with bag at night   Most recent Hct 32.1 PLT 169 BUN 62 CR 6.6 01/25/23   Gets home dialysis and had ER visit 01/25/23 for ? VT but in ER ECG showed flutter rate 99 with chronic RBBB.   He is in NSR today Discussed need to r/o CAD and monitor Also discussed need for AV nodal drug.   He use to ride an old Harley Knucklehead and likes [place].   ROS All other systems reviewed and negative except as noted above  Past Medical History:  Diagnosis Date   Anemia    Cancer (HCC)    Chronic kidney disease    Dysrhythmia    PAF 05/30/22   GERD (gastroesophageal reflux disease)    Neurogenic bladder     History  reviewed. No pertinent family history.  Social History   Socioeconomic History   Marital status: Divorced    Spouse name: Not on file   Number of children: Not on file   Years of education: Not on file   Highest education level: Not on file  Occupational History   Not on file  Tobacco Use   Smoking status: Never   Smokeless tobacco: Never   Tobacco comments:    Never smoked 09/26/22  Vaping Use   Vaping status: Never Used  Substance and Sexual Activity   Alcohol use: Yes    Comment: patient states he is a social drinker   Drug use: No   Sexual activity: Not on file  Other Topics Concern   Not on file  Social History Narrative   Dialysis on T-Th-Sat   Social Drivers of Health   Financial Resource Strain: Not on file  Food Insecurity: No Food Insecurity (06/18/2022)   Hunger Vital Sign    Worried About Running Out of Food in the Last Year: Never true    Ran Out of Food in the Last Year: Never true  Transportation Needs: No Transportation Needs (06/18/2022)   PRAPARE - Administrator, Civil Service (Medical): No    Lack of Transportation (Non-Medical): No  Physical Activity: Not on file  Stress: Not on  file  Social Connections: Not on file  Intimate Partner Violence: Not At Risk (06/18/2022)   Humiliation, Afraid, Rape, and Kick questionnaire    Fear of Current or Ex-Partner: No    Emotionally Abused: No    Physically Abused: No    Sexually Abused: No    Past Surgical History:  Procedure Laterality Date   AV FISTULA PLACEMENT Left 09/27/2020   Procedure: LEFT ARM ARTERIOVENOUS GRAFT PLACEMENT USING GORE-TEX 4-7MM X 45CM  VASCULAR GRAFT;  Surgeon: [name], MD;  Location: [facility];  Service: Vascular;  Laterality: Left;   AV FISTULA PLACEMENT Left 01/11/2023   Procedure: LEFT ARM ARTERIOVENOUS (AV) FISTULA CREATION;  Surgeon: [name] Penne Bruckner, MD;  Location: [facility] OR;  Service: Vascular;  Laterality: Left;   CAPD INSERTION N/A 03/17/2021   Procedure:  ATTEMPTED INSERTION OF LAPAROSCOPIC CONTINUOUS AMBULATORY PERITONEAL DIALYSIS (CAPD) CATHETER;  Surgeon: [name] Penne Bruckner, MD;  Location: [facility] OR;  Service: Vascular;  Laterality: N/A;   CARDIOVERSION N/A 06/19/2022   Procedure: CARDIOVERSION;  Surgeon: [name], MD;  Location: [facility] INVASIVE CV LAB;  Service: Cardiovascular;  Laterality: N/A;   CYSTOURETHROSCOPY     INSERTION OF DIALYSIS CATHETER Right 09/27/2020   Procedure: INSERTION OF PALINDROME TUNNELED DIALYSIS CATHETER 14.65f X 23CM;  Surgeon: [name], MD;  Location: [facility];  Service: Vascular;  Laterality: Right;   INSERTION OF DIALYSIS CATHETER Right 05/31/2022   Procedure: INSERTION OF DIALYSIS CATHETER;  Surgeon: [name], MD;  Location[facility] OR;  Service: Vascular;  Laterality: Right;   IR REMOVAL TUN CV CATH W/O FL  06/20/2022   REMOVAL OF A DIALYSIS CATHETER N/A 12/06/2020   Procedure: MINOR REMOVAL OF A DIALYSIS CATHETER;  Surgeon: [name], MD;  Location: [facility];  Service: Vascular;  Laterality: N/A;   SUPRAPUBIC CATHETER PLACEMENT     TEE WITHOUT CARDIOVERSION N/A 06/19/2022   Procedure: TRANSESOPHAGEAL ECHOCARDIOGRAM;  Surgeon: [name], MD;  Location: [facility] INVASIVE CV LAB;  Service: Cardiovascular;  Laterality: N/A;      Current Outpatient Medications:    amiodarone  (PACERONE ) 200 MG tablet, Take 1 tablet (200 mg total) by mouth daily., Disp: 90 tablet, Rfl: 2   apixaban  (ELIQUIS ) 2.5 MG TABS tablet, TAKE 1 TABLET TWICE A DAY, Disp: 180 tablet, Rfl: 1   calcitRIOL  (ROCALTROL ) 0.5 MCG capsule, Take 1 capsule (0.5 mcg total) by mouth daily., Disp: 30 capsule, Rfl: 0   loratadine  (CLARITIN ) 10 MG tablet, Take 1 tablet (10 mg total) by mouth daily., Disp: 30 tablet, Rfl: 0   melatonin 3 MG TABS tablet, Take 1 tablet (3 mg total) by mouth at bedtime as needed. (Patient taking differently: Take 3 mg by mouth at bedtime as needed (Sleep).), Disp: 30 tablet, Rfl: 0   Methoxy PEG-Epoetin  Beta  (MIRCERA IJ), Mircera, Disp: , Rfl:    VELPHORO 500 MG chewable tablet, Chew 500 mg by mouth 3 (three) times daily., Disp: , Rfl:    vitamin D3 (CHOLECALCIFEROL ) 25 MCG tablet, Take 1 tablet (1,000 Units total) by mouth daily., Disp: 30 tablet, Rfl: 0   oxyCODONE -acetaminophen  (PERCOCET) 5-325 MG tablet, Take 1 tablet by mouth every 6 (six) hours as needed for severe pain (pain score 7-10). (Patient not taking: Reported on 02/12/2023), Disp: 8 tablet, Rfl: 0    Physical Exam: Blood pressure 132/60, pulse 81, height 6' (1.829 m), weight 175 lb (79.4 kg), SpO2 95%.    Chronically ill male Lungs clear SEM murmur radiates to carotids  Right subclavian dialysis catheter  Left UE fistula Abdomen benign suprapubic catheter  Trace edema Palpable pedal pulses   Labs:   Lab Results  Component Value Date   WBC 7.2 01/25/2023   HGB 10.5 (L) 01/25/2023   HCT 31.0 (L) 01/25/2023   MCV 95.0 01/25/2023   PLT 169 01/25/2023   No results for input(s): NA, K, CL, CO2, BUN, CREATININE, CALCIUM , PROT, BILITOT, ALKPHOS, ALT, AST, GLUCOSE in the last 168 hours.  Invalid input(s): LABALBU No results found for: CKTOTAL, CKMB, CKMBINDEX, TROPONINI No results found for: CHOL No results found for: HDL No results found for: LDLCALC No results found for: TRIG No results found for: CHOLHDL No results found for: LDLDIRECT    Radiology: DG Chest Port 1 View Result Date: 01/25/2023 CLINICAL DATA:  Weakness, atrial fibrillation EXAM: PORTABLE CHEST 1 VIEW COMPARISON:  01/15/2023 FINDINGS: Single frontal view of the chest demonstrates right internal jugular dialysis catheter tip overlying atriocaval junction. External defibrillator pads overlie the chest. The cardiac silhouette is stable. There is stable pulmonary vascular congestion, without airspace disease, effusion, or pneumothorax. No acute bony abnormalities. IMPRESSION: 1. Chronic vascular congestion.  No  acute airspace disease. Electronically Signed   By: Ozell Daring M.D.   On: 01/25/2023 20:57   DG Chest Port 1 View Result Date: 01/15/2023 CLINICAL DATA:  Chest pain. EXAM: PORTABLE CHEST 1 VIEW COMPARISON:  Chest radiograph dated November 22, 2022. FINDINGS: Stable cardiomediastinal silhouette. Stable double lumen right IJ CVC catheter with the tip overlying the right atrium. Aortic atherosclerosis. Pulmonary vascular congestion. No focal consolidation, sizeable pleural effusion, or pneumothorax. No acute osseous abnormality. IMPRESSION: Cardiomegaly with pulmonary vascular congestion. Electronically Signed   By: Harrietta Sherry M.D.   On: 01/15/2023 14:35    EKG: Flutter RBBB rate 99 01/28/23   ASSESSMENT AND PLAN:   PaF/Flutter:  on low dose eliquis  appropriate for age/CRF. Also on amiodarone  200 mg daily . TSH 4.7 09/26/22 with normal LFTls Check PFT;s DLCO CXR 12/27 no acute airspace dx. Not on any AV nodal drugs start cardizem  120 mg daily .  Will need 30 day monitor to see burden of PAF and see if alternative AAT needed.  CRF:  left UE fistula f/u Dr [name]. Home dialysis 4 days/week  Labs with nephrology Saw [place] in hospital Anemia: improved any need for erythropoietin  for nephrology  CAD:  risk given CRF/Age and need for AAT for PAF will order lexiscan  myouve. EF normal on TTE/TEE May 2024 No chest pain  Lexiscan  myovue 30 day monitor  Cardizem  120 mg daily   F/U me/PA or afib clinic 8 weeks  Signed: [name] 02/12/2023, 1:31 PM

## 2023-02-07 DIAGNOSIS — N186 End stage renal disease: Secondary | ICD-10-CM | POA: Diagnosis not present

## 2023-02-07 DIAGNOSIS — D631 Anemia in chronic kidney disease: Secondary | ICD-10-CM | POA: Diagnosis not present

## 2023-02-07 DIAGNOSIS — Z992 Dependence on renal dialysis: Secondary | ICD-10-CM | POA: Diagnosis not present

## 2023-02-07 DIAGNOSIS — E8770 Fluid overload, unspecified: Secondary | ICD-10-CM | POA: Diagnosis not present

## 2023-02-07 DIAGNOSIS — D509 Iron deficiency anemia, unspecified: Secondary | ICD-10-CM | POA: Diagnosis not present

## 2023-02-07 DIAGNOSIS — N2581 Secondary hyperparathyroidism of renal origin: Secondary | ICD-10-CM | POA: Diagnosis not present

## 2023-02-08 DIAGNOSIS — N186 End stage renal disease: Secondary | ICD-10-CM | POA: Diagnosis not present

## 2023-02-08 DIAGNOSIS — N2581 Secondary hyperparathyroidism of renal origin: Secondary | ICD-10-CM | POA: Diagnosis not present

## 2023-02-08 DIAGNOSIS — D509 Iron deficiency anemia, unspecified: Secondary | ICD-10-CM | POA: Diagnosis not present

## 2023-02-08 DIAGNOSIS — E8770 Fluid overload, unspecified: Secondary | ICD-10-CM | POA: Diagnosis not present

## 2023-02-08 DIAGNOSIS — D631 Anemia in chronic kidney disease: Secondary | ICD-10-CM | POA: Diagnosis not present

## 2023-02-08 DIAGNOSIS — Z992 Dependence on renal dialysis: Secondary | ICD-10-CM | POA: Diagnosis not present

## 2023-02-11 DIAGNOSIS — D509 Iron deficiency anemia, unspecified: Secondary | ICD-10-CM | POA: Diagnosis not present

## 2023-02-11 DIAGNOSIS — N186 End stage renal disease: Secondary | ICD-10-CM | POA: Diagnosis not present

## 2023-02-11 DIAGNOSIS — Z992 Dependence on renal dialysis: Secondary | ICD-10-CM | POA: Diagnosis not present

## 2023-02-11 DIAGNOSIS — E8770 Fluid overload, unspecified: Secondary | ICD-10-CM | POA: Diagnosis not present

## 2023-02-11 DIAGNOSIS — N2581 Secondary hyperparathyroidism of renal origin: Secondary | ICD-10-CM | POA: Diagnosis not present

## 2023-02-11 DIAGNOSIS — D631 Anemia in chronic kidney disease: Secondary | ICD-10-CM | POA: Diagnosis not present

## 2023-02-12 ENCOUNTER — Encounter: Payer: Self-pay | Admitting: *Deleted

## 2023-02-12 ENCOUNTER — Encounter: Payer: Self-pay | Admitting: Cardiovascular Disease

## 2023-02-12 ENCOUNTER — Ambulatory Visit: Payer: Medicare Other | Attending: Cardiovascular Disease | Admitting: Cardiovascular Disease

## 2023-02-12 VITALS — BP 132/60 | HR 81 | Ht 72.0 in | Wt 175.0 lb

## 2023-02-12 DIAGNOSIS — N2581 Secondary hyperparathyroidism of renal origin: Secondary | ICD-10-CM | POA: Diagnosis not present

## 2023-02-12 DIAGNOSIS — I483 Typical atrial flutter: Secondary | ICD-10-CM | POA: Diagnosis not present

## 2023-02-12 DIAGNOSIS — E8770 Fluid overload, unspecified: Secondary | ICD-10-CM | POA: Diagnosis not present

## 2023-02-12 DIAGNOSIS — Z992 Dependence on renal dialysis: Secondary | ICD-10-CM | POA: Diagnosis not present

## 2023-02-12 DIAGNOSIS — I4819 Other persistent atrial fibrillation: Secondary | ICD-10-CM | POA: Diagnosis not present

## 2023-02-12 DIAGNOSIS — R079 Chest pain, unspecified: Secondary | ICD-10-CM

## 2023-02-12 DIAGNOSIS — D509 Iron deficiency anemia, unspecified: Secondary | ICD-10-CM | POA: Diagnosis not present

## 2023-02-12 DIAGNOSIS — N186 End stage renal disease: Secondary | ICD-10-CM | POA: Diagnosis not present

## 2023-02-12 DIAGNOSIS — D6869 Other thrombophilia: Secondary | ICD-10-CM

## 2023-02-12 DIAGNOSIS — D631 Anemia in chronic kidney disease: Secondary | ICD-10-CM | POA: Diagnosis not present

## 2023-02-12 MED ORDER — DILTIAZEM HCL ER COATED BEADS 120 MG PO CP24: 120.0000 mg | ORAL_CAPSULE | Freq: Every day | ORAL | 3 refills | Status: DC

## 2023-02-12 NOTE — Patient Instructions (Signed)
 Medication Instructions:  Your physician recommends that you continue on your current medications as directed. Please refer to the Current Medication list given to you today.  *If you need a refill on your cardiac medications before your next appointment, please call your pharmacy*   Lab Work: NONE   If you have labs (blood work) drawn today and your tests are completely normal, you will receive your results only by: MyChart Message (if you have MyChart) OR A paper copy in the mail If you have any lab test that is abnormal or we need to change your treatment, we will call you to review the results.   Testing/Procedures: Your physician has recommended that you wear an event monitor. Event monitors are medical devices that record the heart's electrical activity. Doctors most often us  these monitors to diagnose arrhythmias. Arrhythmias are problems with the speed or rhythm of the heartbeat. The monitor is a small, portable device. You can wear one while you do your normal daily activities. This is usually used to diagnose what is causing palpitations/syncope (passing out).  Your physician has requested that you have a lexiscan  myoview . For further information please visit [email or web address] Please follow instruction sheet, as given.    Follow-Up: At [facility], you and your health needs are our priority.  As part of our continuing mission to provide you with exceptional heart care, we have created designated Provider Care Teams.  These Care Teams include your primary Cardiologist (physician) and Advanced Practice Providers (APPs -  Physician Assistants and Nurse Practitioners) who all work together to provide you with the care you need, when you need it.  We recommend signing up for the patient portal called MyChart.  Sign up information is provided on this After Visit Summary.  MyChart is used to connect with patients for Virtual Visits (Telemedicine).  Patients are able to view  lab/test results, encounter notes, upcoming appointments, etc.  Non-urgent messages can be sent to your provider as well.   To learn more about what you can do with MyChart, go to [email or web address].    Your next appointment:   8 week(s)  Provider:   [name], MD    Other Instructions Thank you for choosing [facility]!

## 2023-02-14 DIAGNOSIS — N186 End stage renal disease: Secondary | ICD-10-CM | POA: Diagnosis not present

## 2023-02-14 DIAGNOSIS — N2581 Secondary hyperparathyroidism of renal origin: Secondary | ICD-10-CM | POA: Diagnosis not present

## 2023-02-14 DIAGNOSIS — E8770 Fluid overload, unspecified: Secondary | ICD-10-CM | POA: Diagnosis not present

## 2023-02-14 DIAGNOSIS — Z992 Dependence on renal dialysis: Secondary | ICD-10-CM | POA: Diagnosis not present

## 2023-02-14 DIAGNOSIS — D631 Anemia in chronic kidney disease: Secondary | ICD-10-CM | POA: Diagnosis not present

## 2023-02-14 DIAGNOSIS — D509 Iron deficiency anemia, unspecified: Secondary | ICD-10-CM | POA: Diagnosis not present

## 2023-02-16 DIAGNOSIS — Z992 Dependence on renal dialysis: Secondary | ICD-10-CM | POA: Diagnosis not present

## 2023-02-16 DIAGNOSIS — D631 Anemia in chronic kidney disease: Secondary | ICD-10-CM | POA: Diagnosis not present

## 2023-02-16 DIAGNOSIS — N186 End stage renal disease: Secondary | ICD-10-CM | POA: Diagnosis not present

## 2023-02-16 DIAGNOSIS — E8770 Fluid overload, unspecified: Secondary | ICD-10-CM | POA: Diagnosis not present

## 2023-02-16 DIAGNOSIS — N2581 Secondary hyperparathyroidism of renal origin: Secondary | ICD-10-CM | POA: Diagnosis not present

## 2023-02-16 DIAGNOSIS — D509 Iron deficiency anemia, unspecified: Secondary | ICD-10-CM | POA: Diagnosis not present

## 2023-02-18 ENCOUNTER — Ambulatory Visit: Payer: Medicare Other | Admitting: Urology

## 2023-02-18 ENCOUNTER — Other Ambulatory Visit: Payer: Self-pay | Admitting: *Deleted

## 2023-02-18 DIAGNOSIS — N186 End stage renal disease: Secondary | ICD-10-CM

## 2023-02-18 DIAGNOSIS — D509 Iron deficiency anemia, unspecified: Secondary | ICD-10-CM | POA: Diagnosis not present

## 2023-02-18 DIAGNOSIS — D631 Anemia in chronic kidney disease: Secondary | ICD-10-CM | POA: Diagnosis not present

## 2023-02-18 DIAGNOSIS — E8770 Fluid overload, unspecified: Secondary | ICD-10-CM | POA: Diagnosis not present

## 2023-02-18 DIAGNOSIS — N2581 Secondary hyperparathyroidism of renal origin: Secondary | ICD-10-CM | POA: Diagnosis not present

## 2023-02-18 DIAGNOSIS — Z992 Dependence on renal dialysis: Secondary | ICD-10-CM | POA: Diagnosis not present

## 2023-02-20 DIAGNOSIS — E8770 Fluid overload, unspecified: Secondary | ICD-10-CM | POA: Diagnosis not present

## 2023-02-20 DIAGNOSIS — D509 Iron deficiency anemia, unspecified: Secondary | ICD-10-CM | POA: Diagnosis not present

## 2023-02-20 DIAGNOSIS — N186 End stage renal disease: Secondary | ICD-10-CM | POA: Diagnosis not present

## 2023-02-20 DIAGNOSIS — N2581 Secondary hyperparathyroidism of renal origin: Secondary | ICD-10-CM | POA: Diagnosis not present

## 2023-02-20 DIAGNOSIS — D631 Anemia in chronic kidney disease: Secondary | ICD-10-CM | POA: Diagnosis not present

## 2023-02-20 DIAGNOSIS — Z992 Dependence on renal dialysis: Secondary | ICD-10-CM | POA: Diagnosis not present

## 2023-02-21 DIAGNOSIS — Z992 Dependence on renal dialysis: Secondary | ICD-10-CM | POA: Diagnosis not present

## 2023-02-21 DIAGNOSIS — D509 Iron deficiency anemia, unspecified: Secondary | ICD-10-CM | POA: Diagnosis not present

## 2023-02-21 DIAGNOSIS — D631 Anemia in chronic kidney disease: Secondary | ICD-10-CM | POA: Diagnosis not present

## 2023-02-21 DIAGNOSIS — N2581 Secondary hyperparathyroidism of renal origin: Secondary | ICD-10-CM | POA: Diagnosis not present

## 2023-02-21 DIAGNOSIS — E8770 Fluid overload, unspecified: Secondary | ICD-10-CM | POA: Diagnosis not present

## 2023-02-21 DIAGNOSIS — N186 End stage renal disease: Secondary | ICD-10-CM | POA: Diagnosis not present

## 2023-02-22 ENCOUNTER — Encounter (HOSPITAL_COMMUNITY): Payer: Self-pay

## 2023-02-22 ENCOUNTER — Ambulatory Visit (HOSPITAL_COMMUNITY)
Admission: RE | Admit: 2023-02-22 | Discharge: 2023-02-22 | Disposition: A | Discharge status: Home / Self Care | Payer: Medicare Other | Source: Ambulatory Visit | Attending: Internal Medicine | Admitting: Internal Medicine

## 2023-02-22 ENCOUNTER — Encounter (HOSPITAL_BASED_OUTPATIENT_CLINIC_OR_DEPARTMENT_OTHER)
Admission: RE | Admit: 2023-02-22 | Discharge: 2023-02-22 | Disposition: A | Discharge status: Home / Self Care | Payer: Medicare Other | Source: Ambulatory Visit | Attending: Cardiovascular Disease | Admitting: Cardiovascular Disease

## 2023-02-22 DIAGNOSIS — I4892 Unspecified atrial flutter: Secondary | ICD-10-CM | POA: Diagnosis not present

## 2023-02-22 DIAGNOSIS — E8770 Fluid overload, unspecified: Secondary | ICD-10-CM | POA: Diagnosis not present

## 2023-02-22 DIAGNOSIS — R079 Chest pain, unspecified: Secondary | ICD-10-CM | POA: Insufficient documentation

## 2023-02-22 DIAGNOSIS — D509 Iron deficiency anemia, unspecified: Secondary | ICD-10-CM | POA: Diagnosis not present

## 2023-02-22 DIAGNOSIS — I480 Paroxysmal atrial fibrillation: Secondary | ICD-10-CM | POA: Insufficient documentation

## 2023-02-22 DIAGNOSIS — N186 End stage renal disease: Secondary | ICD-10-CM | POA: Diagnosis not present

## 2023-02-22 DIAGNOSIS — N2581 Secondary hyperparathyroidism of renal origin: Secondary | ICD-10-CM | POA: Diagnosis not present

## 2023-02-22 DIAGNOSIS — D631 Anemia in chronic kidney disease: Secondary | ICD-10-CM | POA: Diagnosis not present

## 2023-02-22 DIAGNOSIS — Z992 Dependence on renal dialysis: Secondary | ICD-10-CM | POA: Diagnosis not present

## 2023-02-22 HISTORY — DX: Disorder of kidney and ureter, unspecified: N28.9

## 2023-02-22 LAB — NM MYOCAR MULTI W/SPECT W/WALL MOTION / EF
Base ST Depression (mm): 0 mm
LV dias vol: 73 mL (ref 62–150)
LV sys vol: 28 mL
Nuc Stress EF: 62 %
Peak HR: 78 {beats}/min
RATE: 0.3
Rest HR: 68 {beats}/min
Rest Nuclear Isotope Dose: 10.8 mCi
SDS: 4
SRS: 0
SSS: 4
ST Depression (mm): 0 mm
Stress Nuclear Isotope Dose: 31 mCi
TID: 1.02

## 2023-02-22 MED ORDER — TECHNETIUM TC 99M TETROFOSMIN IV KIT
30.0000 | PACK | Freq: Once | INTRAVENOUS | Status: AC | PRN
  Administered 2023-02-22: 31 via INTRAVENOUS

## 2023-02-22 MED ORDER — SODIUM CHLORIDE FLUSH 0.9 % IV SOLN
INTRAVENOUS | Status: AC
  Administered 2023-02-22: 10 mL via INTRAVENOUS
  Filled 2023-02-22: qty 10

## 2023-02-22 MED ORDER — TECHNETIUM TC 99M TETROFOSMIN IV KIT
10.0000 | PACK | Freq: Once | INTRAVENOUS | Status: AC | PRN
  Administered 2023-02-22: 10.8 via INTRAVENOUS

## 2023-02-22 MED ORDER — REGADENOSON 0.4 MG/5ML IV SOLN
INTRAVENOUS | Status: AC
  Administered 2023-02-22: 0.4 mg via INTRAVENOUS
  Filled 2023-02-22: qty 5

## 2023-02-25 DIAGNOSIS — Z992 Dependence on renal dialysis: Secondary | ICD-10-CM | POA: Diagnosis not present

## 2023-02-25 DIAGNOSIS — D631 Anemia in chronic kidney disease: Secondary | ICD-10-CM | POA: Diagnosis not present

## 2023-02-25 DIAGNOSIS — D509 Iron deficiency anemia, unspecified: Secondary | ICD-10-CM | POA: Diagnosis not present

## 2023-02-25 DIAGNOSIS — I480 Paroxysmal atrial fibrillation: Secondary | ICD-10-CM | POA: Diagnosis not present

## 2023-02-25 DIAGNOSIS — N186 End stage renal disease: Secondary | ICD-10-CM | POA: Diagnosis not present

## 2023-02-25 DIAGNOSIS — E8770 Fluid overload, unspecified: Secondary | ICD-10-CM | POA: Diagnosis not present

## 2023-02-25 DIAGNOSIS — N2581 Secondary hyperparathyroidism of renal origin: Secondary | ICD-10-CM | POA: Diagnosis not present

## 2023-02-26 DIAGNOSIS — E8770 Fluid overload, unspecified: Secondary | ICD-10-CM | POA: Diagnosis not present

## 2023-02-26 DIAGNOSIS — D631 Anemia in chronic kidney disease: Secondary | ICD-10-CM | POA: Diagnosis not present

## 2023-02-26 DIAGNOSIS — Z992 Dependence on renal dialysis: Secondary | ICD-10-CM | POA: Diagnosis not present

## 2023-02-26 DIAGNOSIS — N2581 Secondary hyperparathyroidism of renal origin: Secondary | ICD-10-CM | POA: Diagnosis not present

## 2023-02-26 DIAGNOSIS — N186 End stage renal disease: Secondary | ICD-10-CM | POA: Diagnosis not present

## 2023-02-26 DIAGNOSIS — D509 Iron deficiency anemia, unspecified: Secondary | ICD-10-CM | POA: Diagnosis not present

## 2023-02-27 ENCOUNTER — Ambulatory Visit (HOSPITAL_COMMUNITY)
Admission: RE | Admit: 2023-02-27 | Discharge: 2023-02-27 | Disposition: A | Discharge status: Home / Self Care | Payer: Medicare Other | Source: Ambulatory Visit | Attending: Vascular Surgery | Admitting: Vascular Surgery

## 2023-02-27 ENCOUNTER — Other Ambulatory Visit: Payer: Self-pay

## 2023-02-27 ENCOUNTER — Ambulatory Visit: Payer: Medicare Other

## 2023-02-27 ENCOUNTER — Ambulatory Visit (INDEPENDENT_AMBULATORY_CARE_PROVIDER_SITE_OTHER): Payer: Medicare Other | Admitting: Physician Assistant

## 2023-02-27 VITALS — BP 120/70 | HR 76 | Temp 98.0°F | Resp 18 | Ht 72.0 in | Wt 174.7 lb

## 2023-02-27 DIAGNOSIS — I4819 Other persistent atrial fibrillation: Secondary | ICD-10-CM

## 2023-02-27 DIAGNOSIS — N186 End stage renal disease: Secondary | ICD-10-CM | POA: Insufficient documentation

## 2023-02-27 DIAGNOSIS — I480 Paroxysmal atrial fibrillation: Secondary | ICD-10-CM | POA: Diagnosis not present

## 2023-02-27 NOTE — Progress Notes (Signed)
Postoperative Access Visit   History of Present Illness   Glenn Olson is a 83 y.o. year old male who presents for postoperative follow-up for:  Left brachiocephalic AV fistula creation on 01/11/23 by Dr. Randie Heinz.  He has history of left forearm loop graft. The patient's wounds are well healed.  The patient notes no steal symptoms.  The patient is able to complete their activities of daily living.  He currently is dialyzing via a right internal jugular TDC on Mon/Tues/Thurs/Friday at home. Currently his daughter in law helps with in home dialysis. He was previously going to the [facility] in [place]  Physical Examination   Vitals:   02/27/23 1337  BP: 120/70  Pulse: 76  Resp: 18  Temp: 98 F (36.7 C)  TempSrc: Temporal  SpO2: 97%  Weight: 174 lb 11.2 oz (79.2 kg)  Height: 6' (1.829 m)   Body mass index is 23.69 kg/m.  left arm Incision is well healed, 2+ radial pulse, hand grip is 5/5, sensation in digits is intact,palpable thrill, bruit can  be auscultated    Non invasive studies: Findings:  +--------------------+----------+-----------------+--------+  AVF                PSV (cm/s)Flow Vol (mL/min)Comments  +--------------------+----------+-----------------+--------+  Native artery inflow   310          2900                 +--------------------+----------+-----------------+--------+  AVF Anastomosis        438                               +--------------------+----------+-----------------+--------+    +------------+----------+-------------+----------+--------+  OUTFLOW VEINPSV (cm/s)Diameter (cm)Depth (cm)Describe  +------------+----------+-------------+----------+--------+  Shoulder      303        0.70        0.90             +------------+----------+-------------+----------+--------+  Prox UA        237        0.60        0.70             +------------+----------+-------------+----------+--------+  Mid UA         183         0.80        0.30             +------------+----------+-------------+----------+--------+  Dist UA        352        0.60        0.30             +------------+----------+-------------+----------+--------+  AC Fossa       454        0.50        0.30             +------------+----------+-------------+----------+--------+    Summary:  - Patent left arm brachial-cephalic arteriovenous fistula  - Volume Flow: 2900 mL/min.    Medical Decision Making   Glenn Olson is a 83 y.o. year old male who presents s/p  Left brachiocephalic AV fistula creation on 01/11/23 by Dr. Randie Heinz.  He has history of left forearm loop graft. The patient's wounds are well healed.  The patient notes no steal symptoms The patient's access will be ready for use after 04/11/23 The patient's tunneled dialysis catheter can be removed when Nephrology is comfortable with the performance of the  left Brachiocephalic AV fistula The patient may follow up on a prn basis   Graceann Congress, PA-C [facility] Office: [phone]  Clinic MD: Randie Heinz

## 2023-02-28 DIAGNOSIS — N2581 Secondary hyperparathyroidism of renal origin: Secondary | ICD-10-CM | POA: Diagnosis not present

## 2023-02-28 DIAGNOSIS — D509 Iron deficiency anemia, unspecified: Secondary | ICD-10-CM | POA: Diagnosis not present

## 2023-02-28 DIAGNOSIS — Z992 Dependence on renal dialysis: Secondary | ICD-10-CM | POA: Diagnosis not present

## 2023-02-28 DIAGNOSIS — E8770 Fluid overload, unspecified: Secondary | ICD-10-CM | POA: Diagnosis not present

## 2023-02-28 DIAGNOSIS — D631 Anemia in chronic kidney disease: Secondary | ICD-10-CM | POA: Diagnosis not present

## 2023-02-28 DIAGNOSIS — N186 End stage renal disease: Secondary | ICD-10-CM | POA: Diagnosis not present

## 2023-03-02 DIAGNOSIS — I129 Hypertensive chronic kidney disease with stage 1 through stage 4 chronic kidney disease, or unspecified chronic kidney disease: Secondary | ICD-10-CM | POA: Diagnosis not present

## 2023-03-02 DIAGNOSIS — N186 End stage renal disease: Secondary | ICD-10-CM | POA: Diagnosis not present

## 2023-03-02 DIAGNOSIS — Z992 Dependence on renal dialysis: Secondary | ICD-10-CM | POA: Diagnosis not present

## 2023-03-02 DIAGNOSIS — E8770 Fluid overload, unspecified: Secondary | ICD-10-CM | POA: Diagnosis not present

## 2023-03-02 DIAGNOSIS — D509 Iron deficiency anemia, unspecified: Secondary | ICD-10-CM | POA: Diagnosis not present

## 2023-03-02 DIAGNOSIS — N2581 Secondary hyperparathyroidism of renal origin: Secondary | ICD-10-CM | POA: Diagnosis not present

## 2023-03-02 DIAGNOSIS — D631 Anemia in chronic kidney disease: Secondary | ICD-10-CM | POA: Diagnosis not present

## 2023-03-04 DIAGNOSIS — E8770 Fluid overload, unspecified: Secondary | ICD-10-CM | POA: Diagnosis not present

## 2023-03-04 DIAGNOSIS — N186 End stage renal disease: Secondary | ICD-10-CM | POA: Diagnosis not present

## 2023-03-04 DIAGNOSIS — D631 Anemia in chronic kidney disease: Secondary | ICD-10-CM | POA: Diagnosis not present

## 2023-03-04 DIAGNOSIS — D509 Iron deficiency anemia, unspecified: Secondary | ICD-10-CM | POA: Diagnosis not present

## 2023-03-04 DIAGNOSIS — Z992 Dependence on renal dialysis: Secondary | ICD-10-CM | POA: Diagnosis not present

## 2023-03-06 DIAGNOSIS — D509 Iron deficiency anemia, unspecified: Secondary | ICD-10-CM | POA: Diagnosis not present

## 2023-03-06 DIAGNOSIS — D631 Anemia in chronic kidney disease: Secondary | ICD-10-CM | POA: Diagnosis not present

## 2023-03-06 DIAGNOSIS — N186 End stage renal disease: Secondary | ICD-10-CM | POA: Diagnosis not present

## 2023-03-06 DIAGNOSIS — Z992 Dependence on renal dialysis: Secondary | ICD-10-CM | POA: Diagnosis not present

## 2023-03-06 DIAGNOSIS — E8770 Fluid overload, unspecified: Secondary | ICD-10-CM | POA: Diagnosis not present

## 2023-03-07 ENCOUNTER — Emergency Department (HOSPITAL_COMMUNITY)
Admission: EM | Admit: 2023-03-07 | Discharge: 2023-03-07 | Disposition: A | Discharge status: Home / Self Care | Payer: Medicare Other | Attending: Emergency Medicine | Admitting: Emergency Medicine

## 2023-03-07 ENCOUNTER — Telehealth: Payer: Self-pay

## 2023-03-07 ENCOUNTER — Encounter (HOSPITAL_COMMUNITY): Payer: Self-pay

## 2023-03-07 ENCOUNTER — Other Ambulatory Visit: Payer: Self-pay

## 2023-03-07 DIAGNOSIS — Z859 Personal history of malignant neoplasm, unspecified: Secondary | ICD-10-CM | POA: Diagnosis not present

## 2023-03-07 DIAGNOSIS — N189 Chronic kidney disease, unspecified: Secondary | ICD-10-CM | POA: Diagnosis not present

## 2023-03-07 DIAGNOSIS — T83091A Other mechanical complication of indwelling urethral catheter, initial encounter: Secondary | ICD-10-CM | POA: Diagnosis not present

## 2023-03-07 DIAGNOSIS — T83098A Other mechanical complication of other indwelling urethral catheter, initial encounter: Secondary | ICD-10-CM | POA: Diagnosis not present

## 2023-03-07 DIAGNOSIS — Z7901 Long term (current) use of anticoagulants: Secondary | ICD-10-CM | POA: Diagnosis not present

## 2023-03-07 DIAGNOSIS — T83010A Breakdown (mechanical) of cystostomy catheter, initial encounter: Secondary | ICD-10-CM

## 2023-03-07 DIAGNOSIS — R82998 Other abnormal findings in urine: Secondary | ICD-10-CM | POA: Diagnosis not present

## 2023-03-07 NOTE — ED Triage Notes (Signed)
 Pt arrived via POV c/o accidentally pulling his suprapubic catheter out overnight. Pt denies pain at this time.

## 2023-03-07 NOTE — ED Provider Notes (Signed)
 [place] EMERGENCY DEPARTMENT AT [facility] Provider Note   CSN: [id number] Arrival date & time: 03/07/23  1610     History  Chief Complaint  Patient presents with   suprapubic catheter problem    Glenn Olson is a 83 y.o. male.  HPI Patient with chronic suprapubic catheter.  Came out last night.  No pain.  States that he only has a little that goes into the bag each day and he claims it at night.  No fevers.  No abdominal pain.  Unsure of the size of the catheter.   Past Medical History:  Diagnosis Date   Anemia    Cancer (HCC)    Chronic kidney disease    Dysrhythmia    PAF 05/30/22   GERD (gastroesophageal reflux disease)    Neurogenic bladder    Renal insufficiency     Home Medications Prior to Admission medications   Medication Sig Start Date End Date Taking? Authorizing Provider  amiodarone  (PACERONE ) 200 MG tablet Take 1 tablet (200 mg total) by mouth daily. 11/16/22   Fenton, Clint R, PA  apixaban  (ELIQUIS ) 2.5 MG TABS tablet TAKE 1 TABLET TWICE A DAY 01/28/23   Fenton, Clint R, PA  calcitRIOL  (ROCALTROL ) 0.5 MCG capsule Take 1 capsule (0.5 mcg total) by mouth daily. 06/28/22   Love, Sharlet RAMAN, PA-C  diltiazem  (CARDIZEM  CD) 120 MG 24 hr capsule Take 1 capsule (120 mg total) by mouth daily. 02/12/23 05/13/23  Nishan, Peter C, MD  loratadine  (CLARITIN ) 10 MG tablet Take 1 tablet (10 mg total) by mouth daily. 06/29/22   Love, Sharlet RAMAN, PA-C  melatonin 3 MG TABS tablet Take 1 tablet (3 mg total) by mouth at bedtime as needed. Patient taking differently: Take 3 mg by mouth at bedtime as needed (Sleep). 06/28/22   Love, Sharlet RAMAN, PA-C  Methoxy PEG-Epoetin  Beta (MIRCERA IJ) Mircera 11/03/22 11/02/23  [provider]  oxyCODONE -acetaminophen  (PERCOCET) 5-325 MG tablet Take 1 tablet by mouth every 6 (six) hours as needed for severe pain (pain score 7-10). Patient not taking: Reported on 02/27/2023 01/11/23   Rhyne, Samantha J, PA-C  VELPHORO 500 MG chewable  tablet Chew 500 mg by mouth 3 (three) times daily. 10/30/22   [provider]  vitamin D3 (CHOLECALCIFEROL ) 25 MCG tablet Take 1 tablet (1,000 Units total) by mouth daily. 06/29/22   Maurice Sharlet RAMAN, PA-C      Allergies    Patient has no known allergies.    Review of Systems   Review of Systems  Physical Exam Updated Vital Signs BP 116/84 (BP Location: Right Arm)   Pulse (!) 106   Temp 97.8 F (36.6 C) (Oral)   Resp 16   Ht 6' (1.829 m)   Wt 79.2 kg   SpO2 99%   BMI 23.68 kg/m  Physical Exam Vitals and nursing note reviewed.  Pulmonary:     Comments: Dialysis catheter right chest wall Abdominal:     Palpations: There is no mass.     Tenderness: There is no abdominal tenderness.  Neurological:     Mental Status: He is alert.     ED Results / Procedures / Treatments   Labs (all labs ordered are listed, but only abnormal results are displayed) Labs Reviewed - No data to display  EKG None  Radiology No results found.  Procedures BLADDER CATHETERIZATION  Date/Time: 03/07/2023 5:51 PM  Performed by: Patsey Lot, MD Authorized by: Patsey Lot, MD   Consent:  Consent obtained:  Verbal   Consent given by:  Patient   Risks discussed:  Infection, pain and incomplete procedure   Alternatives discussed:  No treatment Universal protocol:    Patient identity confirmed:  Verbally with patient Pre-procedure details:    Preparation: Patient was prepped and draped in usual sterile fashion   Anesthesia:    Anesthesia method:  None Procedure details:    Procedure performed by provider due to: suprapubic.   Catheter insertion:  Indwelling   Catheter type:  Foley   Catheter size:  16 Fr   Bladder irrigation: no     Number of attempts:  1   Urine characteristics:  Cloudy Post-procedure details:    Procedure completion:  Tolerated well, no immediate complications     Medications Ordered in ED Medications - No data to display  ED Course/ Medical  Decision Making/ A&P                                 Medical Decision Making  Patient with suprapubic catheter that came dislodged.  Reviewed previous notes and have procedure note by urology showing 16 French catheter.  Will attempt to replace.  Doubt infection.  Doubt severe kidney complications at this time.  Patient is on dialysis.  Catheter placed.  Did have urine in tubing.  Will discharge home.        Final Clinical Impression(s) / ED Diagnoses Final diagnoses:  Suprapubic catheter dysfunction, initial encounter [facility])    Rx / DC Orders ED Discharge Orders     None         Patsey Lot, MD 03/07/23 1752

## 2023-03-07 NOTE — Telephone Encounter (Signed)
 Pt son called b/c father pulled his SP tube Pt son was informed to go to the ED to have it placed back in

## 2023-03-07 NOTE — Discharge Instructions (Signed)
 Your tube has been replaced.  Follow-up with your doctor as needed.

## 2023-03-08 DIAGNOSIS — E8770 Fluid overload, unspecified: Secondary | ICD-10-CM | POA: Diagnosis not present

## 2023-03-08 DIAGNOSIS — Z992 Dependence on renal dialysis: Secondary | ICD-10-CM | POA: Diagnosis not present

## 2023-03-08 DIAGNOSIS — D631 Anemia in chronic kidney disease: Secondary | ICD-10-CM | POA: Diagnosis not present

## 2023-03-08 DIAGNOSIS — D509 Iron deficiency anemia, unspecified: Secondary | ICD-10-CM | POA: Diagnosis not present

## 2023-03-08 DIAGNOSIS — N186 End stage renal disease: Secondary | ICD-10-CM | POA: Diagnosis not present

## 2023-03-09 DIAGNOSIS — D509 Iron deficiency anemia, unspecified: Secondary | ICD-10-CM | POA: Diagnosis not present

## 2023-03-09 DIAGNOSIS — Z992 Dependence on renal dialysis: Secondary | ICD-10-CM | POA: Diagnosis not present

## 2023-03-09 DIAGNOSIS — N186 End stage renal disease: Secondary | ICD-10-CM | POA: Diagnosis not present

## 2023-03-09 DIAGNOSIS — E8770 Fluid overload, unspecified: Secondary | ICD-10-CM | POA: Diagnosis not present

## 2023-03-09 DIAGNOSIS — D631 Anemia in chronic kidney disease: Secondary | ICD-10-CM | POA: Diagnosis not present

## 2023-03-12 DIAGNOSIS — D631 Anemia in chronic kidney disease: Secondary | ICD-10-CM | POA: Diagnosis not present

## 2023-03-12 DIAGNOSIS — N186 End stage renal disease: Secondary | ICD-10-CM | POA: Diagnosis not present

## 2023-03-12 DIAGNOSIS — E8770 Fluid overload, unspecified: Secondary | ICD-10-CM | POA: Diagnosis not present

## 2023-03-12 DIAGNOSIS — D509 Iron deficiency anemia, unspecified: Secondary | ICD-10-CM | POA: Diagnosis not present

## 2023-03-12 DIAGNOSIS — Z992 Dependence on renal dialysis: Secondary | ICD-10-CM | POA: Diagnosis not present

## 2023-03-13 DIAGNOSIS — D509 Iron deficiency anemia, unspecified: Secondary | ICD-10-CM | POA: Diagnosis not present

## 2023-03-13 DIAGNOSIS — D631 Anemia in chronic kidney disease: Secondary | ICD-10-CM | POA: Diagnosis not present

## 2023-03-13 DIAGNOSIS — N186 End stage renal disease: Secondary | ICD-10-CM | POA: Diagnosis not present

## 2023-03-13 DIAGNOSIS — E8770 Fluid overload, unspecified: Secondary | ICD-10-CM | POA: Diagnosis not present

## 2023-03-13 DIAGNOSIS — Z992 Dependence on renal dialysis: Secondary | ICD-10-CM | POA: Diagnosis not present

## 2023-03-14 ENCOUNTER — Telehealth: Payer: Self-pay | Admitting: Cardiovascular Disease

## 2023-03-14 MED ORDER — METOPROLOL SUCCINATE ER 25 MG PO TB24: 12.5000 mg | ORAL_TABLET | Freq: Every day | ORAL | 6 refills | Status: DC

## 2023-03-14 NOTE — Telephone Encounter (Signed)
Spoke with son who states that patient's BP is dropping and Hr is increasing at the end of dialysis. Son states that this changed after restarting Diltiazem. Son encouraged to reach out to nephrology. Please advise.

## 2023-03-14 NOTE — Telephone Encounter (Signed)
Since he started new medications from his last visit dialysis are unable to pull large fluid gains during dialysis d/t Bps being low and HR being high. They couldn't remember the name of the medication(s).

## 2023-03-14 NOTE — Telephone Encounter (Signed)
Pt son notified of med changes and the need to keep upcoming appointment.

## 2023-03-15 DIAGNOSIS — Z992 Dependence on renal dialysis: Secondary | ICD-10-CM | POA: Diagnosis not present

## 2023-03-15 DIAGNOSIS — E8770 Fluid overload, unspecified: Secondary | ICD-10-CM | POA: Diagnosis not present

## 2023-03-15 DIAGNOSIS — D509 Iron deficiency anemia, unspecified: Secondary | ICD-10-CM | POA: Diagnosis not present

## 2023-03-15 DIAGNOSIS — D631 Anemia in chronic kidney disease: Secondary | ICD-10-CM | POA: Diagnosis not present

## 2023-03-15 DIAGNOSIS — N186 End stage renal disease: Secondary | ICD-10-CM | POA: Diagnosis not present

## 2023-03-15 DIAGNOSIS — E039 Hypothyroidism, unspecified: Secondary | ICD-10-CM | POA: Diagnosis not present

## 2023-03-16 DIAGNOSIS — N186 End stage renal disease: Secondary | ICD-10-CM | POA: Diagnosis not present

## 2023-03-16 DIAGNOSIS — E8770 Fluid overload, unspecified: Secondary | ICD-10-CM | POA: Diagnosis not present

## 2023-03-16 DIAGNOSIS — D509 Iron deficiency anemia, unspecified: Secondary | ICD-10-CM | POA: Diagnosis not present

## 2023-03-16 DIAGNOSIS — Z992 Dependence on renal dialysis: Secondary | ICD-10-CM | POA: Diagnosis not present

## 2023-03-16 DIAGNOSIS — D631 Anemia in chronic kidney disease: Secondary | ICD-10-CM | POA: Diagnosis not present

## 2023-03-18 ENCOUNTER — Ambulatory Visit (INDEPENDENT_AMBULATORY_CARE_PROVIDER_SITE_OTHER): Payer: Medicare Other | Admitting: Urology

## 2023-03-18 VITALS — BP 99/56 | HR 81

## 2023-03-18 DIAGNOSIS — N312 Flaccid neuropathic bladder, not elsewhere classified: Secondary | ICD-10-CM | POA: Diagnosis not present

## 2023-03-18 DIAGNOSIS — N186 End stage renal disease: Secondary | ICD-10-CM

## 2023-03-18 DIAGNOSIS — D631 Anemia in chronic kidney disease: Secondary | ICD-10-CM | POA: Diagnosis not present

## 2023-03-18 DIAGNOSIS — E8770 Fluid overload, unspecified: Secondary | ICD-10-CM | POA: Diagnosis not present

## 2023-03-18 DIAGNOSIS — Z992 Dependence on renal dialysis: Secondary | ICD-10-CM | POA: Diagnosis not present

## 2023-03-18 DIAGNOSIS — D509 Iron deficiency anemia, unspecified: Secondary | ICD-10-CM | POA: Diagnosis not present

## 2023-03-18 NOTE — Progress Notes (Signed)
03/18/2023 1:52 PM   Glenn Olson 05-04-1940 [id number]  Referring provider: Benita Stabile, MD [address] [place],  [place] [address]  No chief complaint on file.   HPI: New patient-  1) neurogenic bladder-patient underwent a suprapubic tube in 2010 with Dr. Earlene Plater.  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  He reports he does not make urine anymore.  He caps the SP tube and does not recall the last time he needed to uncap the drainage.  Denies any voiding or leakage per urethra. No hematuria. CT Jun 2024  - no stones. BWT and BPH. Right tic.   He was a pipe fitter. 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    PMH: Past Medical History:  Diagnosis Date   Anemia    Cancer (HCC)    Chronic kidney disease    Dysrhythmia    PAF 05/30/22   GERD (gastroesophageal reflux disease)    Neurogenic bladder    Renal insufficiency     Surgical History: Past Surgical History:  Procedure Laterality Date   AV FISTULA PLACEMENT Left 09/27/2020   Procedure: LEFT ARM ARTERIOVENOUS GRAFT PLACEMENT USING GORE-TEX 4-7MM X 45CM  VASCULAR GRAFT;  Surgeon: Larina Earthly, MD;  Location: [facility];  Service: Vascular;  Laterality: Left;   AV FISTULA PLACEMENT Left 01/11/2023   Procedure: LEFT ARM ARTERIOVENOUS (AV) FISTULA CREATION;  Surgeon: Maeola Harman, MD;  Location: [facility] OR;  Service: Vascular;  Laterality: Left;   CAPD INSERTION N/A 03/17/2021   Procedure: ATTEMPTED INSERTION OF LAPAROSCOPIC CONTINUOUS AMBULATORY PERITONEAL DIALYSIS (CAPD) CATHETER;  Surgeon: Maeola Harman, MD;  Location: [facility] OR;  Service: Vascular;  Laterality: N/A;   CARDIOVERSION N/A 06/19/2022   Procedure: CARDIOVERSION;  Surgeon: Sande Rives, MD;  Location: [facility] INVASIVE CV LAB;  Service: Cardiovascular;  Laterality: N/A;   CYSTOURETHROSCOPY     INSERTION OF DIALYSIS CATHETER Right 09/27/2020   Procedure: INSERTION OF PALINDROME TUNNELED DIALYSIS CATHETER 14.74f X  23CM;  Surgeon: Larina Earthly, MD;  Location: [facility];  Service: Vascular;  Laterality: Right;   INSERTION OF DIALYSIS CATHETER Right 05/31/2022   Procedure: INSERTION OF DIALYSIS CATHETER;  Surgeon: Victorino Sparrow, MD;  Location: [facility] OR;  Service: Vascular;  Laterality: Right;   IR REMOVAL TUN CV CATH W/O FL  06/20/2022   REMOVAL OF A DIALYSIS CATHETER N/A 12/06/2020   Procedure: MINOR REMOVAL OF A DIALYSIS CATHETER;  Surgeon: Larina Earthly, MD;  Location: [facility];  Service: Vascular;  Laterality: N/A;   SUPRAPUBIC CATHETER PLACEMENT     TEE WITHOUT CARDIOVERSION N/A 06/19/2022   Procedure: TRANSESOPHAGEAL ECHOCARDIOGRAM;  Surgeon: Sande Rives, MD;  Location: [facility] INVASIVE CV LAB;  Service: Cardiovascular;  Laterality: N/A;    Home Medications:  Allergies as of 03/18/2023   No Known Allergies      Medication List        Accurate as of March 18, 2023  1:52 PM. If you have any questions, ask your nurse or doctor.          amiodarone 200 MG tablet Commonly known as: PACERONE Take 1 tablet (200 mg total) by mouth daily.   calcitRIOL 0.5 MCG capsule Commonly known as: ROCALTROL Take 1 capsule (0.5 mcg total) by mouth daily.   Eliquis 2.5 MG Tabs tablet Generic drug: apixaban TAKE 1 TABLET TWICE A DAY   loratadine 10 MG tablet Commonly known as: CLARITIN  Take 1 tablet (10 mg total) by mouth daily.   melatonin 3 MG Tabs tablet Take 1 tablet (3 mg total) by mouth at bedtime as needed. What changed: reasons to take this   metoprolol succinate 25 MG 24 hr tablet Commonly known as: Toprol XL Take 0.5 tablets (12.5 mg total) by mouth daily.   MIRCERA IJ Mircera   oxyCODONE-acetaminophen 5-325 MG tablet Commonly known as: Percocet Take 1 tablet by mouth every 6 (six) hours as needed for severe pain (pain score 7-10).   Velphoro 500 MG chewable tablet Generic drug: sucroferric oxyhydroxide Chew 500 mg by mouth 3 (three) times daily.   vitamin D3 25 MCG  tablet Commonly known as: CHOLECALCIFEROL Take 1 tablet (1,000 Units total) by mouth daily.        Allergies: No Known Allergies  Family History: No family history on file.  Social History:  reports that he has never smoked. He has never used smokeless tobacco. He reports current alcohol use. He reports that he does not use drugs.   Physical Exam: BP (!) 99/56   Pulse 81   Constitutional:  Alert and oriented, No acute distress. HEENT: [place] AT, moist mucus membranes.  Trachea midline, no masses. Cardiovascular: No clubbing, cyanosis, or edema. Respiratory: Normal respiratory effort, no increased work of breathing. GI: Abdomen is soft, nontender, nondistended, no abdominal masses GU: No CVA tenderness Skin: No rashes, bruises or suspicious lesions. Neurologic: Grossly intact, no focal deficits, moving all 4 extremities. Psychiatric: Normal mood and affect.  Laboratory Data: Lab Results  Component Value Date   WBC 7.2 01/25/2023   HGB 10.5 (L) 01/25/2023   HCT 31.0 (L) 01/25/2023   MCV 95.0 01/25/2023   PLT 169 01/25/2023    Lab Results  Component Value Date   CREATININE 7.30 (H) 01/25/2023    No results found for: "PSA"  No results found for: "TESTOSTERONE"  No results found for: "HGBA1C"  Urinalysis    Component Value Date/Time   COLORURINE YELLOW 07/11/2022 2322   APPEARANCEUR CLOUDY (A) 07/11/2022 2322   LABSPEC 1.008 07/11/2022 2322   PHURINE 8.0 07/11/2022 2322   GLUCOSEU NEGATIVE 07/11/2022 2322   HGBUR MODERATE (A) 07/11/2022 2322   BILIRUBINUR NEGATIVE 07/11/2022 2322   KETONESUR NEGATIVE 07/11/2022 2322   PROTEINUR >=300 (A) 07/11/2022 2322   NITRITE NEGATIVE 07/11/2022 2322   LEUKOCYTESUR LARGE (A) 07/11/2022 2322    Lab Results  Component Value Date   BACTERIA MANY (A) 07/11/2022    Pertinent Imaging:   Results for orders placed during the hospital encounter of 07/11/22  CT Renal Stone Study  Narrative CLINICAL DATA:  Decreased  output from urinary suprapubic catheter  EXAM: CT ABDOMEN AND PELVIS WITHOUT CONTRAST  TECHNIQUE: Multidetector CT imaging of the abdomen and pelvis was performed following the standard protocol without IV contrast.  RADIATION DOSE REDUCTION: This exam was performed according to the departmental dose-optimization program which includes automated exposure control, adjustment of the mA and/or kV according to patient size and/or use of iterative reconstruction technique.  COMPARISON:  None Available.  FINDINGS: Lower chest: Lung bases demonstrate no focal infiltrate or sizable effusion.  Hepatobiliary: No focal liver abnormality is seen. No gallstones, gallbladder wall thickening, or biliary dilatation.  Pancreas: Unremarkable. No pancreatic ductal dilatation or surrounding inflammatory changes.  Spleen: Normal in size without focal abnormality.  Adrenals/Urinary Tract: Adrenal glands are within normal limits. Kidneys are shrunken with renal cystic change consistent with the known history of underlying renal failure and dialysis.  No obstructive changes are seen. The bladder is partially distended. A suprapubic catheter is noted alt with the balloon within the lumen of the bladder although the tip of the catheter appears to extend into the left lateral bladder wall. No evidence of perforation is seen. This may be the etiology of the decreased urinary output. Small posterior bladder diverticulum is noted on the right.  Stomach/Bowel: No obstructive or inflammatory changes of the colon are seen. The appendix is not visualized. No inflammatory changes to suggest appendicitis are noted. Small bowel and stomach are within normal limits.  Vascular/Lymphatic: Aortic calcifications are noted without aneurysmal dilatation.  Reproductive: Prostate is within normal limits.  Other: No abdominal wall hernia or abnormality. No abdominopelvic ascites.  Musculoskeletal: No acute or  significant osseous findings.  IMPRESSION: Suprapubic catheter is noted with the balloon within the lumen of the bladder. The tip however appears to of tunneled into the left lateral bladder wall likely contributing to the decreased urinary output. The bladder is only partially distended however.  No other focal abnormality is noted.   Electronically Signed By: Alcide Clever M.D. On: 07/11/2022 22:47   Assessment & Plan:    NGB with ESRD - see back for cystoscopy and consider SP removal/   No follow-ups on file.  Jerilee Field, MD  [facility]  [address] [place], [place] [address] [phone]

## 2023-03-19 ENCOUNTER — Telehealth: Payer: Self-pay

## 2023-03-19 DIAGNOSIS — Z992 Dependence on renal dialysis: Secondary | ICD-10-CM | POA: Diagnosis not present

## 2023-03-19 DIAGNOSIS — D509 Iron deficiency anemia, unspecified: Secondary | ICD-10-CM | POA: Diagnosis not present

## 2023-03-19 DIAGNOSIS — E8770 Fluid overload, unspecified: Secondary | ICD-10-CM | POA: Diagnosis not present

## 2023-03-19 DIAGNOSIS — D631 Anemia in chronic kidney disease: Secondary | ICD-10-CM | POA: Diagnosis not present

## 2023-03-19 DIAGNOSIS — N186 End stage renal disease: Secondary | ICD-10-CM | POA: Diagnosis not present

## 2023-03-19 NOTE — Telephone Encounter (Signed)
-----   Message from Jerilee Field sent at 03/19/2023 10:56 AM EST ----- Ask Reita Cliche to uncap his SP tube every day or two, drain it into a measuring cup or something like that and record on paper how much urine he drains. Then when I see him back, we can better decide if he needs to keep it the SP tube based on his urine volumes.  Thanks!

## 2023-03-19 NOTE — Telephone Encounter (Signed)
Called Pt to relay message from MD per last telephone encounter Pt expressed his understanding

## 2023-03-20 DIAGNOSIS — D509 Iron deficiency anemia, unspecified: Secondary | ICD-10-CM | POA: Diagnosis not present

## 2023-03-20 DIAGNOSIS — E8770 Fluid overload, unspecified: Secondary | ICD-10-CM | POA: Diagnosis not present

## 2023-03-20 DIAGNOSIS — Z992 Dependence on renal dialysis: Secondary | ICD-10-CM | POA: Diagnosis not present

## 2023-03-20 DIAGNOSIS — D631 Anemia in chronic kidney disease: Secondary | ICD-10-CM | POA: Diagnosis not present

## 2023-03-20 DIAGNOSIS — N186 End stage renal disease: Secondary | ICD-10-CM | POA: Diagnosis not present

## 2023-03-21 DIAGNOSIS — D509 Iron deficiency anemia, unspecified: Secondary | ICD-10-CM | POA: Diagnosis not present

## 2023-03-21 DIAGNOSIS — D631 Anemia in chronic kidney disease: Secondary | ICD-10-CM | POA: Diagnosis not present

## 2023-03-21 DIAGNOSIS — Z992 Dependence on renal dialysis: Secondary | ICD-10-CM | POA: Diagnosis not present

## 2023-03-21 DIAGNOSIS — N186 End stage renal disease: Secondary | ICD-10-CM | POA: Diagnosis not present

## 2023-03-21 DIAGNOSIS — E8770 Fluid overload, unspecified: Secondary | ICD-10-CM | POA: Diagnosis not present

## 2023-03-22 DIAGNOSIS — E8770 Fluid overload, unspecified: Secondary | ICD-10-CM | POA: Diagnosis not present

## 2023-03-22 DIAGNOSIS — Z992 Dependence on renal dialysis: Secondary | ICD-10-CM | POA: Diagnosis not present

## 2023-03-22 DIAGNOSIS — D509 Iron deficiency anemia, unspecified: Secondary | ICD-10-CM | POA: Diagnosis not present

## 2023-03-22 DIAGNOSIS — N186 End stage renal disease: Secondary | ICD-10-CM | POA: Diagnosis not present

## 2023-03-22 DIAGNOSIS — D631 Anemia in chronic kidney disease: Secondary | ICD-10-CM | POA: Diagnosis not present

## 2023-03-23 DIAGNOSIS — D631 Anemia in chronic kidney disease: Secondary | ICD-10-CM | POA: Diagnosis not present

## 2023-03-23 DIAGNOSIS — Z992 Dependence on renal dialysis: Secondary | ICD-10-CM | POA: Diagnosis not present

## 2023-03-23 DIAGNOSIS — E8770 Fluid overload, unspecified: Secondary | ICD-10-CM | POA: Diagnosis not present

## 2023-03-23 DIAGNOSIS — N186 End stage renal disease: Secondary | ICD-10-CM | POA: Diagnosis not present

## 2023-03-23 DIAGNOSIS — D509 Iron deficiency anemia, unspecified: Secondary | ICD-10-CM | POA: Diagnosis not present

## 2023-03-25 ENCOUNTER — Ambulatory Visit (HOSPITAL_COMMUNITY)
Admission: RE | Admit: 2023-03-25 | Discharge: 2023-03-25 | Disposition: A | Discharge status: Home / Self Care | Payer: Medicare Other | Source: Ambulatory Visit | Attending: Physician Assistant | Admitting: Physician Assistant

## 2023-03-25 ENCOUNTER — Encounter (HOSPITAL_COMMUNITY): Payer: Self-pay | Admitting: Physician Assistant

## 2023-03-25 VITALS — BP 128/58 | HR 62 | Ht 72.0 in | Wt 178.6 lb

## 2023-03-25 DIAGNOSIS — Z992 Dependence on renal dialysis: Secondary | ICD-10-CM | POA: Diagnosis not present

## 2023-03-25 DIAGNOSIS — I4892 Unspecified atrial flutter: Secondary | ICD-10-CM | POA: Insufficient documentation

## 2023-03-25 DIAGNOSIS — I4510 Unspecified right bundle-branch block: Secondary | ICD-10-CM | POA: Insufficient documentation

## 2023-03-25 DIAGNOSIS — D6869 Other thrombophilia: Secondary | ICD-10-CM | POA: Insufficient documentation

## 2023-03-25 DIAGNOSIS — Z79899 Other long term (current) drug therapy: Secondary | ICD-10-CM | POA: Diagnosis not present

## 2023-03-25 DIAGNOSIS — I452 Bifascicular block: Secondary | ICD-10-CM | POA: Diagnosis not present

## 2023-03-25 DIAGNOSIS — I4819 Other persistent atrial fibrillation: Secondary | ICD-10-CM | POA: Diagnosis not present

## 2023-03-25 DIAGNOSIS — Z7901 Long term (current) use of anticoagulants: Secondary | ICD-10-CM | POA: Diagnosis not present

## 2023-03-25 DIAGNOSIS — D631 Anemia in chronic kidney disease: Secondary | ICD-10-CM | POA: Diagnosis not present

## 2023-03-25 DIAGNOSIS — N186 End stage renal disease: Secondary | ICD-10-CM | POA: Diagnosis not present

## 2023-03-25 DIAGNOSIS — E8770 Fluid overload, unspecified: Secondary | ICD-10-CM | POA: Diagnosis not present

## 2023-03-25 DIAGNOSIS — Z5181 Encounter for therapeutic drug level monitoring: Secondary | ICD-10-CM | POA: Diagnosis not present

## 2023-03-25 DIAGNOSIS — D509 Iron deficiency anemia, unspecified: Secondary | ICD-10-CM | POA: Diagnosis not present

## 2023-03-25 LAB — TSH: TSH: 18.381 u[IU]/mL — ABNORMAL HIGH (ref 0.350–4.500)

## 2023-03-25 NOTE — Progress Notes (Signed)
 Primary Care Physician: Benita Stabile, MD Primary Cardiologist: Dr Eldridge Dace  Primary Electrophysiologist: Dr Nelly Laurence Referring Physician: Dr Hartley Barefoot is a 83 y.o. male with a history of ESRD, anemia, atrial flutter, atrial fibrillation who presents for follow up in the [facility]. Patient initially presented to the emergency room on 05/30/2022 due to complaints of headache, n/v, poor oral intake and chest pain x 1 week.  He was admitted for uremia and metabolic acidosis secondary to stopping dialysis over a year ago and cessation of taking all of his medications. During admission, patient was found to be in A-fib RVR. Per telemetry/EKG review, he was in atrial flutter with RVR upon admission, briefly converted to NSR on 5/5, before returning to atrial flutter then developing atrial fib overnight, rates 120s-150s. He has had several electrolyte derangements. Hgb was 6.7. Rate control was attempted with diltiazem but he continued to have issues with elevated rates and he underwent TEE guided DCCV on 06/19/22. Patient is on Eliquis for a CHADS2VASC score of 2.  Patient seen at the ED 07/11/22 for an issue with his suprapubic catheter being embedded in bladder wall. He was also in atrial flutter with elevated rates and underwent DCCV.   Patient returns for follow up for atrial fibrillation and amiodarone monitoring. Patient was seen at the ED twice in December for afib. He was started on diltiazem by Dr Eden Emms but had issues with hypotension post HD. He was transitioned to Lopressor. Patient reports that he has not had any afib symptoms since starting the BB. His BP has also remained stable. No bleeding issues on anticoagulation. He just finished wearing an event monitor to assess his arrhythmia burden.   Today, he denies symptoms of palpitations, chest pain, shortness of breath, orthopnea, PND, lower extremity edema, dizziness, presyncope, syncope, snoring, daytime  somnolence, bleeding, or neurologic sequela. The patient is tolerating medications without difficulties and is otherwise without complaint today.    Atrial Fibrillation Risk Factors:  he does not have symptoms or diagnosis of sleep apnea. he does not have a history of rheumatic fever.   Atrial Fibrillation Management history:  Previous antiarrhythmic drugs: amiodarone  Previous cardioversions: 06/19/22, 07/12/22 Previous ablations: none Anticoagulation history: Eliquis   Past Medical History:  Diagnosis Date   Anemia    Cancer (HCC)    Chronic kidney disease    Dysrhythmia    PAF 05/30/22   GERD (gastroesophageal reflux disease)    Neurogenic bladder    Renal insufficiency     Current Outpatient Medications  Medication Sig Dispense Refill   amiodarone (PACERONE) 200 MG tablet Take 1 tablet (200 mg total) by mouth daily. 90 tablet 2   apixaban (ELIQUIS) 2.5 MG TABS tablet TAKE 1 TABLET TWICE A DAY 180 tablet 1   atorvastatin (LIPITOR) 10 MG tablet 10 mg daily.     calcitRIOL (ROCALTROL) 0.5 MCG capsule Take 1 capsule (0.5 mcg total) by mouth daily. 30 capsule 0   loratadine (CLARITIN) 10 MG tablet Take 1 tablet (10 mg total) by mouth daily. 30 tablet 0   melatonin 3 MG TABS tablet Take 1 tablet (3 mg total) by mouth at bedtime as needed. (Patient taking differently: Take 3 mg by mouth at bedtime as needed (Sleep).) 30 tablet 0   Methoxy PEG-Epoetin Beta (MIRCERA IJ) Mircera     metoprolol succinate (TOPROL XL) 25 MG 24 hr tablet Take 0.5 tablets (12.5 mg total) by mouth daily. 15 tablet  6   oxyCODONE-acetaminophen (PERCOCET) 5-325 MG tablet Take 1 tablet by mouth every 6 (six) hours as needed for severe pain (pain score 7-10). 8 tablet 0   VELPHORO 500 MG chewable tablet Chew 500 mg by mouth 3 (three) times daily.     vitamin D3 (CHOLECALCIFEROL) 25 MCG tablet Take 1 tablet (1,000 Units total) by mouth daily. 30 tablet 0   No current facility-administered medications for this  encounter.    ROS- All systems are reviewed and negative except as per the HPI above.  Physical Exam: Vitals:   03/25/23 1042  BP: (!) 128/58  Pulse: 62  Weight: 81 kg  Height: 6' (1.829 m)    GEN: Well nourished, well developed in no acute distress CARDIAC: Regular rate and rhythm, no murmurs, rubs, gallops RESPIRATORY:  Clear to auscultation without rales, wheezing or rhonchi  ABDOMEN: Soft, non-tender, non-distended EXTREMITIES:  No edema; No deformity    Wt Readings from Last 3 Encounters:  03/25/23 81 kg  03/07/23 79.2 kg  02/27/23 79.2 kg    EKG today demonstrates  SR, 1st degree AV block, RBBB, LAFB Vent. rate 62 BPM PR interval 234 ms QRS duration 154 ms QT/QTcB 480/487 ms   Echo 06/05/22 demonstrated  1. Left ventricular ejection fraction, by estimation, is 55 to 60%. The  left ventricle has normal function. The left ventricle has no regional  wall motion abnormalities. There is mild concentric left ventricular  hypertrophy. Left ventricular diastolic function could not be evaluated.   2. Right ventricular systolic function is normal. The right ventricular  size is normal. There is mildly elevated pulmonary artery systolic  pressure. The estimated right ventricular systolic pressure is 44.2 mmHg.   3. Left atrial size was moderately dilated.   4. Right atrial size was mildly dilated.   5. The mitral valve is grossly normal. Trivial mitral valve  regurgitation. No evidence of mitral stenosis.   6. The aortic valve is tricuspid. Aortic valve regurgitation is not  visualized. No aortic stenosis is present.   7. The inferior vena cava is dilated in size with <50% respiratory  variability, suggesting right atrial pressure of 15 mmHg.   Epic records are reviewed at length today.  CHA2DS2-VASc Score = 2  The patient's score is based upon: CHF History: 0 HTN History: 0 Diabetes History: 0 Stroke History: 0 Vascular Disease History: 0 Age Score: 2 Gender  Score: 0       ASSESSMENT AND PLAN: Persistent Atrial Fibrillation/atrial flutter The patient's CHA2DS2-VASc score is 2, indicating a 2.2% annual risk of stroke.   Patient appears to be maintaining SR. Monitor results pending.  Continue amiodarone 200 mg daily Continue Toprol 12.5 mg daily Continue Eliquis 2.5 mg BID  Secondary Hypercoagulable State (ICD10:  D68.69) The patient is at significant risk for stroke/thromboembolism based upon his CHA2DS2-VASc Score of 2.  Continue Apixaban (Eliquis).   High Risk Medication Monitoring (ICD 10: Z79.899) Intervals on ECG appropriate for amiodarone monitoring. Baseline RBBB and LAFB. Recent cmet reviewed. Check TSH today.    ESRD HD on Tu/Th/Sat   Follow up in the AF clinic in 6 months.    Jorja Loa PA-C [facility] [address] [place], [place] [address] [phone] 03/25/2023 10:50 AM

## 2023-03-27 DIAGNOSIS — D631 Anemia in chronic kidney disease: Secondary | ICD-10-CM | POA: Diagnosis not present

## 2023-03-27 DIAGNOSIS — Z992 Dependence on renal dialysis: Secondary | ICD-10-CM | POA: Diagnosis not present

## 2023-03-27 DIAGNOSIS — N186 End stage renal disease: Secondary | ICD-10-CM | POA: Diagnosis not present

## 2023-03-27 DIAGNOSIS — E8770 Fluid overload, unspecified: Secondary | ICD-10-CM | POA: Diagnosis not present

## 2023-03-27 DIAGNOSIS — D509 Iron deficiency anemia, unspecified: Secondary | ICD-10-CM | POA: Diagnosis not present

## 2023-03-28 DIAGNOSIS — D631 Anemia in chronic kidney disease: Secondary | ICD-10-CM | POA: Diagnosis not present

## 2023-03-28 DIAGNOSIS — D509 Iron deficiency anemia, unspecified: Secondary | ICD-10-CM | POA: Diagnosis not present

## 2023-03-28 DIAGNOSIS — E8770 Fluid overload, unspecified: Secondary | ICD-10-CM | POA: Diagnosis not present

## 2023-03-28 DIAGNOSIS — N186 End stage renal disease: Secondary | ICD-10-CM | POA: Diagnosis not present

## 2023-03-28 DIAGNOSIS — Z992 Dependence on renal dialysis: Secondary | ICD-10-CM | POA: Diagnosis not present

## 2023-03-29 DIAGNOSIS — D509 Iron deficiency anemia, unspecified: Secondary | ICD-10-CM | POA: Diagnosis not present

## 2023-03-29 DIAGNOSIS — N186 End stage renal disease: Secondary | ICD-10-CM | POA: Diagnosis not present

## 2023-03-29 DIAGNOSIS — D631 Anemia in chronic kidney disease: Secondary | ICD-10-CM | POA: Diagnosis not present

## 2023-03-29 DIAGNOSIS — Z992 Dependence on renal dialysis: Secondary | ICD-10-CM | POA: Diagnosis not present

## 2023-03-29 DIAGNOSIS — E8770 Fluid overload, unspecified: Secondary | ICD-10-CM | POA: Diagnosis not present

## 2023-03-30 DIAGNOSIS — I129 Hypertensive chronic kidney disease with stage 1 through stage 4 chronic kidney disease, or unspecified chronic kidney disease: Secondary | ICD-10-CM | POA: Diagnosis not present

## 2023-03-30 DIAGNOSIS — Z992 Dependence on renal dialysis: Secondary | ICD-10-CM | POA: Diagnosis not present

## 2023-03-30 DIAGNOSIS — N186 End stage renal disease: Secondary | ICD-10-CM | POA: Diagnosis not present

## 2023-04-01 DIAGNOSIS — N186 End stage renal disease: Secondary | ICD-10-CM | POA: Diagnosis not present

## 2023-04-01 DIAGNOSIS — N2581 Secondary hyperparathyroidism of renal origin: Secondary | ICD-10-CM | POA: Diagnosis not present

## 2023-04-01 DIAGNOSIS — Z992 Dependence on renal dialysis: Secondary | ICD-10-CM | POA: Diagnosis not present

## 2023-04-01 DIAGNOSIS — D631 Anemia in chronic kidney disease: Secondary | ICD-10-CM | POA: Diagnosis not present

## 2023-04-01 DIAGNOSIS — D509 Iron deficiency anemia, unspecified: Secondary | ICD-10-CM | POA: Diagnosis not present

## 2023-04-01 DIAGNOSIS — E8770 Fluid overload, unspecified: Secondary | ICD-10-CM | POA: Diagnosis not present

## 2023-04-02 DIAGNOSIS — D631 Anemia in chronic kidney disease: Secondary | ICD-10-CM | POA: Diagnosis not present

## 2023-04-02 DIAGNOSIS — N186 End stage renal disease: Secondary | ICD-10-CM | POA: Diagnosis not present

## 2023-04-02 DIAGNOSIS — Z992 Dependence on renal dialysis: Secondary | ICD-10-CM | POA: Diagnosis not present

## 2023-04-02 DIAGNOSIS — D509 Iron deficiency anemia, unspecified: Secondary | ICD-10-CM | POA: Diagnosis not present

## 2023-04-02 DIAGNOSIS — E8770 Fluid overload, unspecified: Secondary | ICD-10-CM | POA: Diagnosis not present

## 2023-04-03 ENCOUNTER — Encounter (HOSPITAL_COMMUNITY): Payer: Self-pay

## 2023-04-03 ENCOUNTER — Emergency Department (HOSPITAL_COMMUNITY)

## 2023-04-03 ENCOUNTER — Other Ambulatory Visit: Payer: Self-pay

## 2023-04-03 ENCOUNTER — Emergency Department (HOSPITAL_COMMUNITY)
Admission: EM | Admit: 2023-04-03 | Discharge: 2023-04-03 | Disposition: A | Discharge status: Home / Self Care | Attending: Emergency Medicine | Admitting: Emergency Medicine

## 2023-04-03 DIAGNOSIS — E861 Hypovolemia: Secondary | ICD-10-CM | POA: Insufficient documentation

## 2023-04-03 DIAGNOSIS — R000 Tachycardia, unspecified: Secondary | ICD-10-CM | POA: Insufficient documentation

## 2023-04-03 DIAGNOSIS — R0902 Hypoxemia: Secondary | ICD-10-CM | POA: Diagnosis not present

## 2023-04-03 DIAGNOSIS — Z7901 Long term (current) use of anticoagulants: Secondary | ICD-10-CM | POA: Insufficient documentation

## 2023-04-03 DIAGNOSIS — I700 Atherosclerosis of aorta: Secondary | ICD-10-CM | POA: Insufficient documentation

## 2023-04-03 DIAGNOSIS — I4510 Unspecified right bundle-branch block: Secondary | ICD-10-CM | POA: Insufficient documentation

## 2023-04-03 DIAGNOSIS — Z992 Dependence on renal dialysis: Secondary | ICD-10-CM | POA: Diagnosis not present

## 2023-04-03 DIAGNOSIS — R944 Abnormal results of kidney function studies: Secondary | ICD-10-CM | POA: Diagnosis not present

## 2023-04-03 DIAGNOSIS — R680 Hypothermia, not associated with low environmental temperature: Secondary | ICD-10-CM | POA: Diagnosis not present

## 2023-04-03 DIAGNOSIS — Z9350 Unspecified cystostomy status: Secondary | ICD-10-CM | POA: Diagnosis not present

## 2023-04-03 DIAGNOSIS — Z859 Personal history of malignant neoplasm, unspecified: Secondary | ICD-10-CM | POA: Insufficient documentation

## 2023-04-03 DIAGNOSIS — I444 Left anterior fascicular block: Secondary | ICD-10-CM | POA: Diagnosis not present

## 2023-04-03 DIAGNOSIS — N186 End stage renal disease: Secondary | ICD-10-CM | POA: Diagnosis not present

## 2023-04-03 DIAGNOSIS — I4892 Unspecified atrial flutter: Secondary | ICD-10-CM | POA: Insufficient documentation

## 2023-04-03 DIAGNOSIS — R230 Cyanosis: Secondary | ICD-10-CM | POA: Diagnosis not present

## 2023-04-03 DIAGNOSIS — Z79899 Other long term (current) drug therapy: Secondary | ICD-10-CM | POA: Diagnosis not present

## 2023-04-03 DIAGNOSIS — E871 Hypo-osmolality and hyponatremia: Secondary | ICD-10-CM | POA: Diagnosis not present

## 2023-04-03 DIAGNOSIS — I959 Hypotension, unspecified: Secondary | ICD-10-CM | POA: Diagnosis not present

## 2023-04-03 DIAGNOSIS — I4891 Unspecified atrial fibrillation: Secondary | ICD-10-CM | POA: Insufficient documentation

## 2023-04-03 DIAGNOSIS — J42 Unspecified chronic bronchitis: Secondary | ICD-10-CM | POA: Diagnosis not present

## 2023-04-03 LAB — CBC WITH DIFFERENTIAL/PLATELET
Abs Immature Granulocytes: 0.05 10*3/uL (ref 0.00–0.07)
Basophils Absolute: 0.1 10*3/uL (ref 0.0–0.1)
Basophils Relative: 1 %
Eosinophils Absolute: 0.2 10*3/uL (ref 0.0–0.5)
Eosinophils Relative: 2 %
HCT: 36.8 % — ABNORMAL LOW (ref 39.0–52.0)
Hemoglobin: 12.1 g/dL — ABNORMAL LOW (ref 13.0–17.0)
Immature Granulocytes: 1 %
Lymphocytes Relative: 15 %
Lymphs Abs: 1.3 10*3/uL (ref 0.7–4.0)
MCH: 31.6 pg (ref 26.0–34.0)
MCHC: 32.9 g/dL (ref 30.0–36.0)
MCV: 96.1 fL (ref 80.0–100.0)
Monocytes Absolute: 0.7 10*3/uL (ref 0.1–1.0)
Monocytes Relative: 8 %
Neutro Abs: 6.6 10*3/uL (ref 1.7–7.7)
Neutrophils Relative %: 73 %
Platelets: 194 10*3/uL (ref 150–400)
RBC: 3.83 MIL/uL — ABNORMAL LOW (ref 4.22–5.81)
RDW: 15 % (ref 11.5–15.5)
WBC: 8.8 10*3/uL (ref 4.0–10.5)
nRBC: 0 % (ref 0.0–0.2)

## 2023-04-03 LAB — COMPREHENSIVE METABOLIC PANEL
ALT: 14 U/L (ref 0–44)
AST: 17 U/L (ref 15–41)
Albumin: 3.9 g/dL (ref 3.5–5.0)
Alkaline Phosphatase: 35 U/L — ABNORMAL LOW (ref 38–126)
Anion gap: 16 — ABNORMAL HIGH (ref 5–15)
BUN: 49 mg/dL — ABNORMAL HIGH (ref 8–23)
CO2: 25 mmol/L (ref 22–32)
Calcium: 9.2 mg/dL (ref 8.9–10.3)
Chloride: 92 mmol/L — ABNORMAL LOW (ref 98–111)
Creatinine, Ser: 7.58 mg/dL — ABNORMAL HIGH (ref 0.61–1.24)
GFR, Estimated: 7 mL/min — ABNORMAL LOW (ref >60)
Glucose, Bld: 100 mg/dL — ABNORMAL HIGH (ref 70–99)
Potassium: 4.5 mmol/L (ref 3.5–5.1)
Sodium: 133 mmol/L — ABNORMAL LOW (ref 135–145)
Total Bilirubin: 0.6 mg/dL (ref 0.0–1.2)
Total Protein: 7.8 g/dL (ref 6.5–8.1)

## 2023-04-03 LAB — APTT: aPTT: 50 s — ABNORMAL HIGH (ref 24–36)

## 2023-04-03 LAB — RESP PANEL BY RT-PCR (RSV, FLU A&B, COVID)  RVPGX2
Influenza A by PCR: NEGATIVE
Influenza B by PCR: NEGATIVE
Resp Syncytial Virus by PCR: NEGATIVE
SARS Coronavirus 2 by RT PCR: NEGATIVE

## 2023-04-03 LAB — PROTIME-INR
INR: 1.5 — ABNORMAL HIGH (ref 0.8–1.2)
Prothrombin Time: 17.8 s — ABNORMAL HIGH (ref 11.4–15.2)

## 2023-04-03 LAB — LACTIC ACID, PLASMA: Lactic Acid, Venous: 0.9 mmol/L (ref 0.5–1.9)

## 2023-04-03 MED ORDER — LACTATED RINGERS IV BOLUS (SEPSIS)
1000.0000 mL | Freq: Once | INTRAVENOUS | Status: AC
  Administered 2023-04-03: 1000 mL via INTRAVENOUS

## 2023-04-03 NOTE — ED Provider Notes (Signed)
 [place] EMERGENCY DEPARTMENT AT [facility] Provider Note   CSN: [id number] Arrival date & time: 04/03/23  1245     History  No chief complaint on file.   Glenn Olson is a 83 y.o. male.  HPI      Glenn Olson is a 83 y.o. 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  He was sent by EMS from nephrology's office for evaluation of hypoxia and hypotension.  Patient states that his blood pressure normally runs a little low.  States he woke up feeling weak and "sluggish."  Denies any pain, fever, chills, nausea or vomiting.  No diarrhea.  No chest pain or shortness of breath.  Denies any recent changes to his medications.  He last had a dialysis treatment yesterday and caregiver states that his blood pressure dropped slightly yesterday after the treatment was completed.   Has dialysis catheter right chest that was placed 05/2022 also has suprapubic catheter, pt states he still makes urine but his son at bedside states that he makes very little to no urine.   Home Medications Prior to Admission medications   Medication Sig Start Date End Date Taking? Authorizing Provider  amiodarone (PACERONE) 200 MG tablet Take 1 tablet (200 mg total) by mouth daily. 11/16/22   Fenton, Clint R, PA  apixaban (ELIQUIS) 2.5 MG TABS tablet TAKE 1 TABLET TWICE A DAY 01/28/23   Fenton, Clint R, PA  atorvastatin (LIPITOR) 10 MG tablet 10 mg daily. 02/06/23   [provider]  calcitRIOL (ROCALTROL) 0.5 MCG capsule Take 1 capsule (0.5 mcg total) by mouth daily. 06/28/22   Love, Evlyn Kanner, PA-C  loratadine (CLARITIN) 10 MG tablet Take 1 tablet (10 mg total) by mouth daily. 06/29/22   Love, Evlyn Kanner, PA-C  melatonin 3 MG TABS tablet Take 1 tablet (3 mg total) by mouth at bedtime as needed. Patient taking differently: Take 3  mg by mouth at bedtime as needed (Sleep). 06/28/22   Love, Evlyn Kanner, PA-C  Methoxy PEG-Epoetin Beta (MIRCERA IJ) Mircera 11/03/22 11/02/23  [provider]  metoprolol succinate (TOPROL XL) 25 MG 24 hr tablet Take 0.5 tablets (12.5 mg total) by mouth daily. 03/14/23   Wendall Stade, MD  oxyCODONE-acetaminophen (PERCOCET) 5-325 MG tablet Take 1 tablet by mouth every 6 (six) hours as needed for severe pain (pain score 7-10). 01/11/23   Rhyne, Ames Coupe, PA-C  VELPHORO 500 MG chewable tablet Chew 500 mg by mouth 3 (three) times daily. 10/30/22   [provider]  vitamin D3 (CHOLECALCIFEROL) 25 MCG tablet Take 1 tablet (1,000 Units total) by mouth daily. 06/29/22   Jacquelynn Cree, PA-C      Allergies    Patient has no known allergies.    Review of Systems   Review of Systems  Constitutional:  Positive for fatigue. Negative for appetite change and fever.  HENT:  Negative for congestion and sore throat.   Respiratory:  Negative for cough and shortness of breath.   Cardiovascular:  Negative for chest pain and leg swelling.  Gastrointestinal:  Negative for abdominal pain, diarrhea, nausea and vomiting.  Neurological:  Positive for weakness. Negative for dizziness, seizures, syncope, speech difficulty and light-headedness.    Physical Exam Updated Vital Signs BP 95/65   Pulse (!) 114   Temp 97.6 F (36.4 C) (Oral)  Resp (!) 23   Ht 6' (1.829 m)   Wt 80.7 kg   SpO2 99%   BMI 24.14 kg/m  Physical Exam Vitals and nursing note reviewed.  Constitutional:      General: He is not in acute distress.    Appearance: Normal appearance. He is not toxic-appearing.  HENT:     Mouth/Throat:     Mouth: Mucous membranes are moist.  Eyes:     Extraocular Movements: Extraocular movements intact.     Conjunctiva/sclera: Conjunctivae normal.     Pupils: Pupils are equal, round, and reactive to light.  Cardiovascular:     Rate and Rhythm: Regular rhythm. Tachycardia present.      Pulses: Normal pulses.  Pulmonary:     Effort: Pulmonary effort is normal. No respiratory distress.  Chest:     Chest wall: No tenderness.  Abdominal:     General: There is no distension.     Palpations: Abdomen is soft.     Tenderness: There is no abdominal tenderness.  Musculoskeletal:        General: Normal range of motion.     Cervical back: Normal range of motion.     Right lower leg: No edema.     Left lower leg: No edema.  Lymphadenopathy:     Cervical: No cervical adenopathy.  Skin:    General: Skin is warm.     Capillary Refill: Capillary refill takes less than 2 seconds.  Neurological:     General: No focal deficit present.     Mental Status: He is alert.     GCS: GCS eye subscore is 4. GCS verbal subscore is 5. GCS motor subscore is 6.     Sensory: Sensation is intact. No sensory deficit.     Motor: Motor function is intact. No weakness or pronator drift.     Comments: CN III through XII grossly intact.  Speech clear.  No facial droop, no pronator drift.  Patient answering questions appropriately.     ED Results / Procedures / Treatments   Labs (all labs ordered are listed, but only abnormal results are displayed) Labs Reviewed  COMPREHENSIVE METABOLIC PANEL - Abnormal; Notable for the following components:      Result Value   Sodium 133 (*)    Chloride 92 (*)    Glucose, Bld 100 (*)    BUN 49 (*)    Creatinine, Ser 7.58 (*)    Alkaline Phosphatase 35 (*)    GFR, Estimated 7 (*)    Anion gap 16 (*)    All other components within normal limits  CBC WITH DIFFERENTIAL/PLATELET - Abnormal; Notable for the following components:   RBC 3.83 (*)    Hemoglobin 12.1 (*)    HCT 36.8 (*)    All other components within normal limits  PROTIME-INR - Abnormal; Notable for the following components:   Prothrombin Time 17.8 (*)    INR 1.5 (*)    All other components within normal limits  APTT - Abnormal; Notable for the following components:   aPTT 50 (*)    All other  components within normal limits  CULTURE, BLOOD (ROUTINE X 2)  CULTURE, BLOOD (ROUTINE X 2)  RESP PANEL BY RT-PCR (RSV, FLU A&B, COVID)  RVPGX2  LACTIC ACID, PLASMA  URINALYSIS, W/ REFLEX TO CULTURE (INFECTION SUSPECTED)    EKG None  Radiology DG Chest Port 1 View Result Date: 04/03/2023 CLINICAL DATA:  Possible sepsis hypoxia and hypotension EXAM: PORTABLE CHEST 1 VIEW  COMPARISON:  01/25/2023, 02/06/2021, 09/27/2020 FINDINGS: Right-sided central venous catheter tip at the right atrium. Mild diffuse chronic bronchitic changes. No focal consolidation, pleural effusion or pneumothorax. Stable cardiomediastinal silhouette with aortic atherosclerosis IMPRESSION: No active disease.  Chronic appearing bronchitic changes Electronically Signed   By: Jasmine Pang M.D.   On: 04/03/2023 16:35    Procedures Procedures    CRITICAL CARE Performed by: Jozy Mcphearson Total critical care time: 35 minutes Critical care time was exclusive of separately billable procedures and treating other patients. Critical care was necessary to treat or prevent imminent or life-threatening deterioration. Critical care was time spent personally by me on the following activities: development of treatment plan with patient and/or surrogate as well as nursing, discussions with consultants, evaluation of patient's response to treatment, examination of patient, obtaining history from patient or surrogate, ordering and performing treatments and interventions, ordering and review of laboratory studies, ordering and review of radiographic studies, pulse oximetry and re-evaluation of patient's condition.   Medications Ordered in ED Medications  lactated ringers bolus 1,000 mL (has no administration in time range)    ED Course/ Medical Decision Making/ A&P                                 Medical Decision Making Patient here from nephrologist office for evaluation of hypotension and hypoxia.  Patient does hemodialysis at  home, has dialysis catheter right chest.  Takes treatments 4 times a week last treatment was yesterday.  Caregiver at bedside states that his pressure was slightly lower yesterday after his treatment was completed.  Patient reports that he woke feeling weak and sluggish this morning, but denies any pain nausea vomiting or shortness of breath no fever or chills reported.  Patient was noted to have systolic blood pressure in the 60s upon arrival tachycardic.  Was on 4 L O2 Afebrile  Differential would include but not limited to hypovolemia, sepsis, viral process, infection    Amount and/or Complexity of Data Reviewed Labs: ordered.    Details: Labs interpreted by me, no evidence of leukocytosis, chemistries mild hyponatremia, BUN slightly elevated, creatinine at baseline.  Lactic reassuring respiratory panel negative Radiology: ordered.    Details: Chest x-ray without active disease, chronic appearing bronchitic change ECG/medicine tests: ordered.    Details: EKG shows sinus tachycardia right bundle branch block left anterior fascicular block Discussion of management or test interpretation with external provider(s):  Patient was hypotensive on arrival, pressures responded nicely after IV fluids.  Pressures now greater than 100 systolic.  Was reported to be hypoxic which is also resolved.  Patient has tolerated oral fluids and snack.  Resting comfortably.  Requesting to be discharged home.  Workup without evidence for sepsis.  Likely hypovolemia  I have consulted nephrology, Dr. Signe Colt who agrees with assessment and felt that patient could be discharged home and follow-up with his nephrologist.           Final Clinical Impression(s) / ED Diagnoses Final diagnoses:  Hypovolemia    Rx / DC Orders ED Discharge Orders     None         Pauline Aus, PA-C 04/03/23 2253    Pricilla Loveless, MD 04/04/23 1030

## 2023-04-03 NOTE — Discharge Instructions (Signed)
 Call Dr.Coladonato's office to arrange follow-up appointment.  Return to the emergency department for any new or worsening symptoms

## 2023-04-03 NOTE — ED Triage Notes (Signed)
 Pt sent from [facility] center for hypoxia and hypotension. Pt blood pressure is 69/40 and oxygen on room air is 82%. Came up to 96 on 2 ltrs.

## 2023-04-03 NOTE — ED Notes (Signed)
 Pt made aware of need for urine specimen. He states "I don't make much urine, but if there's any it'll be in this tube." He revealed a chronic foley catheter that was plugged. Plug was taken out and 2 large drops of urine were produced. Amount insufficient for testing.

## 2023-04-04 DIAGNOSIS — N186 End stage renal disease: Secondary | ICD-10-CM | POA: Diagnosis not present

## 2023-04-04 DIAGNOSIS — Z992 Dependence on renal dialysis: Secondary | ICD-10-CM | POA: Diagnosis not present

## 2023-04-04 DIAGNOSIS — D509 Iron deficiency anemia, unspecified: Secondary | ICD-10-CM | POA: Diagnosis not present

## 2023-04-04 DIAGNOSIS — E8770 Fluid overload, unspecified: Secondary | ICD-10-CM | POA: Diagnosis not present

## 2023-04-04 DIAGNOSIS — D631 Anemia in chronic kidney disease: Secondary | ICD-10-CM | POA: Diagnosis not present

## 2023-04-05 DIAGNOSIS — E8770 Fluid overload, unspecified: Secondary | ICD-10-CM | POA: Diagnosis not present

## 2023-04-05 DIAGNOSIS — D631 Anemia in chronic kidney disease: Secondary | ICD-10-CM | POA: Diagnosis not present

## 2023-04-05 DIAGNOSIS — E039 Hypothyroidism, unspecified: Secondary | ICD-10-CM | POA: Diagnosis not present

## 2023-04-05 DIAGNOSIS — N186 End stage renal disease: Secondary | ICD-10-CM | POA: Diagnosis not present

## 2023-04-05 DIAGNOSIS — D509 Iron deficiency anemia, unspecified: Secondary | ICD-10-CM | POA: Diagnosis not present

## 2023-04-05 DIAGNOSIS — Z992 Dependence on renal dialysis: Secondary | ICD-10-CM | POA: Diagnosis not present

## 2023-04-08 LAB — CULTURE, BLOOD (ROUTINE X 2)
Culture: NO GROWTH
Culture: NO GROWTH
Special Requests: ADEQUATE

## 2023-04-09 DIAGNOSIS — Z992 Dependence on renal dialysis: Secondary | ICD-10-CM | POA: Diagnosis not present

## 2023-04-09 DIAGNOSIS — D509 Iron deficiency anemia, unspecified: Secondary | ICD-10-CM | POA: Diagnosis not present

## 2023-04-09 DIAGNOSIS — D631 Anemia in chronic kidney disease: Secondary | ICD-10-CM | POA: Diagnosis not present

## 2023-04-09 DIAGNOSIS — N186 End stage renal disease: Secondary | ICD-10-CM | POA: Diagnosis not present

## 2023-04-09 DIAGNOSIS — E8770 Fluid overload, unspecified: Secondary | ICD-10-CM | POA: Diagnosis not present

## 2023-04-10 ENCOUNTER — Ambulatory Visit: Payer: Medicare Other | Attending: Student | Admitting: Student

## 2023-04-10 ENCOUNTER — Encounter: Payer: Self-pay | Admitting: Student

## 2023-04-10 VITALS — BP 136/70 | HR 68 | Ht 72.0 in | Wt 178.4 lb

## 2023-04-10 DIAGNOSIS — D509 Iron deficiency anemia, unspecified: Secondary | ICD-10-CM | POA: Diagnosis not present

## 2023-04-10 DIAGNOSIS — Z7901 Long term (current) use of anticoagulants: Secondary | ICD-10-CM | POA: Diagnosis present

## 2023-04-10 DIAGNOSIS — I4819 Other persistent atrial fibrillation: Secondary | ICD-10-CM | POA: Insufficient documentation

## 2023-04-10 DIAGNOSIS — Z9289 Personal history of other medical treatment: Secondary | ICD-10-CM | POA: Insufficient documentation

## 2023-04-10 DIAGNOSIS — D631 Anemia in chronic kidney disease: Secondary | ICD-10-CM | POA: Diagnosis not present

## 2023-04-10 DIAGNOSIS — N186 End stage renal disease: Secondary | ICD-10-CM | POA: Diagnosis not present

## 2023-04-10 DIAGNOSIS — I483 Typical atrial flutter: Secondary | ICD-10-CM | POA: Diagnosis present

## 2023-04-10 DIAGNOSIS — Z992 Dependence on renal dialysis: Secondary | ICD-10-CM | POA: Diagnosis not present

## 2023-04-10 DIAGNOSIS — E8770 Fluid overload, unspecified: Secondary | ICD-10-CM | POA: Diagnosis not present

## 2023-04-10 NOTE — Patient Instructions (Signed)
 Medication Instructions:  Your physician recommends that you continue on your current medications as directed. Please refer to the Current Medication list given to you today.  *If you need a refill on your cardiac medications before your next appointment, please call your pharmacy*   Lab Work: NONE  If you have labs (blood work) drawn today and your tests are completely normal, you will receive your results only by: MyChart Message (if you have MyChart) OR A paper copy in the mail If you have any lab test that is abnormal or we need to change your treatment, we will call you to review the results.   Testing/Procedures: NONE    Follow-Up: At [facility], you and your health needs are our priority.  As part of our continuing mission to provide you with exceptional heart care, we have created designated Provider Care Teams.  These Care Teams include your primary Cardiologist (physician) and Advanced Practice Providers (APPs -  Physician Assistants and Nurse Practitioners) who all work together to provide you with the care you need, when you need it.  We recommend signing up for the patient portal called "MyChart".  Sign up information is provided on this After Visit Summary.  MyChart is used to connect with patients for Virtual Visits (Telemedicine).  Patients are able to view lab/test results, encounter notes, upcoming appointments, etc.  Non-urgent messages can be sent to your provider as well.   To learn more about what you can do with MyChart, go to [email or web address].    Your next appointment:    October / November  Provider:   Charlton Haws, MD    Other Instructions Thank you for choosing [facility]!

## 2023-04-10 NOTE — Progress Notes (Signed)
 Cardiology Office Note    Date:  04/10/2023  ID:  Davius, Goudeau 1940-06-16, MRN [id number] Cardiologist: Charlton Haws, MD    History of Present Illness:    Acelin Ferdig is a 83 y.o. male with past medical history of paroxysmal atrial fibrillation, ESRD and anemia who presents to the office today for 36-month follow-up.  He was examined by Dr. Eden Emms in 01/2023 and had recently been evaluated in the ED for tachycardia with EKG showing atrial flutter. Was in normal sinus rhythm at the time of his follow-up visit. He was continued on Eliquis and Amiodarone 200 mg daily along with being started on Cardizem CD 120 mg daily. A 30-day monitor was recommended for reassessment along with a Lexiscan Myoview. His stress test showed no evidence of infarct and was noted to have a small defect along the apical to mid inferior lateral location which was consistent with ischemia but overall mild and the study was low-risk. His monitor showed predominantly normal sinus rhythm with no significant arrhythmias.  The patient's son did contact the office in the interim reporting he was having hypotension since being started on Cardizem and Dr. Eden Emms did recommend stopping this and switching to Toprol-XL 12.5 mg daily with plans for follow-up afterwards. He also met with the [facility] on 03/25/2023 and denied any symptoms at that time. No changes were made to his medications. In the interim, he was evaluated in the ED earlier this month for hypoxia and hypotension. Received IV fluids with improvement in BP and symptoms and was discharged home.  In talking with the patient and his son today, he reports things have overall been stable since. No recurrent episodes of significant hypotension. Says his BP is "typically low" but nothing abnormal to him. Says he has overall been feeling better since being switched from Cardizem CD to Toprol-XL. He does undergo dialysis 4 days a week at home. Has been able  to achieve his dry weight and reports they rarely have to stop his sessions early. He is not overly active at baseline and the most active thing he does is ambulate around his home. He denies any recent chest pain or dyspnea on exertion with this. No specific orthopnea, PND or pitting edema. Remains on Eliquis for anticoagulation with no reports of active bleeding. He does have follow-up with his PCP later this month to review his thyroid labs.  Studies Reviewed:   EKG: EKG is not ordered today.   Echocardiogram: 05/2022 IMPRESSIONS     1. Left ventricular ejection fraction, by estimation, is 55 to 60%. The  left ventricle has normal function. The left ventricle has no regional  wall motion abnormalities. There is mild concentric left ventricular  hypertrophy. Left ventricular diastolic  function could not be evaluated.   2. Right ventricular systolic function is normal. The right ventricular  size is normal. There is mildly elevated pulmonary artery systolic  pressure. The estimated right ventricular systolic pressure is 44.2 mmHg.   3. Left atrial size was moderately dilated.   4. Right atrial size was mildly dilated.   5. The mitral valve is grossly normal. Trivial mitral valve  regurgitation. No evidence of mitral stenosis.   6. The aortic valve is tricuspid. Aortic valve regurgitation is not  visualized. No aortic stenosis is present.   7. The inferior vena cava is dilated in size with <50% respiratory  variability, suggesting right atrial pressure of 15 mmHg.   NST: 01/2023   Stress ECG  is negative for ischemia and arrhythmias.   LV perfusion is abnormal. There is evidence of ischemia. There is no evidence of infarction. Defect 1: There is a small defect with mild reduction in uptake present in the apical to mid inferolateral location(s) that is reversible. There is normal wall motion in the defect area. Consistent with ischemia.   Left ventricular function is normal. Nuclear  stress EF: 62%.   Findings are consistent with ischemia and no infarction. The study is low risk.  Event Monitor: 01/2023 NSR Average HR 62 bpm No significant arrhythmias   Charlton Haws MD [facility]   Risk Assessment/Calculations:    CHA2DS2-VASc Score = 2   This indicates a 2.2% annual risk of stroke. The patient's score is based upon: CHF History: 0 HTN History: 0 Diabetes History: 0 Stroke History: 0 Vascular Disease History: 0 Age Score: 2 Gender Score: 0    Physical Exam:   VS:  BP 136/70   Pulse 68   Ht 6' (1.829 m)   Wt 178 lb 6.4 oz (80.9 kg)   SpO2 92%   BMI 24.20 kg/m    Wt Readings from Last 3 Encounters:  04/10/23 178 lb 6.4 oz (80.9 kg)  04/03/23 178 lb (80.7 kg)  03/25/23 178 lb 9.6 oz (81 kg)     GEN: Pleasant, elderly male appearing in no acute distress NECK: No JVD; No carotid bruits CARDIAC: RRR, no murmurs, rubs, gallops RESPIRATORY:  Clear to auscultation without rales, wheezing or rhonchi  ABDOMEN: Appears non-distended. No obvious abdominal masses. EXTREMITIES: No clubbing or cyanosis. No pitting edema.  Distal pedal pulses are 2+ bilaterally.   Assessment and Plan:   1. Paroxysmal Atrial Fibrillation/Flutter - He is overall asymptomatic with his episodes of atrial fibrillation/flutter. Continue Toprol-XL 12.5 mg daily and we reviewed that if hypotension becomes a recurrent issue during dialysis, could take this after his dialysis sessions or hold the days of dialysis. - He has remained on Amiodarone 200 mg daily and TSH was elevated at 18.381 in 03/2023. The [facility] recommended follow-up with his PCP for evaluation and likely thyroid hormone supplementation as he would not be a candidate for other antiarrhythmic options given his ESRD. He does report having follow-up with his PCP scheduled for later this month to review. I did previously review with the [facility] earlier today and they recommended continuing  his current dose of Amiodarone for now given his recurrent episodes of tachycardia over the past few months.  2. Use of Long-term Anticoagulation - No reports of active bleeding. Continue Eliquis 2.5 mg twice daily for anticoagulation which is the appropriate dose at this time given his age and renal function.  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  4. ESRD - Followed by Dr. Arrie Aran. On HD at home 4 days per week.   Signed, Ellsworth Lennox, PA-C

## 2023-04-11 DIAGNOSIS — N186 End stage renal disease: Secondary | ICD-10-CM | POA: Diagnosis not present

## 2023-04-11 DIAGNOSIS — Z992 Dependence on renal dialysis: Secondary | ICD-10-CM | POA: Diagnosis not present

## 2023-04-11 DIAGNOSIS — D509 Iron deficiency anemia, unspecified: Secondary | ICD-10-CM | POA: Diagnosis not present

## 2023-04-11 DIAGNOSIS — D631 Anemia in chronic kidney disease: Secondary | ICD-10-CM | POA: Diagnosis not present

## 2023-04-11 DIAGNOSIS — E8770 Fluid overload, unspecified: Secondary | ICD-10-CM | POA: Diagnosis not present

## 2023-04-13 DIAGNOSIS — N186 End stage renal disease: Secondary | ICD-10-CM | POA: Diagnosis not present

## 2023-04-13 DIAGNOSIS — D631 Anemia in chronic kidney disease: Secondary | ICD-10-CM | POA: Diagnosis not present

## 2023-04-13 DIAGNOSIS — Z992 Dependence on renal dialysis: Secondary | ICD-10-CM | POA: Diagnosis not present

## 2023-04-13 DIAGNOSIS — E8770 Fluid overload, unspecified: Secondary | ICD-10-CM | POA: Diagnosis not present

## 2023-04-13 DIAGNOSIS — D509 Iron deficiency anemia, unspecified: Secondary | ICD-10-CM | POA: Diagnosis not present

## 2023-04-16 DIAGNOSIS — D631 Anemia in chronic kidney disease: Secondary | ICD-10-CM | POA: Diagnosis not present

## 2023-04-16 DIAGNOSIS — Z992 Dependence on renal dialysis: Secondary | ICD-10-CM | POA: Diagnosis not present

## 2023-04-16 DIAGNOSIS — D509 Iron deficiency anemia, unspecified: Secondary | ICD-10-CM | POA: Diagnosis not present

## 2023-04-16 DIAGNOSIS — N186 End stage renal disease: Secondary | ICD-10-CM | POA: Diagnosis not present

## 2023-04-16 DIAGNOSIS — E8770 Fluid overload, unspecified: Secondary | ICD-10-CM | POA: Diagnosis not present

## 2023-04-17 DIAGNOSIS — Z992 Dependence on renal dialysis: Secondary | ICD-10-CM | POA: Diagnosis not present

## 2023-04-17 DIAGNOSIS — D509 Iron deficiency anemia, unspecified: Secondary | ICD-10-CM | POA: Diagnosis not present

## 2023-04-17 DIAGNOSIS — D631 Anemia in chronic kidney disease: Secondary | ICD-10-CM | POA: Diagnosis not present

## 2023-04-17 DIAGNOSIS — N186 End stage renal disease: Secondary | ICD-10-CM | POA: Diagnosis not present

## 2023-04-17 DIAGNOSIS — E8770 Fluid overload, unspecified: Secondary | ICD-10-CM | POA: Diagnosis not present

## 2023-04-18 DIAGNOSIS — E8770 Fluid overload, unspecified: Secondary | ICD-10-CM | POA: Diagnosis not present

## 2023-04-18 DIAGNOSIS — Z992 Dependence on renal dialysis: Secondary | ICD-10-CM | POA: Diagnosis not present

## 2023-04-18 DIAGNOSIS — D631 Anemia in chronic kidney disease: Secondary | ICD-10-CM | POA: Diagnosis not present

## 2023-04-18 DIAGNOSIS — D509 Iron deficiency anemia, unspecified: Secondary | ICD-10-CM | POA: Diagnosis not present

## 2023-04-18 DIAGNOSIS — N186 End stage renal disease: Secondary | ICD-10-CM | POA: Diagnosis not present

## 2023-04-19 DIAGNOSIS — N186 End stage renal disease: Secondary | ICD-10-CM | POA: Diagnosis not present

## 2023-04-19 DIAGNOSIS — E8770 Fluid overload, unspecified: Secondary | ICD-10-CM | POA: Diagnosis not present

## 2023-04-19 DIAGNOSIS — D509 Iron deficiency anemia, unspecified: Secondary | ICD-10-CM | POA: Diagnosis not present

## 2023-04-19 DIAGNOSIS — D631 Anemia in chronic kidney disease: Secondary | ICD-10-CM | POA: Diagnosis not present

## 2023-04-19 DIAGNOSIS — Z992 Dependence on renal dialysis: Secondary | ICD-10-CM | POA: Diagnosis not present

## 2023-04-22 DIAGNOSIS — D631 Anemia in chronic kidney disease: Secondary | ICD-10-CM | POA: Diagnosis not present

## 2023-04-22 DIAGNOSIS — N186 End stage renal disease: Secondary | ICD-10-CM | POA: Diagnosis not present

## 2023-04-22 DIAGNOSIS — Z992 Dependence on renal dialysis: Secondary | ICD-10-CM | POA: Diagnosis not present

## 2023-04-22 DIAGNOSIS — E8770 Fluid overload, unspecified: Secondary | ICD-10-CM | POA: Diagnosis not present

## 2023-04-22 DIAGNOSIS — D509 Iron deficiency anemia, unspecified: Secondary | ICD-10-CM | POA: Diagnosis not present

## 2023-04-23 DIAGNOSIS — E8770 Fluid overload, unspecified: Secondary | ICD-10-CM | POA: Diagnosis not present

## 2023-04-23 DIAGNOSIS — D631 Anemia in chronic kidney disease: Secondary | ICD-10-CM | POA: Diagnosis not present

## 2023-04-23 DIAGNOSIS — Z992 Dependence on renal dialysis: Secondary | ICD-10-CM | POA: Diagnosis not present

## 2023-04-23 DIAGNOSIS — N186 End stage renal disease: Secondary | ICD-10-CM | POA: Diagnosis not present

## 2023-04-23 DIAGNOSIS — D509 Iron deficiency anemia, unspecified: Secondary | ICD-10-CM | POA: Diagnosis not present

## 2023-04-24 DIAGNOSIS — N186 End stage renal disease: Secondary | ICD-10-CM | POA: Diagnosis not present

## 2023-04-24 DIAGNOSIS — D509 Iron deficiency anemia, unspecified: Secondary | ICD-10-CM | POA: Diagnosis not present

## 2023-04-24 DIAGNOSIS — D631 Anemia in chronic kidney disease: Secondary | ICD-10-CM | POA: Diagnosis not present

## 2023-04-24 DIAGNOSIS — Z992 Dependence on renal dialysis: Secondary | ICD-10-CM | POA: Diagnosis not present

## 2023-04-24 DIAGNOSIS — E8770 Fluid overload, unspecified: Secondary | ICD-10-CM | POA: Diagnosis not present

## 2023-04-25 DIAGNOSIS — N186 End stage renal disease: Secondary | ICD-10-CM | POA: Diagnosis not present

## 2023-04-25 DIAGNOSIS — D631 Anemia in chronic kidney disease: Secondary | ICD-10-CM | POA: Diagnosis not present

## 2023-04-25 DIAGNOSIS — D509 Iron deficiency anemia, unspecified: Secondary | ICD-10-CM | POA: Diagnosis not present

## 2023-04-25 DIAGNOSIS — Z992 Dependence on renal dialysis: Secondary | ICD-10-CM | POA: Diagnosis not present

## 2023-04-25 DIAGNOSIS — E8770 Fluid overload, unspecified: Secondary | ICD-10-CM | POA: Diagnosis not present

## 2023-04-28 DIAGNOSIS — D631 Anemia in chronic kidney disease: Secondary | ICD-10-CM | POA: Diagnosis not present

## 2023-04-28 DIAGNOSIS — N186 End stage renal disease: Secondary | ICD-10-CM | POA: Diagnosis not present

## 2023-04-28 DIAGNOSIS — E8770 Fluid overload, unspecified: Secondary | ICD-10-CM | POA: Diagnosis not present

## 2023-04-28 DIAGNOSIS — Z992 Dependence on renal dialysis: Secondary | ICD-10-CM | POA: Diagnosis not present

## 2023-04-28 DIAGNOSIS — D509 Iron deficiency anemia, unspecified: Secondary | ICD-10-CM | POA: Diagnosis not present

## 2023-04-29 ENCOUNTER — Other Ambulatory Visit: Payer: Medicare Other | Admitting: Urology

## 2023-04-29 DIAGNOSIS — E8770 Fluid overload, unspecified: Secondary | ICD-10-CM | POA: Diagnosis not present

## 2023-04-29 DIAGNOSIS — D509 Iron deficiency anemia, unspecified: Secondary | ICD-10-CM | POA: Diagnosis not present

## 2023-04-29 DIAGNOSIS — N186 End stage renal disease: Secondary | ICD-10-CM | POA: Diagnosis not present

## 2023-04-29 DIAGNOSIS — Z992 Dependence on renal dialysis: Secondary | ICD-10-CM | POA: Diagnosis not present

## 2023-04-29 DIAGNOSIS — D631 Anemia in chronic kidney disease: Secondary | ICD-10-CM | POA: Diagnosis not present

## 2023-04-30 DIAGNOSIS — I129 Hypertensive chronic kidney disease with stage 1 through stage 4 chronic kidney disease, or unspecified chronic kidney disease: Secondary | ICD-10-CM | POA: Diagnosis not present

## 2023-04-30 DIAGNOSIS — E8770 Fluid overload, unspecified: Secondary | ICD-10-CM | POA: Diagnosis not present

## 2023-04-30 DIAGNOSIS — Z992 Dependence on renal dialysis: Secondary | ICD-10-CM | POA: Diagnosis not present

## 2023-04-30 DIAGNOSIS — N2581 Secondary hyperparathyroidism of renal origin: Secondary | ICD-10-CM | POA: Diagnosis not present

## 2023-04-30 DIAGNOSIS — N186 End stage renal disease: Secondary | ICD-10-CM | POA: Diagnosis not present

## 2023-04-30 DIAGNOSIS — D631 Anemia in chronic kidney disease: Secondary | ICD-10-CM | POA: Diagnosis not present

## 2023-04-30 DIAGNOSIS — E039 Hypothyroidism, unspecified: Secondary | ICD-10-CM | POA: Diagnosis not present

## 2023-04-30 DIAGNOSIS — D509 Iron deficiency anemia, unspecified: Secondary | ICD-10-CM | POA: Diagnosis not present

## 2023-05-02 DIAGNOSIS — Z992 Dependence on renal dialysis: Secondary | ICD-10-CM | POA: Diagnosis not present

## 2023-05-02 DIAGNOSIS — D631 Anemia in chronic kidney disease: Secondary | ICD-10-CM | POA: Diagnosis not present

## 2023-05-02 DIAGNOSIS — E8770 Fluid overload, unspecified: Secondary | ICD-10-CM | POA: Diagnosis not present

## 2023-05-02 DIAGNOSIS — N186 End stage renal disease: Secondary | ICD-10-CM | POA: Diagnosis not present

## 2023-05-02 DIAGNOSIS — D509 Iron deficiency anemia, unspecified: Secondary | ICD-10-CM | POA: Diagnosis not present

## 2023-05-06 DIAGNOSIS — Z992 Dependence on renal dialysis: Secondary | ICD-10-CM | POA: Diagnosis not present

## 2023-05-06 DIAGNOSIS — D631 Anemia in chronic kidney disease: Secondary | ICD-10-CM | POA: Diagnosis not present

## 2023-05-06 DIAGNOSIS — D509 Iron deficiency anemia, unspecified: Secondary | ICD-10-CM | POA: Diagnosis not present

## 2023-05-06 DIAGNOSIS — N186 End stage renal disease: Secondary | ICD-10-CM | POA: Diagnosis not present

## 2023-05-06 DIAGNOSIS — E8770 Fluid overload, unspecified: Secondary | ICD-10-CM | POA: Diagnosis not present

## 2023-05-09 DIAGNOSIS — E8770 Fluid overload, unspecified: Secondary | ICD-10-CM | POA: Diagnosis not present

## 2023-05-09 DIAGNOSIS — N186 End stage renal disease: Secondary | ICD-10-CM | POA: Diagnosis not present

## 2023-05-09 DIAGNOSIS — Z992 Dependence on renal dialysis: Secondary | ICD-10-CM | POA: Diagnosis not present

## 2023-05-09 DIAGNOSIS — D631 Anemia in chronic kidney disease: Secondary | ICD-10-CM | POA: Diagnosis not present

## 2023-05-09 DIAGNOSIS — D509 Iron deficiency anemia, unspecified: Secondary | ICD-10-CM | POA: Diagnosis not present

## 2023-05-10 DIAGNOSIS — D631 Anemia in chronic kidney disease: Secondary | ICD-10-CM | POA: Diagnosis not present

## 2023-05-10 DIAGNOSIS — E8770 Fluid overload, unspecified: Secondary | ICD-10-CM | POA: Diagnosis not present

## 2023-05-10 DIAGNOSIS — Z992 Dependence on renal dialysis: Secondary | ICD-10-CM | POA: Diagnosis not present

## 2023-05-10 DIAGNOSIS — D509 Iron deficiency anemia, unspecified: Secondary | ICD-10-CM | POA: Diagnosis not present

## 2023-05-10 DIAGNOSIS — N186 End stage renal disease: Secondary | ICD-10-CM | POA: Diagnosis not present

## 2023-05-13 DIAGNOSIS — D631 Anemia in chronic kidney disease: Secondary | ICD-10-CM | POA: Diagnosis not present

## 2023-05-13 DIAGNOSIS — D509 Iron deficiency anemia, unspecified: Secondary | ICD-10-CM | POA: Diagnosis not present

## 2023-05-13 DIAGNOSIS — N186 End stage renal disease: Secondary | ICD-10-CM | POA: Diagnosis not present

## 2023-05-13 DIAGNOSIS — E8770 Fluid overload, unspecified: Secondary | ICD-10-CM | POA: Diagnosis not present

## 2023-05-13 DIAGNOSIS — Z992 Dependence on renal dialysis: Secondary | ICD-10-CM | POA: Diagnosis not present

## 2023-05-14 DIAGNOSIS — N186 End stage renal disease: Secondary | ICD-10-CM | POA: Diagnosis not present

## 2023-05-14 DIAGNOSIS — Z992 Dependence on renal dialysis: Secondary | ICD-10-CM | POA: Diagnosis not present

## 2023-05-14 DIAGNOSIS — D509 Iron deficiency anemia, unspecified: Secondary | ICD-10-CM | POA: Diagnosis not present

## 2023-05-14 DIAGNOSIS — E8770 Fluid overload, unspecified: Secondary | ICD-10-CM | POA: Diagnosis not present

## 2023-05-14 DIAGNOSIS — D631 Anemia in chronic kidney disease: Secondary | ICD-10-CM | POA: Diagnosis not present

## 2023-05-16 DIAGNOSIS — E8770 Fluid overload, unspecified: Secondary | ICD-10-CM | POA: Diagnosis not present

## 2023-05-16 DIAGNOSIS — N186 End stage renal disease: Secondary | ICD-10-CM | POA: Diagnosis not present

## 2023-05-16 DIAGNOSIS — Z992 Dependence on renal dialysis: Secondary | ICD-10-CM | POA: Diagnosis not present

## 2023-05-16 DIAGNOSIS — D631 Anemia in chronic kidney disease: Secondary | ICD-10-CM | POA: Diagnosis not present

## 2023-05-16 DIAGNOSIS — D509 Iron deficiency anemia, unspecified: Secondary | ICD-10-CM | POA: Diagnosis not present

## 2023-05-18 DIAGNOSIS — E8770 Fluid overload, unspecified: Secondary | ICD-10-CM | POA: Diagnosis not present

## 2023-05-18 DIAGNOSIS — D631 Anemia in chronic kidney disease: Secondary | ICD-10-CM | POA: Diagnosis not present

## 2023-05-18 DIAGNOSIS — D509 Iron deficiency anemia, unspecified: Secondary | ICD-10-CM | POA: Diagnosis not present

## 2023-05-18 DIAGNOSIS — Z992 Dependence on renal dialysis: Secondary | ICD-10-CM | POA: Diagnosis not present

## 2023-05-18 DIAGNOSIS — N186 End stage renal disease: Secondary | ICD-10-CM | POA: Diagnosis not present

## 2023-05-21 DIAGNOSIS — E8770 Fluid overload, unspecified: Secondary | ICD-10-CM | POA: Diagnosis not present

## 2023-05-21 DIAGNOSIS — D509 Iron deficiency anemia, unspecified: Secondary | ICD-10-CM | POA: Diagnosis not present

## 2023-05-21 DIAGNOSIS — D631 Anemia in chronic kidney disease: Secondary | ICD-10-CM | POA: Diagnosis not present

## 2023-05-21 DIAGNOSIS — N186 End stage renal disease: Secondary | ICD-10-CM | POA: Diagnosis not present

## 2023-05-21 DIAGNOSIS — Z992 Dependence on renal dialysis: Secondary | ICD-10-CM | POA: Diagnosis not present

## 2023-05-22 DIAGNOSIS — D509 Iron deficiency anemia, unspecified: Secondary | ICD-10-CM | POA: Diagnosis not present

## 2023-05-22 DIAGNOSIS — E8770 Fluid overload, unspecified: Secondary | ICD-10-CM | POA: Diagnosis not present

## 2023-05-22 DIAGNOSIS — N186 End stage renal disease: Secondary | ICD-10-CM | POA: Diagnosis not present

## 2023-05-22 DIAGNOSIS — Z992 Dependence on renal dialysis: Secondary | ICD-10-CM | POA: Diagnosis not present

## 2023-05-22 DIAGNOSIS — D631 Anemia in chronic kidney disease: Secondary | ICD-10-CM | POA: Diagnosis not present

## 2023-05-24 DIAGNOSIS — Z992 Dependence on renal dialysis: Secondary | ICD-10-CM | POA: Diagnosis not present

## 2023-05-24 DIAGNOSIS — E8770 Fluid overload, unspecified: Secondary | ICD-10-CM | POA: Diagnosis not present

## 2023-05-24 DIAGNOSIS — N186 End stage renal disease: Secondary | ICD-10-CM | POA: Diagnosis not present

## 2023-05-24 DIAGNOSIS — D509 Iron deficiency anemia, unspecified: Secondary | ICD-10-CM | POA: Diagnosis not present

## 2023-05-24 DIAGNOSIS — D631 Anemia in chronic kidney disease: Secondary | ICD-10-CM | POA: Diagnosis not present

## 2023-05-25 DIAGNOSIS — Z992 Dependence on renal dialysis: Secondary | ICD-10-CM | POA: Diagnosis not present

## 2023-05-25 DIAGNOSIS — D509 Iron deficiency anemia, unspecified: Secondary | ICD-10-CM | POA: Diagnosis not present

## 2023-05-25 DIAGNOSIS — D631 Anemia in chronic kidney disease: Secondary | ICD-10-CM | POA: Diagnosis not present

## 2023-05-25 DIAGNOSIS — E8770 Fluid overload, unspecified: Secondary | ICD-10-CM | POA: Diagnosis not present

## 2023-05-25 DIAGNOSIS — N186 End stage renal disease: Secondary | ICD-10-CM | POA: Diagnosis not present

## 2023-05-28 DIAGNOSIS — D509 Iron deficiency anemia, unspecified: Secondary | ICD-10-CM | POA: Diagnosis not present

## 2023-05-28 DIAGNOSIS — D631 Anemia in chronic kidney disease: Secondary | ICD-10-CM | POA: Diagnosis not present

## 2023-05-28 DIAGNOSIS — N186 End stage renal disease: Secondary | ICD-10-CM | POA: Diagnosis not present

## 2023-05-28 DIAGNOSIS — E8770 Fluid overload, unspecified: Secondary | ICD-10-CM | POA: Diagnosis not present

## 2023-05-28 DIAGNOSIS — Z992 Dependence on renal dialysis: Secondary | ICD-10-CM | POA: Diagnosis not present

## 2023-05-29 DIAGNOSIS — D631 Anemia in chronic kidney disease: Secondary | ICD-10-CM | POA: Diagnosis not present

## 2023-05-29 DIAGNOSIS — D509 Iron deficiency anemia, unspecified: Secondary | ICD-10-CM | POA: Diagnosis not present

## 2023-05-29 DIAGNOSIS — N186 End stage renal disease: Secondary | ICD-10-CM | POA: Diagnosis not present

## 2023-05-29 DIAGNOSIS — E8770 Fluid overload, unspecified: Secondary | ICD-10-CM | POA: Diagnosis not present

## 2023-05-29 DIAGNOSIS — Z992 Dependence on renal dialysis: Secondary | ICD-10-CM | POA: Diagnosis not present

## 2023-05-30 DIAGNOSIS — E8770 Fluid overload, unspecified: Secondary | ICD-10-CM | POA: Diagnosis not present

## 2023-05-30 DIAGNOSIS — N186 End stage renal disease: Secondary | ICD-10-CM | POA: Diagnosis not present

## 2023-05-30 DIAGNOSIS — N2581 Secondary hyperparathyroidism of renal origin: Secondary | ICD-10-CM | POA: Diagnosis not present

## 2023-05-30 DIAGNOSIS — D631 Anemia in chronic kidney disease: Secondary | ICD-10-CM | POA: Diagnosis not present

## 2023-05-30 DIAGNOSIS — I129 Hypertensive chronic kidney disease with stage 1 through stage 4 chronic kidney disease, or unspecified chronic kidney disease: Secondary | ICD-10-CM | POA: Diagnosis not present

## 2023-05-30 DIAGNOSIS — Z992 Dependence on renal dialysis: Secondary | ICD-10-CM | POA: Diagnosis not present

## 2023-05-30 DIAGNOSIS — D509 Iron deficiency anemia, unspecified: Secondary | ICD-10-CM | POA: Diagnosis not present

## 2023-06-03 DIAGNOSIS — D509 Iron deficiency anemia, unspecified: Secondary | ICD-10-CM | POA: Diagnosis not present

## 2023-06-03 DIAGNOSIS — E8770 Fluid overload, unspecified: Secondary | ICD-10-CM | POA: Diagnosis not present

## 2023-06-03 DIAGNOSIS — N186 End stage renal disease: Secondary | ICD-10-CM | POA: Diagnosis not present

## 2023-06-03 DIAGNOSIS — Z992 Dependence on renal dialysis: Secondary | ICD-10-CM | POA: Diagnosis not present

## 2023-06-03 DIAGNOSIS — D631 Anemia in chronic kidney disease: Secondary | ICD-10-CM | POA: Diagnosis not present

## 2023-06-04 DIAGNOSIS — N186 End stage renal disease: Secondary | ICD-10-CM | POA: Diagnosis not present

## 2023-06-04 DIAGNOSIS — D631 Anemia in chronic kidney disease: Secondary | ICD-10-CM | POA: Diagnosis not present

## 2023-06-04 DIAGNOSIS — E8770 Fluid overload, unspecified: Secondary | ICD-10-CM | POA: Diagnosis not present

## 2023-06-04 DIAGNOSIS — Z992 Dependence on renal dialysis: Secondary | ICD-10-CM | POA: Diagnosis not present

## 2023-06-04 DIAGNOSIS — D509 Iron deficiency anemia, unspecified: Secondary | ICD-10-CM | POA: Diagnosis not present

## 2023-06-05 DIAGNOSIS — D509 Iron deficiency anemia, unspecified: Secondary | ICD-10-CM | POA: Diagnosis not present

## 2023-06-05 DIAGNOSIS — Z992 Dependence on renal dialysis: Secondary | ICD-10-CM | POA: Diagnosis not present

## 2023-06-05 DIAGNOSIS — D631 Anemia in chronic kidney disease: Secondary | ICD-10-CM | POA: Diagnosis not present

## 2023-06-05 DIAGNOSIS — E8770 Fluid overload, unspecified: Secondary | ICD-10-CM | POA: Diagnosis not present

## 2023-06-05 DIAGNOSIS — N186 End stage renal disease: Secondary | ICD-10-CM | POA: Diagnosis not present

## 2023-06-06 DIAGNOSIS — E039 Hypothyroidism, unspecified: Secondary | ICD-10-CM | POA: Diagnosis not present

## 2023-06-06 DIAGNOSIS — N186 End stage renal disease: Secondary | ICD-10-CM | POA: Diagnosis not present

## 2023-06-06 DIAGNOSIS — Z992 Dependence on renal dialysis: Secondary | ICD-10-CM | POA: Diagnosis not present

## 2023-06-06 DIAGNOSIS — D631 Anemia in chronic kidney disease: Secondary | ICD-10-CM | POA: Diagnosis not present

## 2023-06-06 DIAGNOSIS — D509 Iron deficiency anemia, unspecified: Secondary | ICD-10-CM | POA: Diagnosis not present

## 2023-06-06 DIAGNOSIS — E8770 Fluid overload, unspecified: Secondary | ICD-10-CM | POA: Diagnosis not present

## 2023-06-07 DIAGNOSIS — E8770 Fluid overload, unspecified: Secondary | ICD-10-CM | POA: Diagnosis not present

## 2023-06-07 DIAGNOSIS — N186 End stage renal disease: Secondary | ICD-10-CM | POA: Diagnosis not present

## 2023-06-07 DIAGNOSIS — D631 Anemia in chronic kidney disease: Secondary | ICD-10-CM | POA: Diagnosis not present

## 2023-06-07 DIAGNOSIS — Z992 Dependence on renal dialysis: Secondary | ICD-10-CM | POA: Diagnosis not present

## 2023-06-07 DIAGNOSIS — D509 Iron deficiency anemia, unspecified: Secondary | ICD-10-CM | POA: Diagnosis not present

## 2023-06-08 DIAGNOSIS — E8770 Fluid overload, unspecified: Secondary | ICD-10-CM | POA: Diagnosis not present

## 2023-06-08 DIAGNOSIS — D631 Anemia in chronic kidney disease: Secondary | ICD-10-CM | POA: Diagnosis not present

## 2023-06-08 DIAGNOSIS — Z992 Dependence on renal dialysis: Secondary | ICD-10-CM | POA: Diagnosis not present

## 2023-06-08 DIAGNOSIS — N186 End stage renal disease: Secondary | ICD-10-CM | POA: Diagnosis not present

## 2023-06-08 DIAGNOSIS — D509 Iron deficiency anemia, unspecified: Secondary | ICD-10-CM | POA: Diagnosis not present

## 2023-06-10 DIAGNOSIS — D509 Iron deficiency anemia, unspecified: Secondary | ICD-10-CM | POA: Diagnosis not present

## 2023-06-10 DIAGNOSIS — D631 Anemia in chronic kidney disease: Secondary | ICD-10-CM | POA: Diagnosis not present

## 2023-06-10 DIAGNOSIS — N186 End stage renal disease: Secondary | ICD-10-CM | POA: Diagnosis not present

## 2023-06-10 DIAGNOSIS — Z992 Dependence on renal dialysis: Secondary | ICD-10-CM | POA: Diagnosis not present

## 2023-06-10 DIAGNOSIS — E8770 Fluid overload, unspecified: Secondary | ICD-10-CM | POA: Diagnosis not present

## 2023-06-11 DIAGNOSIS — N186 End stage renal disease: Secondary | ICD-10-CM | POA: Diagnosis not present

## 2023-06-11 DIAGNOSIS — Z992 Dependence on renal dialysis: Secondary | ICD-10-CM | POA: Diagnosis not present

## 2023-06-11 DIAGNOSIS — D509 Iron deficiency anemia, unspecified: Secondary | ICD-10-CM | POA: Diagnosis not present

## 2023-06-11 DIAGNOSIS — D631 Anemia in chronic kidney disease: Secondary | ICD-10-CM | POA: Diagnosis not present

## 2023-06-11 DIAGNOSIS — E8770 Fluid overload, unspecified: Secondary | ICD-10-CM | POA: Diagnosis not present

## 2023-06-13 DIAGNOSIS — D509 Iron deficiency anemia, unspecified: Secondary | ICD-10-CM | POA: Diagnosis not present

## 2023-06-13 DIAGNOSIS — D631 Anemia in chronic kidney disease: Secondary | ICD-10-CM | POA: Diagnosis not present

## 2023-06-13 DIAGNOSIS — E8770 Fluid overload, unspecified: Secondary | ICD-10-CM | POA: Diagnosis not present

## 2023-06-13 DIAGNOSIS — N186 End stage renal disease: Secondary | ICD-10-CM | POA: Diagnosis not present

## 2023-06-13 DIAGNOSIS — Z992 Dependence on renal dialysis: Secondary | ICD-10-CM | POA: Diagnosis not present

## 2023-06-14 DIAGNOSIS — N186 End stage renal disease: Secondary | ICD-10-CM | POA: Diagnosis not present

## 2023-06-14 DIAGNOSIS — D509 Iron deficiency anemia, unspecified: Secondary | ICD-10-CM | POA: Diagnosis not present

## 2023-06-14 DIAGNOSIS — E8770 Fluid overload, unspecified: Secondary | ICD-10-CM | POA: Diagnosis not present

## 2023-06-14 DIAGNOSIS — Z992 Dependence on renal dialysis: Secondary | ICD-10-CM | POA: Diagnosis not present

## 2023-06-14 DIAGNOSIS — D631 Anemia in chronic kidney disease: Secondary | ICD-10-CM | POA: Diagnosis not present

## 2023-06-15 DIAGNOSIS — E8770 Fluid overload, unspecified: Secondary | ICD-10-CM | POA: Diagnosis not present

## 2023-06-15 DIAGNOSIS — N186 End stage renal disease: Secondary | ICD-10-CM | POA: Diagnosis not present

## 2023-06-15 DIAGNOSIS — D509 Iron deficiency anemia, unspecified: Secondary | ICD-10-CM | POA: Diagnosis not present

## 2023-06-15 DIAGNOSIS — Z992 Dependence on renal dialysis: Secondary | ICD-10-CM | POA: Diagnosis not present

## 2023-06-15 DIAGNOSIS — D631 Anemia in chronic kidney disease: Secondary | ICD-10-CM | POA: Diagnosis not present

## 2023-06-17 DIAGNOSIS — Z992 Dependence on renal dialysis: Secondary | ICD-10-CM | POA: Diagnosis not present

## 2023-06-17 DIAGNOSIS — N186 End stage renal disease: Secondary | ICD-10-CM | POA: Diagnosis not present

## 2023-06-17 DIAGNOSIS — E8770 Fluid overload, unspecified: Secondary | ICD-10-CM | POA: Diagnosis not present

## 2023-06-17 DIAGNOSIS — D509 Iron deficiency anemia, unspecified: Secondary | ICD-10-CM | POA: Diagnosis not present

## 2023-06-17 DIAGNOSIS — D631 Anemia in chronic kidney disease: Secondary | ICD-10-CM | POA: Diagnosis not present

## 2023-06-18 DIAGNOSIS — D638 Anemia in other chronic diseases classified elsewhere: Secondary | ICD-10-CM | POA: Diagnosis not present

## 2023-06-18 DIAGNOSIS — D631 Anemia in chronic kidney disease: Secondary | ICD-10-CM | POA: Diagnosis not present

## 2023-06-18 DIAGNOSIS — E782 Mixed hyperlipidemia: Secondary | ICD-10-CM | POA: Diagnosis not present

## 2023-06-18 DIAGNOSIS — E8770 Fluid overload, unspecified: Secondary | ICD-10-CM | POA: Diagnosis not present

## 2023-06-18 DIAGNOSIS — R7301 Impaired fasting glucose: Secondary | ICD-10-CM | POA: Diagnosis not present

## 2023-06-18 DIAGNOSIS — E559 Vitamin D deficiency, unspecified: Secondary | ICD-10-CM | POA: Diagnosis not present

## 2023-06-18 DIAGNOSIS — N186 End stage renal disease: Secondary | ICD-10-CM | POA: Diagnosis not present

## 2023-06-18 DIAGNOSIS — R946 Abnormal results of thyroid function studies: Secondary | ICD-10-CM | POA: Diagnosis not present

## 2023-06-18 DIAGNOSIS — D509 Iron deficiency anemia, unspecified: Secondary | ICD-10-CM | POA: Diagnosis not present

## 2023-06-18 DIAGNOSIS — Z992 Dependence on renal dialysis: Secondary | ICD-10-CM | POA: Diagnosis not present

## 2023-06-20 DIAGNOSIS — N186 End stage renal disease: Secondary | ICD-10-CM | POA: Diagnosis not present

## 2023-06-20 DIAGNOSIS — D509 Iron deficiency anemia, unspecified: Secondary | ICD-10-CM | POA: Diagnosis not present

## 2023-06-20 DIAGNOSIS — Z992 Dependence on renal dialysis: Secondary | ICD-10-CM | POA: Diagnosis not present

## 2023-06-20 DIAGNOSIS — D631 Anemia in chronic kidney disease: Secondary | ICD-10-CM | POA: Diagnosis not present

## 2023-06-20 DIAGNOSIS — E8770 Fluid overload, unspecified: Secondary | ICD-10-CM | POA: Diagnosis not present

## 2023-06-21 DIAGNOSIS — Z992 Dependence on renal dialysis: Secondary | ICD-10-CM | POA: Diagnosis not present

## 2023-06-21 DIAGNOSIS — D509 Iron deficiency anemia, unspecified: Secondary | ICD-10-CM | POA: Diagnosis not present

## 2023-06-21 DIAGNOSIS — N186 End stage renal disease: Secondary | ICD-10-CM | POA: Diagnosis not present

## 2023-06-21 DIAGNOSIS — D631 Anemia in chronic kidney disease: Secondary | ICD-10-CM | POA: Diagnosis not present

## 2023-06-21 DIAGNOSIS — E8770 Fluid overload, unspecified: Secondary | ICD-10-CM | POA: Diagnosis not present

## 2023-06-24 DIAGNOSIS — D509 Iron deficiency anemia, unspecified: Secondary | ICD-10-CM | POA: Diagnosis not present

## 2023-06-24 DIAGNOSIS — E8770 Fluid overload, unspecified: Secondary | ICD-10-CM | POA: Diagnosis not present

## 2023-06-24 DIAGNOSIS — Z992 Dependence on renal dialysis: Secondary | ICD-10-CM | POA: Diagnosis not present

## 2023-06-24 DIAGNOSIS — N186 End stage renal disease: Secondary | ICD-10-CM | POA: Diagnosis not present

## 2023-06-24 DIAGNOSIS — D631 Anemia in chronic kidney disease: Secondary | ICD-10-CM | POA: Diagnosis not present

## 2023-06-25 DIAGNOSIS — E8770 Fluid overload, unspecified: Secondary | ICD-10-CM | POA: Diagnosis not present

## 2023-06-25 DIAGNOSIS — N186 End stage renal disease: Secondary | ICD-10-CM | POA: Diagnosis not present

## 2023-06-25 DIAGNOSIS — D631 Anemia in chronic kidney disease: Secondary | ICD-10-CM | POA: Diagnosis not present

## 2023-06-25 DIAGNOSIS — Z992 Dependence on renal dialysis: Secondary | ICD-10-CM | POA: Diagnosis not present

## 2023-06-25 DIAGNOSIS — D509 Iron deficiency anemia, unspecified: Secondary | ICD-10-CM | POA: Diagnosis not present

## 2023-06-26 DIAGNOSIS — D509 Iron deficiency anemia, unspecified: Secondary | ICD-10-CM | POA: Diagnosis not present

## 2023-06-26 DIAGNOSIS — Z992 Dependence on renal dialysis: Secondary | ICD-10-CM | POA: Diagnosis not present

## 2023-06-26 DIAGNOSIS — E8770 Fluid overload, unspecified: Secondary | ICD-10-CM | POA: Diagnosis not present

## 2023-06-26 DIAGNOSIS — N186 End stage renal disease: Secondary | ICD-10-CM | POA: Diagnosis not present

## 2023-06-26 DIAGNOSIS — D631 Anemia in chronic kidney disease: Secondary | ICD-10-CM | POA: Diagnosis not present

## 2023-06-27 DIAGNOSIS — D509 Iron deficiency anemia, unspecified: Secondary | ICD-10-CM | POA: Diagnosis not present

## 2023-06-27 DIAGNOSIS — D631 Anemia in chronic kidney disease: Secondary | ICD-10-CM | POA: Diagnosis not present

## 2023-06-27 DIAGNOSIS — E8770 Fluid overload, unspecified: Secondary | ICD-10-CM | POA: Diagnosis not present

## 2023-06-27 DIAGNOSIS — N186 End stage renal disease: Secondary | ICD-10-CM | POA: Diagnosis not present

## 2023-06-27 DIAGNOSIS — Z992 Dependence on renal dialysis: Secondary | ICD-10-CM | POA: Diagnosis not present

## 2023-06-28 ENCOUNTER — Encounter (HOSPITAL_COMMUNITY): Payer: Self-pay

## 2023-06-28 ENCOUNTER — Other Ambulatory Visit: Payer: Self-pay

## 2023-06-28 ENCOUNTER — Emergency Department (HOSPITAL_COMMUNITY)

## 2023-06-28 ENCOUNTER — Emergency Department (HOSPITAL_COMMUNITY)
Admission: EM | Admit: 2023-06-28 | Discharge: 2023-06-28 | Disposition: A | Discharge status: Home / Self Care | Attending: Emergency Medicine | Admitting: Emergency Medicine

## 2023-06-28 DIAGNOSIS — J181 Lobar pneumonia, unspecified organism: Secondary | ICD-10-CM | POA: Insufficient documentation

## 2023-06-28 DIAGNOSIS — J189 Pneumonia, unspecified organism: Secondary | ICD-10-CM

## 2023-06-28 DIAGNOSIS — R918 Other nonspecific abnormal finding of lung field: Secondary | ICD-10-CM | POA: Diagnosis not present

## 2023-06-28 DIAGNOSIS — Z7901 Long term (current) use of anticoagulants: Secondary | ICD-10-CM | POA: Insufficient documentation

## 2023-06-28 DIAGNOSIS — E8770 Fluid overload, unspecified: Secondary | ICD-10-CM | POA: Diagnosis not present

## 2023-06-28 DIAGNOSIS — R410 Disorientation, unspecified: Secondary | ICD-10-CM | POA: Diagnosis not present

## 2023-06-28 DIAGNOSIS — N186 End stage renal disease: Secondary | ICD-10-CM | POA: Diagnosis not present

## 2023-06-28 DIAGNOSIS — R531 Weakness: Secondary | ICD-10-CM | POA: Diagnosis not present

## 2023-06-28 DIAGNOSIS — R0689 Other abnormalities of breathing: Secondary | ICD-10-CM | POA: Diagnosis not present

## 2023-06-28 DIAGNOSIS — I10 Essential (primary) hypertension: Secondary | ICD-10-CM | POA: Diagnosis not present

## 2023-06-28 DIAGNOSIS — I700 Atherosclerosis of aorta: Secondary | ICD-10-CM | POA: Diagnosis not present

## 2023-06-28 DIAGNOSIS — D631 Anemia in chronic kidney disease: Secondary | ICD-10-CM | POA: Diagnosis not present

## 2023-06-28 DIAGNOSIS — D509 Iron deficiency anemia, unspecified: Secondary | ICD-10-CM | POA: Diagnosis not present

## 2023-06-28 DIAGNOSIS — Z992 Dependence on renal dialysis: Secondary | ICD-10-CM | POA: Diagnosis not present

## 2023-06-28 LAB — CBC WITH DIFFERENTIAL/PLATELET
Abs Immature Granulocytes: 0.02 10*3/uL (ref 0.00–0.07)
Basophils Absolute: 0.1 10*3/uL (ref 0.0–0.1)
Basophils Relative: 1 %
Eosinophils Absolute: 0.1 10*3/uL (ref 0.0–0.5)
Eosinophils Relative: 3 %
HCT: 35.3 % — ABNORMAL LOW (ref 39.0–52.0)
Hemoglobin: 11.3 g/dL — ABNORMAL LOW (ref 13.0–17.0)
Immature Granulocytes: 0 %
Lymphocytes Relative: 28 %
Lymphs Abs: 1.3 10*3/uL (ref 0.7–4.0)
MCH: 31.7 pg (ref 26.0–34.0)
MCHC: 32 g/dL (ref 30.0–36.0)
MCV: 99.2 fL (ref 80.0–100.0)
Monocytes Absolute: 0.4 10*3/uL (ref 0.1–1.0)
Monocytes Relative: 10 %
Neutro Abs: 2.7 10*3/uL (ref 1.7–7.7)
Neutrophils Relative %: 58 %
Platelets: 175 10*3/uL (ref 150–400)
RBC: 3.56 MIL/uL — ABNORMAL LOW (ref 4.22–5.81)
RDW: 15.2 % (ref 11.5–15.5)
WBC: 4.6 10*3/uL (ref 4.0–10.5)
nRBC: 0 % (ref 0.0–0.2)

## 2023-06-28 LAB — COMPREHENSIVE METABOLIC PANEL WITH GFR
ALT: 9 U/L (ref 0–44)
AST: 12 U/L — ABNORMAL LOW (ref 15–41)
Albumin: 4 g/dL (ref 3.5–5.0)
Alkaline Phosphatase: 42 U/L (ref 38–126)
Anion gap: 14 (ref 5–15)
BUN: 34 mg/dL — ABNORMAL HIGH (ref 8–23)
CO2: 31 mmol/L (ref 22–32)
Calcium: 9.7 mg/dL (ref 8.9–10.3)
Chloride: 91 mmol/L — ABNORMAL LOW (ref 98–111)
Creatinine, Ser: 6.34 mg/dL — ABNORMAL HIGH (ref 0.61–1.24)
GFR, Estimated: 8 mL/min — ABNORMAL LOW
Glucose, Bld: 95 mg/dL (ref 70–99)
Potassium: 4.2 mmol/L (ref 3.5–5.1)
Sodium: 136 mmol/L (ref 135–145)
Total Bilirubin: 0.6 mg/dL (ref 0.0–1.2)
Total Protein: 7.6 g/dL (ref 6.5–8.1)

## 2023-06-28 MED ORDER — LEVOTHYROXINE SODIUM 50 MCG PO TABS: 50.0000 ug | ORAL_TABLET | Freq: Every day | ORAL | 0 refills | Status: DC

## 2023-06-28 MED ORDER — AZITHROMYCIN 250 MG PO TABS: 250.0000 mg | ORAL_TABLET | Freq: Every day | ORAL | 0 refills | Status: DC

## 2023-06-28 MED ORDER — AMOXICILLIN-POT CLAVULANATE 875-125 MG PO TABS
1.0000 | ORAL_TABLET | Freq: Once | ORAL | Status: AC
  Administered 2023-06-28: 1 via ORAL
  Filled 2023-06-28: qty 1

## 2023-06-28 MED ORDER — AMOXICILLIN-POT CLAVULANATE 875-125 MG PO TABS: 1.0000 | ORAL_TABLET | Freq: Two times a day (BID) | ORAL | 0 refills | Status: DC

## 2023-06-28 MED ORDER — SODIUM CHLORIDE 0.9 % IV BOLUS
500.0000 mL | Freq: Once | INTRAVENOUS | Status: AC
Start: 2023-06-28 — End: 2023-06-28
  Administered 2023-06-28: 500 mL via INTRAVENOUS

## 2023-06-28 MED ORDER — DOXYCYCLINE HYCLATE 100 MG PO CAPS: 100.0000 mg | ORAL_CAPSULE | Freq: Two times a day (BID) | ORAL | 0 refills | Status: DC

## 2023-06-28 MED ORDER — AZITHROMYCIN 250 MG PO TABS
500.0000 mg | ORAL_TABLET | Freq: Once | ORAL | Status: DC
  Filled 2023-06-28: qty 2

## 2023-06-28 MED ORDER — DOXYCYCLINE HYCLATE 100 MG PO TABS
100.0000 mg | ORAL_TABLET | Freq: Once | ORAL | Status: AC
  Administered 2023-06-28: 100 mg via ORAL
  Filled 2023-06-28: qty 1

## 2023-06-28 NOTE — ED Notes (Signed)
 MD made aware of the mL in pt's bladder. Pt given a urinal to try and give a urine sample. RN Notified

## 2023-06-28 NOTE — Discharge Instructions (Addendum)
 You have been diagnosed with pneumonia.  It is important to complete your antibiotics, follow-up with your physician, and monitor your condition carefully.  Return here for concerning changes in your condition.

## 2023-06-28 NOTE — ED Triage Notes (Signed)
 Pt reports he does dialysis 4 x a week in his home.  Pt reports he had dialysis yesterday but when they showed up today he just  "does not feel well", Pt denies any N/V/D or constipation, and no fever, chills, cough, congestion or headache.

## 2023-06-28 NOTE — ED Provider Notes (Signed)
 [place] EMERGENCY DEPARTMENT AT [facility] Provider Note   CSN: [id number] Arrival date & time: 06/28/23  1333     History  Chief Complaint  Patient presents with   Fatigue    Glenn Olson is a 83 y.o. male.  HPI Patient presents from home at the behest of his dialysis team.  He receives dialysis Monday Wednesday Friday, including today.  After dialysis his nurse thought he was weaker than usual.  He was sent here via EMS.  Patient denies focal weakness, denies focal pain, denies confusion, is amenable to evaluation.    Home Medications Prior to Admission medications   Medication Sig Start Date End Date Taking? Authorizing Provider  amoxicillin-clavulanate (AUGMENTIN) 875-125 MG tablet Take 1 tablet by mouth every 12 (twelve) hours. 06/28/23  Yes Dorenda Gandy, MD  azithromycin (ZITHROMAX) 250 MG tablet Take 1 tablet (250 mg total) by mouth daily for 4 days. Take 1 every day until finished. 06/28/23 07/02/23 Yes Dorenda Gandy, MD  amiodarone  (PACERONE ) 200 MG tablet Take 1 tablet (200 mg total) by mouth daily. 11/16/22   Fenton, Clint R, PA  apixaban  (ELIQUIS ) 2.5 MG TABS tablet TAKE 1 TABLET TWICE A DAY 01/28/23   Fenton, Clint R, PA  atorvastatin (LIPITOR) 10 MG tablet 10 mg daily. 02/06/23   [provider]  calcitRIOL  (ROCALTROL ) 0.5 MCG capsule Take 1 capsule (0.5 mcg total) by mouth daily. 06/28/22   Love, Renay Carota, PA-C  loratadine  (CLARITIN ) 10 MG tablet Take 1 tablet (10 mg total) by mouth daily. 06/29/22   Love, Renay Carota, PA-C  melatonin 3 MG TABS tablet Take 1 tablet (3 mg total) by mouth at bedtime as needed. Patient taking differently: Take 3 mg by mouth at bedtime as needed (Sleep). 06/28/22   Love, Renay Carota, PA-C  Methoxy PEG-Epoetin  Beta (MIRCERA IJ) Mircera 11/03/22 11/02/23  [provider]  metoprolol  succinate (TOPROL  XL) 25 MG 24 hr tablet Take 0.5 tablets (12.5 mg total) by mouth daily. 03/14/23   Nishan, Peter C, MD   oxyCODONE -acetaminophen  (PERCOCET) 5-325 MG tablet Take 1 tablet by mouth every 6 (six) hours as needed for severe pain (pain score 7-10). 01/11/23   Rhyne, Samantha J, PA-C  VELPHORO 500 MG chewable tablet Chew 500 mg by mouth 3 (three) times daily. 10/30/22   [provider]  vitamin D3 (CHOLECALCIFEROL ) 25 MCG tablet Take 1 tablet (1,000 Units total) by mouth daily. 06/29/22   Zelda Hickman, PA-C      Allergies    Patient has no known allergies.    Review of Systems   Review of Systems  Physical Exam Updated Vital Signs BP (!) 141/78   Pulse 67   Temp 97.9 F (36.6 C)   Resp 17   Ht 6' (1.829 m)   Wt 80.9 kg   SpO2 97%   BMI 24.19 kg/m  Physical Exam Vitals and nursing note reviewed.  Constitutional:      General: He is not in acute distress.    Appearance: He is well-developed.  HENT:     Head: Normocephalic and atraumatic.  Eyes:     Conjunctiva/sclera: Conjunctivae normal.  Cardiovascular:     Rate and Rhythm: Normal rate and regular rhythm.  Pulmonary:     Effort: Pulmonary effort is normal. No respiratory distress.     Breath sounds: No stridor.  Chest:    Abdominal:     General: There is no distension.    Skin:    General:  Skin is warm and dry.  Neurological:     Mental Status: He is alert and oriented to person, place, and time.     ED Results / Procedures / Treatments   Labs (all labs ordered are listed, but only abnormal results are displayed) Labs Reviewed  COMPREHENSIVE METABOLIC PANEL WITH GFR - Abnormal; Notable for the following components:      Result Value   Chloride 91 (*)    BUN 34 (*)    Creatinine, Ser 6.34 (*)    AST 12 (*)    GFR, Estimated 8 (*)    All other components within normal limits  CBC WITH DIFFERENTIAL/PLATELET - Abnormal; Notable for the following components:   RBC 3.56 (*)    Hemoglobin 11.3 (*)    HCT 35.3 (*)    All other components within normal limits    EKG EKG  Interpretation Date/Time:  Friday Jun 28 2023 14:24:49 EDT Ventricular Rate:  64 PR Interval:  243 QRS Duration:  162 QT Interval:  509 QTC Calculation: 526 R Axis:   -84  Text Interpretation: Sinus rhythm Prolonged PR interval RBBB and LAFB Confirmed by Dorenda Gandy [phone]) on 06/28/2023 4:04:13 PM  Radiology DG Chest 2 View Result Date: 06/28/2023 CLINICAL DATA:  Weakness. EXAM: CHEST - 2 VIEW COMPARISON:  04/03/2023 FINDINGS: Right-sided dialysis catheter remains in place. The heart is normal in size. Stable mediastinal contours. Aortic atherosclerosis. Ill-defined opacity at the left lung base. There may be a small left pleural effusion. No pulmonary edema. No pneumothorax. IMPRESSION: Ill-defined opacity at the left lung base, atelectasis versus pneumonia. Possible small left pleural effusion. Electronically Signed   By: Chadwick Colonel M.D.   On: 06/28/2023 17:26    Procedures Procedures    Medications Ordered in ED Medications  azithromycin (ZITHROMAX) tablet 500 mg (has no administration in time range)  amoxicillin-clavulanate (AUGMENTIN) 875-125 MG per tablet 1 tablet (has no administration in time range)  sodium chloride  0.9 % bolus 500 mL (500 mLs Intravenous New Bag/Given 06/28/23 1552)    ED Course/ Medical Decision Making/ A&P                                 Medical Decision Making Elderly male on dialysis presents with weakness.  Patient is awake, alert, hemodynamically unremarkable.  Given his age, relative immunocompromise state, concern for infection, dehydration, electrolyte imbalance.  EMS reports patient was mildly orthostatic, and he is initially clinically, hypovolemic.  Patient received 500 mL bolus, continuous monitoring, labs x-ray. Cardiac 65 sinus normal pulse ox 100% room air normal  Amount and/or Complexity of Data Reviewed Independent Historian: EMS External Data Reviewed: notes. Labs: ordered. Decision-making details documented in ED  Course. Radiology: ordered and independent interpretation performed. Decision-making details documented in ED Course. ECG/medicine tests: ordered and independent interpretation performed. Decision-making details documented in ED Course.  Risk Prescription drug management. Decision regarding hospitalization. Diagnosis or treatment significantly limited by social determinants of health.   6:06 PM On repeat exam patient sitting upright, now with mild cough.  I have reviewed the x-ray, labs, and how he is accompanied by his daughter-in-law who lives with him.  We discussed all findings. Patient's labs unremarkable, electrolytes within normal limits, creatinine abnormal as expected.  X-ray with some evidence for opacification concerning for pneumonia, but patient is afebrile, awake, alert, has no leukocytosis, no fever, no hypotension.  No evidence for, bacteremia, sepsis.  With concern  pneumonia patient will start antibiotics.        Final Clinical Impression(s) / ED Diagnoses Final diagnoses:  Community acquired pneumonia of left lower lobe of lung    Rx / DC Orders ED Discharge Orders          Ordered    amoxicillin-clavulanate (AUGMENTIN) 875-125 MG tablet  Every 12 hours        06/28/23 1806    azithromycin (ZITHROMAX) 250 MG tablet  Daily        06/28/23 1806              Dorenda Gandy, MD 06/28/23 1807

## 2023-06-29 DIAGNOSIS — D509 Iron deficiency anemia, unspecified: Secondary | ICD-10-CM | POA: Diagnosis not present

## 2023-06-29 DIAGNOSIS — Z992 Dependence on renal dialysis: Secondary | ICD-10-CM | POA: Diagnosis not present

## 2023-06-29 DIAGNOSIS — N186 End stage renal disease: Secondary | ICD-10-CM | POA: Diagnosis not present

## 2023-06-29 DIAGNOSIS — D631 Anemia in chronic kidney disease: Secondary | ICD-10-CM | POA: Diagnosis not present

## 2023-06-29 DIAGNOSIS — E8770 Fluid overload, unspecified: Secondary | ICD-10-CM | POA: Diagnosis not present

## 2023-06-30 DIAGNOSIS — I129 Hypertensive chronic kidney disease with stage 1 through stage 4 chronic kidney disease, or unspecified chronic kidney disease: Secondary | ICD-10-CM | POA: Diagnosis not present

## 2023-06-30 DIAGNOSIS — N186 End stage renal disease: Secondary | ICD-10-CM | POA: Diagnosis not present

## 2023-06-30 DIAGNOSIS — Z992 Dependence on renal dialysis: Secondary | ICD-10-CM | POA: Diagnosis not present

## 2023-07-01 ENCOUNTER — Other Ambulatory Visit: Admitting: Urology

## 2023-07-01 DIAGNOSIS — N186 End stage renal disease: Secondary | ICD-10-CM | POA: Diagnosis not present

## 2023-07-01 DIAGNOSIS — D631 Anemia in chronic kidney disease: Secondary | ICD-10-CM | POA: Diagnosis not present

## 2023-07-01 DIAGNOSIS — E8770 Fluid overload, unspecified: Secondary | ICD-10-CM | POA: Diagnosis not present

## 2023-07-01 DIAGNOSIS — N2581 Secondary hyperparathyroidism of renal origin: Secondary | ICD-10-CM | POA: Diagnosis not present

## 2023-07-01 DIAGNOSIS — D509 Iron deficiency anemia, unspecified: Secondary | ICD-10-CM | POA: Diagnosis not present

## 2023-07-01 DIAGNOSIS — Z992 Dependence on renal dialysis: Secondary | ICD-10-CM | POA: Diagnosis not present

## 2023-07-02 DIAGNOSIS — D631 Anemia in chronic kidney disease: Secondary | ICD-10-CM | POA: Diagnosis not present

## 2023-07-02 DIAGNOSIS — D509 Iron deficiency anemia, unspecified: Secondary | ICD-10-CM | POA: Diagnosis not present

## 2023-07-02 DIAGNOSIS — E8770 Fluid overload, unspecified: Secondary | ICD-10-CM | POA: Diagnosis not present

## 2023-07-02 DIAGNOSIS — N186 End stage renal disease: Secondary | ICD-10-CM | POA: Diagnosis not present

## 2023-07-02 DIAGNOSIS — Z992 Dependence on renal dialysis: Secondary | ICD-10-CM | POA: Diagnosis not present

## 2023-07-03 DIAGNOSIS — E8770 Fluid overload, unspecified: Secondary | ICD-10-CM | POA: Diagnosis not present

## 2023-07-03 DIAGNOSIS — Z992 Dependence on renal dialysis: Secondary | ICD-10-CM | POA: Diagnosis not present

## 2023-07-03 DIAGNOSIS — D509 Iron deficiency anemia, unspecified: Secondary | ICD-10-CM | POA: Diagnosis not present

## 2023-07-03 DIAGNOSIS — D631 Anemia in chronic kidney disease: Secondary | ICD-10-CM | POA: Diagnosis not present

## 2023-07-03 DIAGNOSIS — N186 End stage renal disease: Secondary | ICD-10-CM | POA: Diagnosis not present

## 2023-07-04 DIAGNOSIS — D509 Iron deficiency anemia, unspecified: Secondary | ICD-10-CM | POA: Diagnosis not present

## 2023-07-04 DIAGNOSIS — E8770 Fluid overload, unspecified: Secondary | ICD-10-CM | POA: Diagnosis not present

## 2023-07-04 DIAGNOSIS — N186 End stage renal disease: Secondary | ICD-10-CM | POA: Diagnosis not present

## 2023-07-04 DIAGNOSIS — D631 Anemia in chronic kidney disease: Secondary | ICD-10-CM | POA: Diagnosis not present

## 2023-07-04 DIAGNOSIS — E039 Hypothyroidism, unspecified: Secondary | ICD-10-CM | POA: Diagnosis not present

## 2023-07-04 DIAGNOSIS — Z992 Dependence on renal dialysis: Secondary | ICD-10-CM | POA: Diagnosis not present

## 2023-07-05 DIAGNOSIS — D509 Iron deficiency anemia, unspecified: Secondary | ICD-10-CM | POA: Diagnosis not present

## 2023-07-05 DIAGNOSIS — N186 End stage renal disease: Secondary | ICD-10-CM | POA: Diagnosis not present

## 2023-07-05 DIAGNOSIS — Z992 Dependence on renal dialysis: Secondary | ICD-10-CM | POA: Diagnosis not present

## 2023-07-05 DIAGNOSIS — D631 Anemia in chronic kidney disease: Secondary | ICD-10-CM | POA: Diagnosis not present

## 2023-07-05 DIAGNOSIS — E8770 Fluid overload, unspecified: Secondary | ICD-10-CM | POA: Diagnosis not present

## 2023-07-07 DIAGNOSIS — D509 Iron deficiency anemia, unspecified: Secondary | ICD-10-CM | POA: Diagnosis not present

## 2023-07-07 DIAGNOSIS — Z992 Dependence on renal dialysis: Secondary | ICD-10-CM | POA: Diagnosis not present

## 2023-07-07 DIAGNOSIS — D631 Anemia in chronic kidney disease: Secondary | ICD-10-CM | POA: Diagnosis not present

## 2023-07-07 DIAGNOSIS — N186 End stage renal disease: Secondary | ICD-10-CM | POA: Diagnosis not present

## 2023-07-07 DIAGNOSIS — E8770 Fluid overload, unspecified: Secondary | ICD-10-CM | POA: Diagnosis not present

## 2023-07-08 DIAGNOSIS — D631 Anemia in chronic kidney disease: Secondary | ICD-10-CM | POA: Diagnosis not present

## 2023-07-08 DIAGNOSIS — E8770 Fluid overload, unspecified: Secondary | ICD-10-CM | POA: Diagnosis not present

## 2023-07-08 DIAGNOSIS — D509 Iron deficiency anemia, unspecified: Secondary | ICD-10-CM | POA: Diagnosis not present

## 2023-07-08 DIAGNOSIS — N186 End stage renal disease: Secondary | ICD-10-CM | POA: Diagnosis not present

## 2023-07-08 DIAGNOSIS — Z992 Dependence on renal dialysis: Secondary | ICD-10-CM | POA: Diagnosis not present

## 2023-07-09 DIAGNOSIS — D631 Anemia in chronic kidney disease: Secondary | ICD-10-CM | POA: Diagnosis not present

## 2023-07-09 DIAGNOSIS — D509 Iron deficiency anemia, unspecified: Secondary | ICD-10-CM | POA: Diagnosis not present

## 2023-07-09 DIAGNOSIS — E8770 Fluid overload, unspecified: Secondary | ICD-10-CM | POA: Diagnosis not present

## 2023-07-09 DIAGNOSIS — Z992 Dependence on renal dialysis: Secondary | ICD-10-CM | POA: Diagnosis not present

## 2023-07-09 DIAGNOSIS — N186 End stage renal disease: Secondary | ICD-10-CM | POA: Diagnosis not present

## 2023-07-10 DIAGNOSIS — Z992 Dependence on renal dialysis: Secondary | ICD-10-CM | POA: Diagnosis not present

## 2023-07-10 DIAGNOSIS — E8770 Fluid overload, unspecified: Secondary | ICD-10-CM | POA: Diagnosis not present

## 2023-07-10 DIAGNOSIS — D631 Anemia in chronic kidney disease: Secondary | ICD-10-CM | POA: Diagnosis not present

## 2023-07-10 DIAGNOSIS — D509 Iron deficiency anemia, unspecified: Secondary | ICD-10-CM | POA: Diagnosis not present

## 2023-07-10 DIAGNOSIS — N186 End stage renal disease: Secondary | ICD-10-CM | POA: Diagnosis not present

## 2023-07-11 DIAGNOSIS — Z992 Dependence on renal dialysis: Secondary | ICD-10-CM | POA: Diagnosis not present

## 2023-07-11 DIAGNOSIS — E8770 Fluid overload, unspecified: Secondary | ICD-10-CM | POA: Diagnosis not present

## 2023-07-11 DIAGNOSIS — N186 End stage renal disease: Secondary | ICD-10-CM | POA: Diagnosis not present

## 2023-07-11 DIAGNOSIS — D631 Anemia in chronic kidney disease: Secondary | ICD-10-CM | POA: Diagnosis not present

## 2023-07-11 DIAGNOSIS — D509 Iron deficiency anemia, unspecified: Secondary | ICD-10-CM | POA: Diagnosis not present

## 2023-07-12 DIAGNOSIS — N186 End stage renal disease: Secondary | ICD-10-CM | POA: Diagnosis not present

## 2023-07-12 DIAGNOSIS — D509 Iron deficiency anemia, unspecified: Secondary | ICD-10-CM | POA: Diagnosis not present

## 2023-07-12 DIAGNOSIS — D631 Anemia in chronic kidney disease: Secondary | ICD-10-CM | POA: Diagnosis not present

## 2023-07-12 DIAGNOSIS — E8770 Fluid overload, unspecified: Secondary | ICD-10-CM | POA: Diagnosis not present

## 2023-07-12 DIAGNOSIS — Z992 Dependence on renal dialysis: Secondary | ICD-10-CM | POA: Diagnosis not present

## 2023-07-15 DIAGNOSIS — D509 Iron deficiency anemia, unspecified: Secondary | ICD-10-CM | POA: Diagnosis not present

## 2023-07-15 DIAGNOSIS — D631 Anemia in chronic kidney disease: Secondary | ICD-10-CM | POA: Diagnosis not present

## 2023-07-15 DIAGNOSIS — Z992 Dependence on renal dialysis: Secondary | ICD-10-CM | POA: Diagnosis not present

## 2023-07-15 DIAGNOSIS — E8770 Fluid overload, unspecified: Secondary | ICD-10-CM | POA: Diagnosis not present

## 2023-07-15 DIAGNOSIS — N186 End stage renal disease: Secondary | ICD-10-CM | POA: Diagnosis not present

## 2023-07-16 DIAGNOSIS — D631 Anemia in chronic kidney disease: Secondary | ICD-10-CM | POA: Diagnosis not present

## 2023-07-16 DIAGNOSIS — N186 End stage renal disease: Secondary | ICD-10-CM | POA: Diagnosis not present

## 2023-07-16 DIAGNOSIS — E8770 Fluid overload, unspecified: Secondary | ICD-10-CM | POA: Diagnosis not present

## 2023-07-16 DIAGNOSIS — D509 Iron deficiency anemia, unspecified: Secondary | ICD-10-CM | POA: Diagnosis not present

## 2023-07-16 DIAGNOSIS — Z992 Dependence on renal dialysis: Secondary | ICD-10-CM | POA: Diagnosis not present

## 2023-07-17 ENCOUNTER — Other Ambulatory Visit: Payer: Self-pay

## 2023-07-17 ENCOUNTER — Ambulatory Visit (HOSPITAL_COMMUNITY)
Admission: RE | Admit: 2023-07-17 | Discharge: 2023-07-17 | Disposition: A | Discharge status: Home / Self Care | Attending: Vascular Surgery | Admitting: Vascular Surgery

## 2023-07-17 ENCOUNTER — Encounter (HOSPITAL_COMMUNITY)
Admission: RE | Disposition: A | Discharge status: Home / Self Care | Payer: Self-pay | Source: Home / Self Care | Attending: Vascular Surgery

## 2023-07-17 DIAGNOSIS — N186 End stage renal disease: Secondary | ICD-10-CM | POA: Diagnosis not present

## 2023-07-17 DIAGNOSIS — Y832 Surgical operation with anastomosis, bypass or graft as the cause of abnormal reaction of the patient, or of later complication, without mention of misadventure at the time of the procedure: Secondary | ICD-10-CM | POA: Insufficient documentation

## 2023-07-17 DIAGNOSIS — Z992 Dependence on renal dialysis: Secondary | ICD-10-CM | POA: Diagnosis not present

## 2023-07-17 DIAGNOSIS — I10 Essential (primary) hypertension: Secondary | ICD-10-CM | POA: Diagnosis not present

## 2023-07-17 DIAGNOSIS — T82858A Stenosis of vascular prosthetic devices, implants and grafts, initial encounter: Secondary | ICD-10-CM

## 2023-07-17 DIAGNOSIS — T82898A Other specified complication of vascular prosthetic devices, implants and grafts, initial encounter: Secondary | ICD-10-CM

## 2023-07-17 DIAGNOSIS — E039 Hypothyroidism, unspecified: Secondary | ICD-10-CM | POA: Diagnosis not present

## 2023-07-17 DIAGNOSIS — J189 Pneumonia, unspecified organism: Secondary | ICD-10-CM | POA: Diagnosis not present

## 2023-07-17 DIAGNOSIS — I120 Hypertensive chronic kidney disease with stage 5 chronic kidney disease or end stage renal disease: Secondary | ICD-10-CM | POA: Diagnosis not present

## 2023-07-17 HISTORY — PX: A/V SHUNT INTERVENTION: CATH118220

## 2023-07-17 HISTORY — PX: VENOUS STENT: CATH118377

## 2023-07-17 HISTORY — PX: VENOUS ANGIOPLASTY: CATH118376

## 2023-07-17 SURGERY — A/V SHUNT INTERVENTION
Anesthesia: LOCAL | Site: Arm Upper | Laterality: Left

## 2023-07-17 MED ORDER — LIDOCAINE HCL (PF) 1 % IJ SOLN
INTRAMUSCULAR | Status: AC
  Filled 2023-07-17: qty 30

## 2023-07-17 MED ORDER — HEPARIN SODIUM (PORCINE) 1000 UNIT/ML IJ SOLN
INTRAMUSCULAR | Status: AC
  Filled 2023-07-17: qty 10

## 2023-07-17 MED ORDER — LIDOCAINE HCL (PF) 1 % IJ SOLN
INTRAMUSCULAR | Status: DC | PRN
  Administered 2023-07-17: 2 mL via SUBCUTANEOUS

## 2023-07-17 MED ORDER — HEPARIN (PORCINE) IN NACL 1000-0.9 UT/500ML-% IV SOLN
INTRAVENOUS | Status: DC | PRN
  Administered 2023-07-17: 500 mL

## 2023-07-17 MED ORDER — HEPARIN SODIUM (PORCINE) 1000 UNIT/ML IJ SOLN
INTRAMUSCULAR | Status: DC | PRN
  Administered 2023-07-17 (×2): 1900 [IU] via INTRAVENOUS

## 2023-07-17 MED ORDER — IODIXANOL 320 MG/ML IV SOLN
INTRAVENOUS | Status: DC | PRN
  Administered 2023-07-17: 20 mL via INTRAVENOUS

## 2023-07-17 SURGICAL SUPPLY — 10 items
BALLOON MUSTANG 7X80X75 (BALLOONS) IMPLANT
KIT ENCORE 26 ADVANTAGE (KITS) IMPLANT
KIT MICROPUNCTURE NIT STIFF (SHEATH) IMPLANT
SHEATH PINNACLE R/O II 6F 4CM (SHEATH) IMPLANT
SHEATH PROBE COVER 6X72 (BAG) IMPLANT
STENT VIABAHN 7X50X75 (Permanent Stent) IMPLANT
STOPCOCK MORSE 400PSI 3WAY (MISCELLANEOUS) IMPLANT
TRAY PV CATH (CUSTOM PROCEDURE TRAY) ×2 IMPLANT
TUBING CIL FLEX 10 FLL-RA (TUBING) IMPLANT
WIRE BENTSON .035X145CM (WIRE) IMPLANT

## 2023-07-17 NOTE — H&P (Addendum)
 HD ACCESS CENTER H&P   Patient ID: Cross Jorge, male   DOB: 08/19/1940, 83 y.o.   MRN: [id number]  Subjective:     HPI Taggart Prasad is a 83 y.o. 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  Past Medical History:  Diagnosis Date   Anemia    Cancer (HCC)    Chronic kidney disease    Dysrhythmia    PAF 05/30/22   GERD (gastroesophageal reflux disease)    Neurogenic bladder    Renal insufficiency    No family history on file. Past Surgical History:  Procedure Laterality Date   AV FISTULA PLACEMENT Left 09/27/2020   Procedure: LEFT ARM ARTERIOVENOUS GRAFT PLACEMENT USING GORE-TEX 4-7MM X 45CM  VASCULAR GRAFT;  Surgeon: Mayo Speck, MD;  Location: [facility];  Service: Vascular;  Laterality: Left;   AV FISTULA PLACEMENT Left 01/11/2023   Procedure: LEFT ARM ARTERIOVENOUS (AV) FISTULA CREATION;  Surgeon: Adine Hoof, MD;  Location: [facility] OR;  Service: Vascular;  Laterality: Left;   CAPD INSERTION N/A 03/17/2021   Procedure: ATTEMPTED INSERTION OF LAPAROSCOPIC CONTINUOUS AMBULATORY PERITONEAL DIALYSIS (CAPD) CATHETER;  Surgeon: Adine Hoof, MD;  Location: [facility] OR;  Service: Vascular;  Laterality: N/A;   CARDIOVERSION N/A 06/19/2022   Procedure: CARDIOVERSION;  Surgeon: Harrold Lincoln, MD;  Location: [facility] INVASIVE CV LAB;  Service: Cardiovascular;  Laterality: N/A;   CYSTOURETHROSCOPY     INSERTION OF DIALYSIS CATHETER Right 09/27/2020   Procedure: INSERTION OF PALINDROME TUNNELED DIALYSIS CATHETER 14.82f X 23CM;  Surgeon: Mayo Speck, MD;  Location: [facility];  Service: Vascular;  Laterality: Right;   INSERTION OF DIALYSIS CATHETER Right 05/31/2022   Procedure: INSERTION OF DIALYSIS CATHETER;  Surgeon: Kayla Part, MD;  Location: [facility] OR;  Service: Vascular;  Laterality: Right;   IR REMOVAL TUN CV CATH W/O FL  06/20/2022   REMOVAL OF A DIALYSIS CATHETER N/A 12/06/2020   Procedure: MINOR REMOVAL OF A DIALYSIS CATHETER;  Surgeon: Mayo Speck, MD;  Location: [facility];  Service: Vascular;  Laterality: N/A;   SUPRAPUBIC CATHETER PLACEMENT     TEE WITHOUT CARDIOVERSION N/A 06/19/2022   Procedure: TRANSESOPHAGEAL ECHOCARDIOGRAM;  Surgeon: Harrold Lincoln, MD;  Location: [facility] INVASIVE CV LAB;  Service: Cardiovascular;  Laterality: N/A;    Short Social History:  Social History   Tobacco Use   Smoking status: Never   Smokeless tobacco: Never   Tobacco comments:    Never smoked 09/26/22  Substance Use Topics   Alcohol use: Yes    Comment: patient states he is a social drinker    No Known Allergies  No current facility-administered medications for this encounter.    REVIEW OF SYSTEMS All other systems were reviewed and are negative     Objective:   Objective   Vitals:   07/17/23 0917  BP: (!) 143/74  Pulse: 71  Resp: 12  Temp: 97.6 F (36.4 C)  TempSrc: Oral  SpO2: 95%   There is no height or weight on file to calculate BMI.  Physical Exam General: no acute distress Cardiac: hemodynamically stable Extremities: Palpable thrill and pulse in left upper arm AVF, right IJ TDC present      Assessment/Plan:   Ragan Reale is a 83 y.o. male with ESRD presenting for fistulogram.  Having issues with cannulation and a week thrill. Last HD session Monday. Reviewed risks and benefits of fistulogram with intervention and patient agreed to proceed.   Delaney Fearing, MD  [facility]

## 2023-07-17 NOTE — Op Note (Signed)
    Patient name: Glenn Olson MRN: [id number] DOB: October 11, 1940 Sex: male  07/17/2023 Pre-operative Diagnosis: ESRD on HD, having issues with cannulation and a weak thrill.Aaron Aas Post-operative diagnosis:  Same Surgeon:  Philipp Brawn, MD Procedure Performed:  Ultrasound-guided access of the left arm aVF Fistulogram and central venogram Stenting and balloon angioplasty of left arm cephalic arch, 7 mm x 5 cm Viabahn, postdilated with 7 mm Mustang  Indications: Patient is a 83 year old male with ESRD on HD who presents to the HD access center for fistulogram.  He had underwent a brachiocephalic fistula creation in December 2024 for been having issues with cannulation and has continued to receive dialysis via his right IJ TDC.  His last HD session was Monday.  Risk and benefits of fistulogram with intervention were reviewed, he expressed understanding and elected to proceed.  Findings:  Patent central venous system, some mild stenosis in the SVC associated with the right IJ catheter.  Severe greater than 80% stenosis of the cephalic arch spanning an approximate 4 cm segment.  Widely patent left arm brachiocephalic fistula.  Widely patent anastomosis.   Procedure:  The patient was identified in the holding area and taken to the cath lab  The patient was then placed supine on the table and prepped and draped in the usual sterile fashion.  A time out was called.  Ultrasound was used to evaluate the left arm AV access. This was accessed under u/s guidance. An 018 wire was advanced without resistance, a micropuncture sheath was placed and fistulagram obtained which demonstrated the above findings.  This access was then upsized to a 14F short sheath over a Bentson wire.  The Bentson wire was then used to cross the stenosis.  I originally tried to balloon with a 7 mm Mustang balloon although the lesion completely recoiled.  Therefore a 7 mm x 5 cm Viabahn stent was deployed across this lesion and postdilated with  a 7 mm Mustang balloon.  A completion angiogram demonstrated an excellent result with minimal residual stenosis.  The sheath and wire were removed and the access was managed with a 4 Monocryl figure-of-eight suture for hemostasis.  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  Treated with a 7 mm x 5 cm Viabahn stent   Philipp Brawn MD [facility] [place] Office: [phone]

## 2023-07-18 ENCOUNTER — Encounter (HOSPITAL_COMMUNITY): Payer: Self-pay | Admitting: Vascular Surgery

## 2023-07-18 DIAGNOSIS — D509 Iron deficiency anemia, unspecified: Secondary | ICD-10-CM | POA: Diagnosis not present

## 2023-07-18 DIAGNOSIS — N186 End stage renal disease: Secondary | ICD-10-CM | POA: Diagnosis not present

## 2023-07-18 DIAGNOSIS — D631 Anemia in chronic kidney disease: Secondary | ICD-10-CM | POA: Diagnosis not present

## 2023-07-18 DIAGNOSIS — E8770 Fluid overload, unspecified: Secondary | ICD-10-CM | POA: Diagnosis not present

## 2023-07-18 DIAGNOSIS — Z992 Dependence on renal dialysis: Secondary | ICD-10-CM | POA: Diagnosis not present

## 2023-07-19 DIAGNOSIS — N186 End stage renal disease: Secondary | ICD-10-CM | POA: Diagnosis not present

## 2023-07-19 DIAGNOSIS — D509 Iron deficiency anemia, unspecified: Secondary | ICD-10-CM | POA: Diagnosis not present

## 2023-07-19 DIAGNOSIS — E8770 Fluid overload, unspecified: Secondary | ICD-10-CM | POA: Diagnosis not present

## 2023-07-19 DIAGNOSIS — D631 Anemia in chronic kidney disease: Secondary | ICD-10-CM | POA: Diagnosis not present

## 2023-07-19 DIAGNOSIS — Z992 Dependence on renal dialysis: Secondary | ICD-10-CM | POA: Diagnosis not present

## 2023-07-22 DIAGNOSIS — E782 Mixed hyperlipidemia: Secondary | ICD-10-CM | POA: Diagnosis not present

## 2023-07-22 DIAGNOSIS — K219 Gastro-esophageal reflux disease without esophagitis: Secondary | ICD-10-CM | POA: Diagnosis not present

## 2023-07-22 DIAGNOSIS — I10 Essential (primary) hypertension: Secondary | ICD-10-CM | POA: Diagnosis not present

## 2023-07-22 DIAGNOSIS — I480 Paroxysmal atrial fibrillation: Secondary | ICD-10-CM | POA: Diagnosis not present

## 2023-07-22 DIAGNOSIS — Z992 Dependence on renal dialysis: Secondary | ICD-10-CM | POA: Diagnosis not present

## 2023-07-22 DIAGNOSIS — E8770 Fluid overload, unspecified: Secondary | ICD-10-CM | POA: Diagnosis not present

## 2023-07-22 DIAGNOSIS — D631 Anemia in chronic kidney disease: Secondary | ICD-10-CM | POA: Diagnosis not present

## 2023-07-22 DIAGNOSIS — N184 Chronic kidney disease, stage 4 (severe): Secondary | ICD-10-CM | POA: Diagnosis not present

## 2023-07-22 DIAGNOSIS — N186 End stage renal disease: Secondary | ICD-10-CM | POA: Diagnosis not present

## 2023-07-22 DIAGNOSIS — N312 Flaccid neuropathic bladder, not elsewhere classified: Secondary | ICD-10-CM | POA: Diagnosis not present

## 2023-07-22 DIAGNOSIS — D509 Iron deficiency anemia, unspecified: Secondary | ICD-10-CM | POA: Diagnosis not present

## 2023-07-22 DIAGNOSIS — R413 Other amnesia: Secondary | ICD-10-CM | POA: Diagnosis not present

## 2023-07-22 DIAGNOSIS — E039 Hypothyroidism, unspecified: Secondary | ICD-10-CM | POA: Diagnosis not present

## 2023-07-22 DIAGNOSIS — J309 Allergic rhinitis, unspecified: Secondary | ICD-10-CM | POA: Diagnosis not present

## 2023-07-23 DIAGNOSIS — N186 End stage renal disease: Secondary | ICD-10-CM | POA: Diagnosis not present

## 2023-07-23 DIAGNOSIS — E8770 Fluid overload, unspecified: Secondary | ICD-10-CM | POA: Diagnosis not present

## 2023-07-23 DIAGNOSIS — Z992 Dependence on renal dialysis: Secondary | ICD-10-CM | POA: Diagnosis not present

## 2023-07-23 DIAGNOSIS — D631 Anemia in chronic kidney disease: Secondary | ICD-10-CM | POA: Diagnosis not present

## 2023-07-23 DIAGNOSIS — D509 Iron deficiency anemia, unspecified: Secondary | ICD-10-CM | POA: Diagnosis not present

## 2023-07-25 DIAGNOSIS — N186 End stage renal disease: Secondary | ICD-10-CM | POA: Diagnosis not present

## 2023-07-25 DIAGNOSIS — E8770 Fluid overload, unspecified: Secondary | ICD-10-CM | POA: Diagnosis not present

## 2023-07-25 DIAGNOSIS — D631 Anemia in chronic kidney disease: Secondary | ICD-10-CM | POA: Diagnosis not present

## 2023-07-25 DIAGNOSIS — D509 Iron deficiency anemia, unspecified: Secondary | ICD-10-CM | POA: Diagnosis not present

## 2023-07-25 DIAGNOSIS — Z992 Dependence on renal dialysis: Secondary | ICD-10-CM | POA: Diagnosis not present

## 2023-07-26 DIAGNOSIS — E8770 Fluid overload, unspecified: Secondary | ICD-10-CM | POA: Diagnosis not present

## 2023-07-26 DIAGNOSIS — N186 End stage renal disease: Secondary | ICD-10-CM | POA: Diagnosis not present

## 2023-07-26 DIAGNOSIS — D631 Anemia in chronic kidney disease: Secondary | ICD-10-CM | POA: Diagnosis not present

## 2023-07-26 DIAGNOSIS — D509 Iron deficiency anemia, unspecified: Secondary | ICD-10-CM | POA: Diagnosis not present

## 2023-07-26 DIAGNOSIS — Z992 Dependence on renal dialysis: Secondary | ICD-10-CM | POA: Diagnosis not present

## 2023-07-27 DIAGNOSIS — E8770 Fluid overload, unspecified: Secondary | ICD-10-CM | POA: Diagnosis not present

## 2023-07-27 DIAGNOSIS — D631 Anemia in chronic kidney disease: Secondary | ICD-10-CM | POA: Diagnosis not present

## 2023-07-27 DIAGNOSIS — N186 End stage renal disease: Secondary | ICD-10-CM | POA: Diagnosis not present

## 2023-07-27 DIAGNOSIS — Z992 Dependence on renal dialysis: Secondary | ICD-10-CM | POA: Diagnosis not present

## 2023-07-27 DIAGNOSIS — D509 Iron deficiency anemia, unspecified: Secondary | ICD-10-CM | POA: Diagnosis not present

## 2023-07-29 ENCOUNTER — Other Ambulatory Visit (HOSPITAL_COMMUNITY): Payer: Self-pay | Admitting: Physician Assistant

## 2023-07-29 DIAGNOSIS — E782 Mixed hyperlipidemia: Secondary | ICD-10-CM | POA: Diagnosis not present

## 2023-07-29 DIAGNOSIS — E8770 Fluid overload, unspecified: Secondary | ICD-10-CM | POA: Diagnosis not present

## 2023-07-29 DIAGNOSIS — D509 Iron deficiency anemia, unspecified: Secondary | ICD-10-CM | POA: Diagnosis not present

## 2023-07-29 DIAGNOSIS — D631 Anemia in chronic kidney disease: Secondary | ICD-10-CM | POA: Diagnosis not present

## 2023-07-29 DIAGNOSIS — I480 Paroxysmal atrial fibrillation: Secondary | ICD-10-CM | POA: Diagnosis not present

## 2023-07-29 DIAGNOSIS — N184 Chronic kidney disease, stage 4 (severe): Secondary | ICD-10-CM | POA: Diagnosis not present

## 2023-07-29 DIAGNOSIS — Z992 Dependence on renal dialysis: Secondary | ICD-10-CM | POA: Diagnosis not present

## 2023-07-29 DIAGNOSIS — I10 Essential (primary) hypertension: Secondary | ICD-10-CM | POA: Diagnosis not present

## 2023-07-29 DIAGNOSIS — N186 End stage renal disease: Secondary | ICD-10-CM | POA: Diagnosis not present

## 2023-07-30 DIAGNOSIS — I129 Hypertensive chronic kidney disease with stage 1 through stage 4 chronic kidney disease, or unspecified chronic kidney disease: Secondary | ICD-10-CM | POA: Diagnosis not present

## 2023-07-30 DIAGNOSIS — N186 End stage renal disease: Secondary | ICD-10-CM | POA: Diagnosis not present

## 2023-07-30 DIAGNOSIS — D509 Iron deficiency anemia, unspecified: Secondary | ICD-10-CM | POA: Diagnosis not present

## 2023-07-30 DIAGNOSIS — D631 Anemia in chronic kidney disease: Secondary | ICD-10-CM | POA: Diagnosis not present

## 2023-07-30 DIAGNOSIS — Z992 Dependence on renal dialysis: Secondary | ICD-10-CM | POA: Diagnosis not present

## 2023-07-30 DIAGNOSIS — E876 Hypokalemia: Secondary | ICD-10-CM | POA: Diagnosis not present

## 2023-07-30 DIAGNOSIS — N2581 Secondary hyperparathyroidism of renal origin: Secondary | ICD-10-CM | POA: Diagnosis not present

## 2023-07-30 DIAGNOSIS — E8770 Fluid overload, unspecified: Secondary | ICD-10-CM | POA: Diagnosis not present

## 2023-07-31 DIAGNOSIS — D631 Anemia in chronic kidney disease: Secondary | ICD-10-CM | POA: Diagnosis not present

## 2023-07-31 DIAGNOSIS — E8770 Fluid overload, unspecified: Secondary | ICD-10-CM | POA: Diagnosis not present

## 2023-07-31 DIAGNOSIS — E876 Hypokalemia: Secondary | ICD-10-CM | POA: Diagnosis not present

## 2023-07-31 DIAGNOSIS — Z992 Dependence on renal dialysis: Secondary | ICD-10-CM | POA: Diagnosis not present

## 2023-07-31 DIAGNOSIS — N186 End stage renal disease: Secondary | ICD-10-CM | POA: Diagnosis not present

## 2023-07-31 DIAGNOSIS — D509 Iron deficiency anemia, unspecified: Secondary | ICD-10-CM | POA: Diagnosis not present

## 2023-08-01 DIAGNOSIS — D631 Anemia in chronic kidney disease: Secondary | ICD-10-CM | POA: Diagnosis not present

## 2023-08-01 DIAGNOSIS — N186 End stage renal disease: Secondary | ICD-10-CM | POA: Diagnosis not present

## 2023-08-01 DIAGNOSIS — E8770 Fluid overload, unspecified: Secondary | ICD-10-CM | POA: Diagnosis not present

## 2023-08-01 DIAGNOSIS — E876 Hypokalemia: Secondary | ICD-10-CM | POA: Diagnosis not present

## 2023-08-01 DIAGNOSIS — Z992 Dependence on renal dialysis: Secondary | ICD-10-CM | POA: Diagnosis not present

## 2023-08-01 DIAGNOSIS — D509 Iron deficiency anemia, unspecified: Secondary | ICD-10-CM | POA: Diagnosis not present

## 2023-08-02 DIAGNOSIS — E8770 Fluid overload, unspecified: Secondary | ICD-10-CM | POA: Diagnosis not present

## 2023-08-02 DIAGNOSIS — Z992 Dependence on renal dialysis: Secondary | ICD-10-CM | POA: Diagnosis not present

## 2023-08-02 DIAGNOSIS — D509 Iron deficiency anemia, unspecified: Secondary | ICD-10-CM | POA: Diagnosis not present

## 2023-08-02 DIAGNOSIS — D631 Anemia in chronic kidney disease: Secondary | ICD-10-CM | POA: Diagnosis not present

## 2023-08-02 DIAGNOSIS — E876 Hypokalemia: Secondary | ICD-10-CM | POA: Diagnosis not present

## 2023-08-02 DIAGNOSIS — N186 End stage renal disease: Secondary | ICD-10-CM | POA: Diagnosis not present

## 2023-08-03 DIAGNOSIS — Z992 Dependence on renal dialysis: Secondary | ICD-10-CM | POA: Diagnosis not present

## 2023-08-03 DIAGNOSIS — D509 Iron deficiency anemia, unspecified: Secondary | ICD-10-CM | POA: Diagnosis not present

## 2023-08-03 DIAGNOSIS — N186 End stage renal disease: Secondary | ICD-10-CM | POA: Diagnosis not present

## 2023-08-03 DIAGNOSIS — E876 Hypokalemia: Secondary | ICD-10-CM | POA: Diagnosis not present

## 2023-08-03 DIAGNOSIS — E8770 Fluid overload, unspecified: Secondary | ICD-10-CM | POA: Diagnosis not present

## 2023-08-03 DIAGNOSIS — D631 Anemia in chronic kidney disease: Secondary | ICD-10-CM | POA: Diagnosis not present

## 2023-08-05 DIAGNOSIS — D509 Iron deficiency anemia, unspecified: Secondary | ICD-10-CM | POA: Diagnosis not present

## 2023-08-05 DIAGNOSIS — E876 Hypokalemia: Secondary | ICD-10-CM | POA: Diagnosis not present

## 2023-08-05 DIAGNOSIS — E8770 Fluid overload, unspecified: Secondary | ICD-10-CM | POA: Diagnosis not present

## 2023-08-05 DIAGNOSIS — D631 Anemia in chronic kidney disease: Secondary | ICD-10-CM | POA: Diagnosis not present

## 2023-08-05 DIAGNOSIS — N186 End stage renal disease: Secondary | ICD-10-CM | POA: Diagnosis not present

## 2023-08-05 DIAGNOSIS — Z992 Dependence on renal dialysis: Secondary | ICD-10-CM | POA: Diagnosis not present

## 2023-08-06 DIAGNOSIS — N186 End stage renal disease: Secondary | ICD-10-CM | POA: Diagnosis not present

## 2023-08-06 DIAGNOSIS — D631 Anemia in chronic kidney disease: Secondary | ICD-10-CM | POA: Diagnosis not present

## 2023-08-06 DIAGNOSIS — D509 Iron deficiency anemia, unspecified: Secondary | ICD-10-CM | POA: Diagnosis not present

## 2023-08-06 DIAGNOSIS — E876 Hypokalemia: Secondary | ICD-10-CM | POA: Diagnosis not present

## 2023-08-06 DIAGNOSIS — E039 Hypothyroidism, unspecified: Secondary | ICD-10-CM | POA: Diagnosis not present

## 2023-08-06 DIAGNOSIS — Z992 Dependence on renal dialysis: Secondary | ICD-10-CM | POA: Diagnosis not present

## 2023-08-06 DIAGNOSIS — E8770 Fluid overload, unspecified: Secondary | ICD-10-CM | POA: Diagnosis not present

## 2023-08-07 DIAGNOSIS — N186 End stage renal disease: Secondary | ICD-10-CM | POA: Diagnosis not present

## 2023-08-07 DIAGNOSIS — E8770 Fluid overload, unspecified: Secondary | ICD-10-CM | POA: Diagnosis not present

## 2023-08-07 DIAGNOSIS — D509 Iron deficiency anemia, unspecified: Secondary | ICD-10-CM | POA: Diagnosis not present

## 2023-08-07 DIAGNOSIS — D631 Anemia in chronic kidney disease: Secondary | ICD-10-CM | POA: Diagnosis not present

## 2023-08-07 DIAGNOSIS — Z992 Dependence on renal dialysis: Secondary | ICD-10-CM | POA: Diagnosis not present

## 2023-08-07 DIAGNOSIS — E876 Hypokalemia: Secondary | ICD-10-CM | POA: Diagnosis not present

## 2023-08-08 DIAGNOSIS — D509 Iron deficiency anemia, unspecified: Secondary | ICD-10-CM | POA: Diagnosis not present

## 2023-08-08 DIAGNOSIS — E8770 Fluid overload, unspecified: Secondary | ICD-10-CM | POA: Diagnosis not present

## 2023-08-08 DIAGNOSIS — N186 End stage renal disease: Secondary | ICD-10-CM | POA: Diagnosis not present

## 2023-08-08 DIAGNOSIS — Z992 Dependence on renal dialysis: Secondary | ICD-10-CM | POA: Diagnosis not present

## 2023-08-08 DIAGNOSIS — D631 Anemia in chronic kidney disease: Secondary | ICD-10-CM | POA: Diagnosis not present

## 2023-08-08 DIAGNOSIS — E876 Hypokalemia: Secondary | ICD-10-CM | POA: Diagnosis not present

## 2023-08-09 DIAGNOSIS — E8770 Fluid overload, unspecified: Secondary | ICD-10-CM | POA: Diagnosis not present

## 2023-08-09 DIAGNOSIS — N186 End stage renal disease: Secondary | ICD-10-CM | POA: Diagnosis not present

## 2023-08-09 DIAGNOSIS — D631 Anemia in chronic kidney disease: Secondary | ICD-10-CM | POA: Diagnosis not present

## 2023-08-09 DIAGNOSIS — E876 Hypokalemia: Secondary | ICD-10-CM | POA: Diagnosis not present

## 2023-08-09 DIAGNOSIS — Z992 Dependence on renal dialysis: Secondary | ICD-10-CM | POA: Diagnosis not present

## 2023-08-09 DIAGNOSIS — D509 Iron deficiency anemia, unspecified: Secondary | ICD-10-CM | POA: Diagnosis not present

## 2023-08-10 DIAGNOSIS — D631 Anemia in chronic kidney disease: Secondary | ICD-10-CM | POA: Diagnosis not present

## 2023-08-10 DIAGNOSIS — D509 Iron deficiency anemia, unspecified: Secondary | ICD-10-CM | POA: Diagnosis not present

## 2023-08-10 DIAGNOSIS — E876 Hypokalemia: Secondary | ICD-10-CM | POA: Diagnosis not present

## 2023-08-10 DIAGNOSIS — E8770 Fluid overload, unspecified: Secondary | ICD-10-CM | POA: Diagnosis not present

## 2023-08-10 DIAGNOSIS — Z992 Dependence on renal dialysis: Secondary | ICD-10-CM | POA: Diagnosis not present

## 2023-08-10 DIAGNOSIS — N186 End stage renal disease: Secondary | ICD-10-CM | POA: Diagnosis not present

## 2023-08-12 DIAGNOSIS — Z992 Dependence on renal dialysis: Secondary | ICD-10-CM | POA: Diagnosis not present

## 2023-08-12 DIAGNOSIS — N186 End stage renal disease: Secondary | ICD-10-CM | POA: Diagnosis not present

## 2023-08-12 DIAGNOSIS — D631 Anemia in chronic kidney disease: Secondary | ICD-10-CM | POA: Diagnosis not present

## 2023-08-12 DIAGNOSIS — E8770 Fluid overload, unspecified: Secondary | ICD-10-CM | POA: Diagnosis not present

## 2023-08-12 DIAGNOSIS — D509 Iron deficiency anemia, unspecified: Secondary | ICD-10-CM | POA: Diagnosis not present

## 2023-08-12 DIAGNOSIS — E876 Hypokalemia: Secondary | ICD-10-CM | POA: Diagnosis not present

## 2023-08-13 DIAGNOSIS — J309 Allergic rhinitis, unspecified: Secondary | ICD-10-CM | POA: Diagnosis not present

## 2023-08-13 DIAGNOSIS — D696 Thrombocytopenia, unspecified: Secondary | ICD-10-CM | POA: Diagnosis not present

## 2023-08-13 DIAGNOSIS — G4700 Insomnia, unspecified: Secondary | ICD-10-CM | POA: Diagnosis not present

## 2023-08-13 DIAGNOSIS — E876 Hypokalemia: Secondary | ICD-10-CM | POA: Diagnosis not present

## 2023-08-13 DIAGNOSIS — N186 End stage renal disease: Secondary | ICD-10-CM | POA: Diagnosis not present

## 2023-08-13 DIAGNOSIS — J189 Pneumonia, unspecified organism: Secondary | ICD-10-CM | POA: Diagnosis not present

## 2023-08-13 DIAGNOSIS — E8770 Fluid overload, unspecified: Secondary | ICD-10-CM | POA: Diagnosis not present

## 2023-08-13 DIAGNOSIS — D638 Anemia in other chronic diseases classified elsewhere: Secondary | ICD-10-CM | POA: Diagnosis not present

## 2023-08-13 DIAGNOSIS — I10 Essential (primary) hypertension: Secondary | ICD-10-CM | POA: Diagnosis not present

## 2023-08-13 DIAGNOSIS — I480 Paroxysmal atrial fibrillation: Secondary | ICD-10-CM | POA: Diagnosis not present

## 2023-08-13 DIAGNOSIS — E039 Hypothyroidism, unspecified: Secondary | ICD-10-CM | POA: Diagnosis not present

## 2023-08-13 DIAGNOSIS — D509 Iron deficiency anemia, unspecified: Secondary | ICD-10-CM | POA: Diagnosis not present

## 2023-08-13 DIAGNOSIS — Z992 Dependence on renal dialysis: Secondary | ICD-10-CM | POA: Diagnosis not present

## 2023-08-13 DIAGNOSIS — D631 Anemia in chronic kidney disease: Secondary | ICD-10-CM | POA: Diagnosis not present

## 2023-08-13 DIAGNOSIS — E782 Mixed hyperlipidemia: Secondary | ICD-10-CM | POA: Diagnosis not present

## 2023-08-13 DIAGNOSIS — I120 Hypertensive chronic kidney disease with stage 5 chronic kidney disease or end stage renal disease: Secondary | ICD-10-CM | POA: Diagnosis not present

## 2023-08-14 ENCOUNTER — Other Ambulatory Visit: Payer: Self-pay

## 2023-08-14 ENCOUNTER — Emergency Department (HOSPITAL_COMMUNITY)

## 2023-08-14 ENCOUNTER — Emergency Department (HOSPITAL_COMMUNITY)
Admission: EM | Admit: 2023-08-14 | Discharge: 2023-08-14 | Disposition: A | Discharge status: Home / Self Care | Attending: Emergency Medicine | Admitting: Emergency Medicine

## 2023-08-14 DIAGNOSIS — R0789 Other chest pain: Secondary | ICD-10-CM | POA: Insufficient documentation

## 2023-08-14 DIAGNOSIS — D631 Anemia in chronic kidney disease: Secondary | ICD-10-CM | POA: Diagnosis not present

## 2023-08-14 DIAGNOSIS — Z992 Dependence on renal dialysis: Secondary | ICD-10-CM | POA: Diagnosis not present

## 2023-08-14 DIAGNOSIS — Z452 Encounter for adjustment and management of vascular access device: Secondary | ICD-10-CM | POA: Diagnosis not present

## 2023-08-14 DIAGNOSIS — E876 Hypokalemia: Secondary | ICD-10-CM | POA: Diagnosis not present

## 2023-08-14 DIAGNOSIS — E8770 Fluid overload, unspecified: Secondary | ICD-10-CM | POA: Diagnosis not present

## 2023-08-14 DIAGNOSIS — Z7901 Long term (current) use of anticoagulants: Secondary | ICD-10-CM | POA: Diagnosis not present

## 2023-08-14 DIAGNOSIS — N186 End stage renal disease: Secondary | ICD-10-CM | POA: Insufficient documentation

## 2023-08-14 DIAGNOSIS — R079 Chest pain, unspecified: Secondary | ICD-10-CM | POA: Diagnosis not present

## 2023-08-14 DIAGNOSIS — D509 Iron deficiency anemia, unspecified: Secondary | ICD-10-CM | POA: Diagnosis not present

## 2023-08-14 DIAGNOSIS — I700 Atherosclerosis of aorta: Secondary | ICD-10-CM | POA: Diagnosis not present

## 2023-08-14 LAB — BASIC METABOLIC PANEL WITH GFR
Anion gap: 13 (ref 5–15)
BUN: 29 mg/dL — ABNORMAL HIGH (ref 8–23)
CO2: 27 mmol/L (ref 22–32)
Calcium: 9 mg/dL (ref 8.9–10.3)
Chloride: 96 mmol/L — ABNORMAL LOW (ref 98–111)
Creatinine, Ser: 5.75 mg/dL — ABNORMAL HIGH (ref 0.61–1.24)
GFR, Estimated: 9 mL/min — ABNORMAL LOW (ref >60)
Glucose, Bld: 93 mg/dL (ref 70–99)
Potassium: 3.3 mmol/L — ABNORMAL LOW (ref 3.5–5.1)
Sodium: 136 mmol/L (ref 135–145)

## 2023-08-14 LAB — TROPONIN I (HIGH SENSITIVITY)
Troponin I (High Sensitivity): 37 ng/L — ABNORMAL HIGH (ref <18)
Troponin I (High Sensitivity): 38 ng/L — ABNORMAL HIGH (ref <18)

## 2023-08-14 LAB — CBC WITH DIFFERENTIAL/PLATELET
Abs Immature Granulocytes: 0.03 K/uL (ref 0.00–0.07)
Basophils Absolute: 0.1 K/uL (ref 0.0–0.1)
Basophils Relative: 1 %
Eosinophils Absolute: 0.2 K/uL (ref 0.0–0.5)
Eosinophils Relative: 5 %
HCT: 34 % — ABNORMAL LOW (ref 39.0–52.0)
Hemoglobin: 11.2 g/dL — ABNORMAL LOW (ref 13.0–17.0)
Immature Granulocytes: 1 %
Lymphocytes Relative: 20 %
Lymphs Abs: 1 K/uL (ref 0.7–4.0)
MCH: 31.9 pg (ref 26.0–34.0)
MCHC: 32.9 g/dL (ref 30.0–36.0)
MCV: 96.9 fL (ref 80.0–100.0)
Monocytes Absolute: 0.4 K/uL (ref 0.1–1.0)
Monocytes Relative: 8 %
Neutro Abs: 3.3 K/uL (ref 1.7–7.7)
Neutrophils Relative %: 65 %
Platelets: 152 K/uL (ref 150–400)
RBC: 3.51 MIL/uL — ABNORMAL LOW (ref 4.22–5.81)
RDW: 15.2 % (ref 11.5–15.5)
WBC: 4.9 K/uL (ref 4.0–10.5)
nRBC: 0 % (ref 0.0–0.2)

## 2023-08-14 NOTE — ED Triage Notes (Signed)
 Pt arrived via [facility] c/o CP while getting home dialysis, completed 2 hours and 17 minutes and only had 47 minutes left of Tx to go but then started c/p CP mid sternal, dull in nature, pain 5/10, EMS states that it is reproducible with pain, also states this has happened before and he was Dx with dehydration, EMS gave 324mg  of aspirin en route

## 2023-08-14 NOTE — ED Notes (Signed)
L arm restricted due to fistula  ?

## 2023-08-14 NOTE — Discharge Instructions (Addendum)
 We evaluated you for your chest pain.  Your testing in the emergency department was reassuring.  Your cardiac enzymes are stable.  We do not think you need to stay in the hospital now especially since your symptoms have resolved.  Please follow-up closely with your cardiologist.  We have placed a referral for cardiology follow-up.  Please return if you have any new or worsening symptoms.

## 2023-08-14 NOTE — ED Provider Notes (Signed)
 [place] EMERGENCY DEPARTMENT AT [facility] Provider Note  CSN: [id number] Arrival date & time: 08/14/23 1432  Chief Complaint(s) No chief complaint on file.  HPI Glenn Olson is a 83 y.o. 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  Patient reports he was getting dialysis today when he developed chest pain in his substernal region.  Reports that it lasted for a brief period and is now completely resolved.  Reports that he felt some shortness of breath at the same time.  Denies any nausea, vomiting, lightheadedness or dizziness, syncope, back pain, pain in the arms or legs.  Denies any abdominal pain.  He gets dialysis at home.   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 Authorizing Provider  amiodarone  (PACERONE ) 200 MG tablet Take 1 tablet (200 mg total) by mouth daily. 11/16/22   Fenton, Clint R, PA  amoxicillin -clavulanate (AUGMENTIN ) 875-125 MG tablet Take 1 tablet by mouth  every 12 (twelve) hours. 06/28/23   Garrick Charleston, MD  apixaban  (ELIQUIS ) 2.5 MG TABS tablet Take 1 tablet by mouth twice daily 07/29/23   Fenton, Clint R, PA  atorvastatin (LIPITOR) 20 MG tablet Take 20 mg by mouth daily. 07/22/23   [provider]  calcitRIOL  (ROCALTROL ) 0.25 MCG capsule Take 0.25 mcg by mouth 3 (three) times a week. 06/20/23   [provider]  doxycycline  (VIBRAMYCIN ) 100 MG capsule Take 1 capsule (100 mg total) by mouth 2 (two) times daily. 06/28/23   Garrick Charleston, MD  fluticasone [facility]) 50 MCG/ACT nasal spray Place 2 sprays into both nostrils daily. 07/22/23   [provider]  levothyroxine  (SYNTHROID ) 88 MCG tablet Take 88 mcg by mouth daily. 06/21/23   [provider]  loratadine  (CLARITIN ) 10 MG tablet Take 1 tablet (10 mg total) by mouth daily. 06/29/22   Love, Sharlet RAMAN, PA-C  melatonin 3 MG TABS tablet Take 1 tablet (3 mg total) by mouth at bedtime as needed. Patient taking differently: Take 3 mg by mouth at bedtime as needed (Sleep). 06/28/22   Love, Sharlet RAMAN, PA-C  Methoxy PEG-Epoetin  Beta (MIRCERA IJ) Mircera 11/03/22 11/02/23  [provider]  metoprolol  succinate (TOPROL  XL) 25 MG 24 hr tablet Take 0.5 tablets (12.5 mg total) by mouth daily. 03/14/23   Nishan, Peter C, MD  oxyCODONE -acetaminophen  (PERCOCET) 5-325 MG tablet Take 1 tablet by mouth every 6 (six) hours as needed for severe pain (pain score 7-10). 01/11/23   Rhyne, Lucie PARAS, PA-C  VELPHORO 500 MG  chewable tablet Chew 500 mg by mouth 3 (three) times daily. 10/30/22   [provider]  vitamin D3 (CHOLECALCIFEROL ) 25 MCG tablet Take 1 tablet (1,000 Units total) by mouth daily. 06/29/22   Maurice Sharlet RAMAN, PA-C                                                                                                                                    Past Surgical History Past Surgical History:  Procedure Laterality Date   A/V SHUNT INTERVENTION Left 07/17/2023    Procedure: A/V SHUNT INTERVENTION;  Surgeon: Pearline Norman RAMAN, MD;  Location: [facility];  Service: Cardiovascular;  Laterality: Left;   AV FISTULA PLACEMENT Left 09/27/2020   Procedure: LEFT ARM ARTERIOVENOUS GRAFT PLACEMENT USING GORE-TEX 4-7MM X 45CM  VASCULAR GRAFT;  Surgeon: [name], MD;  Location: [facility];  Service: Vascular;  Laterality: Left;   AV FISTULA PLACEMENT Left 01/11/2023   Procedure: LEFT ARM ARTERIOVENOUS (AV) FISTULA CREATION;  Surgeon: [name] Penne Bruckner, MD;  Location: [facility] OR;  Service: Vascular;  Laterality: Left;   CAPD INSERTION N/A 03/17/2021   Procedure: ATTEMPTED INSERTION OF LAPAROSCOPIC CONTINUOUS AMBULATORY PERITONEAL DIALYSIS (CAPD) CATHETER;  Surgeon: [name] Penne Bruckner, MD;  Location: [facility] OR;  Service: Vascular;  Laterality: N/A;   CARDIOVERSION N/A 06/19/2022   Procedure: CARDIOVERSION;  Surgeon: [name], MD;  Location: [facility] INVASIVE CV LAB;  Service: Cardiovascular;  Laterality: N/A;   CYSTOURETHROSCOPY     INSERTION OF DIALYSIS CATHETER Right 09/27/2020   Procedure: INSERTION OF PALINDROME TUNNELED DIALYSIS CATHETER 14.71f X 23CM;  Surgeon: [name], MD;  Location: [facility];  Service: Vascular;  Laterality: Right;   INSERTION OF DIALYSIS CATHETER Right 05/31/2022   Procedure: INSERTION OF DIALYSIS CATHETER;  Surgeon: [name], MD;  Location: [facility] OR;  Service: Vascular;  Laterality: Right;   IR REMOVAL TUN CV CATH W/O FL  06/20/2022   REMOVAL OF A DIALYSIS CATHETER N/A 12/06/2020   Procedure: MINOR REMOVAL OF A DIALYSIS CATHETER;  Surgeon: [name], MD;  Location: [facility];  Service: Vascular;  Laterality: N/A;   SUPRAPUBIC CATHETER PLACEMENT     TEE WITHOUT CARDIOVERSION N/A 06/19/2022   Procedure: TRANSESOPHAGEAL ECHOCARDIOGRAM;  Surgeon: [name], MD;  Location: [facility] INVASIVE CV LAB;  Service: Cardiovascular;  Laterality: N/A;   VENOUS ANGIOPLASTY  07/17/2023   Procedure: VENOUS ANGIOPLASTY;  Surgeon: Pearline Norman RAMAN, MD;  Location: [facility];  Service: Cardiovascular;;   VENOUS STENT  07/17/2023   Procedure: VENOUS STENT;  Surgeon: Pearline Norman RAMAN, MD;  Location: [facility];  Service: Cardiovascular;;   Family History No family history on file.  Social History Social History   Tobacco Use   Smoking status: Never   Smokeless tobacco: Never   Tobacco comments:    Never smoked 09/26/22  Vaping Use   Vaping status: Never Used  Substance Use  Topics   Alcohol use: Yes    Comment: patient states he is a social drinker   Drug use: No   Allergies Patient has no known allergies.  Review of Systems Review of Systems  All other systems reviewed and are negative.   Physical Exam Vital Signs  I have reviewed the triage vital signs BP 122/68   Pulse 70   Temp 97.8 F (36.6 C) (Oral)   Resp 20   SpO2 96%  Physical Exam Vitals and nursing note reviewed.  Constitutional:      General: He is not in acute distress.    Appearance: Normal appearance.  HENT:     Mouth/Throat:     Mouth: Mucous membranes are moist.  Eyes:     Conjunctiva/sclera: Conjunctivae normal.  Cardiovascular:     Rate and Rhythm: Normal rate and regular rhythm.  Pulmonary:     Effort: Pulmonary effort is normal. No respiratory distress.     Breath sounds: Normal breath sounds.  Abdominal:     General: Abdomen is flat.     Palpations: Abdomen is soft.     Tenderness: There is no abdominal tenderness.  Musculoskeletal:     Right lower leg: No edema.     Left lower leg: No edema.  Skin:    General: Skin is warm and dry.     Capillary Refill: Capillary refill takes less than 2 seconds.  Neurological:     Mental Status: He is alert and oriented to person, place, and time. Mental status is at baseline.  Psychiatric:        Mood and Affect: Mood normal.        Behavior: Behavior normal.     ED Results and Treatments Labs (all labs ordered are listed, but only abnormal results are displayed) Labs Reviewed   BASIC METABOLIC PANEL WITH GFR - Abnormal; Notable for the following components:      Result Value   Potassium 3.3 (*)    Chloride 96 (*)    BUN 29 (*)    Creatinine, Ser 5.75 (*)    GFR, Estimated 9 (*)    All other components within normal limits  CBC WITH DIFFERENTIAL/PLATELET - Abnormal; Notable for the following components:   RBC 3.51 (*)    Hemoglobin 11.2 (*)    HCT 34.0 (*)    All other components within normal limits  TROPONIN I (HIGH SENSITIVITY) - Abnormal; Notable for the following components:   Troponin I (High Sensitivity) 37 (*)    All other components within normal limits  TROPONIN I (HIGH SENSITIVITY) - Abnormal; Notable for the following components:   Troponin I (High Sensitivity) 38 (*)    All other components within normal limits                                                                                                                          Radiology DG Chest Portable 1 View Result Date: 08/14/2023 CLINICAL DATA:  Chest pain EXAM: PORTABLE CHEST 1 VIEW COMPARISON:  X-ray 06/28/2023. older exams as well FINDINGS: Double-lumen right IJ catheter in place with tip at the SVC right atrial junction. Normal cardiopericardial silhouette. Calcified aorta. No consolidation, pneumothorax or effusion. No edema. Overlapping cardiac leads. Vascular stents along the subclavian/axillary region on the left side and along the left upper extremity. IMPRESSION: No acute cardiopulmonary disease. Double-lumen right IJ catheter.  Vascular stents. Electronically Signed   By: Ranell Bring M.D.   On: 08/14/2023 16:21    Pertinent labs & imaging results that were available during my care of the patient were reviewed by me and considered in my medical decision making (see MDM for details).  Medications Ordered in ED Medications - No data to display                                                                                                                                    Procedures Procedures  (including critical care time)  Medical Decision Making / ED Course   MDM:  83 year old presenting to the emergency department with episode of chest pain.  Patient well-appearing, vital signs reassuring.  EKG without signs of acute ischemia.  Will obtain further testing including troponin.  Differential includes ACS, musculoskeletal chest pain, effect of dialysis.  Considered other process such as dissection but lower concern with no radiation of the back, equal pulses, check chest x-ray.  Also includes pneumothorax or pneumonia, will check chest x-ray.  Considered pulmonary embolism however patient is chronically anticoagulated on Eliquis  and has no tachycardia or hypoxia.  Will get delta troponin given recent onset of symptoms.  Clinical Course as of 08/14/23 1833  Wed Aug 14, 2023  1831 Troponin I (High Sensitivity)(!): 38 Troponin is stable and at his baseline. CXR is negative. Patient continues to deny symptoms. Feel patient is stable for discharge. Will discharge patient to home. All questions answered. Patient comfortable with plan of discharge. Return precautions discussed with patient and specified on the after visit summary.  [WS]    Clinical Course User Index [WS] Francesca Elsie CROME, MD     Additional history obtained: -Additional history obtained from ems -External records from outside source obtained and reviewed including: Chart review including previous notes, labs, imaging, consultation notes including prior notes    Lab Tests: -I ordered, reviewed, and interpreted labs.   The pertinent results include:   Labs Reviewed  BASIC METABOLIC PANEL WITH GFR - Abnormal; Notable for the following components:      Result Value   Potassium 3.3 (*)    Chloride 96 (*)    BUN 29 (*)    Creatinine, Ser 5.75 (*)    GFR, Estimated 9 (*)    All other components within normal limits  CBC WITH DIFFERENTIAL/PLATELET - Abnormal; Notable for the  following components:   RBC 3.51 (*)    Hemoglobin 11.2 (*)    HCT 34.0 (*)  All other components within normal limits  TROPONIN I (HIGH SENSITIVITY) - Abnormal; Notable for the following components:   Troponin I (High Sensitivity) 37 (*)    All other components within normal limits  TROPONIN I (HIGH SENSITIVITY) - Abnormal; Notable for the following components:   Troponin I (High Sensitivity) 38 (*)    All other components within normal limits    Notable for stable troponin  EKG   EKG Interpretation Date/Time:  Wednesday August 14 2023 14:56:05 EDT Ventricular Rate:  69 PR Interval:  230 QRS Duration:  162 QT Interval:  511 QTC Calculation: 548 R Axis:   -81  Text Interpretation: Sinus rhythm Prolonged PR interval RBBB and LAFB Confirmed by Francesca Fallow ([id number]) on 08/14/2023 3:18:32 PM         Imaging Studies ordered: I ordered imaging studies including CXR On my interpretation imaging demonstrates no acute process I independently visualized and interpreted imaging. I agree with the radiologist interpretation   Medicines ordered and prescription drug management: No orders of the defined types were placed in this encounter.   -I have reviewed the patients home medicines and have made adjustments as needed   Reevaluation: After the interventions noted above, I reevaluated the patient and found that their symptoms have resolved  Co morbidities that complicate the patient evaluation  Past Medical History:  Diagnosis Date   Anemia    Cancer (HCC)    Chronic kidney disease    Dysrhythmia    PAF 05/30/22   GERD (gastroesophageal reflux disease)    Neurogenic bladder    Renal insufficiency       Dispostion: Disposition decision including need for hospitalization was considered, and patient discharged from emergency department.    Final Clinical Impression(s) / ED Diagnoses Final diagnoses:  Atypical chest pain     This chart was dictated using voice  recognition software.  Despite best efforts to proofread,  errors can occur which can change the documentation meaning.    Francesca Fallow CROME, MD 08/14/23 [id number]

## 2023-08-15 DIAGNOSIS — E8770 Fluid overload, unspecified: Secondary | ICD-10-CM | POA: Diagnosis not present

## 2023-08-15 DIAGNOSIS — E876 Hypokalemia: Secondary | ICD-10-CM | POA: Diagnosis not present

## 2023-08-15 DIAGNOSIS — D509 Iron deficiency anemia, unspecified: Secondary | ICD-10-CM | POA: Diagnosis not present

## 2023-08-15 DIAGNOSIS — D631 Anemia in chronic kidney disease: Secondary | ICD-10-CM | POA: Diagnosis not present

## 2023-08-15 DIAGNOSIS — Z992 Dependence on renal dialysis: Secondary | ICD-10-CM | POA: Diagnosis not present

## 2023-08-15 DIAGNOSIS — N186 End stage renal disease: Secondary | ICD-10-CM | POA: Diagnosis not present

## 2023-08-16 DIAGNOSIS — Z992 Dependence on renal dialysis: Secondary | ICD-10-CM | POA: Diagnosis not present

## 2023-08-16 DIAGNOSIS — D509 Iron deficiency anemia, unspecified: Secondary | ICD-10-CM | POA: Diagnosis not present

## 2023-08-16 DIAGNOSIS — N186 End stage renal disease: Secondary | ICD-10-CM | POA: Diagnosis not present

## 2023-08-16 DIAGNOSIS — E876 Hypokalemia: Secondary | ICD-10-CM | POA: Diagnosis not present

## 2023-08-16 DIAGNOSIS — E8770 Fluid overload, unspecified: Secondary | ICD-10-CM | POA: Diagnosis not present

## 2023-08-16 DIAGNOSIS — D631 Anemia in chronic kidney disease: Secondary | ICD-10-CM | POA: Diagnosis not present

## 2023-08-19 DIAGNOSIS — D631 Anemia in chronic kidney disease: Secondary | ICD-10-CM | POA: Diagnosis not present

## 2023-08-19 DIAGNOSIS — E876 Hypokalemia: Secondary | ICD-10-CM | POA: Diagnosis not present

## 2023-08-19 DIAGNOSIS — E8770 Fluid overload, unspecified: Secondary | ICD-10-CM | POA: Diagnosis not present

## 2023-08-19 DIAGNOSIS — N186 End stage renal disease: Secondary | ICD-10-CM | POA: Diagnosis not present

## 2023-08-19 DIAGNOSIS — Z992 Dependence on renal dialysis: Secondary | ICD-10-CM | POA: Diagnosis not present

## 2023-08-19 DIAGNOSIS — D509 Iron deficiency anemia, unspecified: Secondary | ICD-10-CM | POA: Diagnosis not present

## 2023-08-20 DIAGNOSIS — Z992 Dependence on renal dialysis: Secondary | ICD-10-CM | POA: Diagnosis not present

## 2023-08-20 DIAGNOSIS — D509 Iron deficiency anemia, unspecified: Secondary | ICD-10-CM | POA: Diagnosis not present

## 2023-08-20 DIAGNOSIS — E876 Hypokalemia: Secondary | ICD-10-CM | POA: Diagnosis not present

## 2023-08-20 DIAGNOSIS — N186 End stage renal disease: Secondary | ICD-10-CM | POA: Diagnosis not present

## 2023-08-20 DIAGNOSIS — E8770 Fluid overload, unspecified: Secondary | ICD-10-CM | POA: Diagnosis not present

## 2023-08-20 DIAGNOSIS — D631 Anemia in chronic kidney disease: Secondary | ICD-10-CM | POA: Diagnosis not present

## 2023-08-22 DIAGNOSIS — Z992 Dependence on renal dialysis: Secondary | ICD-10-CM | POA: Diagnosis not present

## 2023-08-22 DIAGNOSIS — D631 Anemia in chronic kidney disease: Secondary | ICD-10-CM | POA: Diagnosis not present

## 2023-08-22 DIAGNOSIS — E876 Hypokalemia: Secondary | ICD-10-CM | POA: Diagnosis not present

## 2023-08-22 DIAGNOSIS — D509 Iron deficiency anemia, unspecified: Secondary | ICD-10-CM | POA: Diagnosis not present

## 2023-08-22 DIAGNOSIS — N186 End stage renal disease: Secondary | ICD-10-CM | POA: Diagnosis not present

## 2023-08-22 DIAGNOSIS — E8770 Fluid overload, unspecified: Secondary | ICD-10-CM | POA: Diagnosis not present

## 2023-08-23 DIAGNOSIS — I10 Essential (primary) hypertension: Secondary | ICD-10-CM | POA: Diagnosis not present

## 2023-08-23 DIAGNOSIS — D631 Anemia in chronic kidney disease: Secondary | ICD-10-CM | POA: Diagnosis not present

## 2023-08-23 DIAGNOSIS — R413 Other amnesia: Secondary | ICD-10-CM | POA: Diagnosis not present

## 2023-08-23 DIAGNOSIS — Z992 Dependence on renal dialysis: Secondary | ICD-10-CM | POA: Diagnosis not present

## 2023-08-23 DIAGNOSIS — E8770 Fluid overload, unspecified: Secondary | ICD-10-CM | POA: Diagnosis not present

## 2023-08-23 DIAGNOSIS — K219 Gastro-esophageal reflux disease without esophagitis: Secondary | ICD-10-CM | POA: Diagnosis not present

## 2023-08-23 DIAGNOSIS — E876 Hypokalemia: Secondary | ICD-10-CM | POA: Diagnosis not present

## 2023-08-23 DIAGNOSIS — D509 Iron deficiency anemia, unspecified: Secondary | ICD-10-CM | POA: Diagnosis not present

## 2023-08-23 DIAGNOSIS — E039 Hypothyroidism, unspecified: Secondary | ICD-10-CM | POA: Diagnosis not present

## 2023-08-23 DIAGNOSIS — N312 Flaccid neuropathic bladder, not elsewhere classified: Secondary | ICD-10-CM | POA: Diagnosis not present

## 2023-08-23 DIAGNOSIS — I480 Paroxysmal atrial fibrillation: Secondary | ICD-10-CM | POA: Diagnosis not present

## 2023-08-23 DIAGNOSIS — N186 End stage renal disease: Secondary | ICD-10-CM | POA: Diagnosis not present

## 2023-08-23 DIAGNOSIS — J309 Allergic rhinitis, unspecified: Secondary | ICD-10-CM | POA: Diagnosis not present

## 2023-08-23 DIAGNOSIS — E782 Mixed hyperlipidemia: Secondary | ICD-10-CM | POA: Diagnosis not present

## 2023-08-23 DIAGNOSIS — N184 Chronic kidney disease, stage 4 (severe): Secondary | ICD-10-CM | POA: Diagnosis not present

## 2023-08-26 DIAGNOSIS — E8770 Fluid overload, unspecified: Secondary | ICD-10-CM | POA: Diagnosis not present

## 2023-08-26 DIAGNOSIS — Z992 Dependence on renal dialysis: Secondary | ICD-10-CM | POA: Diagnosis not present

## 2023-08-26 DIAGNOSIS — D631 Anemia in chronic kidney disease: Secondary | ICD-10-CM | POA: Diagnosis not present

## 2023-08-26 DIAGNOSIS — N186 End stage renal disease: Secondary | ICD-10-CM | POA: Diagnosis not present

## 2023-08-26 DIAGNOSIS — D509 Iron deficiency anemia, unspecified: Secondary | ICD-10-CM | POA: Diagnosis not present

## 2023-08-26 DIAGNOSIS — E876 Hypokalemia: Secondary | ICD-10-CM | POA: Diagnosis not present

## 2023-08-27 DIAGNOSIS — E876 Hypokalemia: Secondary | ICD-10-CM | POA: Diagnosis not present

## 2023-08-27 DIAGNOSIS — D631 Anemia in chronic kidney disease: Secondary | ICD-10-CM | POA: Diagnosis not present

## 2023-08-27 DIAGNOSIS — E8770 Fluid overload, unspecified: Secondary | ICD-10-CM | POA: Diagnosis not present

## 2023-08-27 DIAGNOSIS — D509 Iron deficiency anemia, unspecified: Secondary | ICD-10-CM | POA: Diagnosis not present

## 2023-08-27 DIAGNOSIS — N186 End stage renal disease: Secondary | ICD-10-CM | POA: Diagnosis not present

## 2023-08-27 DIAGNOSIS — Z992 Dependence on renal dialysis: Secondary | ICD-10-CM | POA: Diagnosis not present

## 2023-08-29 DIAGNOSIS — E8770 Fluid overload, unspecified: Secondary | ICD-10-CM | POA: Diagnosis not present

## 2023-08-29 DIAGNOSIS — D631 Anemia in chronic kidney disease: Secondary | ICD-10-CM | POA: Diagnosis not present

## 2023-08-29 DIAGNOSIS — E876 Hypokalemia: Secondary | ICD-10-CM | POA: Diagnosis not present

## 2023-08-29 DIAGNOSIS — E039 Hypothyroidism, unspecified: Secondary | ICD-10-CM | POA: Diagnosis not present

## 2023-08-29 DIAGNOSIS — E782 Mixed hyperlipidemia: Secondary | ICD-10-CM | POA: Diagnosis not present

## 2023-08-29 DIAGNOSIS — D509 Iron deficiency anemia, unspecified: Secondary | ICD-10-CM | POA: Diagnosis not present

## 2023-08-29 DIAGNOSIS — I10 Essential (primary) hypertension: Secondary | ICD-10-CM | POA: Diagnosis not present

## 2023-08-29 DIAGNOSIS — I480 Paroxysmal atrial fibrillation: Secondary | ICD-10-CM | POA: Diagnosis not present

## 2023-08-29 DIAGNOSIS — Z992 Dependence on renal dialysis: Secondary | ICD-10-CM | POA: Diagnosis not present

## 2023-08-29 DIAGNOSIS — N186 End stage renal disease: Secondary | ICD-10-CM | POA: Diagnosis not present

## 2023-08-30 DIAGNOSIS — N186 End stage renal disease: Secondary | ICD-10-CM | POA: Diagnosis not present

## 2023-08-30 DIAGNOSIS — N2581 Secondary hyperparathyroidism of renal origin: Secondary | ICD-10-CM | POA: Diagnosis not present

## 2023-08-30 DIAGNOSIS — E8770 Fluid overload, unspecified: Secondary | ICD-10-CM | POA: Diagnosis not present

## 2023-08-30 DIAGNOSIS — D631 Anemia in chronic kidney disease: Secondary | ICD-10-CM | POA: Diagnosis not present

## 2023-08-30 DIAGNOSIS — Z992 Dependence on renal dialysis: Secondary | ICD-10-CM | POA: Diagnosis not present

## 2023-08-30 DIAGNOSIS — I129 Hypertensive chronic kidney disease with stage 1 through stage 4 chronic kidney disease, or unspecified chronic kidney disease: Secondary | ICD-10-CM | POA: Diagnosis not present

## 2023-08-30 DIAGNOSIS — D509 Iron deficiency anemia, unspecified: Secondary | ICD-10-CM | POA: Diagnosis not present

## 2023-09-02 ENCOUNTER — Other Ambulatory Visit: Admitting: Urology

## 2023-09-02 DIAGNOSIS — D631 Anemia in chronic kidney disease: Secondary | ICD-10-CM | POA: Diagnosis not present

## 2023-09-02 DIAGNOSIS — E8770 Fluid overload, unspecified: Secondary | ICD-10-CM | POA: Diagnosis not present

## 2023-09-02 DIAGNOSIS — N186 End stage renal disease: Secondary | ICD-10-CM | POA: Diagnosis not present

## 2023-09-02 DIAGNOSIS — D509 Iron deficiency anemia, unspecified: Secondary | ICD-10-CM | POA: Diagnosis not present

## 2023-09-02 DIAGNOSIS — Z992 Dependence on renal dialysis: Secondary | ICD-10-CM | POA: Diagnosis not present

## 2023-09-03 DIAGNOSIS — D631 Anemia in chronic kidney disease: Secondary | ICD-10-CM | POA: Diagnosis not present

## 2023-09-03 DIAGNOSIS — D509 Iron deficiency anemia, unspecified: Secondary | ICD-10-CM | POA: Diagnosis not present

## 2023-09-03 DIAGNOSIS — Z992 Dependence on renal dialysis: Secondary | ICD-10-CM | POA: Diagnosis not present

## 2023-09-03 DIAGNOSIS — E8770 Fluid overload, unspecified: Secondary | ICD-10-CM | POA: Diagnosis not present

## 2023-09-03 DIAGNOSIS — N186 End stage renal disease: Secondary | ICD-10-CM | POA: Diagnosis not present

## 2023-09-04 DIAGNOSIS — N186 End stage renal disease: Secondary | ICD-10-CM | POA: Diagnosis not present

## 2023-09-04 DIAGNOSIS — Z992 Dependence on renal dialysis: Secondary | ICD-10-CM | POA: Diagnosis not present

## 2023-09-04 DIAGNOSIS — D509 Iron deficiency anemia, unspecified: Secondary | ICD-10-CM | POA: Diagnosis not present

## 2023-09-04 DIAGNOSIS — E8770 Fluid overload, unspecified: Secondary | ICD-10-CM | POA: Diagnosis not present

## 2023-09-04 DIAGNOSIS — D631 Anemia in chronic kidney disease: Secondary | ICD-10-CM | POA: Diagnosis not present

## 2023-09-05 DIAGNOSIS — D631 Anemia in chronic kidney disease: Secondary | ICD-10-CM | POA: Diagnosis not present

## 2023-09-05 DIAGNOSIS — N186 End stage renal disease: Secondary | ICD-10-CM | POA: Diagnosis not present

## 2023-09-05 DIAGNOSIS — Z992 Dependence on renal dialysis: Secondary | ICD-10-CM | POA: Diagnosis not present

## 2023-09-05 DIAGNOSIS — E8770 Fluid overload, unspecified: Secondary | ICD-10-CM | POA: Diagnosis not present

## 2023-09-05 DIAGNOSIS — D509 Iron deficiency anemia, unspecified: Secondary | ICD-10-CM | POA: Diagnosis not present

## 2023-09-06 DIAGNOSIS — D631 Anemia in chronic kidney disease: Secondary | ICD-10-CM | POA: Diagnosis not present

## 2023-09-06 DIAGNOSIS — D509 Iron deficiency anemia, unspecified: Secondary | ICD-10-CM | POA: Diagnosis not present

## 2023-09-06 DIAGNOSIS — N186 End stage renal disease: Secondary | ICD-10-CM | POA: Diagnosis not present

## 2023-09-06 DIAGNOSIS — E8770 Fluid overload, unspecified: Secondary | ICD-10-CM | POA: Diagnosis not present

## 2023-09-06 DIAGNOSIS — Z992 Dependence on renal dialysis: Secondary | ICD-10-CM | POA: Diagnosis not present

## 2023-09-07 DIAGNOSIS — E8770 Fluid overload, unspecified: Secondary | ICD-10-CM | POA: Diagnosis not present

## 2023-09-07 DIAGNOSIS — Z992 Dependence on renal dialysis: Secondary | ICD-10-CM | POA: Diagnosis not present

## 2023-09-07 DIAGNOSIS — N186 End stage renal disease: Secondary | ICD-10-CM | POA: Diagnosis not present

## 2023-09-07 DIAGNOSIS — D631 Anemia in chronic kidney disease: Secondary | ICD-10-CM | POA: Diagnosis not present

## 2023-09-07 DIAGNOSIS — D509 Iron deficiency anemia, unspecified: Secondary | ICD-10-CM | POA: Diagnosis not present

## 2023-09-09 DIAGNOSIS — E039 Hypothyroidism, unspecified: Secondary | ICD-10-CM | POA: Diagnosis not present

## 2023-09-09 DIAGNOSIS — E8770 Fluid overload, unspecified: Secondary | ICD-10-CM | POA: Diagnosis not present

## 2023-09-09 DIAGNOSIS — Z992 Dependence on renal dialysis: Secondary | ICD-10-CM | POA: Diagnosis not present

## 2023-09-09 DIAGNOSIS — D631 Anemia in chronic kidney disease: Secondary | ICD-10-CM | POA: Diagnosis not present

## 2023-09-09 DIAGNOSIS — N186 End stage renal disease: Secondary | ICD-10-CM | POA: Diagnosis not present

## 2023-09-09 DIAGNOSIS — D509 Iron deficiency anemia, unspecified: Secondary | ICD-10-CM | POA: Diagnosis not present

## 2023-09-10 DIAGNOSIS — E8770 Fluid overload, unspecified: Secondary | ICD-10-CM | POA: Diagnosis not present

## 2023-09-10 DIAGNOSIS — D631 Anemia in chronic kidney disease: Secondary | ICD-10-CM | POA: Diagnosis not present

## 2023-09-10 DIAGNOSIS — Z992 Dependence on renal dialysis: Secondary | ICD-10-CM | POA: Diagnosis not present

## 2023-09-10 DIAGNOSIS — D509 Iron deficiency anemia, unspecified: Secondary | ICD-10-CM | POA: Diagnosis not present

## 2023-09-10 DIAGNOSIS — N186 End stage renal disease: Secondary | ICD-10-CM | POA: Diagnosis not present

## 2023-09-11 DIAGNOSIS — N186 End stage renal disease: Secondary | ICD-10-CM | POA: Diagnosis not present

## 2023-09-11 DIAGNOSIS — Z992 Dependence on renal dialysis: Secondary | ICD-10-CM | POA: Diagnosis not present

## 2023-09-11 DIAGNOSIS — D631 Anemia in chronic kidney disease: Secondary | ICD-10-CM | POA: Diagnosis not present

## 2023-09-11 DIAGNOSIS — D509 Iron deficiency anemia, unspecified: Secondary | ICD-10-CM | POA: Diagnosis not present

## 2023-09-11 DIAGNOSIS — E8770 Fluid overload, unspecified: Secondary | ICD-10-CM | POA: Diagnosis not present

## 2023-09-12 DIAGNOSIS — E8770 Fluid overload, unspecified: Secondary | ICD-10-CM | POA: Diagnosis not present

## 2023-09-12 DIAGNOSIS — D631 Anemia in chronic kidney disease: Secondary | ICD-10-CM | POA: Diagnosis not present

## 2023-09-12 DIAGNOSIS — Z992 Dependence on renal dialysis: Secondary | ICD-10-CM | POA: Diagnosis not present

## 2023-09-12 DIAGNOSIS — N186 End stage renal disease: Secondary | ICD-10-CM | POA: Diagnosis not present

## 2023-09-12 DIAGNOSIS — D509 Iron deficiency anemia, unspecified: Secondary | ICD-10-CM | POA: Diagnosis not present

## 2023-09-13 DIAGNOSIS — Z992 Dependence on renal dialysis: Secondary | ICD-10-CM | POA: Diagnosis not present

## 2023-09-13 DIAGNOSIS — N186 End stage renal disease: Secondary | ICD-10-CM | POA: Diagnosis not present

## 2023-09-13 DIAGNOSIS — D631 Anemia in chronic kidney disease: Secondary | ICD-10-CM | POA: Diagnosis not present

## 2023-09-13 DIAGNOSIS — D509 Iron deficiency anemia, unspecified: Secondary | ICD-10-CM | POA: Diagnosis not present

## 2023-09-13 DIAGNOSIS — E8770 Fluid overload, unspecified: Secondary | ICD-10-CM | POA: Diagnosis not present

## 2023-09-14 NOTE — Progress Notes (Unsigned)
 Cardiology Office Note   Date:  09/16/2023  ID:  Nussen, Pullin Aug 25, 1940, MRN [id number] PCP: [name], MD  [facility] Providers Cardiologist:  [name], MD    History of Present Illness Glenn Olson is a 83 y.o. male with a past medical history of end-stage renal disease on home dialysis, A-fib on Eliquis  presenting to the office for follow-up visit.  Was recently seen in the ER for an episode of chest pain.  Patient reportedly was getting dialysis and then developed chest pain in his substernal region.  Reports that it lasted a brief period and resolved in the ED.  Reports that he felt some shortness of breath at the same time.  He denied any nausea, vomiting, lightheadedness or dizziness, syncope, back pain, pain in the arms or legs.  Denied any abdominal pain.  He gets dialysis at home.  Today, he presents with atrial fibrillation and on dialysis with atypical chest pain.  During dialysis, he experienced brief atypical chest pain in the middle of his chest, which resolved by the time he reached the emergency department. He had shortness of breath during the episode but no nausea or vomiting. Troponin levels were slightly elevated at 37-38. EKG showed a bundle branch block, consistent with a previous EKG. The chest x-ray was normal. No further episodes of chest discomfort have occurred.  He has atrial fibrillation and is on amiodarone  200 mg daily and metoprolol , half a pill once a day, taken after dialysis to avoid hypotension. He also takes Eliquis  without bleeding issues. A stress test was performed in January, and he previously used a heart monitor for rhythm evaluation.  He undergoes dialysis four days a week and currently uses an arm access point without issues. No swelling in his extremities, and his weight is stable at 178 pounds. He reports no recent heartburn, significant heart racing, or additional shortness of breath outside of the dialysis incident.  Blood pressure medications are adjusted around his dialysis schedule.  Reports no shortness of breath nor dyspnea on exertion. Reports no chest pain, pressure, or tightness. No edema, orthopnea, PND. Reports no palpitations.    Discussed the use of AI scribe software for clinical note transcription with the patient, who gave verbal consent to proceed.   ROS: Pertinent ROS in HPI  Studies Reviewed  02/12/2023 Narrative & Impression      Stress ECG is negative for ischemia and arrhythmias.   LV perfusion is abnormal. There is evidence of ischemia. There is no evidence of infarction. Defect 1: There is a small defect with mild reduction in uptake present in the apical to mid inferolateral location(s) that is reversible. There is normal wall motion in the defect area. Consistent with ischemia.   Left ventricular function is normal. Nuclear stress EF: 62%.   Findings are consistent with ischemia and no infarction. The study is low risk       Cardiac monitor 02/27/23 NSR Average HR 62 bpm No significant arrhythmias   [name] MD [facility]   Risk Assessment/Calculations  CHA2DS2-VASc Score = 2  This indicates a 2.2% annual risk of stroke. The patient's score is based upon: CHF History: 0 HTN History: 0 Diabetes History: 0 Stroke History: 0 Vascular Disease History: 0 Age Score: 2 Gender Score: 0       Physical Exam VS:  BP (!) 152/80 (BP Location: Left Arm, Patient Position: Sitting, Cuff Size: Normal)   Pulse 100   Ht 6' (1.829 m)   Wt  178 lb (80.7 kg)   SpO2 95%   BMI 24.14 kg/m        Wt Readings from Last 3 Encounters:  09/16/23 178 lb (80.7 kg)  06/28/23 178 lb 5.6 oz (80.9 kg)  04/10/23 178 lb 6.4 oz (80.9 kg)    GEN: Well nourished, well developed in no acute distress NECK: No JVD; No carotid bruits CARDIAC: IRIR, no murmurs, rubs, gallops RESPIRATORY:  Clear to auscultation without rales, wheezing or rhonchi  ABDOMEN: Soft, non-tender,  non-distended EXTREMITIES:  No edema; No deformity   ASSESSMENT AND PLAN  Atrial fibrillation Persistent atrial fibrillation, well-controlled with heart rate 90-100 bpm. No recent palpitations. On amiodarone  and metoprolol  for rate control. - Continue amiodarone  200 mg daily. - Continue metoprolol  half a pill once daily after dialysis. 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   Hypothyroidism Hypothyroidism managed with Synthroid . Recent high TSH led to increased Synthroid  dosage. - Continue Synthroid  100 mcg daily. - Follow up with Dr. Shona for thyroid  function tests.  Chest pain -  isolated event during dialysis, thought to be port related, mildly elevated troponin -Will defer stress test for now - Reviewed stress test from January of this year, low risk study - Patient is advised to let the office know if he has any further symptoms      Dispo: He can follow-up in 6 months  Signed, Orren LOISE Fabry, PA-C

## 2023-09-16 ENCOUNTER — Ambulatory Visit: Attending: Physician Assistant | Admitting: Physician Assistant

## 2023-09-16 VITALS — BP 152/80 | HR 100 | Ht 72.0 in | Wt 178.0 lb

## 2023-09-16 DIAGNOSIS — N184 Chronic kidney disease, stage 4 (severe): Secondary | ICD-10-CM | POA: Diagnosis not present

## 2023-09-16 DIAGNOSIS — I4819 Other persistent atrial fibrillation: Secondary | ICD-10-CM | POA: Diagnosis not present

## 2023-09-16 DIAGNOSIS — D6869 Other thrombophilia: Secondary | ICD-10-CM | POA: Insufficient documentation

## 2023-09-16 DIAGNOSIS — E8770 Fluid overload, unspecified: Secondary | ICD-10-CM | POA: Diagnosis not present

## 2023-09-16 DIAGNOSIS — I483 Typical atrial flutter: Secondary | ICD-10-CM | POA: Diagnosis not present

## 2023-09-16 DIAGNOSIS — D509 Iron deficiency anemia, unspecified: Secondary | ICD-10-CM | POA: Diagnosis not present

## 2023-09-16 DIAGNOSIS — Z992 Dependence on renal dialysis: Secondary | ICD-10-CM | POA: Diagnosis not present

## 2023-09-16 DIAGNOSIS — D631 Anemia in chronic kidney disease: Secondary | ICD-10-CM | POA: Diagnosis not present

## 2023-09-16 DIAGNOSIS — N186 End stage renal disease: Secondary | ICD-10-CM | POA: Diagnosis not present

## 2023-09-16 MED ORDER — ATORVASTATIN CALCIUM 20 MG PO TABS: 20.0000 mg | ORAL_TABLET | Freq: Every day | ORAL | 3 refills | Status: AC

## 2023-09-16 MED ORDER — METOPROLOL SUCCINATE ER 25 MG PO TB24: 12.5000 mg | ORAL_TABLET | Freq: Every day | ORAL | 11 refills | Status: DC

## 2023-09-16 NOTE — Patient Instructions (Signed)
 Medication Instructions:  Your physician recommends that you continue on your current medications as directed. Please refer to the Current Medication list given to you today.  *If you need a refill on your cardiac medications before your next appointment, please call your pharmacy*  Lab Work: NONE If you have labs (blood work) drawn today and your tests are completely normal, you will receive your results only by: MyChart Message (if you have MyChart) OR A paper copy in the mail If you have any lab test that is abnormal or we need to change your treatment, we will call you to review the results.  Testing/Procedures: NONE  Follow-Up: At [facility], you and your health needs are our priority.  As part of our continuing mission to provide you with exceptional heart care, our providers are all part of one team.  This team includes your primary Cardiologist (physician) and Advanced Practice Providers or APPs (Physician Assistants and Nurse Practitioners) who all work together to provide you with the care you need, when you need it.  Your next appointment:   6 month(s)  Provider:   [name], MD   We recommend signing up for the patient portal called MyChart.  Sign up information is provided on this After Visit Summary.  MyChart is used to connect with patients for Virtual Visits (Telemedicine).  Patients are able to view lab/test results, encounter notes, upcoming appointments, etc.  Non-urgent messages can be sent to your provider as well.   To learn more about what you can do with MyChart, go to [email or web address].   Other Instructions Please check your blood pressure 1-2 times per day for 2 weeks and send readings to [facility], PA-C.

## 2023-09-17 DIAGNOSIS — D509 Iron deficiency anemia, unspecified: Secondary | ICD-10-CM | POA: Diagnosis not present

## 2023-09-17 DIAGNOSIS — N186 End stage renal disease: Secondary | ICD-10-CM | POA: Diagnosis not present

## 2023-09-17 DIAGNOSIS — D631 Anemia in chronic kidney disease: Secondary | ICD-10-CM | POA: Diagnosis not present

## 2023-09-17 DIAGNOSIS — E8770 Fluid overload, unspecified: Secondary | ICD-10-CM | POA: Diagnosis not present

## 2023-09-17 DIAGNOSIS — Z992 Dependence on renal dialysis: Secondary | ICD-10-CM | POA: Diagnosis not present

## 2023-09-18 DIAGNOSIS — Z992 Dependence on renal dialysis: Secondary | ICD-10-CM | POA: Diagnosis not present

## 2023-09-18 DIAGNOSIS — D509 Iron deficiency anemia, unspecified: Secondary | ICD-10-CM | POA: Diagnosis not present

## 2023-09-18 DIAGNOSIS — N186 End stage renal disease: Secondary | ICD-10-CM | POA: Diagnosis not present

## 2023-09-18 DIAGNOSIS — D631 Anemia in chronic kidney disease: Secondary | ICD-10-CM | POA: Diagnosis not present

## 2023-09-18 DIAGNOSIS — E8770 Fluid overload, unspecified: Secondary | ICD-10-CM | POA: Diagnosis not present

## 2023-09-19 DIAGNOSIS — D631 Anemia in chronic kidney disease: Secondary | ICD-10-CM | POA: Diagnosis not present

## 2023-09-19 DIAGNOSIS — Z992 Dependence on renal dialysis: Secondary | ICD-10-CM | POA: Diagnosis not present

## 2023-09-19 DIAGNOSIS — E8770 Fluid overload, unspecified: Secondary | ICD-10-CM | POA: Diagnosis not present

## 2023-09-19 DIAGNOSIS — D509 Iron deficiency anemia, unspecified: Secondary | ICD-10-CM | POA: Diagnosis not present

## 2023-09-19 DIAGNOSIS — N186 End stage renal disease: Secondary | ICD-10-CM | POA: Diagnosis not present

## 2023-09-20 DIAGNOSIS — J309 Allergic rhinitis, unspecified: Secondary | ICD-10-CM | POA: Diagnosis not present

## 2023-09-20 DIAGNOSIS — D631 Anemia in chronic kidney disease: Secondary | ICD-10-CM | POA: Diagnosis not present

## 2023-09-20 DIAGNOSIS — Z992 Dependence on renal dialysis: Secondary | ICD-10-CM | POA: Diagnosis not present

## 2023-09-20 DIAGNOSIS — N184 Chronic kidney disease, stage 4 (severe): Secondary | ICD-10-CM | POA: Diagnosis not present

## 2023-09-20 DIAGNOSIS — I10 Essential (primary) hypertension: Secondary | ICD-10-CM | POA: Diagnosis not present

## 2023-09-20 DIAGNOSIS — E8770 Fluid overload, unspecified: Secondary | ICD-10-CM | POA: Diagnosis not present

## 2023-09-20 DIAGNOSIS — N312 Flaccid neuropathic bladder, not elsewhere classified: Secondary | ICD-10-CM | POA: Diagnosis not present

## 2023-09-20 DIAGNOSIS — K219 Gastro-esophageal reflux disease without esophagitis: Secondary | ICD-10-CM | POA: Diagnosis not present

## 2023-09-20 DIAGNOSIS — R413 Other amnesia: Secondary | ICD-10-CM | POA: Diagnosis not present

## 2023-09-20 DIAGNOSIS — E782 Mixed hyperlipidemia: Secondary | ICD-10-CM | POA: Diagnosis not present

## 2023-09-20 DIAGNOSIS — I480 Paroxysmal atrial fibrillation: Secondary | ICD-10-CM | POA: Diagnosis not present

## 2023-09-20 DIAGNOSIS — N186 End stage renal disease: Secondary | ICD-10-CM | POA: Diagnosis not present

## 2023-09-20 DIAGNOSIS — D509 Iron deficiency anemia, unspecified: Secondary | ICD-10-CM | POA: Diagnosis not present

## 2023-09-20 DIAGNOSIS — E039 Hypothyroidism, unspecified: Secondary | ICD-10-CM | POA: Diagnosis not present

## 2023-09-23 ENCOUNTER — Ambulatory Visit (HOSPITAL_COMMUNITY): Payer: Medicare Other | Admitting: Physician Assistant

## 2023-09-23 DIAGNOSIS — Z992 Dependence on renal dialysis: Secondary | ICD-10-CM | POA: Diagnosis not present

## 2023-09-23 DIAGNOSIS — D509 Iron deficiency anemia, unspecified: Secondary | ICD-10-CM | POA: Diagnosis not present

## 2023-09-23 DIAGNOSIS — N186 End stage renal disease: Secondary | ICD-10-CM | POA: Diagnosis not present

## 2023-09-23 DIAGNOSIS — D631 Anemia in chronic kidney disease: Secondary | ICD-10-CM | POA: Diagnosis not present

## 2023-09-23 DIAGNOSIS — E8770 Fluid overload, unspecified: Secondary | ICD-10-CM | POA: Diagnosis not present

## 2023-09-24 DIAGNOSIS — E8770 Fluid overload, unspecified: Secondary | ICD-10-CM | POA: Diagnosis not present

## 2023-09-24 DIAGNOSIS — Z992 Dependence on renal dialysis: Secondary | ICD-10-CM | POA: Diagnosis not present

## 2023-09-24 DIAGNOSIS — D509 Iron deficiency anemia, unspecified: Secondary | ICD-10-CM | POA: Diagnosis not present

## 2023-09-24 DIAGNOSIS — N186 End stage renal disease: Secondary | ICD-10-CM | POA: Diagnosis not present

## 2023-09-24 DIAGNOSIS — D631 Anemia in chronic kidney disease: Secondary | ICD-10-CM | POA: Diagnosis not present

## 2023-09-26 ENCOUNTER — Other Ambulatory Visit (HOSPITAL_COMMUNITY): Payer: Self-pay | Admitting: Physician Assistant

## 2023-09-26 DIAGNOSIS — N186 End stage renal disease: Secondary | ICD-10-CM | POA: Diagnosis not present

## 2023-09-26 DIAGNOSIS — D631 Anemia in chronic kidney disease: Secondary | ICD-10-CM | POA: Diagnosis not present

## 2023-09-26 DIAGNOSIS — Z992 Dependence on renal dialysis: Secondary | ICD-10-CM | POA: Diagnosis not present

## 2023-09-26 DIAGNOSIS — D509 Iron deficiency anemia, unspecified: Secondary | ICD-10-CM | POA: Diagnosis not present

## 2023-09-26 DIAGNOSIS — E8770 Fluid overload, unspecified: Secondary | ICD-10-CM | POA: Diagnosis not present

## 2023-09-27 DIAGNOSIS — N184 Chronic kidney disease, stage 4 (severe): Secondary | ICD-10-CM | POA: Diagnosis not present

## 2023-09-27 DIAGNOSIS — Z992 Dependence on renal dialysis: Secondary | ICD-10-CM | POA: Diagnosis not present

## 2023-09-27 DIAGNOSIS — E782 Mixed hyperlipidemia: Secondary | ICD-10-CM | POA: Diagnosis not present

## 2023-09-27 DIAGNOSIS — D509 Iron deficiency anemia, unspecified: Secondary | ICD-10-CM | POA: Diagnosis not present

## 2023-09-27 DIAGNOSIS — I10 Essential (primary) hypertension: Secondary | ICD-10-CM | POA: Diagnosis not present

## 2023-09-27 DIAGNOSIS — K219 Gastro-esophageal reflux disease without esophagitis: Secondary | ICD-10-CM | POA: Diagnosis not present

## 2023-09-27 DIAGNOSIS — E8770 Fluid overload, unspecified: Secondary | ICD-10-CM | POA: Diagnosis not present

## 2023-09-27 DIAGNOSIS — D631 Anemia in chronic kidney disease: Secondary | ICD-10-CM | POA: Diagnosis not present

## 2023-09-27 DIAGNOSIS — N186 End stage renal disease: Secondary | ICD-10-CM | POA: Diagnosis not present

## 2023-09-30 DIAGNOSIS — I129 Hypertensive chronic kidney disease with stage 1 through stage 4 chronic kidney disease, or unspecified chronic kidney disease: Secondary | ICD-10-CM | POA: Diagnosis not present

## 2023-09-30 DIAGNOSIS — D631 Anemia in chronic kidney disease: Secondary | ICD-10-CM | POA: Diagnosis not present

## 2023-09-30 DIAGNOSIS — N2581 Secondary hyperparathyroidism of renal origin: Secondary | ICD-10-CM | POA: Diagnosis not present

## 2023-09-30 DIAGNOSIS — Z992 Dependence on renal dialysis: Secondary | ICD-10-CM | POA: Diagnosis not present

## 2023-09-30 DIAGNOSIS — D509 Iron deficiency anemia, unspecified: Secondary | ICD-10-CM | POA: Diagnosis not present

## 2023-09-30 DIAGNOSIS — N186 End stage renal disease: Secondary | ICD-10-CM | POA: Diagnosis not present

## 2023-09-30 DIAGNOSIS — E8770 Fluid overload, unspecified: Secondary | ICD-10-CM | POA: Diagnosis not present

## 2023-10-02 DIAGNOSIS — D631 Anemia in chronic kidney disease: Secondary | ICD-10-CM | POA: Diagnosis not present

## 2023-10-02 DIAGNOSIS — N186 End stage renal disease: Secondary | ICD-10-CM | POA: Diagnosis not present

## 2023-10-02 DIAGNOSIS — D509 Iron deficiency anemia, unspecified: Secondary | ICD-10-CM | POA: Diagnosis not present

## 2023-10-02 DIAGNOSIS — E8770 Fluid overload, unspecified: Secondary | ICD-10-CM | POA: Diagnosis not present

## 2023-10-02 DIAGNOSIS — Z992 Dependence on renal dialysis: Secondary | ICD-10-CM | POA: Diagnosis not present

## 2023-10-03 DIAGNOSIS — E039 Hypothyroidism, unspecified: Secondary | ICD-10-CM | POA: Diagnosis not present

## 2023-10-03 DIAGNOSIS — E8770 Fluid overload, unspecified: Secondary | ICD-10-CM | POA: Diagnosis not present

## 2023-10-03 DIAGNOSIS — D631 Anemia in chronic kidney disease: Secondary | ICD-10-CM | POA: Diagnosis not present

## 2023-10-03 DIAGNOSIS — N186 End stage renal disease: Secondary | ICD-10-CM | POA: Diagnosis not present

## 2023-10-03 DIAGNOSIS — D509 Iron deficiency anemia, unspecified: Secondary | ICD-10-CM | POA: Diagnosis not present

## 2023-10-03 DIAGNOSIS — Z992 Dependence on renal dialysis: Secondary | ICD-10-CM | POA: Diagnosis not present

## 2023-10-04 ENCOUNTER — Telehealth: Payer: Self-pay | Admitting: Urology

## 2023-10-04 ENCOUNTER — Encounter (HOSPITAL_COMMUNITY): Payer: Self-pay | Admitting: Emergency Medicine

## 2023-10-04 ENCOUNTER — Emergency Department (HOSPITAL_COMMUNITY)
Admission: EM | Admit: 2023-10-04 | Discharge: 2023-10-04 | Disposition: A | Discharge status: Home / Self Care | Attending: Emergency Medicine | Admitting: Emergency Medicine

## 2023-10-04 ENCOUNTER — Other Ambulatory Visit: Payer: Self-pay

## 2023-10-04 DIAGNOSIS — Z7901 Long term (current) use of anticoagulants: Secondary | ICD-10-CM | POA: Diagnosis not present

## 2023-10-04 DIAGNOSIS — N3001 Acute cystitis with hematuria: Secondary | ICD-10-CM | POA: Diagnosis not present

## 2023-10-04 DIAGNOSIS — B9689 Other specified bacterial agents as the cause of diseases classified elsewhere: Secondary | ICD-10-CM | POA: Diagnosis not present

## 2023-10-04 DIAGNOSIS — T83091A Other mechanical complication of indwelling urethral catheter, initial encounter: Secondary | ICD-10-CM | POA: Diagnosis not present

## 2023-10-04 DIAGNOSIS — Y732 Prosthetic and other implants, materials and accessory gastroenterology and urology devices associated with adverse incidents: Secondary | ICD-10-CM | POA: Insufficient documentation

## 2023-10-04 DIAGNOSIS — T83010A Breakdown (mechanical) of cystostomy catheter, initial encounter: Secondary | ICD-10-CM

## 2023-10-04 DIAGNOSIS — N390 Urinary tract infection, site not specified: Secondary | ICD-10-CM | POA: Diagnosis not present

## 2023-10-04 DIAGNOSIS — T83098A Other mechanical complication of other indwelling urethral catheter, initial encounter: Secondary | ICD-10-CM | POA: Insufficient documentation

## 2023-10-04 LAB — URINALYSIS, ROUTINE W REFLEX MICROSCOPIC
Bilirubin Urine: NEGATIVE
Glucose, UA: NEGATIVE mg/dL
Ketones, ur: NEGATIVE mg/dL
Nitrite: NEGATIVE
Protein, ur: 100 mg/dL — AB
Specific Gravity, Urine: 1.006 (ref 1.005–1.030)
WBC, UA: >50 WBC/hpf (ref 0–5)
pH: 8 (ref 5.0–8.0)

## 2023-10-04 MED ORDER — CEPHALEXIN 500 MG PO CAPS: 500.0000 mg | ORAL_CAPSULE | Freq: Two times a day (BID) | ORAL | 0 refills | Status: AC

## 2023-10-04 MED ORDER — CEPHALEXIN 500 MG PO CAPS
500.0000 mg | ORAL_CAPSULE | Freq: Once | ORAL | Status: AC
  Administered 2023-10-04: 500 mg via ORAL
  Filled 2023-10-04: qty 1

## 2023-10-04 NOTE — ED Notes (Signed)
 Patient Alert and oriented to baseline. Stable and ambulatory to baseline. Patient verbalized understanding of the discharge instructions.  Patient belongings were taken by the patient.

## 2023-10-04 NOTE — Discharge Instructions (Addendum)
 Please follow-up closely with your primary care doctor and urologist on an outpatient basis.  Take all antibiotics as directed.  Return to emergency department immediately for any new or worsening symptoms.

## 2023-10-04 NOTE — Telephone Encounter (Signed)
 Patient wife called , she states that the patient SP Cath came out last night.  I advised her that Dr Nieves was not in the office today, she is going to take him to the ER after his dialysis appointment today.  Just an FYI

## 2023-10-04 NOTE — Telephone Encounter (Signed)
 Pt states he's urinating a little bit through his penis but pt states sometimes he feels like he has to go but either way only a little is coming out

## 2023-10-04 NOTE — ED Provider Notes (Signed)
 [place] EMERGENCY DEPARTMENT AT [facility] Provider Note   CSN: [id number] Arrival date & time: 10/04/23  0845     Patient presents with: Supra pubic Catheter Probelm   Glenn Olson is a 83 y.o. male.   Patient is a 83 year old male who presents to the emergency department stating that his suprapubic catheter came out throughout the night.  Patient notes that he did place the catheter back in to his suprapubic site at that point.  He did reach out to his urologist who recommended he come to the emergency department this morning.  Patient admits to some suprapubic discomfort but denies any other associated pain at this time.  He denies any associated fever or chills.  He denies any associated abdominal pain, nausea, vomiting, diarrhea.        Prior to Admission medications   Medication Sig Start Date End Date Taking? Authorizing Provider  amiodarone  (PACERONE ) 200 MG tablet TAKE 1 TABLET DAILY 09/26/23   Fenton, Clint R, PA  amoxicillin -clavulanate (AUGMENTIN ) 875-125 MG tablet Take 1 tablet by mouth every 12 (twelve) hours. Patient not taking: Reported on 09/16/2023 06/28/23   Garrick Charleston, MD  apixaban  (ELIQUIS ) 2.5 MG TABS tablet Take 1 tablet by mouth twice daily 07/29/23   Fenton, Clint R, PA  atorvastatin  (LIPITOR) 20 MG tablet Take 1 tablet (20 mg total) by mouth daily. 09/16/23   Lucien Orren SAILOR, PA-C  calcitRIOL  (ROCALTROL ) 0.25 MCG capsule Take 0.25 mcg by mouth 3 (three) times a week. 06/20/23   [provider]  doxycycline  (VIBRAMYCIN ) 100 MG capsule Take 1 capsule (100 mg total) by mouth 2 (two) times daily. 06/28/23   Garrick Charleston, MD  fluticasone [facility]) 50 MCG/ACT nasal spray Place 2 sprays into both nostrils daily. 07/22/23   [provider]  levothyroxine  (SYNTHROID ) 88 MCG tablet Take 88 mcg by mouth daily. 06/21/23   [provider]  loratadine  (CLARITIN ) 10 MG tablet Take 1 tablet (10 mg total) by mouth daily. 06/29/22    Love, Sharlet RAMAN, PA-C  melatonin 3 MG TABS tablet Take 1 tablet (3 mg total) by mouth at bedtime as needed. Patient taking differently: Take 3 mg by mouth at bedtime as needed (Sleep). 06/28/22   Love, Sharlet RAMAN, PA-C  Methoxy PEG-Epoetin  Beta (MIRCERA IJ) Mircera 11/03/22 11/02/23  [provider]  metoprolol  succinate (TOPROL  XL) 25 MG 24 hr tablet Take 0.5 tablets (12.5 mg total) by mouth daily. 09/16/23   Conte, Tessa N, PA-C  oxyCODONE -acetaminophen  (PERCOCET) 5-325 MG tablet Take 1 tablet by mouth every 6 (six) hours as needed for severe pain (pain score 7-10). 01/11/23   Rhyne, Samantha J, PA-C  VELPHORO 500 MG chewable tablet Chew 500 mg by mouth 3 (three) times daily. 10/30/22   [provider]  vitamin D3 (CHOLECALCIFEROL ) 25 MCG tablet Take 1 tablet (1,000 Units total) by mouth daily. 06/29/22   Maurice Sharlet RAMAN, PA-C    Allergies: Patient has no known allergies.    Review of Systems  Genitourinary:        Suprapubic catheter dislodged    Updated Vital Signs BP (!) 145/74   Pulse 72   Temp 97.6 F (36.4 C) (Oral)   Ht 6' (1.829 m)   Wt 80 kg   SpO2 100%   BMI 23.92 kg/m   Physical Exam Vitals and nursing note reviewed. Exam conducted with a chaperone present.  Constitutional:      Appearance: Normal appearance.  HENT:  Head: Normocephalic and atraumatic.     Mouth/Throat:     Mouth: Mucous membranes are moist.  Eyes:     Extraocular Movements: Extraocular movements intact.     Conjunctiva/sclera: Conjunctivae normal.     Pupils: Pupils are equal, round, and reactive to light.  Cardiovascular:     Rate and Rhythm: Normal rate and regular rhythm.     Pulses: Normal pulses.     Heart sounds: Normal heart sounds.  Pulmonary:     Effort: Pulmonary effort is normal.     Breath sounds: Normal breath sounds.  Abdominal:     General: Abdomen is flat. Bowel sounds are normal. There is no distension.     Palpations: Abdomen is soft.     Tenderness: There  is no abdominal tenderness. There is no guarding.  Genitourinary:    Penis: Normal.      Testes: Normal.     Comments: Suprapubic catheter site with no surrounding erythema, discharge Musculoskeletal:        General: Normal range of motion.  Skin:    General: Skin is warm and dry.  Neurological:     General: No focal deficit present.     Mental Status: He is alert and oriented to person, place, and time. Mental status is at baseline.  Psychiatric:        Mood and Affect: Mood normal.        Behavior: Behavior normal.        Thought Content: Thought content normal.        Judgment: Judgment normal.     (all labs ordered are listed, but only abnormal results are displayed) Labs Reviewed  URINE CULTURE  URINALYSIS, ROUTINE W REFLEX MICROSCOPIC    EKG: None  Radiology: No results found.   SUPRAPUBIC TUBE PLACEMENT  Date/Time: 10/04/2023 9:42 AM  Performed by: Daralene Lonni BIRCH, PA-C Authorized by: Daralene Lonni BIRCH, PA-C   Consent:    Consent obtained:  Verbal   Consent given by:  Patient   Risks discussed:  Bleeding, pain and infection   Alternatives discussed:  No treatment Universal protocol:    Procedure explained and questions answered to patient or proxy's satisfaction: yes     Site/side marked: yes     Immediately prior to procedure, a time out was called: yes     Patient identity confirmed:  Verbally with patient, arm band, provided demographic data and hospital-assigned identification number Sedation:    Sedation type:  None Anesthesia:    Anesthesia method:  None Procedure details:    Complexity:  Simple   Catheter type:  Foley   Catheter size:  16 Fr   Ultrasound guidance: no     Number of attempts:  1   Urine characteristics:  Cloudy Post-procedure details:    Procedure completion:  Tolerated well, no immediate complications    Medications Ordered in the ED - No data to display                                  Medical Decision  Making Amount and/or Complexity of Data Reviewed Labs: ordered.  Risk Prescription drug management.   This patient presents to the ED for concern of suprapubic catheter malfunction differential diagnosis includes suprapubic catheter replacement, urinary tract infection    Additional history obtained:  Additional history obtained from family External records from outside source obtained and reviewed including medical records   Lab  Tests:  I Ordered, and personally interpreted labs.  The pertinent results include: Urinalysis with large leukocytes and moderate hemoglobin    Medicines ordered and prescription drug management:  I ordered medication including Keflex  for UTI Reevaluation of the patient after these medicines showed that the patient improved I have reviewed the patients home medicines and have made adjustments as needed   Problem List / ED Course:  Patient is doing well at this time and is stable for discharge home.  Discussed with patient that urinalysis is concerning for urinary tract infection.  Will cover accordingly with Keflex  on an outpatient basis.  This has been renally dosed to 2 times a day at this time.  Patient has stable vital signs with no indication for sepsis.  Patient notes that he has returned back to his baseline.  After suprapubic catheter replacement good urine return was obtained and do not suspect that any other imaging is warranted.  Close follow-up with PCP and urologist was discussed.  Strict turn precautions were provided for any new or worsening symptoms.  Patient voiced understanding and had no additional questions.   Social Determinants of Health:  None        Final diagnoses:  None    ED Discharge Orders     None          Daralene Lonni JONETTA DEVONNA 10/04/23 1031    Suzette Pac, MD 10/05/23 1705

## 2023-10-04 NOTE — Telephone Encounter (Signed)
 Just to clarify if he is making urine he may not need the SP tube correct ?

## 2023-10-04 NOTE — Telephone Encounter (Signed)
 FYI

## 2023-10-04 NOTE — ED Triage Notes (Signed)
 Pt states his supra pubic catheter came out in the middle of the night. Pt states the balloon was deflated so he placed it back in. Urology sent him here for a cath exchange. Pt denies any pain.

## 2023-10-04 NOTE — Telephone Encounter (Signed)
 Error

## 2023-10-05 DIAGNOSIS — E8770 Fluid overload, unspecified: Secondary | ICD-10-CM | POA: Diagnosis not present

## 2023-10-05 DIAGNOSIS — Z992 Dependence on renal dialysis: Secondary | ICD-10-CM | POA: Diagnosis not present

## 2023-10-05 DIAGNOSIS — D509 Iron deficiency anemia, unspecified: Secondary | ICD-10-CM | POA: Diagnosis not present

## 2023-10-05 DIAGNOSIS — D631 Anemia in chronic kidney disease: Secondary | ICD-10-CM | POA: Diagnosis not present

## 2023-10-05 DIAGNOSIS — N186 End stage renal disease: Secondary | ICD-10-CM | POA: Diagnosis not present

## 2023-10-07 DIAGNOSIS — E8770 Fluid overload, unspecified: Secondary | ICD-10-CM | POA: Diagnosis not present

## 2023-10-07 DIAGNOSIS — N186 End stage renal disease: Secondary | ICD-10-CM | POA: Diagnosis not present

## 2023-10-07 DIAGNOSIS — Z992 Dependence on renal dialysis: Secondary | ICD-10-CM | POA: Diagnosis not present

## 2023-10-07 DIAGNOSIS — D509 Iron deficiency anemia, unspecified: Secondary | ICD-10-CM | POA: Diagnosis not present

## 2023-10-07 DIAGNOSIS — D631 Anemia in chronic kidney disease: Secondary | ICD-10-CM | POA: Diagnosis not present

## 2023-10-07 LAB — URINE CULTURE: Culture: 70000 — AB

## 2023-10-08 ENCOUNTER — Telehealth (HOSPITAL_BASED_OUTPATIENT_CLINIC_OR_DEPARTMENT_OTHER): Payer: Self-pay | Admitting: *Deleted

## 2023-10-08 DIAGNOSIS — E8770 Fluid overload, unspecified: Secondary | ICD-10-CM | POA: Diagnosis not present

## 2023-10-08 DIAGNOSIS — Z992 Dependence on renal dialysis: Secondary | ICD-10-CM | POA: Diagnosis not present

## 2023-10-08 DIAGNOSIS — D509 Iron deficiency anemia, unspecified: Secondary | ICD-10-CM | POA: Diagnosis not present

## 2023-10-08 DIAGNOSIS — N186 End stage renal disease: Secondary | ICD-10-CM | POA: Diagnosis not present

## 2023-10-08 DIAGNOSIS — D631 Anemia in chronic kidney disease: Secondary | ICD-10-CM | POA: Diagnosis not present

## 2023-10-08 NOTE — Progress Notes (Addendum)
 Pharmacy Note  Glenn Olson is a 83 y.o. male admitted on 10/04/2023 for suprapubic catheter replacement.  His catheter came out overnight 9/4. There was no surrounding erythema or discharge. He denied any urinary symptoms.   Ucx: 70k CFU Pseudomonas Aeruginosa  Assessment: Pseudomonas colonization  Plan: Stop Keflex . No further antibiotics needed.   Thank you for allowing pharmacy to be a part of this patient's care.  Troyce Febo 10/08/2023 8:25 AM

## 2023-10-08 NOTE — Telephone Encounter (Signed)
 Post ED Visit - Positive Culture Follow-up  Culture report reviewed by antimicrobial stewardship pharmacist: [facility] [x]  Elma Fail Pharm.D. []  Venetia Gully, Pharm.D., BCPS AQ-ID []  Garrel Crews, Pharm.D., BCPS []  Almarie Lunger, Pharm.D., BCPS []  [place], [address].D., BCPS, AAHIVP []  Rosaline Bihari, Pharm.D., BCPS, AAHIVP []  Vernell Meier, PharmD, BCPS []  Latanya Hint, PharmD, BCPS []  Donald Medley, PharmD, BCPS []  Rocky Bold, PharmD []  Dorothyann Alert, PharmD, BCPS []  Morene Babe, PharmD  [facility] []  Rosaline Edison, PharmD []  Romona Bliss, PharmD []  Dolphus Roller, PharmD []  Veva Seip, Rph []  Vernell Daunt) Leonce, PharmD []  Eva Allis, PharmD []  Rosaline Millet, PharmD []  Iantha Batch, PharmD []  Arvin Gauss, PharmD []  Wanda Hasting, PharmD []  Ronal Rav, PharmD []  Rocky Slade, PharmD []  Bard Jeans, PharmD  Treated with cephalexin  Positive urine culture reviewed by [place] PA-C. Instructed patient to stop antibiotics and no further patient follow-up is required at this time.  Jama Lisle Blondie 10/08/2023, 9:25 AM

## 2023-10-09 ENCOUNTER — Ambulatory Visit (HOSPITAL_COMMUNITY): Admitting: Physician Assistant

## 2023-10-10 DIAGNOSIS — N186 End stage renal disease: Secondary | ICD-10-CM | POA: Diagnosis not present

## 2023-10-10 DIAGNOSIS — D631 Anemia in chronic kidney disease: Secondary | ICD-10-CM | POA: Diagnosis not present

## 2023-10-10 DIAGNOSIS — D509 Iron deficiency anemia, unspecified: Secondary | ICD-10-CM | POA: Diagnosis not present

## 2023-10-10 DIAGNOSIS — Z992 Dependence on renal dialysis: Secondary | ICD-10-CM | POA: Diagnosis not present

## 2023-10-10 DIAGNOSIS — E8770 Fluid overload, unspecified: Secondary | ICD-10-CM | POA: Diagnosis not present

## 2023-10-11 DIAGNOSIS — E8770 Fluid overload, unspecified: Secondary | ICD-10-CM | POA: Diagnosis not present

## 2023-10-11 DIAGNOSIS — N186 End stage renal disease: Secondary | ICD-10-CM | POA: Diagnosis not present

## 2023-10-11 DIAGNOSIS — D509 Iron deficiency anemia, unspecified: Secondary | ICD-10-CM | POA: Diagnosis not present

## 2023-10-11 DIAGNOSIS — D631 Anemia in chronic kidney disease: Secondary | ICD-10-CM | POA: Diagnosis not present

## 2023-10-11 DIAGNOSIS — Z992 Dependence on renal dialysis: Secondary | ICD-10-CM | POA: Diagnosis not present

## 2023-10-14 ENCOUNTER — Ambulatory Visit: Admitting: Urology

## 2023-10-14 VITALS — BP 172/85 | HR 70

## 2023-10-14 DIAGNOSIS — N312 Flaccid neuropathic bladder, not elsewhere classified: Secondary | ICD-10-CM

## 2023-10-14 DIAGNOSIS — N319 Neuromuscular dysfunction of bladder, unspecified: Secondary | ICD-10-CM | POA: Diagnosis not present

## 2023-10-14 MED ORDER — CIPROFLOXACIN HCL 500 MG PO TABS
500.0000 mg | ORAL_TABLET | Freq: Once | ORAL | Status: AC
  Administered 2023-10-14: 500 mg via ORAL

## 2023-10-14 MED ORDER — CEPHALEXIN 500 MG PO CAPS: 500.0000 mg | ORAL_CAPSULE | Freq: Once | ORAL | Status: DC

## 2023-10-14 NOTE — Progress Notes (Unsigned)
  [place]  10/14/23  CC: No chief complaint on file.   HPI: F/u -   1) 1) neurogenic bladder-patient underwent a suprapubic tube in 2010 with Dr. Nicholaus.  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  He reports he does not make urine anymore.  He caps the SP tube and does not recall the last time he needed to uncap the drainage.  Denies any voiding or leakage per urethra. No hematuria. CT Jun 2024  - no stones. BWT and BPH. Right tic.   Today, seen for the above. He was to f/u with SP tube output log but never did. The SP tube fell out and he went to ED where it was replaced 10/04/2023. He was covered with cephalexin . His urine cx grew pseudomonas. Cysto today benign and he had a good bit of urine in the bladder. I filled him up with about 100 ml and then drained 360 ml of fluid meaning he had about 250 ml of urine in his bladder. He now tells me he does drain the tube after breakfast and lunch daily and gets some urine.    He was a pipe fitter. 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    Blood pressure (!) 172/85, pulse 70. NED. A&Ox3.   No respiratory distress   Abd soft, NT, ND Normal phallus with bilateral descended testicles  Cystoscopy Procedure Note  Patient identification was confirmed, informed consent was obtained, and patient was prepped using Betadine solution.  Lidocaine  jelly was administered per urethral meatus.     Pre-Procedure: - Inspection reveals a normal caliber ureteral meatus.  Procedure: The flexible cystoscope was introduced without difficulty - No urethral strictures/lesions are present. - prostate borderline obstructive  - normal bladder neck - Bilateral ureteral orifices identified - Bladder mucosa  reveals no ulcers, tumors, or lesions - No bladder stones - moderate trabeculation, cellule -SP tube in place with balloon and tip visualized - mild erythema surrounding.   Retroflexion shows normal bladder, bladder  neck   Post-Procedure: - Patient tolerated the procedure well  Assessment/ Plan:  NG bladder - benign cystoscopy. On further exam and history he appears to be making urine and still requires the SP tube.   Glenn Brooks, MD

## 2023-10-15 DIAGNOSIS — D631 Anemia in chronic kidney disease: Secondary | ICD-10-CM | POA: Diagnosis not present

## 2023-10-15 DIAGNOSIS — D509 Iron deficiency anemia, unspecified: Secondary | ICD-10-CM | POA: Diagnosis not present

## 2023-10-15 DIAGNOSIS — N186 End stage renal disease: Secondary | ICD-10-CM | POA: Diagnosis not present

## 2023-10-15 DIAGNOSIS — Z992 Dependence on renal dialysis: Secondary | ICD-10-CM | POA: Diagnosis not present

## 2023-10-15 DIAGNOSIS — E8770 Fluid overload, unspecified: Secondary | ICD-10-CM | POA: Diagnosis not present

## 2023-10-16 DIAGNOSIS — E782 Mixed hyperlipidemia: Secondary | ICD-10-CM | POA: Diagnosis not present

## 2023-10-16 DIAGNOSIS — E039 Hypothyroidism, unspecified: Secondary | ICD-10-CM | POA: Diagnosis not present

## 2023-10-16 DIAGNOSIS — I10 Essential (primary) hypertension: Secondary | ICD-10-CM | POA: Diagnosis not present

## 2023-10-16 DIAGNOSIS — D638 Anemia in other chronic diseases classified elsewhere: Secondary | ICD-10-CM | POA: Diagnosis not present

## 2023-10-17 DIAGNOSIS — N186 End stage renal disease: Secondary | ICD-10-CM | POA: Diagnosis not present

## 2023-10-17 DIAGNOSIS — D631 Anemia in chronic kidney disease: Secondary | ICD-10-CM | POA: Diagnosis not present

## 2023-10-17 DIAGNOSIS — E8770 Fluid overload, unspecified: Secondary | ICD-10-CM | POA: Diagnosis not present

## 2023-10-17 DIAGNOSIS — Z992 Dependence on renal dialysis: Secondary | ICD-10-CM | POA: Diagnosis not present

## 2023-10-17 DIAGNOSIS — D509 Iron deficiency anemia, unspecified: Secondary | ICD-10-CM | POA: Diagnosis not present

## 2023-10-18 DIAGNOSIS — E8770 Fluid overload, unspecified: Secondary | ICD-10-CM | POA: Diagnosis not present

## 2023-10-18 DIAGNOSIS — N186 End stage renal disease: Secondary | ICD-10-CM | POA: Diagnosis not present

## 2023-10-18 DIAGNOSIS — D631 Anemia in chronic kidney disease: Secondary | ICD-10-CM | POA: Diagnosis not present

## 2023-10-18 DIAGNOSIS — Z992 Dependence on renal dialysis: Secondary | ICD-10-CM | POA: Diagnosis not present

## 2023-10-18 DIAGNOSIS — D509 Iron deficiency anemia, unspecified: Secondary | ICD-10-CM | POA: Diagnosis not present

## 2023-10-19 LAB — LAB REPORT - SCANNED
EGFR: 6
Free T4: 1.3 ng/dL
TSH: 8.5 — AB (ref 0.41–5.90)

## 2023-10-22 DIAGNOSIS — D631 Anemia in chronic kidney disease: Secondary | ICD-10-CM | POA: Diagnosis not present

## 2023-10-22 DIAGNOSIS — D509 Iron deficiency anemia, unspecified: Secondary | ICD-10-CM | POA: Diagnosis not present

## 2023-10-22 DIAGNOSIS — E8770 Fluid overload, unspecified: Secondary | ICD-10-CM | POA: Diagnosis not present

## 2023-10-22 DIAGNOSIS — Z992 Dependence on renal dialysis: Secondary | ICD-10-CM | POA: Diagnosis not present

## 2023-10-22 DIAGNOSIS — N186 End stage renal disease: Secondary | ICD-10-CM | POA: Diagnosis not present

## 2023-10-23 DIAGNOSIS — E782 Mixed hyperlipidemia: Secondary | ICD-10-CM | POA: Diagnosis not present

## 2023-10-23 DIAGNOSIS — I480 Paroxysmal atrial fibrillation: Secondary | ICD-10-CM | POA: Diagnosis not present

## 2023-10-23 DIAGNOSIS — D696 Thrombocytopenia, unspecified: Secondary | ICD-10-CM | POA: Diagnosis not present

## 2023-10-23 DIAGNOSIS — I10 Essential (primary) hypertension: Secondary | ICD-10-CM | POA: Diagnosis not present

## 2023-10-23 DIAGNOSIS — D631 Anemia in chronic kidney disease: Secondary | ICD-10-CM | POA: Diagnosis not present

## 2023-10-23 DIAGNOSIS — G4700 Insomnia, unspecified: Secondary | ICD-10-CM | POA: Diagnosis not present

## 2023-10-23 DIAGNOSIS — Z992 Dependence on renal dialysis: Secondary | ICD-10-CM | POA: Diagnosis not present

## 2023-10-23 DIAGNOSIS — J309 Allergic rhinitis, unspecified: Secondary | ICD-10-CM | POA: Diagnosis not present

## 2023-10-23 DIAGNOSIS — N185 Chronic kidney disease, stage 5: Secondary | ICD-10-CM | POA: Diagnosis not present

## 2023-10-23 DIAGNOSIS — E039 Hypothyroidism, unspecified: Secondary | ICD-10-CM | POA: Diagnosis not present

## 2023-10-23 DIAGNOSIS — Z23 Encounter for immunization: Secondary | ICD-10-CM | POA: Diagnosis not present

## 2023-10-23 DIAGNOSIS — I120 Hypertensive chronic kidney disease with stage 5 chronic kidney disease or end stage renal disease: Secondary | ICD-10-CM | POA: Diagnosis not present

## 2023-10-24 ENCOUNTER — Ambulatory Visit (HOSPITAL_COMMUNITY)
Admission: RE | Admit: 2023-10-24 | Discharge: 2023-10-24 | Disposition: A | Discharge status: Home / Self Care | Source: Ambulatory Visit | Attending: Physician Assistant | Admitting: Physician Assistant

## 2023-10-24 VITALS — BP 142/70 | HR 72 | Ht 72.0 in | Wt 171.8 lb

## 2023-10-24 DIAGNOSIS — Z79899 Other long term (current) drug therapy: Secondary | ICD-10-CM | POA: Diagnosis not present

## 2023-10-24 DIAGNOSIS — E8770 Fluid overload, unspecified: Secondary | ICD-10-CM | POA: Diagnosis not present

## 2023-10-24 DIAGNOSIS — D6869 Other thrombophilia: Secondary | ICD-10-CM | POA: Insufficient documentation

## 2023-10-24 DIAGNOSIS — Z5181 Encounter for therapeutic drug level monitoring: Secondary | ICD-10-CM | POA: Diagnosis not present

## 2023-10-24 DIAGNOSIS — I483 Typical atrial flutter: Secondary | ICD-10-CM | POA: Insufficient documentation

## 2023-10-24 DIAGNOSIS — I4819 Other persistent atrial fibrillation: Secondary | ICD-10-CM | POA: Diagnosis not present

## 2023-10-24 DIAGNOSIS — I4891 Unspecified atrial fibrillation: Secondary | ICD-10-CM | POA: Diagnosis not present

## 2023-10-24 DIAGNOSIS — N186 End stage renal disease: Secondary | ICD-10-CM | POA: Diagnosis not present

## 2023-10-24 DIAGNOSIS — D631 Anemia in chronic kidney disease: Secondary | ICD-10-CM | POA: Diagnosis not present

## 2023-10-24 DIAGNOSIS — Z992 Dependence on renal dialysis: Secondary | ICD-10-CM | POA: Diagnosis not present

## 2023-10-24 DIAGNOSIS — R011 Cardiac murmur, unspecified: Secondary | ICD-10-CM | POA: Diagnosis not present

## 2023-10-24 DIAGNOSIS — D509 Iron deficiency anemia, unspecified: Secondary | ICD-10-CM | POA: Diagnosis not present

## 2023-10-24 NOTE — Addendum Note (Signed)
 Encounter addended by: Franchot Glade RAMAN, RN on: 10/24/2023 10:39 AM  Actions taken: Visit diagnoses modified, Order list changed, Diagnosis association updated

## 2023-10-24 NOTE — Progress Notes (Signed)
 Primary Care Physician: [name], MD Primary Cardiologist: [name]  Primary Electrophysiologist: Dr Nancey Referring Physician: Dr Glenn Olson is a 83 y.o. male with a history of ESRD, anemia, atrial flutter, atrial fibrillation who presents for follow up in the [facility]. Patient initially presented to the emergency room on 05/30/2022 due to complaints of headache, n/v, poor oral intake and chest pain x 1 week.  He was admitted for uremia and metabolic acidosis secondary to stopping dialysis over a year ago and cessation of taking all of his medications. During admission, patient was found to be in A-fib RVR. Per telemetry/EKG review, he was in atrial flutter with RVR upon admission, briefly converted to NSR on 5/5, before returning to atrial flutter then developing atrial fib overnight, rates 120s-150s. He has had several electrolyte derangements. Hgb was 6.7. Rate control was attempted with diltiazem  but he continued to have issues with elevated rates and he underwent TEE guided DCCV on 06/19/22. Patient is on Eliquis  for stroke prevention.   Patient seen at the ED 07/11/22 for an issue with his suprapubic catheter being embedded in bladder wall. He was also in atrial flutter with elevated rates and underwent DCCV. Patient was seen at the ED twice in December 2024 for afib. He was started on diltiazem  by Dr Delford but had issues with hypotension post HD. He was transitioned to Lopressor .   Patient returns for follow up for atrial fibrillation and amiodarone  monitoring. He reports that he has done well since his last visit from a cardiac standpoint. He remains in SR today. No bleeding issues on anticoagulation.   Today, he  denies symptoms of palpitations, chest pain, shortness of breath, orthopnea, PND, lower extremity edema, dizziness, presyncope, syncope, snoring, daytime somnolence, bleeding, or neurologic sequela. The patient is tolerating  medications without difficulties and is otherwise without complaint today.    Atrial Fibrillation Risk Factors:  he does not have symptoms or diagnosis of sleep apnea. he does not have a history of rheumatic fever.   Atrial Fibrillation Management history:  Previous antiarrhythmic drugs: amiodarone   Previous cardioversions: 06/19/22, 07/12/22 Previous ablations: none Anticoagulation history: Eliquis    Past Medical History:  Diagnosis Date   Anemia    Cancer (HCC)    Chronic kidney disease    Dysrhythmia    PAF 05/30/22   GERD (gastroesophageal reflux disease)    Neurogenic bladder    Renal insufficiency     Current Outpatient Medications  Medication Sig Dispense Refill   amiodarone  (PACERONE ) 200 MG tablet TAKE 1 TABLET DAILY 90 tablet 2   apixaban  (ELIQUIS ) 2.5 MG TABS tablet Take 1 tablet by mouth twice daily 180 tablet 1   atorvastatin  (LIPITOR) 20 MG tablet Take 1 tablet (20 mg total) by mouth daily. 90 tablet 3   calcitRIOL  (ROCALTROL ) 0.25 MCG capsule Take 0.25 mcg by mouth 3 (three) times a week.     fluticasone (FLONASE) 50 MCG/ACT nasal spray Place 2 sprays into both nostrils daily.     Levothyroxine  Sodium 112 MCG CAPS Take 1 capsule by mouth every morning.     lidocaine -prilocaine  (EMLA ) cream Apply 1 Application topically as needed.     loratadine  (CLARITIN ) 10 MG tablet Take 1 tablet (10 mg total) by mouth daily. 30 tablet 0   melatonin 3 MG TABS tablet Take 1 tablet (3 mg total) by mouth at bedtime as needed. 30 tablet 0   Methoxy PEG-Epoetin  Beta (MIRCERA IJ) Mircera  metoprolol  succinate (TOPROL  XL) 25 MG 24 hr tablet Take 0.5 tablets (12.5 mg total) by mouth daily. 15 tablet 11   VELPHORO 500 MG chewable tablet Chew 500 mg by mouth 3 (three) times daily.     No current facility-administered medications for this encounter.    ROS- All systems are reviewed and negative except as per the HPI above.  Physical Exam: Vitals:   10/24/23 0944  BP: (!)  142/70  Pulse: 72  Weight: 77.9 kg  Height: 6' (1.829 m)     GEN: Well nourished, well developed in no acute distress CARDIAC: Regular rate and rhythm with occasional ectopy, no rubs, gallops. 3/6 systolic murmur  RESPIRATORY:  Clear to auscultation without rales, wheezing or rhonchi  ABDOMEN: Soft, non-tender, non-distended EXTREMITIES:  No edema; No deformity    Wt Readings from Last 3 Encounters:  10/24/23 77.9 kg  10/04/23 80 kg  09/16/23 80.7 kg    EKG today demonstrates  SR, 1st degree AV block, RBBB, LAFB, PAC Vent. rate 72 BPM PR interval 228 ms QRS duration 162 ms QT/QTcB 518/567 ms   Echo 06/05/22 demonstrated  1. Left ventricular ejection fraction, by estimation, is 55 to 60%. The  left ventricle has normal function. The left ventricle has no regional  wall motion abnormalities. There is mild concentric left ventricular  hypertrophy. Left ventricular diastolic function could not be evaluated.   2. Right ventricular systolic function is normal. The right ventricular  size is normal. There is mildly elevated pulmonary artery systolic  pressure. The estimated right ventricular systolic pressure is 44.2 mmHg.   3. Left atrial size was moderately dilated.   4. Right atrial size was mildly dilated.   5. The mitral valve is grossly normal. Trivial mitral valve  regurgitation. No evidence of mitral stenosis.   6. The aortic valve is tricuspid. Aortic valve regurgitation is not  visualized. No aortic stenosis is present.   7. The inferior vena cava is dilated in size with <50% respiratory  variability, suggesting right atrial pressure of 15 mmHg.   Epic records are reviewed at length today.   CHA2DS2-VASc Score = 2  The patient's score is based upon: CHF History: 0 HTN History: 0 Diabetes History: 0 Stroke History: 0 Vascular Disease History: 0 Age Score: 2 Gender Score: 0       ASSESSMENT AND PLAN: Persistent Atrial Fibrillation/atrial flutter (ICD10:   I48.19) The patient's CHA2DS2-VASc score is 2, indicating a 2.2% annual risk of stroke.   Patient appears to be maintaining SR Continue amiodarone  200 mg daily Continue Toprol  12.5 mg daily Continue Eliquis  2.5 mg BID  Secondary Hypercoagulable State (ICD10:  D68.69) The patient is at significant risk for stroke/thromboembolism based upon his CHA2DS2-VASc Score of 2.  Continue Apixaban  (Eliquis ). No bleeding issues.   High Risk Medication Monitoring (ICD 10: J342684) Patient requires ongoing monitoring for anti-arrhythmic medication which has the potential to cause life threatening arrhythmias. Intervals on ECG acceptable for amiodarone  monitoring. Recent labs from PCP reviewed.    ESRD HD on Tu/Th/Sat  VHD Mild MR on TEE 06/19/22 On physical exam today, a change in intensity of murmur noted. Will order echo to evaluate.     Follow up in the AF clinic in 6 months.    Glenn Kicks PA-C [facility] [address] [place], [place] [address] [phone] 10/24/2023 10:12 AM

## 2023-10-24 NOTE — Patient Instructions (Signed)
 Echocardiogram -- scheduling will call once insurance authorization received.

## 2023-10-26 DIAGNOSIS — Z992 Dependence on renal dialysis: Secondary | ICD-10-CM | POA: Diagnosis not present

## 2023-10-26 DIAGNOSIS — D509 Iron deficiency anemia, unspecified: Secondary | ICD-10-CM | POA: Diagnosis not present

## 2023-10-26 DIAGNOSIS — E8770 Fluid overload, unspecified: Secondary | ICD-10-CM | POA: Diagnosis not present

## 2023-10-26 DIAGNOSIS — D631 Anemia in chronic kidney disease: Secondary | ICD-10-CM | POA: Diagnosis not present

## 2023-10-26 DIAGNOSIS — N186 End stage renal disease: Secondary | ICD-10-CM | POA: Diagnosis not present

## 2023-10-29 DIAGNOSIS — I10 Essential (primary) hypertension: Secondary | ICD-10-CM | POA: Diagnosis not present

## 2023-10-29 DIAGNOSIS — D509 Iron deficiency anemia, unspecified: Secondary | ICD-10-CM | POA: Diagnosis not present

## 2023-10-29 DIAGNOSIS — N184 Chronic kidney disease, stage 4 (severe): Secondary | ICD-10-CM | POA: Diagnosis not present

## 2023-10-29 DIAGNOSIS — E782 Mixed hyperlipidemia: Secondary | ICD-10-CM | POA: Diagnosis not present

## 2023-10-29 DIAGNOSIS — Z992 Dependence on renal dialysis: Secondary | ICD-10-CM | POA: Diagnosis not present

## 2023-10-29 DIAGNOSIS — K219 Gastro-esophageal reflux disease without esophagitis: Secondary | ICD-10-CM | POA: Diagnosis not present

## 2023-10-29 DIAGNOSIS — D631 Anemia in chronic kidney disease: Secondary | ICD-10-CM | POA: Diagnosis not present

## 2023-10-29 DIAGNOSIS — N186 End stage renal disease: Secondary | ICD-10-CM | POA: Diagnosis not present

## 2023-10-29 DIAGNOSIS — E8770 Fluid overload, unspecified: Secondary | ICD-10-CM | POA: Diagnosis not present

## 2023-10-30 DIAGNOSIS — N186 End stage renal disease: Secondary | ICD-10-CM | POA: Diagnosis not present

## 2023-10-30 DIAGNOSIS — Z992 Dependence on renal dialysis: Secondary | ICD-10-CM | POA: Diagnosis not present

## 2023-10-30 DIAGNOSIS — I129 Hypertensive chronic kidney disease with stage 1 through stage 4 chronic kidney disease, or unspecified chronic kidney disease: Secondary | ICD-10-CM | POA: Diagnosis not present

## 2023-10-31 DIAGNOSIS — E8770 Fluid overload, unspecified: Secondary | ICD-10-CM | POA: Diagnosis not present

## 2023-10-31 DIAGNOSIS — D509 Iron deficiency anemia, unspecified: Secondary | ICD-10-CM | POA: Diagnosis not present

## 2023-10-31 DIAGNOSIS — N186 End stage renal disease: Secondary | ICD-10-CM | POA: Diagnosis not present

## 2023-10-31 DIAGNOSIS — Z992 Dependence on renal dialysis: Secondary | ICD-10-CM | POA: Diagnosis not present

## 2023-10-31 DIAGNOSIS — N2581 Secondary hyperparathyroidism of renal origin: Secondary | ICD-10-CM | POA: Diagnosis not present

## 2023-10-31 DIAGNOSIS — D631 Anemia in chronic kidney disease: Secondary | ICD-10-CM | POA: Diagnosis not present

## 2023-10-31 DIAGNOSIS — E039 Hypothyroidism, unspecified: Secondary | ICD-10-CM | POA: Diagnosis not present

## 2023-11-01 DIAGNOSIS — N186 End stage renal disease: Secondary | ICD-10-CM | POA: Diagnosis not present

## 2023-11-01 DIAGNOSIS — D631 Anemia in chronic kidney disease: Secondary | ICD-10-CM | POA: Diagnosis not present

## 2023-11-01 DIAGNOSIS — D509 Iron deficiency anemia, unspecified: Secondary | ICD-10-CM | POA: Diagnosis not present

## 2023-11-01 DIAGNOSIS — Z992 Dependence on renal dialysis: Secondary | ICD-10-CM | POA: Diagnosis not present

## 2023-11-01 DIAGNOSIS — E8770 Fluid overload, unspecified: Secondary | ICD-10-CM | POA: Diagnosis not present

## 2023-11-02 DIAGNOSIS — D631 Anemia in chronic kidney disease: Secondary | ICD-10-CM | POA: Diagnosis not present

## 2023-11-02 DIAGNOSIS — E8770 Fluid overload, unspecified: Secondary | ICD-10-CM | POA: Diagnosis not present

## 2023-11-02 DIAGNOSIS — Z992 Dependence on renal dialysis: Secondary | ICD-10-CM | POA: Diagnosis not present

## 2023-11-02 DIAGNOSIS — D509 Iron deficiency anemia, unspecified: Secondary | ICD-10-CM | POA: Diagnosis not present

## 2023-11-02 DIAGNOSIS — N186 End stage renal disease: Secondary | ICD-10-CM | POA: Diagnosis not present

## 2023-11-05 DIAGNOSIS — D509 Iron deficiency anemia, unspecified: Secondary | ICD-10-CM | POA: Diagnosis not present

## 2023-11-05 DIAGNOSIS — D631 Anemia in chronic kidney disease: Secondary | ICD-10-CM | POA: Diagnosis not present

## 2023-11-05 DIAGNOSIS — Z992 Dependence on renal dialysis: Secondary | ICD-10-CM | POA: Diagnosis not present

## 2023-11-05 DIAGNOSIS — N186 End stage renal disease: Secondary | ICD-10-CM | POA: Diagnosis not present

## 2023-11-05 DIAGNOSIS — E8770 Fluid overload, unspecified: Secondary | ICD-10-CM | POA: Diagnosis not present

## 2023-11-06 DIAGNOSIS — D509 Iron deficiency anemia, unspecified: Secondary | ICD-10-CM | POA: Diagnosis not present

## 2023-11-06 DIAGNOSIS — Z992 Dependence on renal dialysis: Secondary | ICD-10-CM | POA: Diagnosis not present

## 2023-11-06 DIAGNOSIS — D631 Anemia in chronic kidney disease: Secondary | ICD-10-CM | POA: Diagnosis not present

## 2023-11-06 DIAGNOSIS — N186 End stage renal disease: Secondary | ICD-10-CM | POA: Diagnosis not present

## 2023-11-06 DIAGNOSIS — E8770 Fluid overload, unspecified: Secondary | ICD-10-CM | POA: Diagnosis not present

## 2023-11-07 DIAGNOSIS — N186 End stage renal disease: Secondary | ICD-10-CM | POA: Diagnosis not present

## 2023-11-07 DIAGNOSIS — Z992 Dependence on renal dialysis: Secondary | ICD-10-CM | POA: Diagnosis not present

## 2023-11-07 DIAGNOSIS — E8770 Fluid overload, unspecified: Secondary | ICD-10-CM | POA: Diagnosis not present

## 2023-11-07 DIAGNOSIS — D631 Anemia in chronic kidney disease: Secondary | ICD-10-CM | POA: Diagnosis not present

## 2023-11-07 DIAGNOSIS — D509 Iron deficiency anemia, unspecified: Secondary | ICD-10-CM | POA: Diagnosis not present

## 2023-11-10 DIAGNOSIS — D509 Iron deficiency anemia, unspecified: Secondary | ICD-10-CM | POA: Diagnosis not present

## 2023-11-10 DIAGNOSIS — Z992 Dependence on renal dialysis: Secondary | ICD-10-CM | POA: Diagnosis not present

## 2023-11-10 DIAGNOSIS — D631 Anemia in chronic kidney disease: Secondary | ICD-10-CM | POA: Diagnosis not present

## 2023-11-10 DIAGNOSIS — E8770 Fluid overload, unspecified: Secondary | ICD-10-CM | POA: Diagnosis not present

## 2023-11-10 DIAGNOSIS — N186 End stage renal disease: Secondary | ICD-10-CM | POA: Diagnosis not present

## 2023-11-12 ENCOUNTER — Other Ambulatory Visit (HOSPITAL_COMMUNITY): Payer: Self-pay | Admitting: Nephrology

## 2023-11-12 ENCOUNTER — Telehealth (HOSPITAL_COMMUNITY): Payer: Self-pay | Admitting: Radiology

## 2023-11-12 DIAGNOSIS — N186 End stage renal disease: Secondary | ICD-10-CM

## 2023-11-12 NOTE — Telephone Encounter (Signed)
 Called Ronal Rias to schedule CVC removal for Mr. Madl. Left VM for her to call back. JM

## 2023-11-13 ENCOUNTER — Ambulatory Visit

## 2023-11-14 DIAGNOSIS — N2581 Secondary hyperparathyroidism of renal origin: Secondary | ICD-10-CM | POA: Diagnosis not present

## 2023-11-14 DIAGNOSIS — Z992 Dependence on renal dialysis: Secondary | ICD-10-CM | POA: Diagnosis not present

## 2023-11-14 DIAGNOSIS — N186 End stage renal disease: Secondary | ICD-10-CM | POA: Diagnosis not present

## 2023-11-14 DIAGNOSIS — E039 Hypothyroidism, unspecified: Secondary | ICD-10-CM | POA: Diagnosis not present

## 2023-11-14 DIAGNOSIS — D631 Anemia in chronic kidney disease: Secondary | ICD-10-CM | POA: Diagnosis not present

## 2023-11-16 DIAGNOSIS — D631 Anemia in chronic kidney disease: Secondary | ICD-10-CM | POA: Diagnosis not present

## 2023-11-16 DIAGNOSIS — N186 End stage renal disease: Secondary | ICD-10-CM | POA: Diagnosis not present

## 2023-11-16 DIAGNOSIS — N2581 Secondary hyperparathyroidism of renal origin: Secondary | ICD-10-CM | POA: Diagnosis not present

## 2023-11-16 DIAGNOSIS — Z992 Dependence on renal dialysis: Secondary | ICD-10-CM | POA: Diagnosis not present

## 2023-11-19 DIAGNOSIS — N186 End stage renal disease: Secondary | ICD-10-CM | POA: Diagnosis not present

## 2023-11-19 DIAGNOSIS — Z992 Dependence on renal dialysis: Secondary | ICD-10-CM | POA: Diagnosis not present

## 2023-11-19 DIAGNOSIS — D631 Anemia in chronic kidney disease: Secondary | ICD-10-CM | POA: Diagnosis not present

## 2023-11-19 DIAGNOSIS — N2581 Secondary hyperparathyroidism of renal origin: Secondary | ICD-10-CM | POA: Diagnosis not present

## 2023-11-21 ENCOUNTER — Ambulatory Visit (HOSPITAL_COMMUNITY)
Admission: RE | Admit: 2023-11-21 | Discharge: 2023-11-21 | Disposition: A | Discharge status: Home / Self Care | Source: Ambulatory Visit | Attending: Family Medicine | Admitting: Family Medicine

## 2023-11-21 ENCOUNTER — Other Ambulatory Visit (HOSPITAL_COMMUNITY): Payer: Self-pay | Admitting: Family Medicine

## 2023-11-21 DIAGNOSIS — J811 Chronic pulmonary edema: Secondary | ICD-10-CM | POA: Diagnosis not present

## 2023-11-21 DIAGNOSIS — Z992 Dependence on renal dialysis: Secondary | ICD-10-CM | POA: Diagnosis not present

## 2023-11-21 DIAGNOSIS — R058 Other specified cough: Secondary | ICD-10-CM

## 2023-11-21 DIAGNOSIS — N186 End stage renal disease: Secondary | ICD-10-CM | POA: Diagnosis not present

## 2023-11-21 DIAGNOSIS — R059 Cough, unspecified: Secondary | ICD-10-CM | POA: Diagnosis not present

## 2023-11-21 DIAGNOSIS — D631 Anemia in chronic kidney disease: Secondary | ICD-10-CM | POA: Diagnosis not present

## 2023-11-21 DIAGNOSIS — R0602 Shortness of breath: Secondary | ICD-10-CM | POA: Diagnosis not present

## 2023-11-21 DIAGNOSIS — J019 Acute sinusitis, unspecified: Secondary | ICD-10-CM | POA: Diagnosis not present

## 2023-11-21 DIAGNOSIS — N2581 Secondary hyperparathyroidism of renal origin: Secondary | ICD-10-CM | POA: Diagnosis not present

## 2023-11-21 DIAGNOSIS — I517 Cardiomegaly: Secondary | ICD-10-CM | POA: Diagnosis not present

## 2023-11-21 DIAGNOSIS — R011 Cardiac murmur, unspecified: Secondary | ICD-10-CM | POA: Diagnosis not present

## 2023-11-23 DIAGNOSIS — Z992 Dependence on renal dialysis: Secondary | ICD-10-CM | POA: Diagnosis not present

## 2023-11-23 DIAGNOSIS — N2581 Secondary hyperparathyroidism of renal origin: Secondary | ICD-10-CM | POA: Diagnosis not present

## 2023-11-23 DIAGNOSIS — N186 End stage renal disease: Secondary | ICD-10-CM | POA: Diagnosis not present

## 2023-11-23 DIAGNOSIS — D631 Anemia in chronic kidney disease: Secondary | ICD-10-CM | POA: Diagnosis not present

## 2023-11-25 ENCOUNTER — Ambulatory Visit (HOSPITAL_COMMUNITY)
Admission: RE | Admit: 2023-11-25 | Discharge: 2023-11-25 | Disposition: A | Discharge status: Home / Self Care | Source: Ambulatory Visit | Attending: Nephrology | Admitting: Nephrology

## 2023-11-25 DIAGNOSIS — Z4901 Encounter for fitting and adjustment of extracorporeal dialysis catheter: Secondary | ICD-10-CM | POA: Diagnosis not present

## 2023-11-25 DIAGNOSIS — N186 End stage renal disease: Secondary | ICD-10-CM | POA: Diagnosis not present

## 2023-11-25 HISTORY — PX: IR REMOVAL TUN CV CATH W/O FL: IMG2289

## 2023-11-25 MED ORDER — LIDOCAINE-EPINEPHRINE 1 %-1:100000 IJ SOLN
INTRAMUSCULAR | Status: AC
  Filled 2023-11-25: qty 1

## 2023-11-25 MED ORDER — LIDOCAINE-EPINEPHRINE 1 %-1:100000 IJ SOLN
20.0000 mL | Freq: Once | INTRAMUSCULAR | Status: AC
  Administered 2023-11-25: 10 mL via INTRADERMAL

## 2023-11-25 NOTE — Procedures (Signed)
 Interventional Radiology Procedure Note  Risks and benefits of removal of hemodialysis catheter were discussed with the patient including, but not limited to bleeding, hematoma, infection, damage to adjacent structures.  All of the patient's questions were answered, patient is agreeable to proceed. Consent signed and in chart. PROCEDURE SUMMARY:  Successful removal of tunneled dialysis catheter.  No complications.   EBL = trace  Please see full dictation in imaging section of Epic for procedure details.  Glenn JAYSON Beagle NP 11/25/2023 9:55 AM

## 2023-11-26 DIAGNOSIS — D631 Anemia in chronic kidney disease: Secondary | ICD-10-CM | POA: Diagnosis not present

## 2023-11-26 DIAGNOSIS — N2581 Secondary hyperparathyroidism of renal origin: Secondary | ICD-10-CM | POA: Diagnosis not present

## 2023-11-26 DIAGNOSIS — N186 End stage renal disease: Secondary | ICD-10-CM | POA: Diagnosis not present

## 2023-11-26 DIAGNOSIS — Z992 Dependence on renal dialysis: Secondary | ICD-10-CM | POA: Diagnosis not present

## 2023-11-27 DIAGNOSIS — E782 Mixed hyperlipidemia: Secondary | ICD-10-CM | POA: Diagnosis not present

## 2023-11-27 DIAGNOSIS — E039 Hypothyroidism, unspecified: Secondary | ICD-10-CM | POA: Diagnosis not present

## 2023-11-27 DIAGNOSIS — N312 Flaccid neuropathic bladder, not elsewhere classified: Secondary | ICD-10-CM | POA: Diagnosis not present

## 2023-11-27 DIAGNOSIS — R413 Other amnesia: Secondary | ICD-10-CM | POA: Diagnosis not present

## 2023-11-27 DIAGNOSIS — I480 Paroxysmal atrial fibrillation: Secondary | ICD-10-CM | POA: Diagnosis not present

## 2023-11-27 DIAGNOSIS — K219 Gastro-esophageal reflux disease without esophagitis: Secondary | ICD-10-CM | POA: Diagnosis not present

## 2023-11-27 DIAGNOSIS — J309 Allergic rhinitis, unspecified: Secondary | ICD-10-CM | POA: Diagnosis not present

## 2023-11-27 DIAGNOSIS — I10 Essential (primary) hypertension: Secondary | ICD-10-CM | POA: Diagnosis not present

## 2023-11-27 DIAGNOSIS — N184 Chronic kidney disease, stage 4 (severe): Secondary | ICD-10-CM | POA: Diagnosis not present

## 2023-11-28 DIAGNOSIS — N2581 Secondary hyperparathyroidism of renal origin: Secondary | ICD-10-CM | POA: Diagnosis not present

## 2023-11-28 DIAGNOSIS — Z992 Dependence on renal dialysis: Secondary | ICD-10-CM | POA: Diagnosis not present

## 2023-11-28 DIAGNOSIS — D631 Anemia in chronic kidney disease: Secondary | ICD-10-CM | POA: Diagnosis not present

## 2023-11-28 DIAGNOSIS — N186 End stage renal disease: Secondary | ICD-10-CM | POA: Diagnosis not present

## 2023-11-29 DIAGNOSIS — E782 Mixed hyperlipidemia: Secondary | ICD-10-CM | POA: Diagnosis not present

## 2023-11-29 DIAGNOSIS — E039 Hypothyroidism, unspecified: Secondary | ICD-10-CM | POA: Diagnosis not present

## 2023-11-29 DIAGNOSIS — N184 Chronic kidney disease, stage 4 (severe): Secondary | ICD-10-CM | POA: Diagnosis not present

## 2023-11-29 DIAGNOSIS — I10 Essential (primary) hypertension: Secondary | ICD-10-CM | POA: Diagnosis not present

## 2023-11-30 DIAGNOSIS — Z992 Dependence on renal dialysis: Secondary | ICD-10-CM | POA: Diagnosis not present

## 2023-11-30 DIAGNOSIS — N186 End stage renal disease: Secondary | ICD-10-CM | POA: Diagnosis not present

## 2023-11-30 DIAGNOSIS — N2581 Secondary hyperparathyroidism of renal origin: Secondary | ICD-10-CM | POA: Diagnosis not present

## 2023-11-30 DIAGNOSIS — D631 Anemia in chronic kidney disease: Secondary | ICD-10-CM | POA: Diagnosis not present

## 2023-11-30 DIAGNOSIS — I129 Hypertensive chronic kidney disease with stage 1 through stage 4 chronic kidney disease, or unspecified chronic kidney disease: Secondary | ICD-10-CM | POA: Diagnosis not present

## 2023-12-03 ENCOUNTER — Ambulatory Visit (HOSPITAL_COMMUNITY)

## 2023-12-03 DIAGNOSIS — N186 End stage renal disease: Secondary | ICD-10-CM | POA: Diagnosis not present

## 2023-12-03 DIAGNOSIS — D631 Anemia in chronic kidney disease: Secondary | ICD-10-CM | POA: Diagnosis not present

## 2023-12-03 DIAGNOSIS — N2581 Secondary hyperparathyroidism of renal origin: Secondary | ICD-10-CM | POA: Diagnosis not present

## 2023-12-03 DIAGNOSIS — Z992 Dependence on renal dialysis: Secondary | ICD-10-CM | POA: Diagnosis not present

## 2023-12-05 DIAGNOSIS — N186 End stage renal disease: Secondary | ICD-10-CM | POA: Diagnosis not present

## 2023-12-05 DIAGNOSIS — N2581 Secondary hyperparathyroidism of renal origin: Secondary | ICD-10-CM | POA: Diagnosis not present

## 2023-12-05 DIAGNOSIS — Z992 Dependence on renal dialysis: Secondary | ICD-10-CM | POA: Diagnosis not present

## 2023-12-05 DIAGNOSIS — D631 Anemia in chronic kidney disease: Secondary | ICD-10-CM | POA: Diagnosis not present

## 2023-12-07 DIAGNOSIS — N186 End stage renal disease: Secondary | ICD-10-CM | POA: Diagnosis not present

## 2023-12-07 DIAGNOSIS — D631 Anemia in chronic kidney disease: Secondary | ICD-10-CM | POA: Diagnosis not present

## 2023-12-07 DIAGNOSIS — N2581 Secondary hyperparathyroidism of renal origin: Secondary | ICD-10-CM | POA: Diagnosis not present

## 2023-12-07 DIAGNOSIS — Z992 Dependence on renal dialysis: Secondary | ICD-10-CM | POA: Diagnosis not present

## 2023-12-10 DIAGNOSIS — N186 End stage renal disease: Secondary | ICD-10-CM | POA: Diagnosis not present

## 2023-12-10 DIAGNOSIS — Z992 Dependence on renal dialysis: Secondary | ICD-10-CM | POA: Diagnosis not present

## 2023-12-10 DIAGNOSIS — N2581 Secondary hyperparathyroidism of renal origin: Secondary | ICD-10-CM | POA: Diagnosis not present

## 2023-12-10 DIAGNOSIS — D631 Anemia in chronic kidney disease: Secondary | ICD-10-CM | POA: Diagnosis not present

## 2023-12-11 DIAGNOSIS — R058 Other specified cough: Secondary | ICD-10-CM | POA: Diagnosis not present

## 2023-12-11 DIAGNOSIS — R0989 Other specified symptoms and signs involving the circulatory and respiratory systems: Secondary | ICD-10-CM | POA: Diagnosis not present

## 2023-12-11 DIAGNOSIS — R051 Acute cough: Secondary | ICD-10-CM | POA: Diagnosis not present

## 2023-12-11 DIAGNOSIS — M25551 Pain in right hip: Secondary | ICD-10-CM | POA: Diagnosis not present

## 2023-12-12 DIAGNOSIS — Z992 Dependence on renal dialysis: Secondary | ICD-10-CM | POA: Diagnosis not present

## 2023-12-12 DIAGNOSIS — N186 End stage renal disease: Secondary | ICD-10-CM | POA: Diagnosis not present

## 2023-12-12 DIAGNOSIS — D631 Anemia in chronic kidney disease: Secondary | ICD-10-CM | POA: Diagnosis not present

## 2023-12-12 DIAGNOSIS — N2581 Secondary hyperparathyroidism of renal origin: Secondary | ICD-10-CM | POA: Diagnosis not present

## 2023-12-13 ENCOUNTER — Ambulatory Visit

## 2023-12-13 DIAGNOSIS — N312 Flaccid neuropathic bladder, not elsewhere classified: Secondary | ICD-10-CM

## 2023-12-14 DIAGNOSIS — N186 End stage renal disease: Secondary | ICD-10-CM | POA: Diagnosis not present

## 2023-12-14 DIAGNOSIS — D631 Anemia in chronic kidney disease: Secondary | ICD-10-CM | POA: Diagnosis not present

## 2023-12-14 DIAGNOSIS — N2581 Secondary hyperparathyroidism of renal origin: Secondary | ICD-10-CM | POA: Diagnosis not present

## 2023-12-14 DIAGNOSIS — Z992 Dependence on renal dialysis: Secondary | ICD-10-CM | POA: Diagnosis not present

## 2023-12-17 DIAGNOSIS — N186 End stage renal disease: Secondary | ICD-10-CM | POA: Diagnosis not present

## 2023-12-17 DIAGNOSIS — Z992 Dependence on renal dialysis: Secondary | ICD-10-CM | POA: Diagnosis not present

## 2023-12-17 DIAGNOSIS — D631 Anemia in chronic kidney disease: Secondary | ICD-10-CM | POA: Diagnosis not present

## 2023-12-17 DIAGNOSIS — N2581 Secondary hyperparathyroidism of renal origin: Secondary | ICD-10-CM | POA: Diagnosis not present

## 2023-12-18 ENCOUNTER — Ambulatory Visit (HOSPITAL_COMMUNITY): Payer: Self-pay | Admitting: Physician Assistant

## 2023-12-18 ENCOUNTER — Ambulatory Visit (HOSPITAL_COMMUNITY)
Admission: RE | Admit: 2023-12-18 | Discharge: 2023-12-18 | Disposition: A | Discharge status: Home / Self Care | Source: Ambulatory Visit | Attending: Physician Assistant | Admitting: Physician Assistant

## 2023-12-18 DIAGNOSIS — I4510 Unspecified right bundle-branch block: Secondary | ICD-10-CM | POA: Diagnosis not present

## 2023-12-18 DIAGNOSIS — I491 Atrial premature depolarization: Secondary | ICD-10-CM | POA: Diagnosis not present

## 2023-12-18 DIAGNOSIS — R011 Cardiac murmur, unspecified: Secondary | ICD-10-CM | POA: Diagnosis not present

## 2023-12-18 DIAGNOSIS — I4891 Unspecified atrial fibrillation: Secondary | ICD-10-CM | POA: Diagnosis not present

## 2023-12-18 DIAGNOSIS — I272 Pulmonary hypertension, unspecified: Secondary | ICD-10-CM

## 2023-12-18 DIAGNOSIS — R058 Other specified cough: Secondary | ICD-10-CM | POA: Insufficient documentation

## 2023-12-18 DIAGNOSIS — I2699 Other pulmonary embolism without acute cor pulmonale: Secondary | ICD-10-CM

## 2023-12-18 DIAGNOSIS — I77810 Thoracic aortic ectasia: Secondary | ICD-10-CM | POA: Insufficient documentation

## 2023-12-18 DIAGNOSIS — I4892 Unspecified atrial flutter: Secondary | ICD-10-CM | POA: Insufficient documentation

## 2023-12-18 DIAGNOSIS — I440 Atrioventricular block, first degree: Secondary | ICD-10-CM | POA: Insufficient documentation

## 2023-12-18 LAB — ECHOCARDIOGRAM COMPLETE
AR max vel: 2.38 cm2
AV Area VTI: 2.06 cm2
AV Area mean vel: 2.1 cm2
AV Mean grad: 5 mmHg
AV Peak grad: 9.2 mmHg
Ao pk vel: 1.52 m/s
Area-P 1/2: 3.51 cm2
MV VTI: 2.09 cm2
S' Lateral: 3.1 cm

## 2023-12-19 DIAGNOSIS — D631 Anemia in chronic kidney disease: Secondary | ICD-10-CM | POA: Diagnosis not present

## 2023-12-19 DIAGNOSIS — N2581 Secondary hyperparathyroidism of renal origin: Secondary | ICD-10-CM | POA: Diagnosis not present

## 2023-12-19 DIAGNOSIS — N186 End stage renal disease: Secondary | ICD-10-CM | POA: Diagnosis not present

## 2023-12-19 DIAGNOSIS — Z992 Dependence on renal dialysis: Secondary | ICD-10-CM | POA: Diagnosis not present

## 2023-12-21 DIAGNOSIS — N2581 Secondary hyperparathyroidism of renal origin: Secondary | ICD-10-CM | POA: Diagnosis not present

## 2023-12-21 DIAGNOSIS — D631 Anemia in chronic kidney disease: Secondary | ICD-10-CM | POA: Diagnosis not present

## 2023-12-21 DIAGNOSIS — N186 End stage renal disease: Secondary | ICD-10-CM | POA: Diagnosis not present

## 2023-12-21 DIAGNOSIS — Z992 Dependence on renal dialysis: Secondary | ICD-10-CM | POA: Diagnosis not present

## 2023-12-24 DIAGNOSIS — N186 End stage renal disease: Secondary | ICD-10-CM | POA: Diagnosis not present

## 2023-12-24 DIAGNOSIS — D631 Anemia in chronic kidney disease: Secondary | ICD-10-CM | POA: Diagnosis not present

## 2023-12-24 DIAGNOSIS — N2581 Secondary hyperparathyroidism of renal origin: Secondary | ICD-10-CM | POA: Diagnosis not present

## 2023-12-24 DIAGNOSIS — Z992 Dependence on renal dialysis: Secondary | ICD-10-CM | POA: Diagnosis not present

## 2023-12-24 NOTE — Telephone Encounter (Signed)
 The patient has been notified of the result and verbalized understanding.  All questions (if any) were answered. Rosina JAYSON Cornea, CMA 12/24/2023 3:47 PM

## 2023-12-24 NOTE — Telephone Encounter (Signed)
-----   Message from [name] sent at 12/18/2023  4:06 PM EST ----- He has had an indwelling dialysis catheter that was removed in October I would order CTA chest to r/o PE and have him see CHF clinic to arrange a right heart cath for pulmonary HTN  ----- Message ----- From: Nellene Quita SAUNDERS, PA Sent: 12/18/2023   3:37 PM EST To: Maude JAYSON Emmer, MD; Glade GORMAN Pepper, RN  Normal heart pumping function EF 55-60%. Severely elevated pulmonary artery pressure with evidence of RV pressure overload.  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  ----- Message ----- From: Interface, [employer] Sent: 12/18/2023   2:53 PM EST To: Quita SAUNDERS Nellene, PA

## 2023-12-25 DIAGNOSIS — R059 Cough, unspecified: Secondary | ICD-10-CM | POA: Diagnosis not present

## 2023-12-25 DIAGNOSIS — E039 Hypothyroidism, unspecified: Secondary | ICD-10-CM | POA: Diagnosis not present

## 2023-12-25 DIAGNOSIS — N186 End stage renal disease: Secondary | ICD-10-CM | POA: Diagnosis not present

## 2023-12-25 DIAGNOSIS — N2581 Secondary hyperparathyroidism of renal origin: Secondary | ICD-10-CM | POA: Diagnosis not present

## 2023-12-25 DIAGNOSIS — Z992 Dependence on renal dialysis: Secondary | ICD-10-CM | POA: Diagnosis not present

## 2023-12-25 DIAGNOSIS — J309 Allergic rhinitis, unspecified: Secondary | ICD-10-CM | POA: Diagnosis not present

## 2023-12-25 DIAGNOSIS — K219 Gastro-esophageal reflux disease without esophagitis: Secondary | ICD-10-CM | POA: Diagnosis not present

## 2023-12-25 DIAGNOSIS — D631 Anemia in chronic kidney disease: Secondary | ICD-10-CM | POA: Diagnosis not present

## 2023-12-25 DIAGNOSIS — R413 Other amnesia: Secondary | ICD-10-CM | POA: Diagnosis not present

## 2023-12-25 DIAGNOSIS — E782 Mixed hyperlipidemia: Secondary | ICD-10-CM | POA: Diagnosis not present

## 2023-12-25 DIAGNOSIS — N312 Flaccid neuropathic bladder, not elsewhere classified: Secondary | ICD-10-CM | POA: Diagnosis not present

## 2023-12-25 DIAGNOSIS — N184 Chronic kidney disease, stage 4 (severe): Secondary | ICD-10-CM | POA: Diagnosis not present

## 2023-12-25 DIAGNOSIS — I480 Paroxysmal atrial fibrillation: Secondary | ICD-10-CM | POA: Diagnosis not present

## 2023-12-25 DIAGNOSIS — I10 Essential (primary) hypertension: Secondary | ICD-10-CM | POA: Diagnosis not present

## 2023-12-25 DIAGNOSIS — M25551 Pain in right hip: Secondary | ICD-10-CM | POA: Diagnosis not present

## 2023-12-28 DIAGNOSIS — D631 Anemia in chronic kidney disease: Secondary | ICD-10-CM | POA: Diagnosis not present

## 2023-12-28 DIAGNOSIS — Z992 Dependence on renal dialysis: Secondary | ICD-10-CM | POA: Diagnosis not present

## 2023-12-28 DIAGNOSIS — N186 End stage renal disease: Secondary | ICD-10-CM | POA: Diagnosis not present

## 2023-12-28 DIAGNOSIS — N2581 Secondary hyperparathyroidism of renal origin: Secondary | ICD-10-CM | POA: Diagnosis not present

## 2023-12-29 DIAGNOSIS — E782 Mixed hyperlipidemia: Secondary | ICD-10-CM | POA: Diagnosis not present

## 2023-12-29 DIAGNOSIS — I10 Essential (primary) hypertension: Secondary | ICD-10-CM | POA: Diagnosis not present

## 2023-12-29 DIAGNOSIS — N184 Chronic kidney disease, stage 4 (severe): Secondary | ICD-10-CM | POA: Diagnosis not present

## 2023-12-29 DIAGNOSIS — I480 Paroxysmal atrial fibrillation: Secondary | ICD-10-CM | POA: Diagnosis not present

## 2024-01-03 NOTE — Progress Notes (Signed)
 CARDIOLOGY CONSULT NOTE       Patient ID: Broderic Bara MRN: [id number] DOB/AGE: Dec 19, 1940 83 y.o.  Primary Physician: [name], MD Primary Cardiologist: New to me Prior Varanasi Reason for Consultation: AFib    HPI:  83 y.o. previously seen in consult by [name] 05/30/22 for afib.  History of CRF. Had stopped dialysis for over a year when admitted in May with uremia and dialysis resumed. Noted to be in flutter/fib and converted spontaneously on 06/03/22 and then paroxysmal. Multiple electrolyte/lab abnormalities including K 2.4 , Mg 1.6 and Hb 6.7  CHADVASC 2. Eventually had TEE/DCC with [name] 06/19/22 RV/LV normal no significant valve dx. NO LAA thrombus  Back in ER 07/11/22 with PAF in setting of suprpubic catheter being embedded in bladder wall  Has had issues with med compliance running out of cardizem .  LA moderately dilated on TTE 06/05/22 Maintained on amiodarone  200 mg daily and low dose eliquis  2.5 mg bid ( age >64 and ESRD ) He gets dialysis Tu/Th/Sat  Sees Dr [name] from [facility] and had left brachial to cephalic vein fistula created 87/86/75 Not functional and still has right subclavian catheter He still makes a bit of urine and has a suprapubic catheter with bag at night   Gets home dialysis and had ER visit 01/25/23 for ? VT but in ER ECG showed flutter rate 99 with chronic RBBB. Now getting dialysis in [place] via new upper left arm graft.   Daughter in law with him today she is a Fish [employer] Manager in [place] and is helpful   He is in NSR today Discussed need to r/o CAD and monitor Also discussed need for AV nodal drug. Updated TTE 12/18/23 showed EF 55-60% with evidence of severe pulmonary HTN estimated PA 79 mmHg Severe LAE Last tunneled IR catheter for dialysis was placed on 11/25/23   He use to ride an old Kristi Ramal and likes [place].   ROS All other systems reviewed and negative except as noted above  Past Medical History:  Diagnosis Date   Anemia    Cancer (HCC)     Chronic kidney disease    Dysrhythmia    PAF 05/30/22   GERD (gastroesophageal reflux disease)    Neurogenic bladder    Renal insufficiency     Family History  Problem Relation Age of Onset   Alzheimer's disease Neg Hx    Dementia Neg Hx     Social History   Socioeconomic History   Marital status: Single    Spouse name: Not on file   Number of children: Not on file   Years of education: Not on file   Highest education level: Not on file  Occupational History   Not on file  Tobacco Use   Smoking status: Never   Smokeless tobacco: Never   Tobacco comments:    Never smoked 09/26/22  Vaping Use   Vaping status: Never Used  Substance and Sexual Activity   Alcohol use: Yes    Comment: patient states he is a social drinker   Drug use: No   Sexual activity: Not on file  Other Topics Concern   Not on file  Social History Narrative   Dialysis on T-Th-Sat   Lives with family '   Retired    Social Drivers of Health   Tobacco Use: Low Risk (01/10/2024)   Patient History    Smoking Tobacco Use: Never    Smokeless Tobacco Use: Never    Passive Exposure: Not  on file  Financial Resource Strain: Not on file  Food Insecurity: No Food Insecurity (06/18/2022)   Hunger Vital Sign    Worried About Running Out of Food in the Last Year: Never true    Ran Out of Food in the Last Year: Never true  Transportation Needs: No Transportation Needs (06/18/2022)   PRAPARE - Administrator, Civil Service (Medical): No    Lack of Transportation (Non-Medical): No  Physical Activity: Not on file  Stress: Not on file  Social Connections: Not on file  Intimate Partner Violence: Not At Risk (06/18/2022)   Humiliation, Afraid, Rape, and Kick questionnaire    Fear of Current or Ex-Partner: No    Emotionally Abused: No    Physically Abused: No    Sexually Abused: No  Depression (PHQ2-9): Not on file  Alcohol Screen: Not on file  Housing: Low Risk (06/18/2022)   Housing    Last Housing  Risk Score: 0  Utilities: Not At Risk (06/18/2022)   AHC Utilities    Threatened with loss of utilities: No  Health Literacy: Not on file    Past Surgical History:  Procedure Laterality Date   A/V SHUNT INTERVENTION Left 07/17/2023   Procedure: A/V SHUNT INTERVENTION;  Surgeon: Pearline Norman RAMAN, MD;  Location: [facility];  Service: Cardiovascular;  Laterality: Left;   AV FISTULA PLACEMENT Left 09/27/2020   Procedure: LEFT ARM ARTERIOVENOUS GRAFT PLACEMENT USING GORE-TEX 4-7MM X 45CM  VASCULAR GRAFT;  Surgeon: [name], MD;  Location: [facility];  Service: Vascular;  Laterality: Left;   AV FISTULA PLACEMENT Left 01/11/2023   Procedure: LEFT ARM ARTERIOVENOUS (AV) FISTULA CREATION;  Surgeon: [name] Penne Bruckner, MD;  Location: [facility] OR;  Service: Vascular;  Laterality: Left;   CAPD INSERTION N/A 03/17/2021   Procedure: ATTEMPTED INSERTION OF LAPAROSCOPIC CONTINUOUS AMBULATORY PERITONEAL DIALYSIS (CAPD) CATHETER;  Surgeon: [name] Penne Bruckner, MD;  Location: [facility] OR;  Service: Vascular;  Laterality: N/A;   CARDIOVERSION N/A 06/19/2022   Procedure: CARDIOVERSION;  Surgeon: [name], MD;  Location: [facility] INVASIVE CV LAB;  Service: Cardiovascular;  Laterality: N/A;   CYSTOURETHROSCOPY     INSERTION OF DIALYSIS CATHETER Right 09/27/2020   Procedure: INSERTION OF PALINDROME TUNNELED DIALYSIS CATHETER 14.27f X 23CM;  Surgeon: [name], MD;  Location: [facility];  Service: Vascular;  Laterality: Right;   INSERTION OF DIALYSIS CATHETER Right 05/31/2022   Procedure: INSERTION OF DIALYSIS CATHETER;  Surgeon: [name], MD;  Location: [facility] OR;  Service: Vascular;  Laterality: Right;   IR REMOVAL TUN CV CATH W/O FL  06/20/2022   IR REMOVAL TUN CV CATH W/O FL  11/25/2023   REMOVAL OF A DIALYSIS CATHETER N/A 12/06/2020   Procedure: MINOR REMOVAL OF A DIALYSIS CATHETER;  Surgeon: [name], MD;  Location: [facility];  Service: Vascular;  Laterality: N/A;   SUPRAPUBIC CATHETER PLACEMENT     TEE  WITHOUT CARDIOVERSION N/A 06/19/2022   Procedure: TRANSESOPHAGEAL ECHOCARDIOGRAM;  Surgeon: [name], MD;  Location: [facility] INVASIVE CV LAB;  Service: Cardiovascular;  Laterality: N/A;   VENOUS ANGIOPLASTY  07/17/2023   Procedure: VENOUS ANGIOPLASTY;  Surgeon: Pearline Norman RAMAN, MD;  Location: [facility];  Service: Cardiovascular;;   VENOUS STENT  07/17/2023   Procedure: VENOUS STENT;  Surgeon: Pearline Norman RAMAN, MD;  Location: [facility];  Service: Cardiovascular;;      Current Outpatient Medications:    amiodarone  (PACERONE ) 200 MG tablet, TAKE 1 TABLET DAILY,  Disp: 90 tablet, Rfl: 2   apixaban  (ELIQUIS ) 2.5 MG TABS tablet, Take 1 tablet by mouth twice daily, Disp: 180 tablet, Rfl: 1   atorvastatin  (LIPITOR) 20 MG tablet, Take 1 tablet (20 mg total) by mouth daily., Disp: 90 tablet, Rfl: 3   calcitRIOL  (ROCALTROL ) 0.25 MCG capsule, Take 0.25 mcg by mouth 3 (three) times a week., Disp: , Rfl:    fluticasone (FLONASE) 50 MCG/ACT nasal spray, Place 2 sprays into both nostrils daily., Disp: , Rfl:    Levothyroxine  Sodium 112 MCG CAPS, Take 1 capsule by mouth every morning., Disp: , Rfl:    lidocaine -prilocaine  (EMLA ) cream, Apply 1 Application topically as needed., Disp: , Rfl:    loratadine  (CLARITIN ) 10 MG tablet, Take 1 tablet (10 mg total) by mouth daily., Disp: 30 tablet, Rfl: 0   melatonin 3 MG TABS tablet, Take 1 tablet (3 mg total) by mouth at bedtime as needed., Disp: 30 tablet, Rfl: 0   metoprolol  succinate (TOPROL  XL) 25 MG 24 hr tablet, Take 0.5 tablets (12.5 mg total) by mouth daily., Disp: 15 tablet, Rfl: 11   VELPHORO 500 MG chewable tablet, Chew 500 mg by mouth 3 (three) times daily., Disp: , Rfl:     Physical Exam: Blood pressure (!) 142/62, pulse 70, height 6' (1.829 m), weight 157 lb 9.6 oz (71.5 kg), SpO2 97%.    Chronically ill male Lungs clear SEM murmur radiates to carotids Right subclavian dialysis catheter  Left UE fistula Abdomen benign suprapubic catheter   Trace edema Palpable pedal pulses   Labs:   Lab Results  Component Value Date   WBC 4.9 08/14/2023   HGB 11.2 (L) 08/14/2023   HCT 34.0 (L) 08/14/2023   MCV 96.9 08/14/2023   PLT 152 08/14/2023   No results for input(s): NA, K, CL, CO2, BUN, CREATININE, CALCIUM , PROT, BILITOT, ALKPHOS, ALT, AST, GLUCOSE in the last 168 hours.  Invalid input(s): LABALBU No results found for: CKTOTAL, CKMB, CKMBINDEX, TROPONINI No results found for: CHOL No results found for: HDL No results found for: LDLCALC No results found for: TRIG No results found for: CHOLHDL No results found for: LDLDIRECT    Radiology: ECHOCARDIOGRAM COMPLETE Result Date: 12/18/2023    ECHOCARDIOGRAM REPORT   Patient Name:   ZYMIRE TURNBO Date of Exam: 12/18/2023 Medical Rec #:  [id number]      Height:       72.0 in Accession #:    [id number]     Weight:       171.8 lb Date of Birth:  [birth year]      BSA:          1.997 m Patient Age:    83 years       BP:           142/70 mmHg Patient Gender: M              HR:           74 bpm. Exam Location:  Outpatient Procedure: 2D Echo, 3D Echo, Cardiac Doppler, Color Doppler and Strain Analysis            (Both Spectral and Color Flow Doppler were utilized during            procedure). Indications:     R01.1 Murmur  History:         Patient has prior history of Echocardiogram examinations, most  recent 06/19/2022. Arrythmias:1st degree AV Block, RBBB, Atrial                  Fibrillation, Atrial Flutter and PAC, Signs/Symptoms:Murmur;                  Risk Factors:Non-Smoker. Patient denies chest pain, SOB and leg                  edema.  Sonographer:     Annabella Cater RVT, RDCS (AE), RDMS Referring Phys:  [id number] CLINT R FENTON Diagnosing Phys: Georganna Archer  Sonographer Comments: Tight rib spaces. Patient had a persistent cough this morning. IMPRESSIONS  1. Left ventricular ejection fraction, by estimation, is 55 to 60%. The  left ventricle has normal function. The left ventricle has no regional wall motion abnormalities. There is mild concentric left ventricular hypertrophy. Left ventricular diastolic parameters are consistent with Grade I diastolic dysfunction (impaired relaxation). There is the interventricular septum is flattened in systole, consistent with right ventricular pressure overload. The average left ventricular global longitudinal strain  is -11.0 %. The global longitudinal strain is abnormal.  2. Right ventricular systolic function is normal. The right ventricular size is mildly enlarged. There is severely elevated pulmonary artery systolic pressure. The estimated right ventricular systolic pressure is 79.0 mmHg.  3. Left atrial size was severely dilated.  4. Right atrial size was mildly dilated.  5. The mitral valve is degenerative. No evidence of mitral valve regurgitation. No evidence of mitral stenosis. Moderate mitral annular calcification.  6. The aortic valve is tricuspid. Aortic valve regurgitation is not visualized. No aortic stenosis is present.  7. Aortic dilatation noted. There is mild dilatation of the aortic root, measuring 40 mm.  8. The inferior vena cava is dilated in size with <50% respiratory variability, suggesting right atrial pressure of 15 mmHg. Comparison(s): Changes from prior study are noted. Markedly elevated RVSP. Conclusion(s)/Recommendation(s): Normal LV and RV systolic function. Mild RV dilation with severely elevated RVSP = 79. Recommend right heart catheterization to confirm elevated pulmonary pressures. Recommend repeat echocardiogram vs CT in 12 months for surveillance of aortic root dilation. FINDINGS  Left Ventricle: Left ventricular ejection fraction, by estimation, is 55 to 60%. The left ventricle has normal function. The left ventricle has no regional wall motion abnormalities. The average left ventricular global longitudinal strain is -11.0 %. Strain was performed and the global  longitudinal strain is abnormal. The left ventricular internal cavity size was normal in size. There is mild concentric left ventricular hypertrophy. The interventricular septum is flattened in systole, consistent with  right ventricular pressure overload. Left ventricular diastolic parameters are consistent with Grade I diastolic dysfunction (impaired relaxation). Right Ventricle: The right ventricular size is mildly enlarged. No increase in right ventricular wall thickness. Right ventricular systolic function is normal. There is severely elevated pulmonary artery systolic pressure. The tricuspid regurgitant velocity is 4.00 m/s, and with an assumed right atrial pressure of 15 mmHg, the estimated right ventricular systolic pressure is 79.0 mmHg. Left Atrium: Left atrial size was severely dilated. Right Atrium: Right atrial size was mildly dilated. Pericardium: There is no evidence of pericardial effusion. Mitral Valve: The mitral valve is degenerative in appearance. Moderate mitral annular calcification. No evidence of mitral valve regurgitation. No evidence of mitral valve stenosis. MV peak gradient, 8.2 mmHg. The mean mitral valve gradient is 3.0 mmHg. Tricuspid Valve: The tricuspid valve is normal in structure. Tricuspid valve regurgitation is mild . No evidence of tricuspid stenosis. Aortic Valve:  The aortic valve is tricuspid. Aortic valve regurgitation is not visualized. No aortic stenosis is present. Aortic valve mean gradient measures 5.0 mmHg. Aortic valve peak gradient measures 9.2 mmHg. Aortic valve area, by VTI measures 2.06 cm. Pulmonic Valve: The pulmonic valve was normal in structure. Pulmonic valve regurgitation is mild. No evidence of pulmonic stenosis. Aorta: Aortic dilatation noted. There is mild dilatation of the aortic root, measuring 40 mm. Venous: The inferior vena cava is dilated in size with less than 50% respiratory variability, suggesting right atrial pressure of 15 mmHg. IAS/Shunts: No  atrial level shunt detected by color flow Doppler.  LEFT VENTRICLE PLAX 2D LVIDd:         4.80 cm   Diastology LVIDs:         3.10 cm   LV e' medial:    4.61 cm/s LV PW:         1.40 cm   LV E/e' medial:  26.2 LV IVS:        1.20 cm   LV e' lateral:   4.95 cm/s LVOT diam:     2.00 cm   LV E/e' lateral: 24.4 LV SV:         69 LV SV Index:   35        2D Longitudinal Strain LVOT Area:     3.14 cm  2D Strain GLS Avg:     -11.0 %                           3D Volume EF:                          3D EF:        51 %                          LV EDV:       165 ml                          LV ESV:       81 ml                          LV SV:        84 ml RIGHT VENTRICLE RV S prime:     14.30 cm/s TAPSE (M-mode): 2.6 cm LEFT ATRIUM              Index        RIGHT ATRIUM           Index LA diam:        4.70 cm  2.35 cm/m   RA Area:     23.40 cm LA Vol (A2C):   134.0 ml 67.09 ml/m  RA Volume:   77.40 ml  38.75 ml/m LA Vol (A4C):   95.2 ml  47.66 ml/m LA Biplane Vol: 122.0 ml 61.08 ml/m  AORTIC VALVE                     PULMONIC VALVE AV Area (Vmax):    2.38 cm      PV Vmax:       0.85 m/s AV Area (Vmean):   2.10 cm      PV Peak grad:  2.9 mmHg AV Area (VTI):     2.06  cm AV Vmax:           152.00 cm/s AV Vmean:          106.000 cm/s AV VTI:            0.335 m AV Peak Grad:      9.2 mmHg AV Mean Grad:      5.0 mmHg LVOT Vmax:         115.00 cm/s LVOT Vmean:        70.700 cm/s LVOT VTI:          0.220 m LVOT/AV VTI ratio: 0.66  AORTA Ao Root diam: 4.00 cm Ao Asc diam:  3.70 cm Ao Arch diam: 3.1 cm MITRAL VALVE                TRICUSPID VALVE MV Area (PHT): 3.51 cm     TR Peak grad:   64.0 mmHg MV Area VTI:   2.09 cm     TR Vmax:        400.00 cm/s MV Peak grad:  8.2 mmHg MV Mean grad:  3.0 mmHg     SHUNTS MV Vmax:       1.43 m/s     Systemic VTI:  0.22 m MV Vmean:      80.3 cm/s    Systemic Diam: 2.00 cm MV Decel Time: 216 msec MV E velocity: 121.00 cm/s MV A velocity: 91.70 cm/s MV E/A ratio:  1.32 Georganna Archer  Electronically signed by Georganna Archer Signature Date/Time: 12/18/2023/2:53:36 PM    Final (Updated)     EKG: Flutter RBBB rate 99 01/28/23   ASSESSMENT AND PLAN:   PaF/Flutter:   with DCC on 06/19/22 and 07/12/22 on low dose eliquis  appropriate for age/CRF. Also on amiodarone  200 mg daily . TSH/LFTls have been normal. Update labs.  CRF:  left UE fistula f/u Dr [name]. This was revised on 01/11/23  Dialysis T/Th/Saturday at [facility].  Labs with nephrology Saw [place] in hospital Anemia: improved any need for erythropoietin  for nephrology  Most recent Hct 34  CAD:  risk given CRF/Age and need for AAT for PAF Myovue done 02/22/23 was low risk with small area of apical ischemia and preserved EF 62% Given age and lack of angina would not pursue left heart cath  PFTls/ DLCO TSH LFTls for amiodarone  CTA in am prior to dialysis day R/O PE ? Due in am before 11:00 dialysis at [facility] Refer to CHF clinic for possible right heart cath to further define PHT Suspect that increased CO from fistula can play a role.   F/U EP 6 months  F/U general cards in a year  Signed: [name] 01/10/2024, 2:22 PM

## 2024-01-08 ENCOUNTER — Ambulatory Visit (INDEPENDENT_AMBULATORY_CARE_PROVIDER_SITE_OTHER): Admitting: Neurology

## 2024-01-08 ENCOUNTER — Encounter: Payer: Self-pay | Admitting: Neurology

## 2024-01-08 VITALS — BP 159/70 | HR 63 | Ht 72.0 in | Wt 163.0 lb

## 2024-01-08 DIAGNOSIS — G479 Sleep disorder, unspecified: Secondary | ICD-10-CM | POA: Diagnosis not present

## 2024-01-08 DIAGNOSIS — G20C Parkinsonism, unspecified: Secondary | ICD-10-CM | POA: Diagnosis not present

## 2024-01-08 DIAGNOSIS — I4819 Other persistent atrial fibrillation: Secondary | ICD-10-CM

## 2024-01-08 DIAGNOSIS — Z9189 Other specified personal risk factors, not elsewhere classified: Secondary | ICD-10-CM | POA: Diagnosis not present

## 2024-01-08 DIAGNOSIS — R413 Other amnesia: Secondary | ICD-10-CM

## 2024-01-08 DIAGNOSIS — R011 Cardiac murmur, unspecified: Secondary | ICD-10-CM | POA: Diagnosis not present

## 2024-01-08 NOTE — Patient Instructions (Signed)
 We will do a brain scan, called MRI and call you with the test results. We will have to schedule you for this on a separate date. This test requires authorization from your insurance, and we will take care of the insurance process. We will monitor your exam for Parkinson's disease.  We will not start any new medications quite yet.  We will also proceed with a sleep study to see if you have evidence of sleep apnea and consider treatment accordingly. I recommend that you get your thyroid  function rechecked with the next blood work with your primary care and also get your vitamin B12 and vitamin D  checked at the time.  We will plan a follow-up in about 3 months.

## 2024-01-08 NOTE — Progress Notes (Addendum)
 Subjective:    Patient ID: Glenn Olson is a 83 y.o. male.  HPI    True Mar, MD, PhD [facility] [address], [address] [address] [place], [place] [address]  Dear Glenn Olson,  I saw your patient, Glenn Olson, upon your kind request in my neurologic clinic today for evaluation of his memory loss.  The patient is accompanied by his son Glenn Olson today.  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  Symptoms have been ongoing for about 6 months per patient and his son reports that forgetfulness has been going on for about 6 to 12 months.  His son feels that his memory issues started when he came off of hemodialysis.  He was off of hemodialysis for about 13 months before he restarted about a year ago, he goes Tuesdays, Thursdays and Saturdays.  He does have a suprapubic catheter but barely makes any urine.  He does not sleep very well but is not known to snore, has never had a sleep study.  He had a recent echocardiogram and has other testing through cardiology underway as I understand.  He has no family history of tremor or memory loss or Parkinson's disease.  He has a right hand tremor which patient is not aware of or has not really noticed, his son has noticed it.  It is a right hand resting tremor and some noticed it for the past several months.  Patient drinks caffeine in the form of coffee, about 2 cups in the mornings.  He walks with a 4-prong cane.  He denies any strokelike symptoms lately and does not drink any alcohol.  He does not smoke cigarettes.  He lives with his son and daughter-in-law.  He has a daughter in [place] .  I reviewed your office note from 08/23/2023.  His MMSE was reported to  be 25 at the time.  He had a head CT without contrast on 05/30/2022 with indication of acute onset headache, I reviewed the results:  IMPRESSION: No acute intracranial abnormality.   Mild cerebral atrophy.   Left maxillary sinus disease.   In addition, I personally and independently reviewed images through the PACS system.  His Past Medical History Is Significant For: Past Medical History:  Diagnosis Date   Anemia    Cancer (HCC)    Chronic kidney disease    Dysrhythmia    PAF 05/30/22   GERD (gastroesophageal reflux disease)    Neurogenic bladder    Renal insufficiency     His Past Surgical History Is Significant For: Past Surgical History:  Procedure Laterality Date   A/V SHUNT INTERVENTION Left 07/17/2023   Procedure: A/V SHUNT INTERVENTION;  Surgeon: Pearline Norman RAMAN, MD;  Location: [facility];  Service: Cardiovascular;  Laterality: Left;   AV FISTULA PLACEMENT Left 09/27/2020   Procedure: LEFT ARM ARTERIOVENOUS GRAFT PLACEMENT USING GORE-TEX 4-7MM X 45CM  VASCULAR GRAFT;  Surgeon: [name], MD;  Location: [facility];  Service: Vascular;  Laterality: Left;   AV FISTULA PLACEMENT Left 01/11/2023   Procedure: LEFT ARM ARTERIOVENOUS (AV) FISTULA CREATION;  Surgeon: [name] Penne Bruckner, MD;  Location: [facility] OR;  Service: Vascular;  Laterality: Left;   CAPD INSERTION N/A 03/17/2021   Procedure: ATTEMPTED INSERTION OF LAPAROSCOPIC CONTINUOUS AMBULATORY PERITONEAL DIALYSIS (  CAPD) CATHETER;  Surgeon: [name] Penne Bruckner, MD;  Location: [facility] OR;  Service: Vascular;  Laterality: N/A;   CARDIOVERSION N/A 06/19/2022   Procedure: CARDIOVERSION;  Surgeon: [name], MD;  Location: [facility] INVASIVE CV LAB;  Service: Cardiovascular;  Laterality: N/A;   CYSTOURETHROSCOPY     INSERTION OF DIALYSIS CATHETER Right 09/27/2020   Procedure: INSERTION OF PALINDROME TUNNELED DIALYSIS CATHETER 14.45f X 23CM;  Surgeon: [name], MD;  Location: [facility];  Service: Vascular;  Laterality:  Right;   INSERTION OF DIALYSIS CATHETER Right 05/31/2022   Procedure: INSERTION OF DIALYSIS CATHETER;  Surgeon: [name], MD;  Location: [facility] OR;  Service: Vascular;  Laterality: Right;   IR REMOVAL TUN CV CATH W/O FL  06/20/2022   IR REMOVAL TUN CV CATH W/O FL  11/25/2023   REMOVAL OF A DIALYSIS CATHETER N/A 12/06/2020   Procedure: MINOR REMOVAL OF A DIALYSIS CATHETER;  Surgeon: [name], MD;  Location: [facility];  Service: Vascular;  Laterality: N/A;   SUPRAPUBIC CATHETER PLACEMENT     TEE WITHOUT CARDIOVERSION N/A 06/19/2022   Procedure: TRANSESOPHAGEAL ECHOCARDIOGRAM;  Surgeon: [name], MD;  Location: [facility] INVASIVE CV LAB;  Service: Cardiovascular;  Laterality: N/A;   VENOUS ANGIOPLASTY  07/17/2023   Procedure: VENOUS ANGIOPLASTY;  Surgeon: Pearline Norman RAMAN, MD;  Location: [facility];  Service: Cardiovascular;;   VENOUS STENT  07/17/2023   Procedure: VENOUS STENT;  Surgeon: Pearline Norman RAMAN, MD;  Location: [facility];  Service: Cardiovascular;;    His Family History Is Significant For: Family History  Problem Relation Age of Onset   Alzheimer's disease Neg Hx    Dementia Neg Hx     His Social History Is Significant For: Social History   Socioeconomic History   Marital status: Single    Spouse name: Not on file   Number of children: Not on file   Years of education: Not on file   Highest education level: Not on file  Occupational History   Not on file  Tobacco Use   Smoking status: Never   Smokeless tobacco: Never   Tobacco comments:    Never smoked 09/26/22  Vaping Use   Vaping status: Never Used  Substance and Sexual Activity   Alcohol use: Yes    Comment: patient states he is a social drinker   Drug use: No   Sexual activity: Not on file  Other Topics Concern   Not on file  Social History Narrative   Dialysis on T-Th-Sat   Lives with family '   Retired    Social Drivers of Corporate Investment Banker Strain: Not on file  Food Insecurity: No  Food Insecurity (06/18/2022)   Hunger Vital Sign    Worried About Running Out of Food in the Last Year: Never true    Ran Out of Food in the Last Year: Never true  Transportation Needs: No Transportation Needs (06/18/2022)   PRAPARE - Administrator, Civil Service (Medical): No    Lack of Transportation (Non-Medical): No  Physical Activity: Not on file  Stress: Not on file  Social Connections: Not on file    His Allergies Are:  No Known Allergies:   His Current Medications Are:  Outpatient Encounter Medications as of 01/08/2024  Medication Sig   amiodarone  (PACERONE ) 200 MG tablet TAKE 1 TABLET DAILY   apixaban  (ELIQUIS ) 2.5 MG TABS tablet Take 1 tablet by mouth twice daily   atorvastatin  (LIPITOR)  20 MG tablet Take 1 tablet (20 mg total) by mouth daily.   calcitRIOL  (ROCALTROL ) 0.25 MCG capsule Take 0.25 mcg by mouth 3 (three) times a week.   fluticasone (FLONASE) 50 MCG/ACT nasal spray Place 2 sprays into both nostrils daily.   Levothyroxine  Sodium 112 MCG CAPS Take 1 capsule by mouth every morning.   lidocaine -prilocaine  (EMLA ) cream Apply 1 Application topically as needed.   loratadine  (CLARITIN ) 10 MG tablet Take 1 tablet (10 mg total) by mouth daily.   melatonin 3 MG TABS tablet Take 1 tablet (3 mg total) by mouth at bedtime as needed.   metoprolol  succinate (TOPROL  XL) 25 MG 24 hr tablet Take 0.5 tablets (12.5 mg total) by mouth daily.   VELPHORO 500 MG chewable tablet Chew 500 mg by mouth 3 (three) times daily.   No facility-administered encounter medications on file as of 01/08/2024.  :   Review of Systems:  Out of a complete 14 point review of systems, all are reviewed and negative with the exception of these symptoms as listed below:   Review of Systems  Objective:  Neurological Exam  Physical Exam Physical Examination:   Vitals:   01/08/24 1513 01/08/24 1523  BP: (!) 164/74 (!) 159/70  Pulse: 68 63    General Examination: The patient is a very  pleasant 83 y.o. male in no acute distress.  He appears frail and chronically ill.   HEENT: Normocephalic, atraumatic, pupils are equal, round and reactive to light, extraocular tracking is good without limitation to gaze excursion or nystagmus noted. No photophobia.  Corrective eye glasses in place. Hearing is grossly intact.  Face is symmetric with possibly mild facial masking, mild nuchal rigidity is noted, speech is hypophonic and mildly raspy, otherwise no dysarthria. Airway/Oropharynx exam reveals: moderate mouth dryness, adequate dental hygiene with full dentures in place, moderate airway crowding secondary to larger uvula and redundant soft palate, tonsils are absent, Mallampati class III.  Tongue protrudes centrally and palate elevates symmetrically.   Chest: Clear to auscultation without wheezing, rhonchi or crackles noted.  Heart: S1+S2+0, mildly irregular and loud pansystolic heart murmur noted.    Abdomen: Soft, non-tender and non-distended.  Extremities: There is no pitting edema in the distal lower extremities bilaterally.  Left upper extremity HD fistula.  Skin: Warm and dry without trophic changes noted.   Musculoskeletal: exam reveals no obvious joint deformities.   Neurologically:  Mental status: The patient is awake, alert and pays attention, answers simple questions appropriately.  History is supplemented by his son. Mood is constricted and affect is blunted.      01/08/2024    3:16 PM  MMSE - Mini Mental State Exam  Orientation to time 4  Orientation to Place 4  Registration 3  Attention/ Calculation 1  Recall 0  Language- name 2 objects 2  Language- repeat 1  Language- follow 3 step command 3  Language- read & follow direction 1  Write a sentence 1  Copy design 0  Total score 20   On 01/08/2024: CDT: 1/4, AFT: 6/min.  Cranial nerves II - XII are as described above under HEENT exam.  Motor exam: Thin bulk, global strength of about 4 out of 5, no telltale  cogwheeling noted.  He has an intermittent mild resting tremor in the right hand only.    Fine motor skills and coordination: Impaired finger taps, hand movements and rapid alternating patting with the upper extremities and impaired foot taps bilaterally, right side worse than left.  Cerebellar testing: No dysmetria or intention tremor. There is no truncal or gait ataxia.  Sensory exam: intact to light touch in the upper and lower extremities.  Gait, station and balance: He stands with difficulty and pushes himself up.  He can walk a little bit without his 4-prong cane and has a stooped posture, walks slowly and cautiously, almost with a mild shuffle, decreased arm swing bilaterally.    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  He is at risk for vascular dementia.  His MMSE is 20 out of 30 today.  He is noted to have an intermittent right upper extremity resting tremor and mild signs of parkinsonism.  It does not look like he has been on Reglan or any other medication that could cause drug-induced parkinsonism.  We will continue to monitor his exam.  We mutually agreed not to start any new medications quite yet.  I would like to proceed with a brain MRI and a sleep study as he could be at risk for OSA.  We talked about vascular risk factors at length today.  He has had some fluctuation of his TSH and adjustment of his levothyroxine  as I understand.  With the next blood work I recommend that he check his vitamin B12 level, TSH again, vitamin D  level as well.   We will plan a follow-up in about 3 months.  I answered all the questions today and the patient and his son were in  agreement.  This was an extended visit of over 60 minutes with copious record review involved, interpretation of memory scores, considerable counseling and coordination of care, and addressing multiple issues.   Thank you very much for allowing me to participate in the care of this nice patient. If I can be of any further assistance to you please do not hesitate to call me at [phone].  Sincerely,   True Mar, MD, PhD

## 2024-01-08 NOTE — Progress Notes (Signed)
 f

## 2024-01-09 ENCOUNTER — Telehealth: Payer: Self-pay | Admitting: Neurology

## 2024-01-09 NOTE — Telephone Encounter (Signed)
 no auth required sent to GI [phone]

## 2024-01-10 ENCOUNTER — Ambulatory Visit: Attending: Cardiovascular Disease | Admitting: Cardiovascular Disease

## 2024-01-10 ENCOUNTER — Encounter: Payer: Self-pay | Admitting: Cardiovascular Disease

## 2024-01-10 VITALS — BP 142/62 | HR 70 | Ht 72.0 in | Wt 157.6 lb

## 2024-01-10 DIAGNOSIS — I483 Typical atrial flutter: Secondary | ICD-10-CM

## 2024-01-10 DIAGNOSIS — N186 End stage renal disease: Secondary | ICD-10-CM

## 2024-01-10 DIAGNOSIS — R011 Cardiac murmur, unspecified: Secondary | ICD-10-CM

## 2024-01-10 DIAGNOSIS — I4891 Unspecified atrial fibrillation: Secondary | ICD-10-CM | POA: Diagnosis not present

## 2024-01-10 DIAGNOSIS — I272 Pulmonary hypertension, unspecified: Secondary | ICD-10-CM

## 2024-01-10 DIAGNOSIS — I4819 Other persistent atrial fibrillation: Secondary | ICD-10-CM

## 2024-01-10 DIAGNOSIS — I2699 Other pulmonary embolism without acute cor pulmonale: Secondary | ICD-10-CM | POA: Diagnosis not present

## 2024-01-10 NOTE — Patient Instructions (Addendum)
 Medication Instructions:  Your physician recommends that you continue on your current medications as directed. Please refer to the Current Medication list given to you today.  *If you need a refill on your cardiac medications before your next appointment, please call your pharmacy*   Labs TSH, LFTs  Testing/Procedures: CT-scan of the chest    Follow-Up: At [facility], you and your health needs are our priority.  As part of our continuing mission to provide you with exceptional heart care, our providers are all part of one team.  This team includes your primary Cardiologist (physician) and Advanced Practice Providers or APPs (Physician Assistants and Nurse Practitioners) who all work together to provide you with the care you need, when you need it.  Your next appointment:   6 month(s)  Provider:   You will follow up in the Atrial Fibrillation Clinic located at [facility]. Your provider will be: Clint R. Fenton, PA-C  Then 1 year with Dr. Delford    Referral sent to Heart Failure Clinic

## 2024-01-11 LAB — HEPATIC FUNCTION PANEL
ALT: 6 IU/L (ref 0–44)
AST: 13 IU/L (ref 0–40)
Albumin: 4.4 g/dL (ref 3.7–4.7)
Alkaline Phosphatase: 59 IU/L (ref 48–129)
Bilirubin Total: 0.4 mg/dL (ref 0.0–1.2)
Bilirubin, Direct: 0.17 mg/dL (ref 0.00–0.40)
Total Protein: 7.1 g/dL (ref 6.0–8.5)

## 2024-01-11 LAB — TSH: TSH: 15.5 u[IU]/mL — ABNORMAL HIGH (ref 0.450–4.500)

## 2024-01-12 ENCOUNTER — Ambulatory Visit: Payer: Self-pay | Admitting: Cardiovascular Disease

## 2024-01-12 DIAGNOSIS — R918 Other nonspecific abnormal finding of lung field: Secondary | ICD-10-CM

## 2024-01-13 ENCOUNTER — Ambulatory Visit

## 2024-01-14 MED ORDER — LEVOTHYROXINE SODIUM 125 MCG PO TABS: 125.0000 ug | ORAL_TABLET | Freq: Every day | ORAL | 0 refills | Status: DC

## 2024-01-16 ENCOUNTER — Other Ambulatory Visit: Payer: Self-pay

## 2024-01-17 MED ORDER — METOPROLOL SUCCINATE ER 25 MG PO TB24: 12.5000 mg | ORAL_TABLET | Freq: Every day | ORAL | 3 refills | Status: AC

## 2024-01-21 ENCOUNTER — Other Ambulatory Visit: Payer: Self-pay | Admitting: Cardiovascular Disease

## 2024-01-24 ENCOUNTER — Other Ambulatory Visit (HOSPITAL_COMMUNITY): Payer: Self-pay | Admitting: Physician Assistant

## 2024-01-27 ENCOUNTER — Other Ambulatory Visit: Payer: Self-pay | Admitting: Cardiovascular Disease

## 2024-01-28 ENCOUNTER — Ambulatory Visit (HOSPITAL_BASED_OUTPATIENT_CLINIC_OR_DEPARTMENT_OTHER)

## 2024-01-29 MED ORDER — LEVOTHYROXINE SODIUM 125 MCG PO TABS: 125.0000 ug | ORAL_TABLET | Freq: Every day | ORAL | 3 refills | Status: AC

## 2024-02-06 ENCOUNTER — Encounter (HOSPITAL_BASED_OUTPATIENT_CLINIC_OR_DEPARTMENT_OTHER): Payer: Self-pay

## 2024-02-06 ENCOUNTER — Ambulatory Visit (HOSPITAL_BASED_OUTPATIENT_CLINIC_OR_DEPARTMENT_OTHER)
Admission: RE | Admit: 2024-02-06 | Discharge: 2024-02-06 | Disposition: A | Discharge status: Home / Self Care | Source: Ambulatory Visit | Attending: Cardiovascular Disease | Admitting: Cardiovascular Disease

## 2024-02-06 DIAGNOSIS — I272 Pulmonary hypertension, unspecified: Secondary | ICD-10-CM | POA: Diagnosis present

## 2024-02-06 MED ORDER — IOHEXOL 350 MG/ML SOLN
100.0000 mL | Freq: Once | INTRAVENOUS | Status: AC | PRN
  Administered 2024-02-06: 75 mL via INTRAVENOUS

## 2024-02-07 ENCOUNTER — Other Ambulatory Visit: Payer: Self-pay

## 2024-02-07 ENCOUNTER — Encounter (HOSPITAL_COMMUNITY)
Admission: RE | Disposition: A | Discharge status: Home / Self Care | Payer: Self-pay | Source: Home / Self Care | Attending: Vascular Surgery

## 2024-02-07 ENCOUNTER — Ambulatory Visit (HOSPITAL_COMMUNITY)
Admission: RE | Admit: 2024-02-07 | Discharge: 2024-02-07 | Disposition: A | Discharge status: Home / Self Care | Attending: Vascular Surgery | Admitting: Vascular Surgery

## 2024-02-07 DIAGNOSIS — Y832 Surgical operation with anastomosis, bypass or graft as the cause of abnormal reaction of the patient, or of later complication, without mention of misadventure at the time of the procedure: Secondary | ICD-10-CM | POA: Insufficient documentation

## 2024-02-07 DIAGNOSIS — T82858A Stenosis of vascular prosthetic devices, implants and grafts, initial encounter: Secondary | ICD-10-CM | POA: Insufficient documentation

## 2024-02-07 DIAGNOSIS — N186 End stage renal disease: Secondary | ICD-10-CM | POA: Insufficient documentation

## 2024-02-07 DIAGNOSIS — Z992 Dependence on renal dialysis: Secondary | ICD-10-CM | POA: Insufficient documentation

## 2024-02-07 HISTORY — PX: VENOUS ANGIOPLASTY: CATH118376

## 2024-02-07 HISTORY — PX: A/V FISTULAGRAM: CATH118298

## 2024-02-07 SURGERY — A/V FISTULAGRAM
Anesthesia: LOCAL | Site: Arm Upper | Laterality: Left

## 2024-02-07 MED ORDER — HEPARIN (PORCINE) IN NACL 1000-0.9 UT/500ML-% IV SOLN
INTRAVENOUS | Status: DC | PRN
  Administered 2024-02-07: 500 mL

## 2024-02-07 MED ORDER — IODIXANOL 320 MG/ML IV SOLN
INTRAVENOUS | Status: DC | PRN
  Administered 2024-02-07: 30 mL via INTRAVENOUS

## 2024-02-07 MED ORDER — LIDOCAINE HCL (PF) 1 % IJ SOLN
INTRAMUSCULAR | Status: DC | PRN
  Administered 2024-02-07: 2 mL via SUBCUTANEOUS

## 2024-02-07 MED ORDER — LIDOCAINE HCL (PF) 1 % IJ SOLN
INTRAMUSCULAR | Status: AC
  Filled 2024-02-07: qty 30

## 2024-02-07 SURGICAL SUPPLY — 10 items
BALLOON MUSTANG 7X60X75 (BALLOONS) IMPLANT
BALLOON MUSTANG 8X100X75 (BALLOONS) IMPLANT
DEVICE INFLATION ENCORE 26 (MISCELLANEOUS) IMPLANT
KIT MICROPUNCTURE NIT STIFF (SHEATH) IMPLANT
SHEATH PINNACLE R/O II 6F 4CM (SHEATH) IMPLANT
SHEATH PROBE COVER 6X72 (BAG) IMPLANT
STOPCOCK MORSE 400PSI 3WAY (MISCELLANEOUS) IMPLANT
TRAY PV CATH (CUSTOM PROCEDURE TRAY) ×2 IMPLANT
TUBING CIL FLEX 10 FLL-RA (TUBING) IMPLANT
WIRE BENTSON .035X145CM (WIRE) IMPLANT

## 2024-02-07 NOTE — H&P (Signed)
 " H&P     History of Present Illness: This is a 84 y.o. male with end-stage renal disease that presents for left arm fistulogram due to malfunction of AV fistula.  Last underwent stenting of his left arm cephalic arch on 3/81/7974 by Dr. Pearline with a 7 mm Viabhan.  Has a left brachiocephalic fistula created on 01/11/2023 by Dr. [name].  States they have had trouble in dialysis.  Past Medical History:  Diagnosis Date   Anemia    Cancer (HCC)    Chronic kidney disease    Dysrhythmia    PAF 05/30/22   GERD (gastroesophageal reflux disease)    Neurogenic bladder    Renal insufficiency     Past Surgical History:  Procedure Laterality Date   A/V SHUNT INTERVENTION Left 07/17/2023   Procedure: A/V SHUNT INTERVENTION;  Surgeon: Pearline Norman RAMAN, MD;  Location: [facility];  Service: Cardiovascular;  Laterality: Left;   AV FISTULA PLACEMENT Left 09/27/2020   Procedure: LEFT ARM ARTERIOVENOUS GRAFT PLACEMENT USING GORE-TEX 4-7MM X 45CM  VASCULAR GRAFT;  Surgeon: [name], MD;  Location: [facility];  Service: Vascular;  Laterality: Left;   AV FISTULA PLACEMENT Left 01/11/2023   Procedure: LEFT ARM ARTERIOVENOUS (AV) FISTULA CREATION;  Surgeon: [name] Penne Bruckner, MD;  Location: [facility] OR;  Service: Vascular;  Laterality: Left;   CAPD INSERTION N/A 03/17/2021   Procedure: ATTEMPTED INSERTION OF LAPAROSCOPIC CONTINUOUS AMBULATORY PERITONEAL DIALYSIS (CAPD) CATHETER;  Surgeon: [name] Penne Bruckner, MD;  Location: [facility] OR;  Service: Vascular;  Laterality: N/A;   CARDIOVERSION N/A 06/19/2022   Procedure: CARDIOVERSION;  Surgeon: [name], MD;  Location: [facility] INVASIVE CV LAB;  Service: Cardiovascular;  Laterality: N/A;   CYSTOURETHROSCOPY     INSERTION OF DIALYSIS CATHETER Right 09/27/2020   Procedure: INSERTION OF PALINDROME TUNNELED DIALYSIS CATHETER 14.21f X 23CM;  Surgeon: [name], MD;  Location: [facility];  Service: Vascular;  Laterality: Right;   INSERTION OF DIALYSIS CATHETER  Right 05/31/2022   Procedure: INSERTION OF DIALYSIS CATHETER;  Surgeon: [name], MD;  Location: [facility] OR;  Service: Vascular;  Laterality: Right;   IR REMOVAL TUN CV CATH W/O FL  06/20/2022   IR REMOVAL TUN CV CATH W/O FL  11/25/2023   REMOVAL OF A DIALYSIS CATHETER N/A 12/06/2020   Procedure: MINOR REMOVAL OF A DIALYSIS CATHETER;  Surgeon: [name], MD;  Location: [facility];  Service: Vascular;  Laterality: N/A;   SUPRAPUBIC CATHETER PLACEMENT     TEE WITHOUT CARDIOVERSION N/A 06/19/2022   Procedure: TRANSESOPHAGEAL ECHOCARDIOGRAM;  Surgeon: [name], MD;  Location: [facility] INVASIVE CV LAB;  Service: Cardiovascular;  Laterality: N/A;   VENOUS ANGIOPLASTY  07/17/2023   Procedure: VENOUS ANGIOPLASTY;  Surgeon: Pearline Norman RAMAN, MD;  Location: [facility];  Service: Cardiovascular;;   VENOUS STENT  07/17/2023   Procedure: VENOUS STENT;  Surgeon: Pearline Norman RAMAN, MD;  Location: [facility];  Service: Cardiovascular;;    Allergies[1]  Prior to Admission medications  Medication Sig Start Date End Date Taking? Authorizing Provider  amiodarone  (PACERONE ) 200 MG tablet TAKE 1 TABLET DAILY 09/26/23   Fenton, Clint R, PA  atorvastatin  (LIPITOR) 20 MG tablet Take 1 tablet (20 mg total) by mouth daily. 09/16/23   Lucien Orren SAILOR, PA-C  calcitRIOL  (ROCALTROL ) 0.25 MCG capsule Take 0.25 mcg by mouth 3 (three) times a week. 06/20/23   [provider]  ELIQUIS  2.5 MG TABS tablet TAKE 1 TABLET TWICE A DAY  01/24/24   Fenton, Clint R, PA  fluticasone (FLONASE) 50 MCG/ACT nasal spray Place 2 sprays into both nostrils daily. 07/22/23   [provider]  levothyroxine  (SYNTHROID ) 125 MCG tablet Take 1 tablet (125 mcg total) by mouth daily before breakfast. 01/29/24   Nishan, Peter C, MD  lidocaine -prilocaine  (EMLA ) cream Apply 1 Application topically as needed. 10/03/23   [provider]  loratadine  (CLARITIN ) 10 MG tablet Take 1 tablet (10 mg total) by mouth daily. 06/29/22   Love,  Sharlet RAMAN, PA-C  melatonin 3 MG TABS tablet Take 1 tablet (3 mg total) by mouth at bedtime as needed. 06/28/22   Love, Sharlet RAMAN, PA-C  metoprolol  succinate (TOPROL  XL) 25 MG 24 hr tablet Take 0.5 tablets (12.5 mg total) by mouth daily. 01/17/24   Nishan, Peter C, MD  VELPHORO 500 MG chewable tablet Chew 500 mg by mouth 3 (three) times daily. 10/30/22   [provider]    Social History   Socioeconomic History   Marital status: Single    Spouse name: Not on file   Number of children: Not on file   Years of education: Not on file   Highest education level: Not on file  Occupational History   Not on file  Tobacco Use   Smoking status: Never   Smokeless tobacco: Never   Tobacco comments:    Never smoked 09/26/22  Vaping Use   Vaping status: Never Used  Substance and Sexual Activity   Alcohol use: Yes    Comment: patient states he is a social drinker   Drug use: No   Sexual activity: Not on file  Other Topics Concern   Not on file  Social History Narrative   Dialysis on T-Th-Sat   Lives with family '   Retired    Social Drivers of Health   Tobacco Use: Low Risk (01/10/2024)   Patient History    Smoking Tobacco Use: Never    Smokeless Tobacco Use: Never    Passive Exposure: Not on file  Financial Resource Strain: Not on file  Food Insecurity: No Food Insecurity (06/18/2022)   Hunger Vital Sign    Worried About Running Out of Food in the Last Year: Never true    Ran Out of Food in the Last Year: Never true  Transportation Needs: No Transportation Needs (06/18/2022)   PRAPARE - Administrator, Civil Service (Medical): No    Lack of Transportation (Non-Medical): No  Physical Activity: Not on file  Stress: Not on file  Social Connections: Not on file  Intimate Partner Violence: Not At Risk (06/18/2022)   Humiliation, Afraid, Rape, and Kick questionnaire    Fear of Current or Ex-Partner: No    Emotionally Abused: No    Physically Abused: No    Sexually  Abused: No  Depression (PHQ2-9): Not on file  Alcohol Screen: Not on file  Housing: Low Risk (06/18/2022)   Housing    Last Housing Risk Score: 0  Utilities: Not At Risk (06/18/2022)   AHC Utilities    Threatened with loss of utilities: No  Health Literacy: Not on file     Family History  Problem Relation Age of Onset   Alzheimer's disease Neg Hx    Dementia Neg Hx     ROS: [x]  Positive   [ ]  Negative   [ ]  All sytems reviewed and are negative  Cardiovascular: []  chest pain/pressure []  palpitations []  SOB lying flat []  DOE []  pain in legs  while walking []  pain in legs at rest []  pain in legs at night []  non-healing ulcers []  hx of DVT []  swelling in legs  Pulmonary: []  productive cough []  asthma/wheezing []  home O2  Neurologic: []  weakness in []  arms []  legs []  numbness in []  arms []  legs []  hx of CVA []  mini stroke [] difficulty speaking or slurred speech []  temporary loss of vision in one eye []  dizziness  Hematologic: []  hx of cancer []  bleeding problems []  problems with blood clotting easily  Endocrine:   []  diabetes []  thyroid  disease  GI []  vomiting blood []  blood in stool  GU: []  CKD/renal failure []  HD--[]  M/W/F or []  T/T/S []  burning with urination []  blood in urine  Psychiatric: []  anxiety []  depression  Musculoskeletal: []  arthritis []  joint pain  Integumentary: []  rashes []  ulcers  Constitutional: []  fever []  chills   Physical Examination  Vitals:   02/07/24 0800 02/07/24 0839  BP: (!) 167/76 (!) 153/83  Pulse: 66 64  Resp: 12 14  SpO2: 97% 97%   There is no height or weight on file to calculate BMI.  General:  WDWN in NAD Gait: Not observed HENT: WNL, normocephalic Pulmonary: normal non-labored breathing Cardiac: regular, without  Murmurs, rubs or gallops Abdomen:  soft, NT/ND Vascular Exam/Pulses: Left arm fistula with thrill with some slight pulsatility    CBC    Component Value Date/Time   WBC 4.9  08/14/2023 1554   RBC 3.51 (L) 08/14/2023 1554   HGB 11.2 (L) 08/14/2023 1554   HCT 34.0 (L) 08/14/2023 1554   PLT 152 08/14/2023 1554   MCV 96.9 08/14/2023 1554   MCH 31.9 08/14/2023 1554   MCHC 32.9 08/14/2023 1554   RDW 15.2 08/14/2023 1554   LYMPHSABS 1.0 08/14/2023 1554   MONOABS 0.4 08/14/2023 1554   EOSABS 0.2 08/14/2023 1554   BASOSABS 0.1 08/14/2023 1554    BMET    Component Value Date/Time   NA 136 08/14/2023 1554   K 3.3 (L) 08/14/2023 1554   CL 96 (L) 08/14/2023 1554   CO2 27 08/14/2023 1554   GLUCOSE 93 08/14/2023 1554   BUN 29 (H) 08/14/2023 1554   CREATININE 5.75 (H) 08/14/2023 1554   CALCIUM  9.0 08/14/2023 1554   GFRNONAA 9 (L) 08/14/2023 1554    COAGS: Lab Results  Component Value Date   INR 1.5 (H) 04/03/2023   INR 1.2 01/25/2023   INR 1.4 (H) 06/18/2022     Non-Invasive Vascular Imaging:      ASSESSMENT/PLAN: This is a 84 y.o. male  with end-stage renal disease that presents for left arm fistulogram due to malfunction of AV fistula.  Last underwent stenting of his left arm cephalic arch on 3/81/7974 by Dr. Pearline.  Has a left brachiocephalic fistula created on 01/11/2023 by Dr. [name].  Discussed plan for left arm fistulogram with possible intervention including angioplasty and stent.  All questions answered  Lonni DOROTHA Gaskins, MD [facility] Office: [phone]  Lonni JINNY Gaskins     [1] No Known Allergies  "

## 2024-02-07 NOTE — Op Note (Signed)
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  Ultrasound-guided access left arm AV fistula 2.  Left upper extremity fistulogram including central venogram 3.  Left peripheral angioplasty of cephalic arch adjacent to the cephalic arch stent (7 mm and 8 mm Mustang)  Indications: Patient is a 84 year old male with end-stage renal disease using a left brachiocephalic AV fistula that has had prior stenting of the cephalic arch.  He presents for left arm fistulogram with possible intervention after risk-benefits discussed.  Findings:   The left upper arm AV fistula was patent including the cephalic arch stent.  There was about a 60% stenosis at the edge of the stent in the proximal upper arm that was treated with a 7 mm and 8 mm Mustang.  Widely patent at completion with excellent thrill.   Procedure:  The patient was identified in the holding area and taken to [facility] lab.  Placed on the table in supine position.  Left arm was prepped draped standard sterile fashion.  Timeout was performed.  Used 1% lidocaine  without epinephrine  to put in the skin.  We then used ultrasound to access the fistula with micro access needle, placed a microwire, and a micro sheath.  We then got a left upper extremity fistulogram.  Findings are noted above.  Elected for intervention used a Holiday representative and exchanged for a short 6 French sheath.  The cephalic arch stent was crossed with wire aceess.  I used a 7 mm x 60 Mustang to treat the edge of stent stenosis and then I upsized to an 8 mm x 80 mm Mustang and treated the stent itself as well as the edge of stent stenosis.  Much better thrill.  No significant residual stenosis.  Reflux shot looked good.  Wires and catheters were removed and a 4-0 Monocryl  pursestring was tied around the sheath and removed.    Lonni DOROTHA Gaskins, MD [facility] Office: [phone]   "

## 2024-02-10 ENCOUNTER — Encounter (HOSPITAL_COMMUNITY): Payer: Self-pay | Admitting: Vascular Surgery

## 2024-02-11 ENCOUNTER — Encounter: Payer: Self-pay | Admitting: General Practice

## 2024-02-14 ENCOUNTER — Ambulatory Visit

## 2024-02-14 ENCOUNTER — Telehealth: Payer: Self-pay

## 2024-02-14 NOTE — Telephone Encounter (Signed)
 Called patient to scheduled monthly SP Tube. Pt scheduled and confirmed.

## 2024-02-17 ENCOUNTER — Ambulatory Visit

## 2024-02-17 ENCOUNTER — Other Ambulatory Visit: Payer: Self-pay

## 2024-02-17 DIAGNOSIS — R339 Retention of urine, unspecified: Secondary | ICD-10-CM | POA: Diagnosis not present

## 2024-02-17 DIAGNOSIS — N312 Flaccid neuropathic bladder, not elsewhere classified: Secondary | ICD-10-CM

## 2024-02-17 MED ORDER — CIPROFLOXACIN HCL 500 MG PO TABS
500.0000 mg | ORAL_TABLET | Freq: Once | ORAL | Status: AC
  Administered 2024-02-17: 500 mg via ORAL

## 2024-02-17 NOTE — Progress Notes (Signed)
 Suprapubic Cath Change  Patient is present today for a suprapubic catheter change due to urinary retention.  10 ml of water  was drained from the balloon, a 16 FR foley cath was removed from the tract with out difficulty.  Suprapubic catheter site was cleaned and prepped in a sterile fashion with [id number]  A 16 FR foley cath was replaced into the tract no complications were noted. Urine return was noted, urine Dark yellow in color . 10 ml of sterile water  was inflated into the balloon and a cath was cap per Dr. Hallie order, drainage.  Patient tolerated well. A night bag was given to patient and proper instruction was given on how to switch bags.    Performed by: Carlos, CMA  Follow up: 4 weeks Cath change

## 2024-03-23 ENCOUNTER — Ambulatory Visit

## 2024-03-27 ENCOUNTER — Ambulatory Visit: Admitting: Internal Medicine

## 2024-04-28 ENCOUNTER — Ambulatory Visit: Admitting: Neurology

## 2024-04-30 ENCOUNTER — Ambulatory Visit (HOSPITAL_COMMUNITY): Admitting: Physician Assistant

## 2024-10-19 ENCOUNTER — Ambulatory Visit: Admitting: Urology
